# Patient Record
Sex: Male | Born: 1935 | Race: White | Hispanic: No | Marital: Married | Smoking: Former smoker
Health system: Southern US, Community
[De-identification: ages and names within clinical notes are randomized; demographics above are authoritative.]

## PROBLEM LIST (undated history)

## (undated) DIAGNOSIS — C61 Malignant neoplasm of prostate: Secondary | ICD-10-CM

## (undated) DIAGNOSIS — C801 Malignant (primary) neoplasm, unspecified: Secondary | ICD-10-CM

## (undated) DIAGNOSIS — E785 Hyperlipidemia, unspecified: Secondary | ICD-10-CM

## (undated) DIAGNOSIS — I209 Angina pectoris, unspecified: Secondary | ICD-10-CM

## (undated) DIAGNOSIS — C3490 Malignant neoplasm of unspecified part of unspecified bronchus or lung: Secondary | ICD-10-CM

## (undated) HISTORY — PX: BACK SURGERY: SHX140

## (undated) HISTORY — PX: PROSTATE SURGERY: SHX751

## (undated) HISTORY — PX: OTHER SURGICAL HISTORY: SHX169

## (undated) HISTORY — PX: CARDIAC SURGERY: SHX584

## (undated) HISTORY — PX: TUMOR REMOVAL: SHX12

---

## 1997-10-02 ENCOUNTER — Emergency Department (HOSPITAL_COMMUNITY): Admission: EM | Admit: 1997-10-02 | Discharge: 1997-10-02 | Payer: Self-pay | Admitting: Emergency Medicine

## 1997-10-02 ENCOUNTER — Encounter: Payer: Self-pay | Admitting: Emergency Medicine

## 1999-08-07 ENCOUNTER — Ambulatory Visit: Admission: RE | Admit: 1999-08-07 | Discharge: 1999-08-07 | Payer: Self-pay | Admitting: Internal Medicine

## 1999-08-07 ENCOUNTER — Encounter (INDEPENDENT_AMBULATORY_CARE_PROVIDER_SITE_OTHER): Payer: Self-pay | Admitting: Specialist

## 1999-08-17 ENCOUNTER — Ambulatory Visit (HOSPITAL_COMMUNITY): Admission: RE | Admit: 1999-08-17 | Discharge: 1999-08-17 | Payer: Self-pay | Admitting: Internal Medicine

## 1999-08-17 ENCOUNTER — Encounter (INDEPENDENT_AMBULATORY_CARE_PROVIDER_SITE_OTHER): Payer: Self-pay | Admitting: Specialist

## 1999-08-29 ENCOUNTER — Encounter: Payer: Self-pay | Admitting: Surgery

## 1999-08-29 ENCOUNTER — Ambulatory Visit (HOSPITAL_COMMUNITY): Admission: RE | Admit: 1999-08-29 | Discharge: 1999-08-29 | Payer: Self-pay | Admitting: Surgery

## 1999-09-03 ENCOUNTER — Encounter: Payer: Self-pay | Admitting: Surgery

## 1999-09-05 ENCOUNTER — Encounter (INDEPENDENT_AMBULATORY_CARE_PROVIDER_SITE_OTHER): Payer: Self-pay | Admitting: *Deleted

## 1999-09-05 ENCOUNTER — Encounter: Payer: Self-pay | Admitting: Surgery

## 1999-09-05 ENCOUNTER — Inpatient Hospital Stay (HOSPITAL_COMMUNITY): Admission: RE | Admit: 1999-09-05 | Discharge: 1999-09-12 | Payer: Self-pay | Admitting: Surgery

## 1999-09-06 ENCOUNTER — Encounter: Payer: Self-pay | Admitting: Surgery

## 1999-09-07 ENCOUNTER — Encounter: Payer: Self-pay | Admitting: Surgery

## 1999-09-08 ENCOUNTER — Encounter: Payer: Self-pay | Admitting: Surgery

## 1999-09-09 ENCOUNTER — Encounter: Payer: Self-pay | Admitting: Surgery

## 1999-09-10 ENCOUNTER — Encounter: Payer: Self-pay | Admitting: Surgery

## 1999-09-10 ENCOUNTER — Encounter: Payer: Self-pay | Admitting: Thoracic Surgery (Cardiothoracic Vascular Surgery)

## 1999-09-11 ENCOUNTER — Encounter: Payer: Self-pay | Admitting: Surgery

## 1999-09-18 ENCOUNTER — Encounter: Admission: RE | Admit: 1999-09-18 | Discharge: 1999-09-18 | Payer: Self-pay | Admitting: Surgery

## 1999-09-18 ENCOUNTER — Encounter: Payer: Self-pay | Admitting: Surgery

## 1999-10-09 ENCOUNTER — Encounter: Payer: Self-pay | Admitting: Surgery

## 1999-10-09 ENCOUNTER — Encounter: Admission: RE | Admit: 1999-10-09 | Discharge: 1999-10-09 | Payer: Self-pay | Admitting: Surgery

## 2000-01-22 ENCOUNTER — Encounter: Admission: RE | Admit: 2000-01-22 | Discharge: 2000-01-22 | Payer: Self-pay | Admitting: Surgery

## 2000-01-22 ENCOUNTER — Encounter: Payer: Self-pay | Admitting: Surgery

## 2000-02-14 ENCOUNTER — Encounter (INDEPENDENT_AMBULATORY_CARE_PROVIDER_SITE_OTHER): Payer: Self-pay | Admitting: Specialist

## 2000-02-14 ENCOUNTER — Encounter: Payer: Self-pay | Admitting: Emergency Medicine

## 2000-02-15 ENCOUNTER — Inpatient Hospital Stay (HOSPITAL_COMMUNITY): Admission: EM | Admit: 2000-02-15 | Discharge: 2000-02-19 | Payer: Self-pay | Admitting: Emergency Medicine

## 2000-02-15 ENCOUNTER — Encounter: Payer: Self-pay | Admitting: General Surgery

## 2000-03-19 ENCOUNTER — Encounter: Payer: Self-pay | Admitting: Internal Medicine

## 2000-03-19 ENCOUNTER — Ambulatory Visit (HOSPITAL_COMMUNITY): Admission: RE | Admit: 2000-03-19 | Discharge: 2000-03-19 | Payer: Self-pay | Admitting: Internal Medicine

## 2000-05-19 ENCOUNTER — Encounter: Payer: Self-pay | Admitting: Family Medicine

## 2000-05-19 ENCOUNTER — Ambulatory Visit (HOSPITAL_COMMUNITY): Admission: RE | Admit: 2000-05-19 | Discharge: 2000-05-19 | Payer: Self-pay | Admitting: Family Medicine

## 2000-07-23 ENCOUNTER — Encounter: Admission: RE | Admit: 2000-07-23 | Discharge: 2000-07-23 | Payer: Self-pay | Admitting: Surgery

## 2000-07-23 ENCOUNTER — Encounter: Payer: Self-pay | Admitting: Surgery

## 2000-09-05 ENCOUNTER — Encounter: Payer: Self-pay | Admitting: *Deleted

## 2000-09-05 ENCOUNTER — Emergency Department (HOSPITAL_COMMUNITY): Admission: EM | Admit: 2000-09-05 | Discharge: 2000-09-05 | Payer: Self-pay | Admitting: Emergency Medicine

## 2000-12-23 ENCOUNTER — Encounter: Payer: Self-pay | Admitting: Surgery

## 2000-12-23 ENCOUNTER — Encounter: Admission: RE | Admit: 2000-12-23 | Discharge: 2000-12-23 | Payer: Self-pay | Admitting: Surgery

## 2001-04-28 ENCOUNTER — Encounter: Payer: Self-pay | Admitting: Surgery

## 2001-04-28 ENCOUNTER — Encounter: Admission: RE | Admit: 2001-04-28 | Discharge: 2001-04-28 | Payer: Self-pay | Admitting: Surgery

## 2001-09-22 ENCOUNTER — Encounter: Admission: RE | Admit: 2001-09-22 | Discharge: 2001-09-22 | Payer: Self-pay | Admitting: Surgery

## 2001-09-22 ENCOUNTER — Encounter: Payer: Self-pay | Admitting: Surgery

## 2001-12-21 ENCOUNTER — Encounter (HOSPITAL_COMMUNITY): Admission: RE | Admit: 2001-12-21 | Discharge: 2002-01-20 | Payer: Self-pay | Admitting: Cardiology

## 2002-03-30 ENCOUNTER — Encounter: Payer: Self-pay | Admitting: Surgery

## 2002-03-30 ENCOUNTER — Encounter: Admission: RE | Admit: 2002-03-30 | Discharge: 2002-03-30 | Payer: Self-pay | Admitting: Surgery

## 2002-07-20 ENCOUNTER — Encounter: Admission: RE | Admit: 2002-07-20 | Discharge: 2002-07-20 | Payer: Self-pay | Admitting: Surgery

## 2002-07-20 ENCOUNTER — Encounter: Payer: Self-pay | Admitting: Surgery

## 2002-07-27 ENCOUNTER — Encounter: Admission: RE | Admit: 2002-07-27 | Discharge: 2002-07-27 | Payer: Self-pay | Admitting: Surgery

## 2002-07-27 ENCOUNTER — Encounter: Payer: Self-pay | Admitting: Surgery

## 2002-08-11 ENCOUNTER — Encounter: Payer: Self-pay | Admitting: Cardiology

## 2002-08-11 ENCOUNTER — Inpatient Hospital Stay (HOSPITAL_COMMUNITY): Admission: EM | Admit: 2002-08-11 | Discharge: 2002-08-13 | Payer: Self-pay | Admitting: Cardiology

## 2002-08-16 ENCOUNTER — Ambulatory Visit (HOSPITAL_COMMUNITY): Admission: RE | Admit: 2002-08-16 | Discharge: 2002-08-17 | Payer: Self-pay | Admitting: Cardiology

## 2003-01-11 ENCOUNTER — Encounter: Admission: RE | Admit: 2003-01-11 | Discharge: 2003-01-11 | Payer: Self-pay | Admitting: Surgery

## 2003-07-19 ENCOUNTER — Encounter: Admission: RE | Admit: 2003-07-19 | Discharge: 2003-07-19 | Payer: Self-pay | Admitting: Surgery

## 2004-01-03 ENCOUNTER — Ambulatory Visit: Payer: Self-pay | Admitting: Cardiology

## 2004-01-03 ENCOUNTER — Emergency Department (HOSPITAL_COMMUNITY): Admission: EM | Admit: 2004-01-03 | Discharge: 2004-01-04 | Payer: Self-pay | Admitting: Emergency Medicine

## 2004-01-05 ENCOUNTER — Ambulatory Visit: Payer: Self-pay | Admitting: Cardiology

## 2004-01-10 ENCOUNTER — Encounter: Admission: RE | Admit: 2004-01-10 | Discharge: 2004-01-10 | Payer: Self-pay | Admitting: Surgery

## 2004-01-26 ENCOUNTER — Ambulatory Visit: Payer: Self-pay | Admitting: Cardiology

## 2004-07-24 ENCOUNTER — Encounter: Admission: RE | Admit: 2004-07-24 | Discharge: 2004-07-24 | Payer: Self-pay | Admitting: Surgery

## 2004-10-30 ENCOUNTER — Emergency Department (HOSPITAL_COMMUNITY): Admission: EM | Admit: 2004-10-30 | Discharge: 2004-10-30 | Payer: Self-pay | Admitting: Emergency Medicine

## 2005-01-23 ENCOUNTER — Ambulatory Visit: Payer: Self-pay | Admitting: Urgent Care

## 2005-02-01 ENCOUNTER — Ambulatory Visit (HOSPITAL_COMMUNITY): Admission: RE | Admit: 2005-02-01 | Discharge: 2005-02-01 | Payer: Self-pay | Admitting: Internal Medicine

## 2005-02-01 ENCOUNTER — Ambulatory Visit: Payer: Self-pay | Admitting: Internal Medicine

## 2005-06-17 ENCOUNTER — Encounter: Admission: RE | Admit: 2005-06-17 | Discharge: 2005-06-17 | Payer: Self-pay | Admitting: Surgery

## 2005-07-30 ENCOUNTER — Encounter: Admission: RE | Admit: 2005-07-30 | Discharge: 2005-07-30 | Payer: Self-pay | Admitting: Surgery

## 2005-11-27 ENCOUNTER — Ambulatory Visit (HOSPITAL_COMMUNITY): Admission: RE | Admit: 2005-11-27 | Discharge: 2005-11-27 | Payer: Self-pay | Admitting: Family Medicine

## 2005-11-28 ENCOUNTER — Ambulatory Visit (HOSPITAL_COMMUNITY): Admission: RE | Admit: 2005-11-28 | Discharge: 2005-11-28 | Payer: Self-pay | Admitting: Family Medicine

## 2006-03-05 ENCOUNTER — Ambulatory Visit: Payer: Self-pay | Admitting: Cardiology

## 2006-03-10 ENCOUNTER — Ambulatory Visit: Payer: Self-pay | Admitting: Cardiology

## 2006-03-10 ENCOUNTER — Encounter (HOSPITAL_COMMUNITY): Admission: RE | Admit: 2006-03-10 | Discharge: 2006-04-09 | Payer: Self-pay

## 2006-03-12 ENCOUNTER — Ambulatory Visit: Payer: Self-pay | Admitting: Cardiology

## 2006-05-24 ENCOUNTER — Emergency Department (HOSPITAL_COMMUNITY): Admission: EM | Admit: 2006-05-24 | Discharge: 2006-05-24 | Payer: Self-pay | Admitting: Emergency Medicine

## 2006-07-15 ENCOUNTER — Ambulatory Visit: Payer: Self-pay | Admitting: Surgery

## 2006-07-15 ENCOUNTER — Encounter: Admission: RE | Admit: 2006-07-15 | Discharge: 2006-07-15 | Payer: Self-pay | Admitting: Surgery

## 2006-09-02 ENCOUNTER — Ambulatory Visit: Payer: Self-pay | Admitting: Cardiology

## 2006-12-14 ENCOUNTER — Inpatient Hospital Stay (HOSPITAL_COMMUNITY): Admission: EM | Admit: 2006-12-14 | Discharge: 2006-12-17 | Payer: Self-pay | Admitting: Emergency Medicine

## 2007-01-24 ENCOUNTER — Emergency Department (HOSPITAL_COMMUNITY): Admission: EM | Admit: 2007-01-24 | Discharge: 2007-01-25 | Payer: Self-pay | Admitting: *Deleted

## 2007-01-25 ENCOUNTER — Encounter: Payer: Self-pay | Admitting: Critical Care Medicine

## 2007-01-30 ENCOUNTER — Ambulatory Visit (HOSPITAL_COMMUNITY): Admission: RE | Admit: 2007-01-30 | Discharge: 2007-01-30 | Payer: Self-pay | Admitting: Internal Medicine

## 2007-02-16 ENCOUNTER — Ambulatory Visit: Payer: Self-pay | Admitting: Cardiology

## 2007-02-17 ENCOUNTER — Ambulatory Visit: Payer: Self-pay | Admitting: Surgery

## 2007-02-25 ENCOUNTER — Ambulatory Visit: Payer: Self-pay

## 2007-03-12 ENCOUNTER — Ambulatory Visit: Payer: Self-pay | Admitting: Cardiology

## 2007-03-18 ENCOUNTER — Emergency Department (HOSPITAL_COMMUNITY): Admission: EM | Admit: 2007-03-18 | Discharge: 2007-03-18 | Payer: Self-pay | Admitting: Emergency Medicine

## 2007-09-29 ENCOUNTER — Ambulatory Visit (HOSPITAL_COMMUNITY): Admission: RE | Admit: 2007-09-29 | Discharge: 2007-09-29 | Payer: Self-pay | Admitting: Internal Medicine

## 2008-02-16 ENCOUNTER — Encounter: Admission: RE | Admit: 2008-02-16 | Discharge: 2008-02-16 | Payer: Self-pay | Admitting: Surgery

## 2008-02-16 ENCOUNTER — Ambulatory Visit: Payer: Self-pay | Admitting: Surgery

## 2008-03-03 ENCOUNTER — Ambulatory Visit: Payer: Self-pay | Admitting: Cardiology

## 2008-07-18 DIAGNOSIS — E119 Type 2 diabetes mellitus without complications: Secondary | ICD-10-CM

## 2008-07-18 DIAGNOSIS — J449 Chronic obstructive pulmonary disease, unspecified: Secondary | ICD-10-CM

## 2008-07-18 DIAGNOSIS — E785 Hyperlipidemia, unspecified: Secondary | ICD-10-CM

## 2008-07-18 DIAGNOSIS — Z794 Long term (current) use of insulin: Secondary | ICD-10-CM

## 2008-07-18 DIAGNOSIS — I25810 Atherosclerosis of coronary artery bypass graft(s) without angina pectoris: Secondary | ICD-10-CM | POA: Insufficient documentation

## 2008-08-09 ENCOUNTER — Encounter (INDEPENDENT_AMBULATORY_CARE_PROVIDER_SITE_OTHER): Payer: Self-pay | Admitting: *Deleted

## 2008-08-09 LAB — CONVERTED CEMR LAB
AST: 20 units/L
Albumin: 4.4 g/dL
CO2: 24 meq/L
Calcium: 9.6 mg/dL
Chloride: 106 meq/L
Glucose, Bld: 108 mg/dL
Potassium: 4.1 meq/L
Sodium: 143 meq/L

## 2009-01-11 ENCOUNTER — Ambulatory Visit (HOSPITAL_COMMUNITY): Admission: RE | Admit: 2009-01-11 | Discharge: 2009-01-11 | Payer: Self-pay | Admitting: Internal Medicine

## 2009-02-21 ENCOUNTER — Ambulatory Visit: Payer: Self-pay | Admitting: Surgery

## 2009-02-21 ENCOUNTER — Encounter: Admission: RE | Admit: 2009-02-21 | Discharge: 2009-02-21 | Payer: Self-pay | Admitting: Surgery

## 2009-02-28 ENCOUNTER — Ambulatory Visit: Payer: Self-pay | Admitting: Surgery

## 2009-02-28 ENCOUNTER — Encounter: Admission: RE | Admit: 2009-02-28 | Discharge: 2009-02-28 | Payer: Self-pay | Admitting: Surgery

## 2009-03-17 ENCOUNTER — Ambulatory Visit (HOSPITAL_COMMUNITY): Admission: RE | Admit: 2009-03-17 | Discharge: 2009-03-17 | Payer: Self-pay | Admitting: Urology

## 2009-03-27 ENCOUNTER — Encounter (INDEPENDENT_AMBULATORY_CARE_PROVIDER_SITE_OTHER): Payer: Self-pay | Admitting: *Deleted

## 2009-03-31 ENCOUNTER — Ambulatory Visit: Payer: Self-pay | Admitting: Cardiology

## 2009-03-31 DIAGNOSIS — R079 Chest pain, unspecified: Secondary | ICD-10-CM | POA: Insufficient documentation

## 2009-03-31 DIAGNOSIS — I452 Bifascicular block: Secondary | ICD-10-CM

## 2009-06-18 ENCOUNTER — Emergency Department (HOSPITAL_COMMUNITY): Admission: EM | Admit: 2009-06-18 | Discharge: 2009-06-19 | Payer: Self-pay | Admitting: Emergency Medicine

## 2009-10-17 ENCOUNTER — Ambulatory Visit: Payer: Self-pay | Admitting: Cardiology

## 2009-10-20 ENCOUNTER — Ambulatory Visit: Payer: Self-pay | Admitting: Cardiology

## 2009-10-20 ENCOUNTER — Encounter (HOSPITAL_COMMUNITY): Admission: RE | Admit: 2009-10-20 | Discharge: 2009-11-19 | Payer: Self-pay | Admitting: Cardiology

## 2009-10-31 ENCOUNTER — Encounter: Payer: Self-pay | Admitting: Cardiology

## 2010-01-17 ENCOUNTER — Emergency Department (HOSPITAL_COMMUNITY)
Admission: EM | Admit: 2010-01-17 | Discharge: 2010-01-17 | Payer: Self-pay | Source: Home / Self Care | Admitting: Emergency Medicine

## 2010-02-04 ENCOUNTER — Emergency Department (HOSPITAL_COMMUNITY)
Admission: EM | Admit: 2010-02-04 | Discharge: 2010-02-04 | Payer: Self-pay | Source: Home / Self Care | Admitting: Emergency Medicine

## 2010-02-14 ENCOUNTER — Encounter (INDEPENDENT_AMBULATORY_CARE_PROVIDER_SITE_OTHER): Payer: Self-pay

## 2010-02-24 ENCOUNTER — Encounter: Payer: Self-pay | Admitting: Family Medicine

## 2010-02-25 ENCOUNTER — Encounter: Payer: Self-pay | Admitting: Urology

## 2010-02-25 ENCOUNTER — Encounter: Payer: Self-pay | Admitting: Surgery

## 2010-02-26 ENCOUNTER — Encounter: Payer: Self-pay | Admitting: Internal Medicine

## 2010-03-06 ENCOUNTER — Encounter
Admission: RE | Admit: 2010-03-06 | Discharge: 2010-03-06 | Payer: Self-pay | Source: Home / Self Care | Attending: Surgery | Admitting: Surgery

## 2010-03-06 ENCOUNTER — Ambulatory Visit
Admission: RE | Admit: 2010-03-06 | Discharge: 2010-03-06 | Payer: Self-pay | Source: Home / Self Care | Attending: Surgery | Admitting: Surgery

## 2010-03-06 NOTE — Letter (Signed)
Summary: Darwin Results Engineer, agricultural at [facility]  [address] [place] [address]   Phone: [phone]  Fax: [phone]      October 31, 2009 MRN: [id number]   [facility] [address] [place], [place]  [address]   Dear Mr. Whitacre,  Your test ordered by Selena Batten has been reviewed by your physician (or physician assistant) and was found to be normal or stable. Your physician (or physician assistant) felt no changes were needed at this time.  ____ Echocardiogram  __X__ Cardiac Stress Test  ____ Lab Work  ____ Peripheral vascular study of arms, legs or neck  ____ CT scan or X-ray  ____ Lung or Breathing test  ____ Other:  Please continue on current medical treatment.  Thank you.   Valera Castle, MD, F.A.C.C

## 2010-03-06 NOTE — Assessment & Plan Note (Signed)
Summary: 1 YR F/U PER CHECKOUT ON 03/03/08/TG  Medications Added GLUCOTROL 10 MG TABS (GLIPIZIDE) take 2 tabs daily SIMVASTATIN 40 MG TABS (SIMVASTATIN) take 1 tab daily NIACIN 500 MG TABS (NIACIN) take 3 tabs at night DILTIAZEM HCL ER BEADS 240 MG XR24H-CAP (DILTIAZEM HCL ER BEADS) 1 tablet by mouth once daily LANTUS 100 UNIT/ML SOLN (INSULIN GLARGINE) sliding scale ASPIR-TRIN 325 MG TBEC (ASPIRIN) take 1 tab daily NITROSTAT 0.4 MG SUBL (NITROGLYCERIN) 1 tablet under tongue at onset of chest pain; you may repeat every 5 minutes for up to 3 doses.      Allergies Added: NKDA  Visit Type:  Follow-up Primary Provider:  Dr.Fusco  CC:  chest pain wed night.  History of Present Illness: Thomas [name] returns for evaluation and management his coronary artery disease.  The other night, he had a sharp stabbing pain over his left breast. It felt like someone was driving a nail into his chest. He then had tingling in his arm. There was no shortness of breath or diaphoresis. He did not have any nitroglycerin. He came close to calling 911.  He's had no exertional chest tightness or pressure. He has chronic lung disease and has had previous resection of a lung carcinoma. He denies any hemoptysis, fever chills or productive cough. He said no pleuritic chest pain.  Denies orthopnea, PND or peripheral edema.  Current Medications (verified): 1)  Glucotrol 10 Mg Tabs (Glipizide) .... Take 2 Tabs Daily 2)  Simvastatin 40 Mg Tabs (Simvastatin) .... Take 1 Tab Daily 3)  Niacin 500 Mg Tabs (Niacin) .... Take 3 Tabs At Night 4)  Diltiazem Hcl Er Beads 240 Mg Xr24h-Cap (Diltiazem Hcl Er Beads) .Marland Kitchen.. 1 Tablet By Mouth Once Daily 5)  Lantus 100 Unit/ml Soln (Insulin Glargine) .... Sliding Scale 6)  Aspir-Trin 325 Mg Tbec (Aspirin) .... Take 1 Tab Daily 7)  Nitrostat 0.4 Mg Subl (Nitroglycerin) .Marland Kitchen.. 1 Tablet Under Tongue At Onset of Chest Pain; You May Repeat Every 5 Minutes For Up To 3 Doses.  Allergies  (verified): No Known Drug Allergies  Past History:  Past Medical History: Last updated: 07/18/2008 CAD, ARTERY BYPASS GRAFT (ICD-414.04) HYPERLIPIDEMIA-MIXED (ICD-272.4) DIABETES MELLITUS, TYPE II (ICD-250.00) COPD (ICD-496)    Past Surgical History: Last updated: 07/18/2008 CABG right thoracotomy prostatectomy Tonsillectomy Skin carcinoma removal Cholecystectomy  Social History: Last updated: 07/18/2008 Married  Tobacco Use - No.  Alcohol Use - no Drug Use - no  Risk Factors: Smoking Status: never (07/18/2008)  Review of Systems       negative other than history of present illness  Vital Signs:  Patient profile:   77 year old male Height:      71 inches Weight:      213 pounds BMI:     29.81 Pulse rate:   82 / minute BP sitting:   117 / 61  (right arm)  Vitals Entered By: Dreama Saa, CNA (March 31, 2009 1:39 PM)  Physical Exam  General:  chronically ill, overweight, very pleasant Head:  normocephalic and atraumatic Eyes:  PERRLA/EOM intact; conjunctiva and lids normal. Neck:  Neck supple, no JVD. No masses, thyromegaly or abnormal cervical nodes. Lungs:  decreased breath sounds throughout no rhonchi wheezes or rub Heart:  we appreciated PMI, regular rate and rhythm, no rub Msk:  decreased ROM.  decreased ROM.   Pulses:  pulses normal in all 4 extremities Extremities:  trace left pedal edema and trace right pedal edema.  trace left pedal edema and trace right pedal  edema.   Neurologic:  Alert and oriented x 3. Skin:  Intact without lesions or rashes. Psych:  Normal affect.   Problems:  Medical Problems Added: 1)  Dx of Chest Pain-unspecified  (ICD-786.50) 2)  Dx of Rbbb  (ICD-426.4) 3)  Dx of Chest Pain-unspecified  (ICD-786.50)  EKG  Procedure date:  03/31/2009  Findings:      normal sinus rhythm, right bundle branch block, no change  Impression & Recommendations:  Problem # 1:  CHEST PAIN-UNSPECIFIED (ICD-786.50) Assessment  New  I have reassured him of this is noncoronary. It doesn't sound pleuritic. I suspect it was muscular. Regardless, giving him several nitroglycerin use p.r.n. I'll see him back in 6 months. His updated medication list for this problem includes:    Diltiazem Hcl Er Beads 240 Mg Xr24h-cap (Diltiazem hcl er beads) .Marland Kitchen... 1 tablet by mouth once daily    Aspir-trin 325 Mg Tbec (Aspirin) .Marland Kitchen... Take 1 tab daily    Nitrostat 0.4 Mg Subl (Nitroglycerin) .Marland Kitchen... 1 tablet under tongue at onset of chest pain; you may repeat every 5 minutes for up to 3 doses.  His updated medication list for this problem includes:    Diltiazem Hcl Er Beads 240 Mg Xr24h-cap (Diltiazem hcl er beads) .Marland Kitchen... 1 tablet by mouth once daily    Aspir-trin 325 Mg Tbec (Aspirin) .Marland Kitchen... Take 1 tab daily    Nitrostat 0.4 Mg Subl (Nitroglycerin) .Marland Kitchen... 1 tablet under tongue at onset of chest pain; you may repeat every 5 minutes for up to 3 doses.  Problem # 2:  CAD, ARTERY BYPASS GRAFT (ICD-414.04) Assessment: Unchanged  His updated medication list for this problem includes:    Diltiazem Hcl Er Beads 240 Mg Xr24h-cap (Diltiazem hcl er beads) .Marland Kitchen... 1 tablet by mouth once daily    Aspir-trin 325 Mg Tbec (Aspirin) .Marland Kitchen... Take 1 tab daily    Nitrostat 0.4 Mg Subl (Nitroglycerin) .Marland Kitchen... 1 tablet under tongue at onset of chest pain; you may repeat every 5 minutes for up to 3 doses.  His updated medication list for this problem includes:    Diltiazem Hcl Er Beads 240 Mg Xr24h-cap (Diltiazem hcl er beads) .Marland Kitchen... 1 tablet by mouth once daily    Aspir-trin 325 Mg Tbec (Aspirin) .Marland Kitchen... Take 1 tab daily    Nitrostat 0.4 Mg Subl (Nitroglycerin) .Marland Kitchen... 1 tablet under tongue at onset of chest pain; you may repeat every 5 minutes for up to 3 doses.  Problem # 3:  RBBB (ICD-426.4) Assessment: Unchanged  His updated medication list for this problem includes:    Diltiazem Hcl Er Beads 240 Mg Xr24h-cap (Diltiazem hcl er beads) .Marland Kitchen... 1 tablet by mouth once  daily    Aspir-trin 325 Mg Tbec (Aspirin) .Marland Kitchen... Take 1 tab daily    Nitrostat 0.4 Mg Subl (Nitroglycerin) .Marland Kitchen... 1 tablet under tongue at onset of chest pain; you may repeat every 5 minutes for up to 3 doses.  His updated medication list for this problem includes:    Diltiazem Hcl Er Beads 240 Mg Xr24h-cap (Diltiazem hcl er beads) .Marland Kitchen... 1 tablet by mouth once daily    Aspir-trin 325 Mg Tbec (Aspirin) .Marland Kitchen... Take 1 tab daily    Nitrostat 0.4 Mg Subl (Nitroglycerin) .Marland Kitchen... 1 tablet under tongue at onset of chest pain; you may repeat every 5 minutes for up to 3 doses.  Patient Instructions: 1)  Your physician recommends that you schedule a follow-up appointment in: 6 months 2)  Your physician has recommended you make the  following change in your medication: Start taking Nitroglycerin for chest pain Prescriptions: NITROSTAT 0.4 MG SUBL (NITROGLYCERIN) 1 tablet under tongue at onset of chest pain; you may repeat every 5 minutes for up to 3 doses.  #25 x 3   Entered by:   Larita Fife Via LPN   Authorized by:   Gaylord Shih, MD, [facility]   Signed by:   Larita Fife Via LPN on 13/09/6576   Method used:   Electronically to        [facility] (retail)       [address]       [place], [place]  [address]       Ph: [phone]       Fax: [phone]   RxID:   [id number]

## 2010-03-06 NOTE — Assessment & Plan Note (Signed)
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  I last saw him February 28, 2009, at which time, we had done a CT scan of the chest to rule out any recurrent lung cancer since he was having some right lower chest and back pain.  The CT scan of chest did not show any evidence of recurrent lung cancer, but did show multiple small kidney stones which were likely recalls of his discomfort.  He said he has been fairly stable over the past year.  He does report over the past several weeks that he has had a lot of wheezing as well as some hoarseness.  He said he was seen by his primary physician Dr. Sherwood Gambler and was treated with bronchodilator and antibiotics. He said he did not feel that this was helping that much, although he does note that he is no longer coughing very much.  He denies any chest discomfort.  He has no sputum production or hemoptysis.  He denies any headaches or visual changes.  He has had some pain in his lower back which he said were related to some bone spurs which have been worked up. His appetite has been good and his weight has been stable.  PHYSICAL EXAMINATION:  Vital Signs:  Today, blood pressure is 126/73, pulse 76 and regular, respiratory rate is 24 and unlabored, oxygen saturation on room air is 95%.  General:  His voice has not sound any different from his baseline to me.  Cardiac:  Shows regular rate and rhythm with normal heart sounds.  Lungs:  Reveals expiratory wheezing. The right thoracotomy incision is well-healed.  There are no skin lesions.  There is no cervical or supraclavicular adenopathy.  Abdomen: Shows active bowel sounds.  Abdomen is soft and nontender with no palpable masses or  organomegaly.  A follow up chest x-ray today shows no evidence of any recurrent lung cancer or acute lung process.  There is chronic scarring at the right base as well as at the right apex which are unchanged.  IMPRESSION:  The patient has no evidence of recurrent cancer.  He does have significant chronic obstructive pulmonary disease and likely had some acute exacerbation responsible for his increased wheezing, cough, and hoarseness.  His cough has resolved and also means that he is going to continue improving.  He will continue to follow up with his primary physician concerning that.  I will plan to see him back in 1 year with a repeat chest x-ray.  Evelene Croon, M.D. Electronically Signed  BB/MEDQ  D:  03/06/2010  T:  03/06/2010  Job:  [id number]  cc:   Madelin Rear. Sherwood Gambler, MD

## 2010-03-06 NOTE — Miscellaneous (Signed)
Summary: labs cmp,lipids,tsh,a1c,08/09/2008  Clinical Lists Changes  Observations: Added new observation of CALCIUM: 9.6 mg/dL (25/95/6387 56:43) Added new observation of ALBUMIN: 4.4 g/dL (32/95/1884 16:60) Added new observation of PROTEIN, TOT: 7.1 g/dL (63/02/6008 93:23) Added new observation of SGPT (ALT): 18 units/L (08/09/2008 10:55) Added new observation of SGOT (AST): 20 units/L (08/09/2008 10:55) Added new observation of ALK PHOS: 55 units/L (08/09/2008 10:55) Added new observation of CREATININE: 1.09 mg/dL (55/73/2202 54:27) Added new observation of BUN: 16 mg/dL (07/28/7626 31:51) Added new observation of BG RANDOM: 108 mg/dL (76/16/0737 10:62) Added new observation of CO2 PLSM/SER: 24 meq/L (08/09/2008 10:55) Added new observation of CL SERUM: 106 meq/L (08/09/2008 10:55) Added new observation of K SERUM: 4.1 meq/L (08/09/2008 10:55) Added new observation of NA: 143 meq/L (08/09/2008 10:55) Added new observation of LDL: 52 mg/dL (69/48/5462 70:35) Added new observation of HDL: 38 mg/dL (00/93/8182 99:37) Added new observation of TRIGLYC TOT: 236 mg/dL (16/96/7893 81:01) Added new observation of CHOLESTEROL: 137 mg/dL (75/11/2583 27:78) Added new observation of TSH: 2.504 microintl units/mL (08/09/2008 10:55) Added new observation of HGBA1C: 6.3 % (08/09/2008 10:55)

## 2010-03-06 NOTE — Letter (Signed)
Summary: [facility] (Nuc Med Stress)   [facility]  [address], [place] [address]   Phone: [phone]  Fax: [phone]    Nuclear Medicine 1-Day Stress Test Information Sheet  Re:     Thomas [name]   DOB:     09-13-1927 MRN:     [id number] Weight:  Appointment Date: Register at: Appointment Time: Referring MD:  ___Exercise Stress  __Adenosine   __Dobutamine  _X_Lexiscan  __Persantine   __Thallium  Urgency: ____1 (next day)   ____2 (one week)    ____3 (PRN)  Patient will receive Follow Up call with results: Patient needs follow-up appointment:  Instructions regarding medication:  How to prepare for your stress test: 1. DO NOT eat or dring 6 hours prior to your arrival time. This includes no caffeine (coffee, tea, sodas, chocolate) if you were instructed to take your medications, drink water with it. 2. DO NOT use any tobacco products for at leaset 8 hours prior to arrival. 3. DO NOT wear dresses or any clothing that may have metal clasps or buttons. 4. Wear short sleeve shirts, loose clothing, and comfortalbe walking shoes. 5. DO NOT use lotions, oils or powder on your chest before the test. 6. The test will take approximately 3-4 hours from the time you arrive until completion. 7. To register the day of the test, go to the Short Stay entrance at [facility]. 8. If you must cancel your test, call [phone] as soon as you are aware.  After you arrive for test:   When you arrive at [facility], you will go to Short Stay to be registered. They will then send you to Radiology to check in. The Nuclear Medicine Tech will get you and start an IV in your arm or hand. A small amount of a radioactive tracer will then be injected into your IV. This tracer will then have to circulate for 30-45 minutes. During this time you will wait in the waiting room and you will be able to drink something without caffeine. A series of pictures will be taken  of your heart follwoing this waiting period. After the 1st set of pictures you will go to the stress lab to get ready for your stress test. During the stress test, another small amount of a radioactive tracer will be injected through your IV. When the stress test is complete, there is a short rest period while your heart rate and blood pressure will be monitored. When this monitoring period is complete you will have another set of pictrues taken. (The same as the 1st set of pictures). These pictures are taken between 15 minutes and 1 hour after the stress test. The time depends on the type of stress test you had. Your doctor will inform you of your test results within 7 days after test.    The possibilities of certain changes are possible during the test. They include abnormal blood pressure and disorders of the heart. Side effects of persantine or adenosine can include flushing, chest pain, shortness of breath, stomach tightness, headache and light-headedness. These side effects usually do not last long and are self-resolving. Every effort will be made to keep you comfortable and to minimize complications by obtaining a medical history and by close observation during the test. Emergency equipment, medications, and trained personnel are available to deal with any unusual situation which may arise.  Please notify office at least 48 hours in advance if you are unable to keep  this appt.

## 2010-03-06 NOTE — Letter (Signed)
Summary:  Future Lab Work Engineer, agricultural at [facility]  [address] [place] [address]   Phone: [phone]  Fax: [phone]     October 17, 2009 MRN: [id number]   NICKOLAUS BORDELON [address] [place], [place]  [address]      YOUR LAB WORK IS DUE  December 05, 2009 _________________________________________  Please go to [facility], located across the street from [facility] on the second floor.  Hours are Monday - Friday 7am until 7:30pm         Saturday 8am until 12noon    _X_  DO NOT EAT OR DRINK AFTER MIDNIGHT EVENING PRIOR TO LABWORK  __ YOUR LABWORK IS NOT FASTING --YOU MAY EAT PRIOR TO LABWORK

## 2010-03-06 NOTE — Assessment & Plan Note (Signed)
Summary: f60m  Medications Added PRAVASTATIN SODIUM 80 MG TABS (PRAVASTATIN SODIUM) take 1 tablet by mouth at bedtime LIPITOR 20 MG TABS (ATORVASTATIN CALCIUM) take 1 tablet by mouth at bedtime NIACIN 500 MG TABS (NIACIN) 4 by mouth at bedtime      Allergies Added: NKDA  Primary Dessa Ledee:  Dr.Fusco   History of Present Illness: Thomas [name] comes in today for evaluation and management of coronary artery disease. He is status post cardiac bypass surgery in the mid 90s. He's done remarkably well.  He doesn't get around very much but has had no chest pain or angina. His last objective assessment of his coronary disease was in 2008.  He is on simvastatin 40 and diltiazem. We'll have to make appropriate changes. He cannot afford a brand name.  His blood work checked by primary care.  He denies orthopnea, PND or edema. He does have some chronic dyspnea on exertion.  He states he very compliant with his medications.  Clinical Reports Reviewed:  Nuclear Study:  03/10/2006:   Clinical data:   77 year old gentleman with coronary disease and   chest pain.   ADENOSINE STRESS MYOVIEW STUDY:   RADIONUCLIDE DATA:  One day rest/stress protocol performed with   10.0/30.0 mCi Tc42m Myoview.   STRESS DATA:  Adenosine infusion resulted in an appropriate increase   in heart rate and decrease in systolic blood pressure.   EKG:  Normal sinus rhythm; right bundle branch block; extreme left   axis deviation.  No significant change with Adenosine.   SCINTIGRAPHIC DATA:  Acquisition notable for mild diaphragmatic   attenuation.  Left ventricular size was normal.  On tomographic   images reconstructed in standard planes, there was thinning of the   inferior wall that was of borderline numeric significance and for   which no reversibility was apparent.  The gated reconstruction   demonstrated normal regional and global LV systolic function as well   as normal systolic accentuation of activity  throughout.  Estimated   ejection fraction was .61.   IMPRESSION:   Negative pharmacologic stress Myoview study revealing no significant   stress - induced EKG abnormalities, normal left ventricular size, and   normal left ventricular systolic function and mild diaphragmatic   attenuation without convincing evidence for ischemia or infarction.   Other findings as noted.    Read By:  Metter Bing,  M.D.  12/21/2001:   CLINICAL DATA:  19-YEAR-OLD GENTLEMAN WITH CABG SURGERY IN   1995; MULTIPLE CARDIOVASCULAR RISK FACTORS.   ADENOSINE STRESS CARDIOLITE STUDY  RADIONUCLIDE DATA:  ONE DAY REST/STRESS PROTOCOL PERFORMED   WITH 10/30 MCI TC-95M SESTAMIBI.   STRESS DATA:  ADENOSINE INFUSION RESULTED IN MILD DYSPNEA.    THERE WAS A MODEST AND TYPICAL INCREASE IN HEART RATE AND   DECREASE IN SYSTOLIC BLOOD PRESSURE.  NO ARRHYTHMIAS   NOTED.   EKG:  NORMAL SINUS RHYTHM; RIGHT BUNDLE BRANCH BLOCK;   EXTREME RIGHT AXIS DEVIATION.  NO SIGNIFICANT CHANGE   DURING ADENOSINE ADMINISTRATION.   SCINTIGRAPHIC DATA:  ACQUISITION WAS TECHNICALLY GOOD.   LEFT VENTRICULAR SIZE WAS NORMAL.  ON RECONSTRUCTED   TOMOGRAPHIC IMAGES, THERE WAS UNIFORM AND NORMAL UPTAKE OF   TRACER  IN ALL MYOCARDIAL SEGMENTS.  THE REST  IMAGES WERE UNCHANGED.  GATED 3-D RECONSTRUCTION REVEALED   NORMAL REGIONAL AND GLOBAL LV SYSTOLIC FUNCTION WITH AN   ESTIMATED EJECTION FRACTION IN EXCESS OF .65.  THE GATED   TOMOGRAMS WERE NORMAL.   IMPRESSION:  NEGATIVE PHARMACOLOGIC STRESS  CARDIOLITE   STUDY WITH STRESS-INDUCED EKG ABNORMALITIES, NORMAL LEFT   VENTRICULAR SIZE AND FUNCTION AND NORMAL PERFUSION BY   MYOCARDIAL SCINTIGRAPHY.   ***THE CORRECT ORDERING PHYSICIAN IS DR. THOMAS WALL AND   NOT DR. Ochlocknee Bing.***   Current Medications (verified): 1)  Glucotrol 10 Mg Tabs (Glipizide) .... Take 2 Tabs Daily 2)  Pravastatin Sodium 80 Mg Tabs (Pravastatin Sodium) .... Take 1 Tablet By Mouth At Bedtime 3)  Niacin  500 Mg Tabs (Niacin) .... 4 By Mouth At Bedtime 4)  Diltiazem Hcl Er Beads 240 Mg Xr24h-Cap (Diltiazem Hcl Er Beads) .Marland Kitchen.. 1 Tablet By Mouth Once Daily 5)  Lantus 100 Unit/ml Soln (Insulin Glargine) .... Sliding Scale 6)  Aspir-Trin 325 Mg Tbec (Aspirin) .... Take 1 Tab Daily 7)  Nitrostat 0.4 Mg Subl (Nitroglycerin) .Marland Kitchen.. 1 Tablet Under Tongue At Onset of Chest Pain; You May Repeat Every 5 Minutes For Up To 3 Doses.  Allergies (verified): No Known Drug Allergies  Past History:  Past Medical History: Last updated: 07/18/2008 CAD, ARTERY BYPASS GRAFT (ICD-414.04) HYPERLIPIDEMIA-MIXED (ICD-272.4) DIABETES MELLITUS, TYPE II (ICD-250.00) COPD (ICD-496)    Past Surgical History: Last updated: 07/18/2008 CABG right thoracotomy prostatectomy Tonsillectomy Skin carcinoma removal Cholecystectomy  Social History: Last updated: 07/18/2008 Married  Tobacco Use - No.  Alcohol Use - no Drug Use - no  Risk Factors: Smoking Status: never (07/18/2008)  Review of Systems       negative other than history of present illness  Vital Signs:  Patient profile:   77 year old male Height:      71 inches Weight:      206 pounds BMI:     28.84 Pulse rate:   85 / minute Resp:     16 per minute BP sitting:   117 / 70  (left arm)  Vitals Entered By: Marrion Coy, CNA (October 17, 2009 10:08 AM)  Physical Exam  General:  obese.  elderly, in no acute distressobese.   Head:  normocephalic and atraumatic Eyes:  PERRLA/EOM intact; conjunctiva and lids normal. Neck:  Neck supple, no JVD. No masses, thyromegaly or abnormal cervical nodes. Chest Wall:  no deformities or breast masses noted Lungs:   rhonchi with some diminished breath sounds in the right lower lobe Heart:  PMI nondisplaced, regular rate and rhythm, normal S1-S2, soft systolic murmur along left sternal border. Carotids equal bilaterally without bruit Abdomen:  obese, good bowel sounds, no gross abnormalities Msk:  decreased  ROM.   Pulses:  pulses normal in all 4 extremities Extremities:  trace left pedal edema and trace right pedal edema.   Neurologic:  Alert and oriented x 3. Skin:  Intact without lesions or rashes. Psych:  Normal affect.   Impression & Recommendations:  Problem # 1:  CAD, ARTERY BYPASS GRAFT (ICD-414.04) Will schedule Myoview for objective assessment coronary disease. If no ischemia at low risk, see back in a year. His updated medication list for this problem includes:    Diltiazem Hcl Er Beads 240 Mg Xr24h-cap (Diltiazem hcl er beads) .Marland Kitchen... 1 tablet by mouth once daily    Aspir-trin 325 Mg Tbec (Aspirin) .Marland Kitchen... Take 1 tab daily    Nitrostat 0.4 Mg Subl (Nitroglycerin) .Marland Kitchen... 1 tablet under tongue at onset of chest pain; you may repeat every 5 minutes for up to 3 doses.  Problem # 2:  RBBB (ICD-426.4) Assessment: Unchanged  His updated medication list for this problem includes:    Diltiazem Hcl Er Beads 240 Mg Xr24h-cap (Diltiazem  hcl er beads) .Marland Kitchen... 1 tablet by mouth once daily    Aspir-trin 325 Mg Tbec (Aspirin) .Marland Kitchen... Take 1 tab daily    Nitrostat 0.4 Mg Subl (Nitroglycerin) .Marland Kitchen... 1 tablet under tongue at onset of chest pain; you may repeat every 5 minutes for up to 3 doses.  Problem # 3:  HYPERLIPIDEMIA-MIXED (ICD-272.4) Will switch to pravastatin 80 mg a day. Followup blood work in 6-8 weeks. His updated medication list for this problem includes:    Pravastatin Sodium 80 Mg Tabs (Pravastatin sodium) .Marland Kitchen... Take 1 tablet by mouth at bedtime    Niacin 500 Mg Tabs (Niacin) .Marland KitchenMarland KitchenMarland KitchenMarland Kitchen 4 by mouth at bedtime  Future Orders: T-Lipid Profile ([id number]) ... 12/05/2009 T-Hepatic Function [id number]) ... 12/05/2009  Problem # 4:  DIABETES MELLITUS, TYPE II (ICD-250.00) Assessment: Unchanged  His updated medication list for this problem includes:    Glucotrol 10 Mg Tabs (Glipizide) .Marland Kitchen... Take 2 tabs daily    Lantus 100 Unit/ml Soln (Insulin glargine) ..... Sliding scale    Aspir-trin 325  Mg Tbec (Aspirin) .Marland Kitchen... Take 1 tab daily  Problem # 5:  COPD (ICD-496) Assessment: Unchanged  Other Orders: Nuclear Stress Test (Nuc Stress Test)  Patient Instructions: 1)  Your physician recommends that you schedule a follow-up appointment in: 1 year 2)  Your physician recommends that you return for lab work in: 6 to 8 weeks 3)  Your physician has recommended you make the following change in your medication: Stop taking Simvastatin and start taking Pravastatin 80mg  by mouth at bedtime  4)  Your physician has requested that you have an [employer].  For further information please visit [email or web address]  Please follow instruction sheet, as given. Prescriptions: PRAVASTATIN SODIUM 80 MG TABS (PRAVASTATIN SODIUM) take 1 tablet by mouth at bedtime  #30 x 6   Entered by:   Larita Fife Via LPN   Authorized by:   Gaylord Shih, MD, [facility]   Signed by:   Larita Fife Via LPN on 52/84/1324   Method used:   Electronically to        [facility] (retail)       [address]       [place], [place]  [address]       Ph: [phone]       Fax: [phone]   RxID:   [id number] LIPITOR 20 MG TABS (ATORVASTATIN CALCIUM) take 1 tablet by mouth at bedtime  #30 x 6   Entered by:   Larita Fife Via LPN   Authorized by:   Gaylord Shih, MD, [facility]   Signed by:   Larita Fife Via LPN on 08/04/1599   Method used:   Electronically to        [facility] (retail)       [address]       [place], [place]  [address]       Ph: [phone]       Fax: [phone]   RxID:   [id number]

## 2010-03-08 NOTE — Letter (Signed)
Summary: Recall Colonoscopy/Endoscopy, Change to Office Visit  [facility]  [address]   [place], [place] [address]   Phone: [phone]  Fax: [phone]      February 14, 2010   TAAVI HOOSE [address] [place], [place]  [address] December 17, 1927   Dear Mr. Trefz,   According to our records, it is time for you to schedule a Colonoscopy/Endoscopy. However, after reviewing your medical record, we recommend an office visit in order to determine your need for a repeat procedure.  Please call [phone] at your convenience to schedule an office visit. If you have any questions or concerns, please feel free to contact our office.   Sincerely,   Cloria Spring LPN  [facility] Ph: [phone]   Fax: [phone]

## 2010-04-12 ENCOUNTER — Other Ambulatory Visit (INDEPENDENT_AMBULATORY_CARE_PROVIDER_SITE_OTHER): Payer: Self-pay | Admitting: Internal Medicine

## 2010-04-12 ENCOUNTER — Encounter (HOSPITAL_BASED_OUTPATIENT_CLINIC_OR_DEPARTMENT_OTHER): Payer: Medicare Other | Admitting: Internal Medicine

## 2010-04-12 ENCOUNTER — Ambulatory Visit (HOSPITAL_COMMUNITY)
Admission: RE | Admit: 2010-04-12 | Discharge: 2010-04-12 | Disposition: A | Discharge status: Home / Self Care | Payer: Medicare Other | Source: Ambulatory Visit | Attending: Internal Medicine | Admitting: Internal Medicine

## 2010-04-12 DIAGNOSIS — Z7982 Long term (current) use of aspirin: Secondary | ICD-10-CM | POA: Insufficient documentation

## 2010-04-12 DIAGNOSIS — Z860100 Personal history of colon polyps, unspecified: Secondary | ICD-10-CM | POA: Insufficient documentation

## 2010-04-12 DIAGNOSIS — Z09 Encounter for follow-up examination after completed treatment for conditions other than malignant neoplasm: Secondary | ICD-10-CM | POA: Insufficient documentation

## 2010-04-12 DIAGNOSIS — D126 Benign neoplasm of colon, unspecified: Secondary | ICD-10-CM

## 2010-04-12 DIAGNOSIS — Z951 Presence of aortocoronary bypass graft: Secondary | ICD-10-CM | POA: Insufficient documentation

## 2010-04-12 DIAGNOSIS — K644 Residual hemorrhoidal skin tags: Secondary | ICD-10-CM | POA: Insufficient documentation

## 2010-04-12 DIAGNOSIS — Z800 Family history of malignant neoplasm of digestive organs: Secondary | ICD-10-CM

## 2010-04-12 DIAGNOSIS — E785 Hyperlipidemia, unspecified: Secondary | ICD-10-CM | POA: Insufficient documentation

## 2010-04-12 DIAGNOSIS — K5730 Diverticulosis of large intestine without perforation or abscess without bleeding: Secondary | ICD-10-CM | POA: Insufficient documentation

## 2010-04-12 DIAGNOSIS — Z1211 Encounter for screening for malignant neoplasm of colon: Secondary | ICD-10-CM

## 2010-04-12 DIAGNOSIS — Z794 Long term (current) use of insulin: Secondary | ICD-10-CM | POA: Insufficient documentation

## 2010-04-12 DIAGNOSIS — E119 Type 2 diabetes mellitus without complications: Secondary | ICD-10-CM | POA: Insufficient documentation

## 2010-04-12 LAB — GLUCOSE, CAPILLARY: Glucose-Capillary: 163 mg/dL — ABNORMAL HIGH (ref 70–99)

## 2010-04-16 LAB — DIFFERENTIAL
Basophils Absolute: 0 10*3/uL (ref 0.0–0.1)
Basophils Relative: 0 % (ref 0–1)
Eosinophils Absolute: 0.3 10*3/uL (ref 0.0–0.7)
Eosinophils Absolute: 0.3 10*3/uL (ref 0.0–0.7)
Eosinophils Relative: 4 % (ref 0–5)
Eosinophils Relative: 3 % (ref 0–5)
Lymphocytes Relative: 25 % (ref 12–46)
Lymphocytes Relative: 21 % (ref 12–46)
Lymphs Abs: 1.7 10*3/uL (ref 0.7–4.0)
Monocytes Absolute: 0.6 10*3/uL (ref 0.1–1.0)
Monocytes Absolute: 0.6 10*3/uL (ref 0.1–1.0)

## 2010-04-16 LAB — CBC
HCT: 39.9 % (ref 39.0–52.0)
Hemoglobin: 14.4 g/dL (ref 13.0–17.0)
MCH: 32.4 pg (ref 26.0–34.0)
MCH: 32.7 pg (ref 26.0–34.0)
MCHC: 36.3 g/dL — ABNORMAL HIGH (ref 30.0–36.0)
MCV: 89.9 fL (ref 78.0–100.0)
Platelets: 163 10*3/uL (ref 150–400)
Platelets: 121 10*3/uL — ABNORMAL LOW (ref 150–400)
RDW: 13.8 % (ref 11.5–15.5)
RDW: 13.6 % (ref 11.5–15.5)
WBC: 8.1 10*3/uL (ref 4.0–10.5)

## 2010-04-16 LAB — BASIC METABOLIC PANEL
BUN: 14 mg/dL (ref 6–23)
Calcium: 9.5 mg/dL (ref 8.4–10.5)
Chloride: 107 mEq/L (ref 96–112)
Creatinine, Ser: 1.03 mg/dL (ref 0.4–1.5)
GFR calc Af Amer: >60 mL/min (ref >60)
GFR calc non Af Amer: >60 mL/min (ref >60)
Glucose, Bld: 280 mg/dL — ABNORMAL HIGH (ref 70–99)
Glucose, Bld: 151 mg/dL — ABNORMAL HIGH (ref 70–99)
Potassium: 3.6 mEq/L (ref 3.5–5.1)
Sodium: 137 mEq/L (ref 135–145)

## 2010-04-16 LAB — POCT CARDIAC MARKERS: CKMB, poc: 1.1 ng/mL (ref 1.0–8.0)

## 2010-04-16 LAB — BRAIN NATRIURETIC PEPTIDE: Pro B Natriuretic peptide (BNP): <30 pg/mL (ref 0.0–100.0)

## 2010-04-16 LAB — LACTIC ACID, PLASMA: Lactic Acid, Venous: 1.9 mmol/L (ref 0.5–2.2)

## 2010-04-22 ENCOUNTER — Emergency Department (HOSPITAL_COMMUNITY)
Admission: EM | Admit: 2010-04-22 | Discharge: 2010-04-22 | Disposition: A | Discharge status: Home / Self Care | Payer: No Typology Code available for payment source | Attending: Emergency Medicine | Admitting: Emergency Medicine

## 2010-04-22 ENCOUNTER — Emergency Department (HOSPITAL_COMMUNITY): Payer: No Typology Code available for payment source

## 2010-04-22 DIAGNOSIS — Z8546 Personal history of malignant neoplasm of prostate: Secondary | ICD-10-CM | POA: Insufficient documentation

## 2010-04-22 DIAGNOSIS — Z85118 Personal history of other malignant neoplasm of bronchus and lung: Secondary | ICD-10-CM | POA: Insufficient documentation

## 2010-04-22 DIAGNOSIS — Z951 Presence of aortocoronary bypass graft: Secondary | ICD-10-CM | POA: Insufficient documentation

## 2010-04-22 DIAGNOSIS — R109 Unspecified abdominal pain: Secondary | ICD-10-CM | POA: Insufficient documentation

## 2010-04-22 DIAGNOSIS — M545 Low back pain, unspecified: Secondary | ICD-10-CM | POA: Insufficient documentation

## 2010-04-22 DIAGNOSIS — Z794 Long term (current) use of insulin: Secondary | ICD-10-CM | POA: Insufficient documentation

## 2010-04-22 DIAGNOSIS — Z79899 Other long term (current) drug therapy: Secondary | ICD-10-CM | POA: Insufficient documentation

## 2010-04-22 DIAGNOSIS — S32009A Unspecified fracture of unspecified lumbar vertebra, initial encounter for closed fracture: Secondary | ICD-10-CM | POA: Insufficient documentation

## 2010-04-22 DIAGNOSIS — E119 Type 2 diabetes mellitus without complications: Secondary | ICD-10-CM | POA: Insufficient documentation

## 2010-04-22 DIAGNOSIS — Y92410 Unspecified street and highway as the place of occurrence of the external cause: Secondary | ICD-10-CM | POA: Insufficient documentation

## 2010-04-22 DIAGNOSIS — I2510 Atherosclerotic heart disease of native coronary artery without angina pectoris: Secondary | ICD-10-CM | POA: Insufficient documentation

## 2010-04-23 LAB — DIFFERENTIAL
Eosinophils Relative: 3 % (ref 0–5)
Lymphocytes Relative: 27 % (ref 12–46)
Lymphs Abs: 1.7 10*3/uL (ref 0.7–4.0)
Monocytes Absolute: 0.6 10*3/uL (ref 0.1–1.0)
Monocytes Relative: 9 % (ref 3–12)
Neutro Abs: 3.8 10*3/uL (ref 1.7–7.7)

## 2010-04-23 LAB — URINALYSIS, ROUTINE W REFLEX MICROSCOPIC
Bilirubin Urine: NEGATIVE
Glucose, UA: 100 mg/dL — AB
Hgb urine dipstick: NEGATIVE
Nitrite: NEGATIVE
Specific Gravity, Urine: 1.025 (ref 1.005–1.030)
pH: 6 (ref 5.0–8.0)

## 2010-04-23 LAB — BASIC METABOLIC PANEL
Calcium: 9.6 mg/dL (ref 8.4–10.5)
GFR calc Af Amer: >60 mL/min (ref >60)
GFR calc non Af Amer: >60 mL/min (ref >60)
Potassium: 3.9 mEq/L (ref 3.5–5.1)
Sodium: 140 mEq/L (ref 135–145)

## 2010-04-23 LAB — CBC
HCT: 40.3 % (ref 39.0–52.0)
Hemoglobin: 14.3 g/dL (ref 13.0–17.0)
RBC: 4.42 MIL/uL (ref 4.22–5.81)

## 2010-05-07 NOTE — Op Note (Signed)
  NAME:  Thomas [name], Thomas [name]              ACCOUNT NO.:  [id number]  MEDICAL RECORD NO.:  [id number]           PATIENT TYPE:  O  LOCATION:  DAYP                          FACILITY:  [facility]  PHYSICIAN:  Lionel December, M.D.    DATE OF BIRTH:  10/15/1927  DATE OF PROCEDURE:  04/12/2010 DATE OF DISCHARGE:                              OPERATIVE REPORT   PROCEDURE:  Colonoscopy.  INDICATION:  [name] is an 77 year old Caucasian male with history of colonic polyps or adenomas and here for surveillance colonoscopy.  He is having some difficulty of constipation and doing better with MiraLax. His last colonoscopy was in 2006, and one prior to that was in 2001. Procedures were reviewed with the patient.  Informed consent was obtained.  MEDS FOR CONSCIOUS SEDATION: 1. Demerol 50 mg IV. 2. Versed 3 mg IV.  FINDINGS:  Procedure performed in endoscopy suite.  The patient's vital signs and O2 sat were monitored during the procedure and remained stable.  The patient was placed in left lateral recumbent position. Rectal examination performed.  No abnormality noted on external or digital exam.  Pentax videoscope was placed through rectum and advanced under vision into sigmoid colon and beyond.  Preparation was satisfactory except cecum, he had thick stool coating the mucosa.  The sigmoid colon was noncompliant with scattered diverticula.  There was a 3-mm polyp in this area which was seen on the way, but not on the way out.  Scope was passed into cecum which was identified by ileocecal valve and appendiceal orifice.  There was a small polyp at cecum which was ablated via cold biopsy and another one at hepatic flexure and both of these were submitted in one container.  As the scope was withdrawn, rest of the colonic mucosa was carefully examined and no other abnormalities were noted.  Tiny polyp that was seen on the way in and the sigmoid colon could not be found on the way out.  Rectal mucosa was normal.   Scope was retroflexed to examine anorectal junction and small hemorrhoids noted below the dentate line.  Endoscope was straightened and withdrawn.  Withdrawal time was 12 minutes.  The patient tolerated the procedure well.  FINAL DIAGNOSES: 1. Examination performed to cecum. 2. Scattered diverticula at sigmoid colon. 3. Two small polyps ablated via cold biopsy and submitted in one     container (cecum and hepatic flexure).  Another tiny polyp at     sigmoid colon could not be found on the way out. 4. Small external hemorrhoids.  RECOMMENDATIONS:  He will resume his usual meds including Asa.  Should continue with high-fiber diet and continue on MiraLax half to one scoop daily.  I will be contacting the patient with results of biopsy.     Lionel December, M.D.     NR/MEDQ  D:  04/12/2010  T:  04/12/2010  Job:  [id number]  cc:   Madelin Rear. Sherwood Gambler, MD Fax: [phone]  Electronically Signed by Lionel December M.D. on 05/07/2010 10:29:48 AM

## 2010-05-14 ENCOUNTER — Ambulatory Visit (HOSPITAL_COMMUNITY): Payer: No Typology Code available for payment source

## 2010-05-14 ENCOUNTER — Observation Stay (HOSPITAL_COMMUNITY)
Admission: RE | Admit: 2010-05-14 | Discharge: 2010-05-14 | Disposition: A | Discharge status: Home / Self Care | Payer: No Typology Code available for payment source | Source: Ambulatory Visit | Attending: Neurological Surgery | Admitting: Neurological Surgery

## 2010-05-14 DIAGNOSIS — M810 Age-related osteoporosis without current pathological fracture: Secondary | ICD-10-CM | POA: Insufficient documentation

## 2010-05-14 DIAGNOSIS — I2510 Atherosclerotic heart disease of native coronary artery without angina pectoris: Secondary | ICD-10-CM | POA: Insufficient documentation

## 2010-05-14 DIAGNOSIS — E119 Type 2 diabetes mellitus without complications: Secondary | ICD-10-CM | POA: Insufficient documentation

## 2010-05-14 DIAGNOSIS — Z9861 Coronary angioplasty status: Secondary | ICD-10-CM | POA: Insufficient documentation

## 2010-05-14 DIAGNOSIS — M8448XA Pathological fracture, other site, initial encounter for fracture: Principal | ICD-10-CM | POA: Insufficient documentation

## 2010-05-14 LAB — CBC
HCT: 40.9 % (ref 39.0–52.0)
Hemoglobin: 13.7 g/dL (ref 13.0–17.0)
MCH: 31.1 pg (ref 26.0–34.0)
MCHC: 33.5 g/dL (ref 30.0–36.0)
MCV: 92.7 fL (ref 78.0–100.0)
Platelets: 166 10*3/uL (ref 150–400)
RBC: 4.41 MIL/uL (ref 4.22–5.81)
RDW: 13.8 % (ref 11.5–15.5)
WBC: 5.9 10*3/uL (ref 4.0–10.5)

## 2010-05-14 LAB — SURGICAL PCR SCREEN
MRSA, PCR: NEGATIVE
Staphylococcus aureus: NEGATIVE

## 2010-05-14 LAB — COMPREHENSIVE METABOLIC PANEL
ALT: 20 U/L (ref 0–53)
AST: 24 U/L (ref 0–37)
Albumin: 3.7 g/dL (ref 3.5–5.2)
Alkaline Phosphatase: 73 U/L (ref 39–117)
BUN: 15 mg/dL (ref 6–23)
CO2: 27 mEq/L (ref 19–32)
Calcium: 9.4 mg/dL (ref 8.4–10.5)
Chloride: 110 mEq/L (ref 96–112)
Creatinine, Ser: 1.07 mg/dL (ref 0.4–1.5)
GFR calc Af Amer: >60 mL/min (ref >60)
GFR calc non Af Amer: >60 mL/min (ref >60)
Glucose, Bld: 101 mg/dL — ABNORMAL HIGH (ref 70–99)
Potassium: 4.3 mEq/L (ref 3.5–5.1)
Sodium: 142 mEq/L (ref 135–145)
Total Bilirubin: 0.7 mg/dL (ref 0.3–1.2)
Total Protein: 6.4 g/dL (ref 6.0–8.3)

## 2010-05-14 LAB — GLUCOSE, CAPILLARY: Glucose-Capillary: 66 mg/dL — ABNORMAL LOW (ref 70–99)

## 2010-05-15 ENCOUNTER — Other Ambulatory Visit: Payer: Self-pay | Admitting: Cardiology

## 2010-05-15 LAB — GLUCOSE, CAPILLARY: Glucose-Capillary: 117 mg/dL — ABNORMAL HIGH (ref 70–99)
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
## 2010-06-19 NOTE — Assessment & Plan Note (Signed)
[facility]                       [facility] OFFICE NOTE   NAME:Thomas, VANG [name]                     MRN:          [id number]  DATE:03/03/2008                            DOB:          1928-01-27    [name] comes in today for followup.   PROBLEM LIST:  1. Coronary artery disease.  He is status post coronary bypass      grafting 1995.  Last stress Myoview showed EF of 64% with no      ischemia in January 2009.  2. Hyperlipidemia.  He is currently on simvastatin 40 nightly and his      blood work is followed by Dr. Sherwood Gambler.  3. Type 2 diabetes followed by Dr. Sherwood Gambler.  He is currently insulin-      dependent.  4. COPD.  He stopped smoking and has been a survivor of lung cancer.   He really has no complaints today.  He has gained some weight.  He  denies any angina.  He has no palpitations, which had been a real  problem in the past.   CURRENT MEDICATIONS:  1. Glucotrol 20 mg per day.  2. Simvastatin 40 mg nightly.  3. Niacin 500 mg 3 nightly.  4. Diltiazem unknown dose daily.  5. Lantus 35 units daily.  6. Aspirin 325 mg a day.   PHYSICAL EXAMINATION:  VITAL SIGNS:  Today, his blood pressure is  120/60, his pulse 68 and regular, his weight is 213.  HEENT:  Normal.  NECK:  Carotid upstrokes were equal bilaterally without bruits.  No  thyromegaly.  Trachea is midline.  No lymphadenopathy.  LUNGS:  Clear to  auscultation and percussion.  HEART:  Soft S1, S2.  PMI could not be appreciated.  ABDOMEN:  Obese with good bowel sounds.  EXTREMITIES:  1+ edema.  Pulses are intact.  NEUROLOGIC:  Grossly intact.   Thomas [name] is doing well.  I asked him to return in a year.     Thomas C. Daleen Squibb, MD, [facility]  Electronically Signed    TCW/MedQ  DD: 03/03/2008  DT: 03/03/2008  Job #: [id number]

## 2010-06-19 NOTE — H&P (Signed)
NAME:  Thomas [name], Thomas [name]              ACCOUNT NO.:  [id number]   MEDICAL RECORD NO.:  [id number]          PATIENT TYPE:  INP   LOCATION:  A228                          FACILITY:  [facility]   PHYSICIAN:  Marcello Moores, MD   DATE OF BIRTH:  1927/03/26   DATE OF ADMISSION:  12/14/2006  DATE OF DISCHARGE:  LH                              HISTORY & PHYSICAL   PRIMARY MEDICAL DOCTOR:  Dr. Sherwood [name].   CHIEF COMPLAINT:  Fever and cough of three days' duration.   HISTORY OF PRESENT ILLNESS:  Thomas [name] is a 77 year old man who has  history of coronary artery disease status post CABG in 1995 and cardiac  catheterization in 2004 and a history of prostatic cancer status post  prostatectomy and history of lung CA status post right thoracotomy and  history of skin cancer and history of abdominal aneurysm status post  repair, COPD, diabetes mellitus.  He came with fever and cough for 3  days' duration.  The patient stated that for the last 3 days he has  fever with frequent cough with whitish to yellowish sputum production.  The patient stated that he has no significant chest pain, and he has not  any abdominal complaints.  He has not any urinary complaints.  As per  the patient, the fever was low-grade fever but persisted, and with this  irritating cough,he decided to present to the emergency room.  The last  time he visiting his PMD was 4 weeks ago, he was relatively in his usual  health at that time.  Associated with his fever and cough, he has very  mild shortness of breath.   REVIEW OF SYSTEMS:  Ten-point review of systems is as detailed in the  HPI, otherwise noncontributory.   ALLERGIES:  NO KNOWN DRUG ALLERGIES.   SOCIAL HISTORY:  He is married and lives in [place] with his wife.  Denied any smoking or any drug or alcohol abuse.   PAST MEDICAL HISTORY:  1. Coronary artery disease status post CABG and cardiac      catheterization.  2. History of lung CA status post right thoracotomy.  3.  History of prostate CA status post prostatectomy.  4. Diabetes mellitus, insulin dependent.  5. History of abdominal aneurysm status post repair.  6. History of COPD.   SURGICAL HISTORY:  1. Cholecystectomy.  2. Tonsillectomy.  3. Skin carcinoma removal.  4. The above as well.   HOME MEDICATIONS:  1. Glucotrol, unspecified dose.  2. Lantus, unspecified dose.  3. Alprazolam, unspecified dose.  4. Simvastatin unspecified dose.  5. Aspirin, unspecified dose.  6. Hydrocodone/acetaminophen, unspecified dose.   The patient will ask his wife to bring the bottles of his medications  and will see the dose and frequency.   On physical examination, the patient is lying in the bed in the  emergency room with frequent coughing and productive white to yellowish  sputum, and he is also apparently shivering.  VITAL SIGNS:  Temperature is 100.8, and the blood pressure is 110/50 and  pulse is 101, respiratory rate 20 and saturation 97% on oxygen via the  nasal cannula.  HEENT:  He has injected conjunctivae.  Nonicteric sclerae.  NECK:  Is supple.  CHEST:  He has air entry bilaterally with rhonchi and some crackles on  the right side.  CARDIOVASCULAR SYSTEM:  S1-S2 regular.  No murmur, slightly  tachycardiac.  ABDOMEN:  Is soft.  No area of tenderness.  EXTREMITIES:  Multiple surgical scars.  EXTREMITIES:  No pedal edema.  CENTRAL NERVOUS SYSTEM:  He is alert and fairly oriented.   On the labs, white blood cells is 24.5, hemoglobin is 15, hematocrit is  44 and neutrophil is 79%.  On the chemistry, sodium is 135, potassium  3.9, chloride is 104, bicarb is 25, glucose 164, BUN 23, creatinine 1.6.  Urinalysis is basically negative for infection.  CAT scan of the abdomen  was done and showed right lower lobe pneumonia posterior medially and  hypodense lesion of the right kidney - most likely cyst, and some  atherosclerosis, but there is no visible renal calculi.   ASSESSMENT:  1. Pneumonia,  community-acquired.  The patient lives at home and will      admit him and put him on Rocephin and Zithromax.  Will send blood      culture and will send sputum culture as well and will put him on      oxygen and continuous pulse oximetry to keep his saturation and      will do ABG as well, and our further treatment will depend on his      clinical response to this initial management, and the patient will      be on DVT and GI prophylaxis, and his other multiple history of      malignancies and medical problems for now, he has not any acute      issues and will continue his home medications.  2. Diabetes mellitus, fairly controlled, and will put him on CBG with      sliding scale and will monitor accordingly.      Marcello Moores, MD  Electronically Signed     MT/MEDQ  D:  12/14/2006  T:  12/15/2006  Job:  [id number]

## 2010-06-19 NOTE — Assessment & Plan Note (Signed)
[facility]                            CARDIOLOGY OFFICE NOTE   NAME:Thomas [name], Thomas [name]                     MRN:          [id number]  DATE:09/02/2006                            DOB:          October 06, 1927    Mr. Thomas [name] comes today to reestablish with me as his cardiologist.  I  have seen him in the past.   PROBLEM LIST:  1. Coronary artery disease status post coronary artery bypass grafting      in 1995.  He subsequently was found to have a 90% lesion in his      saphenous vein graft to the circumflex requiring a drug-eluding      stent in 2004.  He had a stress Myoview on follow up with Dr.      Dietrich Pates in December of 2008.  This showed normal left ventricular      chamber size, normal systolic function, ejection fraction of 61%,      mild diaphragmatic attenuation without any significant evidence of      ischemia or infarction.  2. Hyperlipidemia.  Dr. Dietrich Pates changed from pravastatin 40 to      simvastatin 40 in January.  His follow-up lipids showed his      cholesterol to be 147, triglycerides 152 per his recollection.  I      suspect his LDL was around 70.  This is much improved over      pravastatin.  He is also on niacin 1500 mg a day.  3. Type 2 diabetes, currently insulin dependent.  He is being followed      by Dr. Phillips Odor and Dr. Sherwood Gambler.   His biggest complaint is that of just feeling lightheaded, dizzy and not  steady with his gait, like he is drunk.  This has been evaluated by  Dr. Phillips Odor, ear, nose and throat, and a head CT per the patient.  These  were all negative.   He denies any true orthostatic symptoms, though on occasion he does feel  lightheaded and somewhat giddy when he gets up and around.   CURRENT MEDICATIONS:  1. Glipizide 10 mg b.i.d.  2. Lantus 30 units q.h.s.  3. Simvastatin 40 mg daily.  4. Niacin 1500 mg daily.   PHYSICAL EXAMINATION:  VITAL SIGNS:  His blood pressure today is 111/64,  pulse 71 and regular.   Standing, it went to 119/69, pulse 85 and  regular.  At 5 minutes, it was 126/72, heart rate of 82 and regular.  He  did not get symptomatic.  HEENT:  Normocephalic and atraumatic.  Pupils are equal, round and  reactive to light and accommodation.  Extraocular movements are intact.  Sclerae are clear.  He has somewhat deliberate speech.  He is alert and  oriented x3.  Neurologic exam is grossly intact otherwise.  NECK:  Carotids are full.  There is no JVD.  Thyroid is not enlarged.  There are no carotid bruits.  LUNGS:  Clear to auscultation and percussion.  HEART:  A non-displaced but fully appreciated PMI.  Sternotomy sight is  stable.  Normal S1 and S2.  ABDOMEN:  Soft with good bowel sounds.  No midline bruit.  There is no  hepatomegalia.  EXTREMITIES:  No cyanosis or clubbing, but there is trace edema.  Pulses  are intact, bilaterally symmetrical.  SKIN:  A few ecchymoses.   ASSESSMENT AND PLAN:  Mr. Thomas [name] is doing well from a cardiovascular  standpoint.  I have made no change in his medical program.  I have  advised him to stay well hydrated.  Much of his symptoms may be diabetic  neuropathy or occasionally orthostatic hypotension.   Will plan on seeing him back in February of 2009.     Thomas C. Daleen Squibb, MD, [facility]  Electronically Signed    TCW/MedQ  DD: 09/02/2006  DT: 09/03/2006  Job #: [id number]   cc:   Dr. Phillips Odor

## 2010-06-19 NOTE — Assessment & Plan Note (Signed)
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  I last saw him 1 year ago on February 17, 2007.  He said that over  the past year, he has had no changes in his medical condition.  He does  have some shortness of breath with exertion, which is unchanged and some  wheezing when he lays down at night.  He has had no headache or visual  changes.  He has noticed no unusual lumps or bumps.  He denies any  cough, sputum production, or hemoptysis.  He has had no bone pain.   PHYSICAL EXAMINATION:  VITAL SIGNS:  Today, his blood pressure is  122/69, his pulse 72 and regular.  Respiratory rate is 18 and unlabored.  Oxygen saturation on room air is 94%.  GENERAL:  He looks well.  CARDIAC:  A regular rate and rhythm with normal heart sounds.  LUNGS:  Decreased breath sounds throughout consistent with his  significant COPD.  There is a slight expiratory wheeze.  ABDOMEN:  Active bowel sounds.  His abdomen is soft and nontender.  There are no palpable masses or organomegaly.  NECK:  There is no cervical, supraclavicular, or axillary adenopathy.   Followup chest x-ray shows postsurgical changes in the right hemithorax,  which are unchanged with no evidence of recurrent cancer.   IMPRESSION:  The patient is now 8-1/2 years out from lung cancer  surgery.  He has no evidence of recurrent cancer.  I recommended that we  continue to follow him yearly with a chest  x-ray.  I will plan to see him back in 1 year.  He continues to follow  up with Dr. Valera Castle for his cardiac care and Dr. Elfredia Nevins for  his primary care.   Evelene Croon, M.D.  Electronically Signed   BB/MEDQ  D:  02/16/2008  T:  02/16/2008  Job:  [id number]   cc:   Thomas C. Daleen Squibb,  MD, [facility]  Madelin Rear. Sherwood Gambler, MD

## 2010-06-19 NOTE — Discharge Summary (Signed)
NAME:  Thomas [name], Thomas [name]              ACCOUNT NO.:  [id number]   MEDICAL RECORD NO.:  [id number]          PATIENT TYPE:  INP   LOCATION:  A228                          FACILITY:  [facility]   PHYSICIAN:  Dorris Singh, DO    DATE OF BIRTH:  1927-02-06   DATE OF ADMISSION:  12/14/2006  DATE OF DISCHARGE:  11/12/2008LH                               DISCHARGE SUMMARY   ADMISSION DIAGNOSES:  1. Community acquired pneumonia.  2. Diabetes mellitus.   DISCHARGE DIAGNOSES:  1. Pneumonia.  2. Diabetes, type 2.  3. Hyperlipidemia.  4. History of lung cancer.   PRIMARY CARE PHYSICIAN:  Madelin Rear. Sherwood Gambler, MD.   CONSULTATIONS:  There were no consults on his care today.   He was admitted to the service of [facility].   He had a CT of the abdomen and pelvis with contrast.  Impression of CT  of the chest was right lower lobe pneumonia, posteromedially.  Given the  patient's history of lung cancer, follow-up is recommended.  Hypodense  lesions of the right kidney present cysts.  Atherosclerosis prior  retroperitoneal node disease, no renal calculi or hydronephrosis.  His  pelvis was prior prostatectomy and pelvic lymph node dissection.  No  pelvic findings.  No findings of distal ureteral calculus.   HISTORY AND PHYSICAL:  Please refer to that done by Dr. Benson Setting.  To  summarize, patient is a 77 year old man who presented with a fever and  cough, was then admitted to the service of [facility] for pneumonia and  he was started on Rocephin and Zithromax and he continued to progress.  He was given breathing treatments and put on DVT and GI prophylaxis.  Also, he was put on the glycemic control protocol to keep his diabetes  under control.  He continued to improve on a daily basis without  incident.  On December 17, 2006, it was determined the patient could be  discharged to home.  His white count was also getting better.   He will be sent home on his home medications which include  1. Glipizide 10 mg  twice a day.  2. Lantus 30 units at bedtime.  3. Alprazolam 1 mg four times a day.  4. Simvastatin 40 mg daily.  5. Aspirin 325 mg daily.  6. Hydrocodone/acetaminophen 5/500 as needed.  7. Cod liver oil four tablets daily.  8. Fish oil tablets one tablet daily.  9. Mineral oil at bedtime.  10.Also will send him home on albuterol 2.5 mg solution, use with      nebulizer q.i.d.  11.Levaquin 500 mg one p.o. x7 days.   Will set up patient with an appointment to see Dr. Sherwood Gambler or one of his  associates.  It was also recommended that he have a follow-up CT of  chest by PCP to determine resolution of pneumonia based on patient's  history of lung cancer, patient will be told this as well.  It is  recommended he follow up with his doctor in one to three days.   CONDITION:  Stable.   DISPOSITION:  To home.      Candace  Elige Radon, DO  Electronically Signed     CB/MEDQ  D:  12/17/2006  T:  12/17/2006  Job:  [id number]

## 2010-06-19 NOTE — Assessment & Plan Note (Signed)
OFFICE VISIT   Thomas [name], Thomas [name]  DOB:  1927/08/11                                        February 28, 2009  CHART #:  [id number]   HISTORY OF PRESENT ILLNESS:  The patient returns to my office today for  followup of CT scan of the chest done earlier today to rule out  recurrent lung cancer as a cause of his right lower chest and back pain.  Please see my note from February 21, 2009, for a full history.  He said  that he really thinks this pain is related to his kidney stones because  the last episode that he had completely resolved after he passed a  kidney stone into the toilet.  He does have a history of multiple small  kidney stones on CT scan of the abdomen and pelvis.   PHYSICAL EXAMINATION:  On exam today, his blood pressure is 118/68,  pulse is 82 and regular, respiratory rate is 16 and unlabored.  Oxygen  saturation on room air is 95%.  He looks well.  Cardiac exam shows a  regular rate and rhythm with normal heart sounds.  His lung is clear.   DIAGNOSTIC TESTS:  Followup CT scan of the chest done today shows no  evidence of recurrent cancer.  There is scarring in the right lung  related to his previous lobectomy.  There is no pleural effusion or  infiltrate and no chest wall abnormality.   IMPRESSION:  The patient has no evidence of recurrent cancer to explain  his right lower chest and back pain.  I suspect his intermittent pain is  probably related to kidney stones since his last episode completely  resolved after he passed the kidney stone.  He does have a followup  appointment with Urology in the near future to review the findings of  his recent CT scan of the abdomen and pelvis, and I reassured him that I  do not think his symptoms are related to anything going on in his chest.  I will plan to see him back in 1 year with a repeat chest x-ray.   Evelene Croon, M.D.  Electronically Signed   BB/MEDQ  D:  02/28/2009  T:  03/01/2009  Job:   [id number]

## 2010-06-19 NOTE — Assessment & Plan Note (Signed)
OFFICE VISIT   Thomas [name], Thomas [name]  DOB:  January 13, 1928                                        February 17, 2007  CHART #:  [id number]   Thomas [name] returns today for followup of his previous lung cancer,  having had a recent CT scan of the chest for reevaluation.  He was last  seen by me on July 15, 2006 for followup of his T2, N0, M0 squamous cell  carcinoma of the lung status post right upper lobectomy on September 05, 1999.  When I saw him last, his chest x-ray showed chronic postoperative  changes with no sign of recurrence and I recommended seeing him back in  1 year with a chest x-ray.  He was admitted to the hospital from  December 14, 2006-December 17, 2006 with a diagnosis of community  acquired pneumonia.  His chest x-ray showed a new infiltrate in the  right lower lung field.  He was treated with antibiotics and improved.  He was discharged home and had a followup CT scan of the chest to  further evaluate the right lung density.  This was performed on January 30, 2007.  This showed minimal right lower lobe infiltrate, it is almost  completely resolved.  There was no pulmonary mass or pleural effusion.  There are some linear scars present from his previous surgery.  There is  no lymphadenopathy and no acute bony abnormality.  The patient said that  he presented again to the [facility] emergency room in December with  complaints of palpitations that were very symptomatic and was diagnosed  with frequent PVCs.  He has subsequently seen Dr. Daleen Squibb yesterday and was  started on Cardizem and scheduled for an outpatient stress test.  He  said that his breathing has been about the same as always and back to  his baseline.  He has had no cough or sputum production.  He has noticed  no abnormal bone pain.  He denies any headaches or visual changes.   PHYSICAL EXAMINATION:  VITAL SIGNS:  Blood pressure 148/69, pulse 81 and  regular, respiratory rate 18 and unlabored,  oxygen saturation on room  air is 93%.  He looks well.  CARDIAC:  Shows a regular rate and rhythm with normal heart sounds.  LUNG:  Decreased breath sounds throughout consistent with COPD.  There  is no cervical or supraclavicular adenopathy.  ABDOMEN:  Active bowel sounds.  His abdomen is soft and nontender.  There are no palpable masses or organomegaly.   IMPRESSION:  Thomas [name] is now about 7-1/2 years out from lung cancer  surgery.  CT scan of the chest on January 30, 2007 showed no sign of  recurrent cancer.  The density seen on chest x-ray in the right lower  lung field was most likely related to some community acquired pneumonia  and was almost completely resolved by the time the CT scan was  performed.  I will plan to see him back in 1 year with a repeat chest x-  ray for continued followup.  He will follow up with Dr. Daleen Squibb for his  cardiac care and Dr. Sherwood Gambler for his primary care.   Evelene Croon, M.D.  Electronically Signed   BB/MEDQ  D:  02/17/2007  T:  02/17/2007  Job:  [id number]   cc:  Thomas C. Daleen Squibb, MD, [facility]  Madelin Rear. Sherwood Gambler, MD

## 2010-06-19 NOTE — Assessment & Plan Note (Signed)
OFFICE VISIT   WATT, GEILER  DOB:  1927-04-20                                        February 21, 2009  CHART #:  [id number]   HISTORY OF PRESENT ILLNESS:  The patient returns to my office today for  followup status post right upper lobectomy for T2 N0 M0 squamous cell  carcinoma of the lung on September 05, 1999.  I last saw him on February 16, 2008, at which time he was doing well.  He now complains of episodes of  severe right lateral lower chest pain radiating around his back and  across to the other side.  He said this has been particularly severe at  times but comes and goes and is not associated with any specific type of  activity.  He relates that he does have a history of some kidney stones  but this seems different.  He denies any cough or sputum production.  He  does have some shortness of breath with exertion which is unchanged and  also said he wheezes when he lays down at night.  He said they did have  a CT scan of the abdomen and pelvis in December 2010 for this pain which  did not show any abnormalities to account for it.  I did review his CT  scan and there are some right renal cyst.  There are some small renal  calculi on the right side, but no evidence of urinary tract obstruction.  There are no other abnormalities.   PHYSICAL EXAMINATION:  Today, blood pressure is 155/76, pulse is 90 and  regular, respiratory rate is 18 and unlabored.  Oxygen saturation on  room air is 95%.  He looks well.  Cardiac exam shows a regular rate and  rhythm with normal heart sounds.  His lung exam is clear, but there are  decreased breath sounds throughout.  There is no wheezing.  Abdominal  exam shows active bowel sounds.  His abdomen is soft, mildly obese, and  nontender.  There are no palpable masses or organomegaly.  Chest wall  exam shows no visible or palpable abnormality.  Skin incision is well  healed.  Neck exam shows no evidence of cervical or  supraclavicular  adenopathy.  There is no axillary adenopathy.   DIAGNOSTIC TESTS:  Followup chest x-ray today shows postsurgical changes  in the right hemithorax that are unchanged from previous x-ray.   IMPRESSION:  The patient has some episodes of severe right lower chest  pain radiating around his right back of unclear etiology.  This is low  enough that it could be due to something in his chest or abdomen.  Given  his history of lung cancer, I think we should repeat his CT scan of the  chest to rule out any occult malignant findings since it is difficult to  see this area in the lower hemithorax on chest x-ray.  It is possible he  could have sustained a rib fracture, although he does not report any  falls and he is not seen particularly tender to palpation.  He has an  appointment with [facility] on March 17, 2008, to follow up on  his kidney stones and right kidney cyst as well as some blood in his  urine that was noted previously.  I will plan to see him  back after his  CT scan of the chest has been performed.   Evelene Croon, M.D.  Electronically Signed   BB/MEDQ  D:  02/21/2009  T:  02/22/2009  Job:  [id number]

## 2010-06-19 NOTE — Group Therapy Note (Signed)
NAME:  Thomas [name], Thomas [name]              ACCOUNT NO.:  [id number]   MEDICAL RECORD NO.:  [id number]          PATIENT TYPE:  INP   LOCATION:  A228                          FACILITY:  [facility]   PHYSICIAN:  Osvaldo Shipper, MD     DATE OF BIRTH:  Aug 25, 1927   DATE OF PROCEDURE:  12/16/2006  DATE OF DISCHARGE:                                 PROGRESS NOTE   Subjectively, patient is feeling better, no complaints offered today.   Objectively, his vital signs show he had a temperature of 101.8 today,  heart rate 93, respiratory rate 18, blood pressure 109/57 and saturation  96% on 2 liters, 95% on room air yesterday.  LUNGS:  Clear to auscultation bilaterally.  CARDIOVASCULAR:  S1 S2 is normal, regular.  ABDOMEN:  Soft, no edema is present.   LAB DATA:  His white count is improved to 16,000, his hemoglobin is  13.5, platelet count is 144,000.  His BMET shows improvement in his  renal function.  His HBA1c is 5.9.  The blood cultures are all negative  so far.   1. Community-acquired pneumonia on the right side, continue IV      antibiotics for today.  He is being febrile today; hence, we will      continue to monitor that.  His white count is getting better.  2. History of lung cancer status post lung resection.  He will need a      followup chest x-ray in about 4 weeks time which needs to be set up      as an outpatient.  3. Diabetes.  He is on insulin and glipizide, we are giving him a      lower dose of insulin, will restart his glipizide today and check      his CBGs before meals and QHS and he is on a sliding scale.  His      A1c suggests very good control of his diabetes.  4. History of prostate cancer, chronic obstructive pulmonary disease;      is all stable.  His renal function has improved.  He does have a      possible history of chronic renal insufficiency.   His simvastatin and his Xanax was restarted today.   So, the plan on this gentleman is for his pneumonia to improve.  His  fever needs to subside.  I think he can potentially be discharged  tomorrow if the above is satisfied.  Once again, he will need a followup  chest x-ray in 4 weeks, which will need to be arranged.      Osvaldo Shipper, MD  Electronically Signed     GK/MEDQ  D:  12/16/2006  T:  12/16/2006  Job:  [id number]

## 2010-06-19 NOTE — Assessment & Plan Note (Signed)
[facility]                            CARDIOLOGY OFFICE NOTE   NAME:[name], Thomas CREAN                     MRN:          [id number]  DATE:03/12/2007                            DOB:          07-25-1927    Thomas [name] returns today for further management of his coronary disease  and palpitations.   His stress Myoview demonstrated an EF of 64% with no ischemia.   Started on Diltiazem extended release 240 mg a day.  His palpitations  have remarkably improved.   I am also happy to hear from him that he saw Dr. Laneta Simmers who has cleared  his CT scan for a year.  It showed no recurrent cancer.   EXAM:  Blood pressure of 143/70, his pulse 77 regular.  Respirations 18.  HEENT:  Unchanged.  Carotids are full.  LUNGS:  Reveal expiratory rhonchi.  He smells heavily of tobacco.  HEART:  Reveals a poorly appreciated PMI with normal S1-S2.  ABDOMEN:  Soft.  EXTREMITIES:  Reveal trace edema.  Pulses are intact.   I am pleased Thomas [name] has responded so well to Diltiazem.  I am  obviously delighted about his CT scan results and clearance from Dr.  Laneta Simmers.  His stress Myoview was also very encouraging.   Encouraged him to take care of himself.  Will see him back in a year.     Thomas C. Daleen Squibb, MD, [facility]  Electronically Signed    TCW/MedQ  DD: 03/12/2007  DT: 03/13/2007  Job #: [id number]   cc:   Madelin Rear. Sherwood Gambler, MD

## 2010-06-19 NOTE — Assessment & Plan Note (Signed)
OFFICE VISIT   Thomas [name], Thomas [name]  DOB:  10-06-1927                                        July 15, 2006  CHART #:  [id number]   REASON FOR VISIT:  Thomas [name] returns today for followup approximately 6-  1/2 years following right upper lobectomy for a T2 N0 M0 squamous cell  carcinoma of the lung on September 05, 1999.  He has been doing well since I  last saw him on July 30, 2005.  His breathing has been stable.  He is  currently followed by Dr. Sherwood Gambler for his medical care.  His chest wall  pains come and go and usually related to doing lifting or exertion.  He  also complains of some lower back discomfort, particularly when he is  standing for a long time and has a history of lumbar disc disease.  He  has had no cough or sputum production.  He has had no hemoptysis.   PHYSICAL EXAMINATION:  VITAL SIGNS:  Blood pressure 120/76, pulse 76 and  regular, respirations rate is 18 and unlabored.  Oxygen saturation on  room air is 97%.  GENERAL:  He looks well.  LYMPHATICS:  There is supraclavicular, cervical or axillary adenopathy.  CARDIAC:  Regular rate and rhythm with normal heart sounds.  LUNGS:  Clear.  CHEST:  The chest incision is well-healed and there are no skin lesions.  ABDOMEN:  Soft and nontender.  There are no palpable masses or  organomegaly.   LABORATORY DATA AND X-RAY FINDINGS:  Followup chest x-ray shows chronic  postoperative changes on the right side which are unchanged from last  year.   IMPRESSION/PLAN:  Thomas [name] is doing well, now 6-1/2 years, status post  right upper lobectomy for lung cancer.  He no longer smokes and remains  active.  I told him I would like to see him back in 1 year with a repeat  chest x-ray.   Evelene Croon, M.D.  Electronically Signed   BB/MEDQ  D:  07/15/2006  T:  07/15/2006  Job:  [id number]

## 2010-06-19 NOTE — Assessment & Plan Note (Signed)
[facility]                            CARDIOLOGY OFFICE NOTE   NAME:Thomas [name], Thomas [name]                     MRN:          [id number]  DATE:02/16/2007                            DOB:          1927/04/04    Mr. Bantz comes in today because of some palpitations.  He was seen in  the [facility] ED where he was diagnosed with PVCs.  His potassium at  that time was 3.9, blood work was unremarkable.   He has a history of the following:  1. Coronary artery disease status post coronary bypass grafting 1995.      His last stress Myoview was in December of 2007, normal left      ventricular function, EF 61% with no obvious ischemia or      infarction.  2. Hyperlipidemia.  He is currently on simvastatin 40 mg a day with      good control.  3. Type 2 diabetes followed by Dr. Sherwood Gambler.  He is currently insulin      dependent  4. COPD.  He has a lot of wheezing which precludes using beta      blockade.  5. Abnormal chest x-ray followed by a CT scan.  He has persistent      infiltrate in the right lower lobe.  He is seeing Dr. Laneta Simmers this      week.   He really is plagued by these palpitations.  He has had no angina.  Denies any orthopnea.  He does have dyspnea on exertion.  He denies any  PND or peripheral edema.   CURRENT MEDS:  1. Glipizide 10 mg b.i.d.  2. Lantus 30 units at night.  3. Simvastatin 40 mg day.  4. Aspirin 325 mg a day.  5. Cod liver 5 daily.  6. Fish oil 1 daily.  7. Mineral oil nightly.  8. Niacin 1500 mg nightly.  9. Alprazolam 1 mg nightly.   His blood pressure is 134/67, his pulse is 71 and regular, his weight is  204, skin is ruddy, respiratory rate 20 and __________ .  He has  inspiratory/expiratory wheezes in the basis.  HEENT:  Normocephalic/atraumatic, sclerae are injected, PERRLA,  extraocular movements intact, facial symmetry is normal.  NECK:  Supple, carotids were equal bilaterally without bruits, no JVD,  thyroid is not  enlarged, trachea is midline.  HEART:  Reveals a regular rate and rhythm, no gallop.  Soft systolic  murmur at the left lower sternal border.  ABDOMINAL:  Soft, good bowel sounds.  EXTREMITIES:  Reveal no cyanosis, clubbing or edema.  Pulses are  present.  NEURO:  Exam is intact.   ASSESSMENT/PLAN:  Mr. Pelissier is having symptomatic premature ventricular  contractions.  I am going to arrange for an adenosine rest stress  Myoview.  If he has normal left ventricular function and no ischemia  will treat with medical therapy.  I have given him a prescription for  diltiazem extended release 240 mg a day.  I am reluctant to give him a  beta blocker with his chronic obstructive pulmonary disease and active  wheezing.  If he needs surgery this will also hopefully clear him for that as well.     Thomas C. Daleen Squibb, MD, [facility]  Electronically Signed    TCW/MedQ  DD: 02/16/2007  DT: 02/16/2007  Job #: [id number]   cc:   Madelin Rear. Sherwood Gambler, MD  Evelene Croon, M.D.

## 2010-06-22 NOTE — Op Note (Signed)
NAME:  Thomas [name], Thomas [name]              ACCOUNT NO.:  [id number]   MEDICAL RECORD NO.:  [id number]          PATIENT TYPE:  AMB   LOCATION:  DAY                           FACILITY:  [facility]   PHYSICIAN:  Lionel December, M.D.    DATE OF BIRTH:  July 31, 1927   DATE OF PROCEDURE:  DATE OF DISCHARGE:                                 OPERATIVE REPORT   PROCEDURE:  Colonoscopy.   INDICATION:  [name] is a 77 year old Caucasian male with history of colonic  polyps who is here for surveillance exam. Procedures and risks were reviewed  the patient, and informed consent was obtained.   MEDICINE FOR CONSCIOUS STATION:  Demerol 525 mg IV Versed 4 mg IV.   FINDINGS:  Procedure performed in endoscopy suite. The patient's vital signs  and O2 saturation were monitored during the procedure and remained stable.  The patient was placed in the left lateral recumbent position and rectal  examination performed. No abnormality noted on external or digital exam. The  Olympus videoscope was placed in the rectum and advanced under vision into  sigmoid colon beyond. Preparation was satisfactory. Scattered diverticula at  sigmoid and descending colon. Scope was passed into cecum which was  identified by appendiceal orifice and ileocecal valve. Pictures taken for  the record.   As the scope was withdrawn colonic mucosa was carefully examined. There was  a small polyp at rectum which was ablated by cold biopsy. Scope was  retroflexed to examine anorectal junction and small hemorrhoids were noted  below the dentate line. Endoscope was straightened and withdrawn. The  patient tolerated the procedure well.   FINAL DIAGNOSIS:  1.  Small polyp ablated by cold biopsy from the rectum.  2.  Left colonic diverticulosis (mild).  3.  Small external hemorrhoids.   RECOMMENDATIONS:  1.  The patient will resume his usual meds and insulin as before.  2.  He should continue with high-fiber diet plus fiber supplement. He should  being a fiber supplement of choice.  3.  I will be contacting patient with biopsy results.      Lionel December, M.D.  Electronically Signed     NR/MEDQ  D:  02/01/2005  T:  02/01/2005  Job:  [id number]   cc:   Madelin Rear. Sherwood Gambler, MD  Fax: [phone]

## 2010-06-22 NOTE — Letter (Signed)
March 12, 2006    Madelin Rear. Sherwood Gambler, MD  [address]  [place], [place] [address]   RE:  GIANCARLOS, BERENDT  MRN:  [id number]  /  DOB:  13-May-1927   Dear Peyton Najjar:   Mr. Warren returns to the office for continued assessment and treatment  of coronary disease.  Since his last visit, he has felt perfectly well.  He performed a pharmacologic stress Myoview study which revealed normal  left ventricular systolic function and normal myocardial perfusion.   EXAMINATION:  GENERAL:  Pleasant gentleman in no acute distress.  VITAL SIGNS:  The weight is 214, 2 pounds more than one week ago.  Blood  pressure 110/70, heart rate 72 and regular, respirations 16.  NECK:  No jugular venous distention.  LUNGS:  Initially, minimal rhonchi noted at the left base; cleared with  cough.  CARDIAC:  Normal first and second heart sounds; minimal basilar systolic  ejection murmur.  EXTREMITIES:  Trace edema.   IMPRESSION:  Mr. Ratajczak is doing well.  A negative stress test rules out  high-risk coronary disease.  We will proceed with his therapy as  planned, obtain a lipid profile and metabolic profile in 2 months, and  schedule a return visit in 1 year.    Sincerely,      Gerrit Friends. Dietrich Pates, MD, [facility]  Electronically Signed    RMR/MedQ  DD: 03/12/2006  DT: 03/12/2006  Job #: [id number]

## 2010-06-22 NOTE — H&P (Signed)
NAME:  Thomas [name], Thomas [name]                        ACCOUNT NO.:  [id number]   MEDICAL RECORD NO.:  [id number]                   PATIENT TYPE:  INP   LOCATION:  3731                                 FACILITY:  [facility]   PHYSICIAN:  Rollene Rotunda, M.D.                DATE OF BIRTH:  12-22-1927   DATE OF ADMISSION:  08/11/2002  DATE OF DISCHARGE:                                HISTORY & PHYSICAL   PRIMARY CARE PHYSICIAN:  Patrica Duel, M.D.   CARDIOLOGIST:  Jesse Sans. Wall, M.D.   REASON FOR PRESENTATION:  Evaluation of chest pain.   HISTORY OF PRESENT ILLNESS:  The patient is a pleasant 77 year old gentleman  with a history of CABG in 1995.  He has been seen apparently yearly by Dr.  Daleen Squibb.  He had a Cardiolite apparently in November in the office which the  patient reports demonstrated no evidence of ischemia.   He had been in his usual state of health and doing relatively well until  tonight.  While sitting, he developed moderate substernal chest discomfort.  It was associated with diaphoresis and a clammy feeling.  He said this  pain was the same as that that he experienced prior to his CABG.  It lasted  for 35-40 minutes.  He finally told his wife, who went to the neighbors and  got a nitroglycerin.  The pain eased after this.  He had no recurrence.  He  presented to [facility] where he was not found to have objective  evidence of ischemia.  He was pain free through the ER visit and is  transferred now for further evaluation.  The discomfort was a tightness.  It  was substernal and nonradiating.  He is moderately active and has not been  able to bring on this pain.  He is somewhat limited in his activities  secondary to COPD and lung resection.   PAST MEDICAL HISTORY:  1. Coronary artery disease, status post CABG in 1995 (four-vessel CABG; I do     not have a record of this).  2. Hyperlipidemia.  3. Diabetes mellitus x9 years.  4. Squamous cell lung cancer.  5. Prostate  cancer.  6. Gastroesophageal reflux disease and peptic ulcer disease.  7. COPD.   PAST SURGICAL HISTORY:  1. Right upper lobe lobectomy for squamous cell lung cancer.  2. Cholecystectomy.  3. Cervical laminectomy.  4. Cervical cyst drainage.  5. Prostatectomy.  6. Repair of an SMA aneurysm.   ALLERGIES:  NUBAIN.   MEDICATIONS:  (Doses unknown)  1. Amaryl.  2. Prevacid.  3. Niacin.  4. Crestor.  5. Aspirin.   SOCIAL HISTORY:  The patient is married.  He has five children.  He is  retired as a Engineer, drilling.  He smoked for 50 years prior to quitting  in 1995.  He does not drink alcohol.   FAMILY HISTORY:  Positive for early coronary  artery disease.   REVIEW OF SYSTEMS:  As stated in the HPI and negative for other systems.   PHYSICAL EXAMINATION:  GENERAL:  The patient is in no distress.  VITAL SIGNS:  Blood pressure 141/90, heart rate 84 and regular, afebrile.  HEENT:  Eyelids unremarkable.  Pupils are equal, round, and reactive to  light.  Fundi not visualized.  Oral mucosa unremarkable, edentulous.  NECK:  No jugular venous distention, wave form within normal limits, carotid  upstroke brisk and symmetric, no bruits, no thyromegaly.  LYMPHATICS:  No cervical, axillary, or inguinal adenopathy.  LUNGS:  Decreased breath sounds with noninspiratory wheezes, no crackles.  BACK:  Mild tenderness to palpation of the lower lumbar spine.  CHEST:  Well-healed sternotomy scar.  HEART:  PMI not displaced or sustained, quiet precordium, S1 and S2 within  normal limits.  No S3, no S4, no murmurs.  ABDOMEN:  Well-healed surgical scars, positive bowel sounds, normal in  frequency and pitch.  No bruits, rebound, guarding.  No midline pulsatile  mass, no hepatomegaly or splenomegaly.  SKIN:  No rash, no nodules.  EXTREMITIES:  2+ pulses throughout, well-healed right saphenous vein graft  harvest site, no edema, no cyanosis, no clubbing.  NEUROLOGIC:  Oriented to person, place, and  time.  Cranial nerves II-XII  grossly intact, motor grossly intact.   LABORATORY DATA:  EKG:  Sinus rhythm, rate 80, axis within normal limits,  right bundle branch block.   INR 1.4.  Glucose 164, BUN 14, creatinine 1.2, potassium 4.4, sodium 139.  Troponin 0.01, CK 153, MB 2.7.  WBC 7.3, hemoglobin 15.7, platelets 220,000.   ASSESSMENT AND PLAN:  1. Chest discomfort.  The patient's chest discomfort is similar to his     previous angina.  It was relieved with nitrates.  It occurred at rest.     This has to be considered unstable angina.  The patient will be treated     with Lovenox and topical nitrates.  I will hold his beta-blocker     secondary to his wheezing.  He will have elective cardiac     catheterization.  Further evaluation will be based on these results.  2. Diabetes.  The patient will continue with his Amaryl and sliding scale     insulin.  We will check a hemoglobin A1c.  3. Hyperlipidemia.  We will check a fasting lipid profile and continue     Crestor.  4. Chronic obstructive pulmonary disease.  We will try to avoid beta-     blockers for now given his wheezing.  5. Status post squamous cell lung cancer resected.  6. Prostate cancer, status post prostatectomy.  7. Peptic ulcer disease/gastroesophageal reflux disease.  The patient will     continue Prevacid.                                               Rollene Rotunda, M.D.    JH/MEDQ  D:  08/11/2002  T:  08/11/2002  Job:  [id number]   cc:   Patrica Duel, M.D.  [address]  [place]  [place] [address]  Fax: [phone]   Jesse Sans. Wall, M.D.    cc:   Patrica Duel, M.D.  [address], [address]  [place]  [place] [address]  Fax: [phone]   Jesse Sans. Wall, M.D.

## 2010-06-22 NOTE — Op Note (Signed)
[facility]  Patient:    Thomas [name], Thomas [name]                       MRN: [id number] Proc. Date: 08/07/99 Adm. Date:  [id number] Attending:  Avie Echevaria CC:         Dr. Sherwood Gambler                           Operative Report  PROCEDURE:  Fiberoptic bronchoscopy with a transbronchial biopsy and brushing of the right upper lobe.  REQUESTING PHYSICIAN:  Dr. Sherwood Gambler.  HISTORY AND INDICATIONS:  Please see dictated comprehensive pulmonary consultation note performed in the office within the last week. There has been no change on exam. The patient agreed to the procedure after a full discussion of its risks, benefits, and alternatives. The problem is one of a right upper lobe mass with no history of any hemoptysis.  Bronchoscopy felt to be indicated to try to evaluate endobronchial anatomy and obtain tissue diagnosis if possible.  The procedure was performed in the bronchoscopy suite with continuous monitoring by surface ECG and oximetry. He maintained greater than 90% saturation throughout the procedure in sinus rhythm. He received a total of 2.5 mg of IV Versed and 25 mg of IV Demerol for adequate sedation and cough suppression.  DESCRIPTION OF PROCEDURE:  Using a standard flexible fiberoptic bronchoscope, the right naris was easily cannulated with good visualization of the entire oropharynx and larynx. The cords moved normally and there were no apparent upper airway lesions.  Using an additional 1% lidocaine as needed, the entire tracheobronchial tree was explored bilaterally with the following findings:  1. There was mild diffuse inflammatory change with erythema and evidence of    chronic bronchitis but no evidence purulent secretions nor was there any    evidence of endobronchial lesions.  The right upper lobe was approached bronchoscopically. There was no way to assess the mass seen in the right upper lobe by bronchoscopy so blind biopsy and brushing  was performed on the lesion by fluoroscopy. This resulted in mild to moderate bleeding which was controlled with suctioning.  The patient tolerated the procedure well and a follow-up chest x-ray as well as bone scan are pending at the time of this diagnosis.  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 Based on the central location of this lesion, it is going to be very difficult to perform a percutaneous biopsy and he may actually require a mini thoracotomy or VATS procedure to obtain a tissue diagnosis. DD:  08/07/99 TD:  08/07/99 Job: [id number]

## 2010-06-22 NOTE — H&P (Signed)
NAME:  Thomas [name], Thomas [name] NO.:  [id number]   MEDICAL RECORD NO.:  [id number]          PATIENT TYPE:  EMS   LOCATION:  MAJO                         FACILITY:  [facility]   PHYSICIAN:  Rollene Rotunda, M.D.   DATE OF BIRTH:  01-25-1928   DATE OF ADMISSION:  01/03/2004  DATE OF DISCHARGE:                                HISTORY & PHYSICAL   PRIMARY CARE PHYSICIAN:  Patrica Duel, M.D.   CARDIOLOGIST:  Jonelle Sidle, M.D. [facility].   REASON FOR PRESENTATION:  Palpitations.   HISTORY OF PRESENT ILLNESS:  The patient is pleasant a 77 year old gentleman  with a past history of CABG.  He was in his usual state of good health until  tonight when he felt his heart beating out of his chest.  He had been  having rapid heart beat daily, particularly at night, for several weeks to  months.  He had no presyncope or syncope associated with this.  He has not  seen blood McDowell in a while, but he is due to see him later this week.  He has had no stress testing or other evaluation.  He denies any chest pain  or shortness of breath.  He has had no PND or orthopnea.  Today, he called  EMS.  He was noted to have some sinus tachycardia.  Prior to EMS arrival, he  took his neighbor's nitroglycerin.  In EMS he was hypotensive requiring some  fluid and brief Trendelenburg.  In the emergency room, he is normotensive  without any complaints.  He has a normal sinus rhythm with a right bundle  branch block.  Initial point-of-care markers have been negative.   PAST MEDICAL HISTORY:  1.  Diabetes mellitus.  2.  Coronary artery disease (CABG in 1995.  Cath, in July 2004, demonstrated      severe three-vessel coronary disease including an unprotected OM 90%      stenosis.  He also had 90% stenosis in a vein graft to the circumflex.      He had patent vein graft to his right coronary and patent LIMA to his      LAD.  He underwent staged stenting of the unprotected OM and vein graft      by Dr.  Riley Kill).  3.  Hyperlipidemia.  4.  COPD.   PAST SURGICAL HISTORY:  1.  Right upper lobe lobectomy for squamous cell lung cancer.  2.  Cholecystectomy.  3.  Cervical laminectomy.  4.  Cervical cyst drainage.  5.  Prostatectomy.  6.  Repair of an SMA aneurysm.   ALLERGIES:  NUBAIN.   MEDICATIONS:  (Doses no clear) aspirin, Glucotrol, Actos, insulin, Niaspan,  and Pravachol.   SOCIAL HISTORY:  The patient recently moved from [place] to [place].  He is  married.  He has five children.  He is a retired Production designer, theatre/television/film at __________  .  He smoked for 50 years prior to quitting in 1995.  He does not drink  alcohol.   FAMILY HISTORY:  Positive for early coronary artery disease in first degree  relatives.   REVIEW OF SYSTEMS:  Positive for urinary incontinence.  Otherwise negative  for all other systems.   PHYSICAL EXAMINATION:  GENERAL:  The patient is in no distress.  VITAL SIGNS:  Blood pressure 108/66, heart rate 75 and regular, afebrile.  HEENT:  Eyes unremarkable.  Pupils are equal, round and reactive to light.  Fundi not visualized.  Oral mucosa unremarkable.  Edentulous.  NECK:  No jugular venous distention.  Wave form within normal limits.  Carotid upstroke brisk and symmetrical.  No bruits.  No thyromegaly.  LYMPHATICS:  No cervical, axillary, inguinal adenopathy.  LUNGS:  Clear to auscultation bilaterally.  BACK:  No costovertebral angle tenderness.  CHEST:  Well healed sternotomy scar.  HEART:  PMI not displaced or sustained.  S1 and S2 within normal limits.  No  S3, no S4, no murmurs.  ABDOMEN:  Flat.  Positive bowel sounds normal in frequency and pitch.  No  bruits, no rebound, no guarding, no midline pulsatile masses, no  hepatomegaly, no splenomegaly.  SKIN:  No rashes.  No nodules.  EXTREMITIES:  Pulses 2+ throughout.  No edema, no cyanosis, no clubbing.  NEURO:  Oriented to person, place and time.  Cranial nerves II-XII grossly  intact.  Motor grossly intact.   EKG:   Sinus rhythm, rate 85, axis rightward, right bundle branch block, no  acute ST-T wave changes, no change from previous EKGs.   LABS:  Point-of-care markers x 2 negative.  Other labs pending.   Chest x-ray pending.   ASSESSMENT/PLAN:  1.  Palpitations.  The patient is predominantly complaining of palpitations.      He has been having these almost nightly.  They have not been documented      during this trip.  He has not been having any chest discomfort.  He      __________  observed in the emergency room while two sets enzymes are      cycled.  He will be maintained on telemetry.  If there is nothing      abnormal coming from these, he should be discharged in the morning.  He      should have an outpatient event recorder placed.  He has followup      already scheduled in two days with Dr. Diona Browner.  2.  Risk reduction.  The patient will continue on his medications and have      his lipids followed by his primary care      doctor as well as Dr. Diona Browner.  3.  Diabetes.  Try to get an idea of what his medications are.  He will not      need any today as they are all morning medications from my      understanding.  We will cover him with sliding scale insulin while he is      here.       JH/MEDQ  D:  01/04/2004  T:  01/04/2004  Job:  [id number]   cc:   Patrica Duel, M.D.  [address], [address]  [place]  [place] [address]  Fax: [phone]   [facility]  [place], [place]

## 2010-06-22 NOTE — Cardiovascular Report (Signed)
NAME:  Thomas [name], Thomas [name]                        ACCOUNT NO.:  [id number]   MEDICAL RECORD NO.:  [id number]                   PATIENT TYPE:  INP   LOCATION:  6533                                 FACILITY:  [facility]   PHYSICIAN:  Arturo Morton. Riley Kill, M.D.             DATE OF BIRTH:  [birth year]   DATE OF PROCEDURE:  08/12/2002  DATE OF DISCHARGE:                              CARDIAC CATHETERIZATION   INDICATIONS:  Mr. Teuscher is a very delightful 77 year old who previously  underwent revascularization surgery in 1995.  He has also had squamous cell  cancer of the lung with right upper lobe resection.  He recently presented  with unstable angina.  He was brought to the cath lab for further  evaluation.   PROCEDURES:  1. Left heart catheterization.  2. Selective coronary arteriography.  3. Selective left ventriculography.  4. Saphenous vein graft angiography x2.  5. Selective left internal mammary angiography.   DESCRIPTION OF PROCEDURE:  The procedure was performed from the right  femoral artery using 6 French catheter.  Tolerated the procedure well and  there were no complications.  He was taken to the holding area in  satisfactory clinical condition. His wife was not available at the time, so  we deferred any type of intervention.   HEMODYNAMIC DATA:  1. Central aorta:  133/78.  2. Left ventricle:  116/17.  3. No aortic or left ventricular gradient on pullback across the aortic     valve.   ANGIOGRAPHIC DATA:  1. Ventriculography was performed in the RAO projection.  Because of     ventricular ectopy, ejection fraction could not be accurately calculated     on sinus beat.  However, overall systolic function was preserved.  No     segmental wall motion abnormalities were identified.  2. The left main coronary artery was free of critical disease.  3. The LAD was subtotally occluded and then a 70% lesion.  There was     competitive filling of the vessel distally.  4. The left  internal mammary to the distal LAD appeared to be widely patent.  5. The circumflex provides the first marginal branch. It is free of critical     disease.  After this, there is a segmental area of plaquing of about 50%     overlying the takeoff of the AV circumflex.  The AV circumflex itself was     totally occluded.  Just beyond this into the marginal was a 90% stenosis     supplying both marginal branches.  6. The saphenous vein graft to the OM has about a long 90% stenosis in it.     This supplies the AV circumflex distally.  7. The right coronary artery is totally occluded.  8. The saphenous vein graft to the distal right is intact.  There is a     posterolateral branch with long 80% stenosis.   CONCLUSION:  1. Preserved left ventricular function.  2. Continued patency of internal  mammary of the left anterior descending.  3. Continued patency of the saphenous vein graft to the right with distal     posterior lateral disease as described.  4. High grade stenosis of a large marginal branch.  5. High grade stenosis of the vein graft to the posterior lateral branch.   DISPOSITION:  We plan to review options.  My recommendation would be to go  ahead and open the circumflex marginal and then in a staged manner open up  the saphenous vein graft with a second sitting because of the potential for  procedural infarction.  This will be discussed with the patient and his wife  as soon as she arrives.                                                Arturo Morton. Riley Kill, M.D.    TDS/MEDQ  D:  08/12/2002  T:  08/12/2002  Job:  [id number]  Patrica Duel, M.D.  [address]  [place]  [place] [address]  Fax: [phone]   cc:   Patrica Duel, M.D.  [address]  [place]  [place] [address]  Fax: [phone]

## 2010-06-22 NOTE — H&P (Signed)
[place]. [facility]  Patient:    Thomas [name], Thomas [name]                     MRN: [id number] Adm. Date:  [id number] Attending:  Howell Pringle                         History and Physical  HISTORY OF PRESENT ILLNESS:  Mr. Prude is a 77 year old white male with multiple medical problems who began having right upper quadrant and right flank pain yesterday.  This pain was associated with nausea and vomiting. From yesterday to today his pain worsened to the point where it was doubling him over.  There was nothing he could do to relieve the pain.  He denies any chest pains or shortness of breath, diarrhea, dysuria, fevers, or chills.  His other review of systems is unremarkable.  He did note a pain in his right shoulder about two weeks ago that was relieved with nitroglycerin.  That is the only time in the recent past that he can remember taking nitroglycerin.  PAST MEDICAL HISTORY:  Significant for coronary artery disease, COPD, non-insulin-dependent diabetes mellitus, peptic ulcer disease, gastroesophageal reflux disease, an SMA aneurysm, prostate cancer, and lung cancer.  PAST SURGICAL HISTORY:  Significant for a four-vessel CABG in 1995, an esophageal stretching procedure, a right upper and segmental right lower lobe lobectomy, a cervical laminectomy, and an SMA aneurysm repair.  MEDICATIONS:  Include Lopid, an aspirin.  ALLERGIES:  NUBAIN.  SOCIAL HISTORY:  He was a smoker but has not smoked recently.  Denies any alcohol use.  FAMILY HISTORY:  Noncontributory.  PHYSICAL EXAMINATION:  GENERAL:  He is an elderly white male lying in bed in some discomfort.  SKIN:  Warm and dry.  HEENT:  His extraocular muscles are intact.  Pupils are equal, round and reactive to light.  NECK:  Has no bruits.  LUNGS:  Clear to auscultation bilaterally.  HEART:  Regular rate and rhythm.  ABDOMEN:  Soft with tenderness to palpation in his right upper  quadrant.  EXTREMITIES:  He has no cyanosis, clubbing, or edema.  NEUROLOGIC:  He is alert and oriented x 4.  HEMATOLOGIC:  I can palpate no lymphadenopathy.  LABORATORY WORK:  His lipase was 41, amylase 75.  Sodium 136, potassium 4.1, chloride 103, CO2 21, BUN 14, creatinine 1.2, glucose 212, alkaline phosphatase 119, total bilirubin 0.7, SGOT 19, SGPT 15.  His troponin was less than 0.1.  His white count was 11,500 with 77% segs; 13% lymphs; 6% monos; and a hemoglobin of 15.2; hematocrit 43.7; and a platelet count of 293,000.  ASSESSMENT AND PLAN:  This is a 77 year old white male with new onset of right upper quadrant pain and right flank pain with a CT scan that shows a nonobstructing right renal stone, the question of sludge and stones within his gallbladder by ultrasound.  We will admit him for pain control and hydration. We will contact his cardiologist in the morning for management of his medical problems while he is here in the hospital.  I suspect a good portion of his pain is coming from his gallbladder and he may require a cholecystectomy during this hospitalization. DD:  02/14/00 TD:  02/15/00 Job: [id number]

## 2010-06-22 NOTE — Discharge Summary (Signed)
NAME:  Thomas [name], Thomas [name]                        ACCOUNT NO.:  [id number]   MEDICAL RECORD NO.:  [id number]                   PATIENT TYPE:  OIB   LOCATION:  6525                                 FACILITY:  [facility]   PHYSICIAN:  Arturo Morton. Riley [name], M.D.             DATE OF BIRTH:  Dec 20, 1927   DATE OF ADMISSION:  08/16/2002  DATE OF DISCHARGE:  08/17/2002                           DISCHARGE SUMMARY - REFERRING   SUMMARY OF HISTORY:  Thomas [name] is a 77 year old white male who was recently  hospitalized from 7/7 to 7/9 with unstable angina. A catheterization during  that admission showed native three-vessel coronary artery disease with a 90%  proximal long saphenous vein graft lesion to the mid circumflex. HE also had  a 90% OM lesion that was not bypassed. Dr. Riley [name] at that time performed  Taxus stenting to the OM lesion and wanted him to return to undergo stenting  to the saphenous vein graft lesion, thus his admission at this time.   His history is also notable for COPD, four-vessel bypass grafting in 1995,  hyperlipidemia, diabetes, squamous cell lung cancer, prostate cancer, GERD,  peptic ulcer disease, status post right upper lobectomy for squamous cell  lung cancer, cholecystectomy, cervical laminectomy, cyst drainage, and  repair of SMA aneurysm.   Postprocedure, H and H is 14.3 and 41.1, platelets 181, WBCs 7.0. Sodium  135, potassium 4.1, BUN 17, creatinine 1.1, glucose 216. CK, MBs, and  troponins were negative.   HOSPITAL COURSE:  Thomas [name] was brought in through day surgery. Dr. Riley [name]  performed cardiac catheterization and Express stenting to saphenous vein  graft to the circumflex reducing the 90% long lesion to 0% without  difficulty. He took pictures of the prior intervention site, and this had no  restenosis. Post sheath removal and bedrest, the patient was ambulating  without difficulty. After review on July 13, Dr. Riley [name] felt that he could  be discharged  home.   DISCHARGE DIAGNOSES:  1. Unstable angina status post stenting to the saphenous vein graft to the     circumflex.  2. Hyperlipidemia.  3. Diabetes.  4. Chronic obstructive pulmonary disease with continued tobacco use.   DISPOSITION:  He is discharged home on his preadmission medications. These  include:  1. Plavix 75 mg q.d. for at least three months per Dr. Riley [name].  2. Coated aspirin 325 q.d.  3. Nitroglycerin 0.4 as needed.  4. Amaryl 2 mg q.d.  5. Crestor 10 mg q.h.s.  6. Prevacid 30 mg q.d.  7. Niacin 500 mg q.d.   At the time of discharge, it was reiterated to the patient to please call  the company that provides Plavix assistance to determine if he has been  approved and when they will send the Plavix supply to our office for his  pickup. He was given this information by Luster Landsberg, case manager, during his  last hospitalization. After discussing with Renee, the  hospital can not give  him any further Plavix samples, and we do not have any Plavix samples at the  office. He was instructed that if he does not receive the Plavix from the  assistance form that he will need to purchase the Plavix on his own of which  he did receive a prescription for during his last admission. In regards to  his last admission, it was noted that hemoglobin A1c was elevated as well as  his lipid panel. The patient states that Dr. Laneta Simmers had just checked this  approximately a week or two ago, and he was told everything was fine. Thus,  in followup, he will have fasting lipid and a hemoglobin A1c prior to this  appointment with Dr. Carmin Richmond. on July 28 at 2 p.m. At the time of this  followup appointment, his lipid panel and hemoglobin A1c as well as his home  Glucometer readings should be evaluated and aggressive cardiac risk factor  modification should be pursued including tobacco cessation. He was advised  no lifting, driving, sexual activity, or heavy exertion for two days.  Maintain low  salt, fat, and cholesterol ADA diet. If he has any problems  with his catheterization site, he was asked to call immediately. He was  advised no smoking or tobacco products and to bring all medicines to all  appointments.     Joellyn Rued, P.A. [facility]                    Thomas [name], M.D.    EW/MEDQ  D:  08/17/2002  T:  08/17/2002  Job:  [id number]   cc:   Patrica Duel, M.D.  [address]  [place]  [place] [address]  Fax: [phone]    cc:   Patrica Duel, M.D.  [address], [address]  [place]  [place] [address]  Fax: [phone]

## 2010-06-22 NOTE — Discharge Summary (Signed)
[place]. [facility]  Patient:    Thomas [name], Thomas [name]                     MRN: [id number] Adm. Date:  [id number] Disc. Date: [id number] Attending:  Caleen Essex                           Discharge Summary  SUMMARY OF HISTORY:  Mr. Thomas [name] is a 77 year old gentleman who was admitted by my partner Chevis Pretty, M.D., with right upper quadrant and right flank pain over 24 hours duration.  A CT scan of the abdomen showed a right renal stone, as well as sludge and stones in the gallbladder.  He was therefore admitted with a diagnosis of possible cholecystitis.  The decision was made preoperatively to have his cardiologist to see the patient as well because of his history of coronary artery disease.  HOSPITAL COURSE:  The patient was admitted to the hospital and placed on IV antibiotics.  His cardiologist, Maisie Fus C. Wall, M.D., evaluated Thomas [name].  A HIDA scan was performed which showed nonvisualization of the gallbladder consistent with acute cholecystitis.  Plans were then made for a cholecystectomy.  The patient was taken to the operating room on February 16, 2000, where he underwent a laparoscopic cholecystectomy, which was converted to an open cholecystectomy.  A 19 [place] Blake drain was left in place postoperatively.  Postoperatively, he went to the regular surgical floor and was continued on IV antibiotics.  On postoperative day #1, his drain was draining 45 cc of serosanguineous fluid.  His bilirubin had returned to normal at 1.0 and he was continued on IV antibiotics.  By postoperative day #2, he was more comfortable and was having flatus and a bowel movement.  His white count had decreased to 2.8.  At this point, he was started on a diet and telemetry was stopped as his home medications were resumed from a cardiologic standpoint.  He did well for the rest of his hospitalization and by postoperative day #3, was tolerating a regular diet.  His drain was  draining minimal fluids, so it was removed.  His incision was healing well.  After evaluation by the cardiologist, the decision was made to discharge the patient home.  DISCHARGE DIAGNOSES: 1. Acute cholecystitis. 2. Coronary artery disease.  DISCHARGE MEDICATIONS:  The patient will resume his home medications.  He will take Vicodin for pain.  ACTIVITY:  He is to do no heavy lifting.  WOUND CARE:  He may shower.  DISCHARGE FOLLOW-UP:  He will follow up in my office in one week post discharge.DD:  04/09/00 TD:  04/09/00 Job: [id number]

## 2010-06-22 NOTE — Letter (Signed)
March 05, 2006    Madelin Rear. Sherwood Gambler, MD  [address]  [place], [place] [address]   RE:  MORRISON, MCBRYAR  MRN:  [id number]  /  DOB:  05-08-1927   Dear Peyton Najjar,   Mr. Shearn was seen in the office today, at your request.  As you know,  he has been followed by [facility] intermittently for more than  a decade.  He underwent CABG surgery in 1995, but subsequently returned  with a 90% obstruction of the saphenous vein graft to the circumflex,  requiring a drug eluting stent in 2004.  He has done well since that  time.  He was last seen by Dr. Diona Browner in the [place] office in 2005.  He  has had carcinoma of the prostate and has undergone a prostatectomy in  1997, apparently with cure.  He underwent resection of a squamous cell  carcinoma of the right upper lobe of the lung in 2001, also apparently  with cure.  He has COPD, but discontinued cigarettes in 1995.  He has  dyslipidemia that was under somewhat suboptimal control when last  assessed.   CURRENT MEDICATIONS INCLUDE:  1. Glucotrol 10 mg daily.  2. Lantus insulin 15 units daily.  3. Pravastatin 40 mg daily.  4. Aspirin 325 mg daily.  5. Alprazolam 1 mg q.h.s.  6. Niacin 1500 mg q.h.s.   ON EXAM:  A very pleasant, fairly trim gentleman, in no acute distress.  The weight is 212, 27 pounds more than in 2003.  Blood pressure 135/65,  heart rate 75 and regular, respirations 16.  NECK:  No jugular venous distention; normal carotid upstrokes without  bruits.  LUNGS:  Clear.  CARDIAC:  Normal first and second heart sounds; fourth heart sound  present.  ABDOMEN:  Soft and nontender; no organomegaly.  EXTREMITIES:  Trace edema; bounding distal pulses.   Mr. Hynek reports some atypical chest discomfort.  He occasionally has  numbness in his left hand with some discomfort in the left forearm and  shoulder.  He requests evaluation for possible recurrent myocardial  ischemia.   Recent laboratories include a normal CBC, normal  TSH, normal chemistry  profile, normal hepatic profile, hemoglobin A1c of 5.7.  His lipid  profile shows total cholesterol of 198, triglycerides 157, HDL of 47 and  LDL of 120.   IMPRESSION:  Mr. Kersh is doing generally well.  A pharmacologic stress  nuclear study would be reasonable and will be scheduled in the near  future.  Control of hyperlipidemia is suboptimal.  Simvastatin will be  substituted for Pravastatin with a repeat lipid profile in two months.  Pneumococcal vaccine is up to date, but the patient did not receive  influenza vaccine this season.  He is reminded to do so in the future.  I will reassess this nice gentleman once his testing has been completed.    Sincerely,      Gerrit Friends. Dietrich Pates, MD, [facility]  Electronically Signed    RMR/MedQ  DD: 03/05/2006  DT: 03/05/2006  Job #: [id number]

## 2010-06-22 NOTE — Op Note (Signed)
[place]. [facility]  Patient:    Thomas [name], Thomas [name]                     MRN: [id number] Proc. Date: 02/16/00 Adm. Date:  [id number] Attending:  Chevis Pretty S                           Operative Report  PREOPERATIVE DIAGNOSIS: Acute cholecystitis.  POSTOPERATIVE DIAGNOSIS: Gangrenous cholecystitis.  OPERATION/PROCEDURE: Laparoscopic converted to open cholecystectomy.  SURGEON: Abigail Miyamoto, M.D.  ASSISTANT: Anselm Pancoast. Zachery Dakins, M.D.  ANESTHESIA: General endotracheal anesthesia.  ESTIMATED BLOOD LOSS: Minimal.  DRAINS: Blake drain, 19 French x 1.  FINDINGS: The patient was found to have a gangrenous gallbladder.  DESCRIPTION OF PROCEDURE: The patient was brought to the operating room and identified as Thomas [name].  He was placed supine on the operating room table and general anesthesia was induced.  His abdomen was then prepped and draped in the usual sterile fashion.  Using a #15 blade a small transverse incision was made below the umbilicus and the incision carried down to the fascia, which was opened with a scalpel.  A hemostat was then used to pass into the peritoneal cavity.  Next, 0 Vicryl pursestring suture was placed around the fascia opening.  The side port was then placed in the opening and insufflation of the abdomen begun.  The camera was then inserted.  The patient was found to have multiple adhesion to the entire midline and mid quadrants. A small window was visualized in the left upper quadrant and a 5 mm port was placed under direct vision.  Blunt dissection was then used along with cautery to take down a lot of the adhesions of omentum to the abdominal wall.  The gallbladder could finally be visualized in the right upper quadrant and was found to be extremely gangrenous and inflamed.  Given these findings the decision was made to proceed to an open procedure.  At this point the ports were removed.  Next, a right subcostal  incision was made with the scalpel and the incision carried down through the fascia and muscles with electrocautery. The peritoneum was then opened.  The gallbladder again was found to be quite gangrenous and distended.  An aspirator was inserted in the gallbladder and bile was aspirated to facilitate gallbladder retraction.  The gallbladder was then grasped with a Kelly clamp and dissected bluntly from the liver bed.  The cystic artery and its branches were then identified and clipped twice proximally and distally and cut with scissors.  The cystic duct was then identified and clipped twice proximally and once distally and transected as well, removing the gallbladder completely.  Hemostasis was then achieved in the gallbladder bed with cautery.  A piece of Surgicel was also placed in the gallbladder fossa.  The right upper quadrant was then copiously irrigated with normal saline.  A separate skin incision was then made and a 19 [place] Blake drain inserted into the gallbladder fossa.  The drain was sewn in with a Nylon suture.  The posterior fascia was then closed with running #1 PDS suture.  The anterior fascia was likewise closed with a running #1 PDS suture as well.  The skin was then irrigated and closed with skin staples.  The Vicryl at the umbilicus was also tied in place.  The skin incision of the umbilicus and left upper quadrant were also closed with staples.  The  patient tolerated the procedure well.  All sponge, needle, and instrument counts were correct at the end of the procedure.  The patient was then extubated in the operating room and taken in stable condition to the recovery room. DD:  02/16/00 TD:  02/17/00 Job: [id number]

## 2010-06-22 NOTE — Consult Note (Signed)
NAME:  Thomas [name], Thomas [name]              ACCOUNT NO.:  [id number]   MEDICAL RECORD NO.:  [id number]          PATIENT TYPE:  AMB   LOCATION:                                FACILITY:  [facility]   PHYSICIAN:  Lionel December, M.D.    DATE OF BIRTH:  [birth year]   DATE OF CONSULTATION:  01/23/2005  DATE OF DISCHARGE:                                   CONSULTATION   REFERRING PHYSICIAN:  Madelin Rear. Sherwood Gambler, M.D.   REASON FOR CONSULTATION:  History of adenomatous polyps.   HISTORY OF PRESENT ILLNESS:  Thomas [name] is a 77 year old, Caucasian male who  has a history of adenomatous colonic polyps.  He last had a colonoscopy  about 7-8 years ago by Dr. Lovell Sheehan.  He believes this exam was clear.  Prior  to this, he had a colonoscopy by Dr. Karilyn Cota in March 1993, and was found to  have an adenomatous polyp from the sigmoid and rectosigmoid regions.  Overall, Thomas [name] has done quite well.  He does complain of a few times of  the month of intermittent, episodic, mid abdominal pain just below the  umbilicus that radiates to both sides.  He describes the pain as cramping  type pain.  It is severe for a matter of a few minutes, but then goes away  rapidly.  He denies any association with eating or particular foods.  The  pain can occur whether he is sitting or moving around.  He denies any  nausea, vomiting or diarrhea.  He does complain of chronic constipation and  generally can go up to 5 days without a bowel movement.  However, recently  he has started taking four capsules of cod liver oil a day and this has  resulted in having a bowel movement every 2-3 days.  He takes a rare over-  the-counter laxative and denies any enema use.  He denies any fiber  supplementation.   PAST MEDICAL HISTORY:  1.  History of adenomatous polyps.  Last colonoscopy with Dr. Lovell Sheehan was      normal about 7-8 years ago.  2.  History of type 2 diabetes mellitus, currently on insulin.  3.  Hypercholesterolemia.  4.  Asthma.  5.   History of gangrenous gallbladder, status post cholecystectomy.  6.  History of prostate carcinoma, status post radical prostatectomy.  7.  History of lung carcinoma, status post right thoracotomy.  8.  He had pancreatitis, status post cholecystectomy.  9.  Coronary artery disease, status post CABG.  10. History of skin carcinoma removed from his nasal bridge.  11. Tonsillectomy.  12. Abdominal aneurysm repair.  13. COPD.   CURRENT MEDICATIONS:  1.  Pravastatin 40 mg daily.  2.  Lantus 30 units at bedtime.  3.  Niacin 1500 mg daily.  4.  Aspirin 325 mg daily.  5.  Cod liver oil two capsules in the morning and two capsules in the      evening.   ALLERGIES:  NUBAIN.   FAMILY HISTORY:  No known family history of colorectal carcinoma, liver or  chronic GI problems.  Mother deceased  at age 38 secondary to MI.  Father  deceased at age 3 secondary to pneumonia.  He has one daughter with  coronary artery disease.   SOCIAL HISTORY:  Thomas [name] has been married x56 years.  He has four grown  children.  He is a retired Production designer, theatre/television/film from [employer]  He reports a 50-pack-year  history of tobacco use quitting in 1995.  Denies any alcohol or drug use.   REVIEW OF SYSTEMS:  CONSTITUTIONAL:  Weight was stable.  Denies any anorexia  or early satiety.  Denies any fever or chills.  Denies any fatigue.  CARDIOVASCULAR:  Denies any chest pain or palpitations.  PULMONARY:  He does  have some shortness of breath on exertion, status post thoracotomy, but  denies any cough or hemoptysis.  GASTROINTESTINAL:  See HPI.  Denies any  heartburn, indigestion, dysphagia, odynophagia, early satiety, vomiting or  nausea.   PHYSICAL EXAMINATION:  VITAL SIGNS:  Weight 251.5 pounds.  Height 71 inches.  Temperature 98.2, blood pressure 110/60, pulse 60.  GENERAL:  Thomas [name] is a 77 year old, Caucasian male who is alert,  oriented, pleasant and cooperative in no acute distress.  HEENT:  Sclerae clear, nonicteric.   Conjunctivae pink.  Oropharynx pink and  moist without any lesions.  NECK:  Supple without mass or thyromegaly.  CHEST:  Heart regular rate and rhythm with normal S1, S2.  LUNGS:  With inspiratory and expiratory wheezes throughout.  ABDOMEN:  Positive bowel sounds x4.  No bruits auscultated.  Soft,  nontender, nondistended without palpable mass or hepatosplenomegaly.  He  does have multiple well-healed scars in right upper quadrant and vertical  midline scar from previous surgeries.  There is no rebound tenderness or  guarding, no hepatosplenomegaly or mass.  EXTREMITIES:  Without edema bilaterally.  He does have some clubbing.   IMPRESSION:  Thomas [name] is a 77 year old, Caucasian male with a history of  adenomatous polyps.  He is due for a repeat surveillance colonoscopy.  He  also has a history of chronic intermittent, fleeting abdominal pain mostly  cramp-like in nature.  He has chronic constipation and can go up to 5 days  without a bowel movement if he does not take his cod liver oil.  I suspect  some of his abdominal pain may well be related to his chronic constipation.   RECOMMENDATIONS:  1.  Will schedule colonoscopy with Dr. Karilyn Cota in the near future.  I      discussed this procedure including risks and benefits including, but not      limited to, bleeding, infection, perforation, drug reaction and he      agrees and consent will be obtained.  He is to hold his aspirin for 3      days prior to the exam.  I also asked him to decrease his dose      of Lantus to 10 units the night before the procedure.  2.  I have given Benefiber samples as well as chronic constipation      literature.   We would like to thank Dr. Sherwood Gambler for allowing [place] to take part in the care of  Thomas [name].      Nicholas Lose, N.P.      Lionel December, M.D.  Electronically Signed   KC/MEDQ  D:  01/23/2005  T:  01/24/2005  Job:  [id number]

## 2010-06-22 NOTE — Procedures (Signed)
[facility]  Patient:    Thomas [name], Thomas [name]                       MRN: [id number] Proc. Date: 09/05/99 Adm. Date:  [id number] Attending:  Cleatrice Burke CC:         Bedelia Person, M.D.   Procedure Report  PROCEDURE:  Epidural catheter placement for postoperative epidural fentanyl pain control.  DESCRIPTION OF PROCEDURE:  Prior to the surgical procedure, the risks and benefits of a postoperative epidural fentanyl pain control were discussed with the patient.  These included, but were not limited to the risk of nerve damage, paralysis, backache, headache, allergic reaction to the anesthetic, nausea and a more common reaction of itching.  It was discussed with the patient that the epidural would only be placed if the procedure went to thoracotomy.  In consultation with the patients primary surgeon, Dr. Evelene Croon, patient elected to proceed with the technique for postoperative pain control.  After the surgical dressing was applied, patient was turned to the full left lateral decubitus position.  Betadine prep x 3 was performed on the lumbar region.  Sterile technique was used.  A #17-gauge Tuohy needle was inserted in the L2-3 interspace due to a previous lumbar laminectomy at the lower interspaces.  Using the midline approach, the L2-3 interspace was located without difficulty using air loss of resistance.  A nonstyletted catheter was then placed 6 cm into the epidural space and the needle was removed.  There was a negative aspiration for blood or CSF through the epidural catheter.  The catheter was securely adhesed to the patients back using Hypafix dressing over a sterile 2 x 2 at the insertion site.  A mixture of preservative-free Xylocaine 1% - 5 cc, 0.9 normal saline - 10 cc and 100 mcg of fentanyl were slowly injected into the epidural catheter.  The patient was then returned to the supine position, awakened, extubated, and taken to the  recovery room in good condition.  Where he was placed on a continuous infusion of 5 mcg/cc epidural fentanyl and 1/16th% Marcaine at an initial rate of 16 cc/hr.  He will be evaluated for the next 2-3 days for adequacy of pain control.  There were no complications during the procedure. DD:  09/05/99 TD:  09/06/99 Job: [id number]

## 2010-06-22 NOTE — Cardiovascular Report (Signed)
NAME:  SEYMOUR, PAVLAK                        ACCOUNT NO.:  [id number]   MEDICAL RECORD NO.:  [id number]                   PATIENT TYPE:  INP   LOCATION:  3731                                 FACILITY:  [facility]   PHYSICIAN:  Arturo Morton. Riley Kill, M.D.             DATE OF BIRTH:  04/24/1927   DATE OF PROCEDURE:  08/12/2002  DATE OF DISCHARGE:                              CARDIAC CATHETERIZATION   INDICATIONS:  The patient previously underwent cardiac catheterization  earlier today demonstrating a high grade stenosis of the native circumflex.  He also has a high grade stenosis in the saphenous vein graft that goes to  the distal circumflex.  We elected to do this in a staged manner.  The  patient was brought back for treatment of his native circumflex.  He was in  the holding area with an indwelling sheath as we awaited his family.   PROCEDURE:  The patient was brought to the catheterization laboratory and  prepped and draped in the usual fashion.  With a double glove technique we  exchanged his 6-French sheath for a 7-French sheath.  Heparin was given  according to protocol and Integrilin was given as a double bolus.  Plavix  previously administered.  The ACT was in excess of 200 seconds.  A JL4  guiding catheter was utilized to intubate the left main.  The lesion was  crossed with a high torque floppy wire and pre dilated with a 2.5 Maverick  balloon.  The lesion was then stented using a 2.5 x 24 Taxus drug eluding  stent.  A 3.0 Quantum Maverick balloon was then used to post dilate the  lesion.  We post dilated up to the edges of the stent distally up to about  2.75 and more proximally up to just over 3.0.  There was marked improvement  in appearance of the artery with an excellent angiographic final appearance.  The patient had some mild back discomfort, but tolerated procedure without  complications.  He was taken to the holding area in satisfactory clinical  condition.   Prior to  the intervention the circumflex had segmental disease leading into  a large bifurcating circumflex marginal.  The circumflex marginal had a  stenosis just prior to the bifurcation.  The AV circumflex itself was  occluded.  Leading into this was segmental plaquing of about 30-50%.  Following stenting this entire area appeared to be normal.  There was TIMI 3  flow at the beginning and TIMI 3 flow at the completion of the procedure.  There appeared to be no angiographic complications.   CONCLUSION:  Successful percutaneous stenting of the circumflex coronary  artery.   DISPOSITION:  The patient's sheath will be taken out when the ACT is less  than 150.  He will be brought back for a later study likely to involve  distal protection of a saphenous vein graft.  He will remain on Plavix  in  the interim.                                               Arturo Morton. Riley Kill, M.D.    TDS/MEDQ  D:  08/12/2002  T:  08/12/2002  Job:  [id number]  Patrica Duel, M.D.  [address]  [place]  [place] [address]  Fax: [phone]   Jesse Sans. Wall, M.D.   Rollene Rotunda, M.D.   CV Lab   cc:   Patrica Duel, M.D.  [address]  [place]  [place] [address]  Fax: [phone]   Jesse Sans. Wall, M.D.   Rollene Rotunda, M.D.   CV Lab

## 2010-06-22 NOTE — Discharge Summary (Signed)
NAME:  Thomas, [name]                        ACCOUNT NO.:  [id number]   MEDICAL RECORD NO.:  [id number]                   PATIENT TYPE:  INP   LOCATION:  6533                                 FACILITY:  [facility]   PHYSICIAN:  Doylene Canning. Ladona Ridgel, M.D.               DATE OF BIRTH:  18-Nov-1927   DATE OF ADMISSION:  08/11/2002  DATE OF DISCHARGE:  08/13/2002                           DISCHARGE SUMMARY - REFERRING   HISTORY:  Thomas [name] is a 77 year old white male who had been in his usual  state of health until the evening of August 11, 2002.  While sitting, he  developed moderate substernal chest discomfort associated with diaphoresis  and a clammy feeling.  He felt that it was similar to what he experienced  prior to his bypass and lasted 35-40 minutes. He finally told his wife, who  went to the neighbors and got some sublingual nitroglycerin.  The discomfort  eased after this. He has not had any reoccurrence.  He presented to [facility] and was transferred for further evaluation.   It was noted that his activities are limited secondary to COPD.  He has a  history of four-vessel bypass grafting in 1995, unknown anatomy,  hyperlipidemia, diabetes, squamous cell lung cancer, prostate cancer, GERD,  peptic ulcer disease, COPD. Status post right upper lobectomy for squamous  cell lung cancer, cholecystectomy, cervical laminectomy, cyst drainage and  repair of SMA aneurysm.   LABORATORY DATA:  At [facility], H&H 15.7/45.4, normal indices,  platelets 220, WBC 7.3.  Sodium 139, potassium 4.4, BUN 14, creatinine 1.2,  glucose 164. Initial CK 153, MB 2.7, and Troponin 0.01.  PT 14.6, PTT 26.9.  On transfer to [facility], TSH 2.939.  Fasting lipids showed total cholesterol  172, triglycerides 1275, HDL 30, LDL was not calculated.  Subsequent CK-MBs  and Troponins were negative for myocardial infarction. Hemoglobin A1c was  elevated at 7.1.   On admission, sodium 140, potassium 3.9, BUN 12,  creatinine 1.2, glucose  154.  EKG showed normal sinus rhythm, normal axis, right bundle branch  block, nonspecific T-wave changes.   HOSPITAL COURSE:  Mr. Laymon was transferred and admitted to [facility], to anticipate a cardiac catheterization.  Catheterization was  performed on August 12, 2002, by Dr. Riley Kill.  According to Dr. Rosalyn Charters  notes, the patient has native three-vessel coronary artery disease, the LIMA  to the LAD was patent, the saphenous vein graft to the distal RCA was  patent, he had a 90% proximal, long saphenous vein graft lesion to the mid  circumflex.  It was noted that an OM branch had a 90% lesion that was not  bypassed.  Dr. Riley Kill performed TAXUS stenting to the OM.  Please refer to  that dictation.   It was felt that after review, he should return and have intervention to the  saphenous vein graft, to the circumflex.  Postsheath removal  and bedrest, he  was ambulating without difficulty. Catheterization site was intact. Dr.  Riley Kill noted, on his postcatheterization  rounds, that the patient refused  to eliminate chewing tobacco.  On July 9, after review by Dr. Ladona Ridgel, it was  felt that the patient could be discharged home.  Prior to discharge, case  management and cardiac rehabilitation saw him in consultation.  Case  management arranged for the hospital to provide two weeks of Plavix and  also, gave the patient the patient assistance forms.   DISCHARGE DIAGNOSES:  1. Unstable angina.  2. Progressive coronary artery disease.  3. Continued tobacco use.  4. Status post angioplasty and stenting of the obtuse marginal.  5. Hyperlipidemia.  6. History as previous.   DISPOSITION:  The patient was discharged to home.   DISCHARGE MEDICATIONS:  1. Plavix 75 mg daily for at least six months.  2. Coated aspirin 325 daily.  3. Nitroglycerin as needed.  4. He was asked to continue Amaryl, Crestor, Prevacid and Niacin at previous     dosages.    ACTIVITY:  Maintain no lifting, driving, sexual activity or heavy exertion  x2 days.   DIET:  Low-salt, low-cholesterol, ADA diet.   WOUND CARE:  If he has any problems with his catheterization site, he was  asked to call.   FOLLOWUP:  He was to return to [facility] on Monday, July 12, at 7:30  a.m. to undergo intervention of the saphenous vein graft to the circumflex.  He was asked to have nothing to eat or drink after midnight, except to take  some small sips of water.  Post that procedure, he will need a followup  appointment with Dr. Daleen Squibb. He will also need to follow up with Dr. Patrica Duel in regards to his diabetes, since his hemoglobin A1c is elevated,  and his hypertriglyceridemia.  This can also reflect problems with sugar.   At the time of followup with Dr. Daleen Squibb in [place], again cholesterol  medications and his recent cholesterol panel will be reviewed, and  consideration should be given to increasing his Crestor and Niacin, as well  as reinforcing cardiac risk factor modification, especially tobacco  cessation.  The patient received a new prescription for sublingual  nitroglycerin prior to discharge.     Joellyn Rued, P.A.-C  [facility]                 Doylene Canning. Ladona Ridgel, M.D.    EW/MEDQ  D:  08/13/2002  T:  08/13/2002  Job:  [id number]   cc:   Thomas C. Daleen Squibb, M.D.   Patrica Duel, M.D.  [address]  [place]  [place] [address]  Fax: [phone]   cc:   Thomas C. Daleen Squibb, M.D.   Patrica Duel, M.D.  [address], [address]  [place]  [place] [address]  Fax: [phone]

## 2010-06-22 NOTE — Cardiovascular Report (Signed)
NAME:  Thomas [name], HAGEMEISTER                        ACCOUNT NO.:  [id number]   MEDICAL RECORD NO.:  [id number]                   PATIENT TYPE:  OIB   LOCATION:  6525                                 FACILITY:  [facility]   PHYSICIAN:  Arturo Morton. Riley Kill, M.D.             DATE OF BIRTH:  09-18-1927   DATE OF PROCEDURE:  08/16/2002  DATE OF DISCHARGE:  08/17/2002                              CARDIAC CATHETERIZATION   INDICATIONS:  Mr. Mallozzi is a very delightful gentleman who recently  presented with unstable angina.  He had a new high grade lesion in the  circumflex coronary artery and underwent implantation of a drug-eluting  stent in the circumflex with excellent angiographic result.  At that time,  he was also noted to have a long tandem lesion in a saphenous vein graft to  the circumflex system.  It was felt that this should be done in a stage  manner.  The patient was brought back today for further treatment.   The patient gave informed consent for enrollment also in the Spider trial.   DESCRIPTION OF PROCEDURE:  The patient was brought to the catheterization  lab and prepped and draped in the usual fashion.  He was given intravenous  morphine for back pain.  Through an anterior puncture, the femoral artery on  the left was entered.  A 7 French sheath was placed.  We initially used a 7  [place] Amplatz guide, but then exchanged for first a JL left bypass guide  and then a 6 [place] Amplatz guide.  Angiomax was given according to  protocol.  The patient was randomized Percu-Surge.  The vessel was about a 3-  mm artery.  Using the [facility] wire, the lesion was crossed with the  Percu-Surge wire.  A 32 x 2.75 stent was then prepared for the tandem  lesions in the saphenous vein graft.  The balloon was then taken up.  The  stent was then passed into place and delivered.  The stent balloon was then  removed and the export catheter was used to export the residual.  All  catheters were then  subsequently removed.  Final angiographic views were  obtained.  All catheters were then subsequently removed and the femoral  sheath sewn into place.  The patient had a therapeutic ACT with Angiomax.   ANGIOGRAPHIC DATA:  1. As on the previously study, the left main was widely patent.  2. The LAD and intermediate were unchanged from the previous study.  3. The circumflex leading into two marginal branches at the site of previous     stenting was widely patent.   The saphenous vein graft to the circumflex marginal revealed tandem lesions  of 90%.  This covered an area of about 25 mm.  A 32-mm Express II stent was  then delivered with excellent angiographic appearance with reduction in the  stenosis to 0%.  There was TIMI-3 flow both  before and after the procedure  and there was no evidence of no reflow during the course of the procedure.  The patient did not experience severe substernal chest pain.  Again, the  final angiographic result was excellent.   CONCLUSIONS:  Successful percutaneous stenting of the distal circumflex  using Percu-Surge protection.   DISPOSITION:  The patient will be treated medically with aspirin and Plavix.  Long term followup will be with Dr. Daleen Squibb and Dr. Nobie Putnam.                                               Arturo Morton. Riley Kill, M.D.    TDS/MEDQ  D:  08/16/2002  T:  08/17/2002  Job:  [id number]  Jesse Sans. Daleen Squibb, M.D.   Patrica Duel, M.D.  [address], [address]  [place]  [place] [address]  Fax: [phone]   cc:   Thomas C. Daleen Squibb, M.D.   Patrica Duel, M.D.  [address]  [place]  [place] [address]  Fax: [phone]

## 2010-06-22 NOTE — Op Note (Signed)
[place]. [facility]  Patient:    Thomas [name], Thomas [name]                       MRN: [id number] Proc. Date: 09/05/99 Adm. Date:  [id number] Attending:  Cleatrice Burke CC:         Alleen Borne, M.D., CVTS Office  Charlaine Dalton. Wert, M.D. 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  OPERATIVE PROCEDURES:  Flexible fiberoptic bronchoscopy, mediastinoscopy with lymph node biopsies, right thoracotomy and right upper lobectomy with superior segmentectomy of the right lower lobe.  ATTENDING SURGEON:  Alleen Borne, M.D.  ASSISTANT:  Norton Blizzard, M.D.  ANESTHESIA:  General endotracheal.  CLINICAL HISTORY:  This patient is a 77 year old gentleman known to me status post coronary artery bypass surgery in June 1995.  He had been smoking up to that point, but has quit since then.  He recently had some blood in his stool and was being considered for a colonoscopy and had a preprocedural chest x-ray that showed a right suprahilar lung mass.  Chest CT scan showed a 4.5 x 2.7 cm irregular mass in the right suprahilar area.  There was no mediastinal, hilar, or axillary adenopathy.  There was no pericardial or pleural effusion.  There was a question of several additional subcentimeter nodules in the right upper lobe.  There was also some patchy air space disease peripheral to the dominant mass, which was felt to reflect some postobstructive pneumonitis.  CT scan of the abdomen showed normal liver and spleen.  The adrenal glands, gallbladder and pancreas were normal.  There was a 4 cm simple cyst involving the upper pole of the right kidney and probably several small calculi in the lower pole of the right kidney, as well as, some additional smaller renal cysts.  CT scan of the pelvis showed significant  irregularity of the L5-S1 level.  Bone scan showed no evidence of bony metastatic disease.  The patient had a bronchoscopic biopsy performed by Dr. Sherene Sires.  The right upper lobe biopsy showed squamous cell carcinoma.  Patient was also noted on physical examination to have a small right supraclavicular lymph node, which was biopsied by radiology by fine-needle aspiration and did not show any cancer. After review of his cat scan and examination of the patient, it was felt the best treatment was to proceed with bronchoscopy, mediastinoscopy with lymph node biopsies and right thoracotomy with lung resection for his lung cancer. Preoperative pulmonary function testing showed an FEV1 of about 2.1, which was felt to be adequate to undergo lobectomy and possibly pneumonectomy if indicated.  His preoperative blood gas was quite good.  I discussed the operative procedure with the patient and his wife, including alternatives, benefits and risks, including bleeding, possible blood transfusion, infection, prolonged mechanical ventilation, organ failure, and death.  They understood and agreed to proceed with surgery.  OPERATIVE PROCEDURE:  The patient was taken to the operating room, placed on the table in supine position.  After induction of general endotracheal anesthesia using a single-lumen tube, a Foley catheter was placed in the bladder using sterile technique.  Pneumatic compression stockings were placed on both legs.  Intravenous antibiotics were given preoperatively.  Then, flexible fiberoptic bronchoscopy was performed.  The distal trachea appeared normal.  The carina was sharp.  The left bronchial tree had normal segmental anatomy without any secretions.  There were no endobronchial lesions and no extrinsic compression.  The bronchoscope was then passed down the right bronchial tree.  The orifice of the right upper lobe bronchus appeared somewhat reddened compared to the mainstem bronchus.   There was no endobronchial lesion and no sign of extrinsic compression.  There were some whitish secretions present.  The ostia of the segmental bronchi were identified and were widely patent without obvious abnormality.  The bronchus intermedius appeared normal, as did the lower lobe bronchi.  Then, mediastinoscopy was performed.  The neck was slightly extended.  The neck and chest were prepped with Betadine soap and solution and draped in usual sterile manner.  A 2 cm incision was made transversely one fingerbreadth above the sternal notch in the natural skin crease.  This was carried down through the subcutaneous tissues using electrocautery.  The strap muscles were separated in the midline and retracted laterally.  Dissection was continued down through the trachea.  A palpable nodule was noted in the right thyroid lobe.  Because we were doing rather extensive surgery to this patient, I decided not to intervene for this small nodule.  The size was probably 0.5 cm in diameter.  The trachea was identified.  Dissection was continued down in a pretracheal plane into the mediastinum.  Then the mediastinoscope was inserted.  There were several normal-appearing lymph nodes present.  Biopsies were taken of lymph nodes in the right tracheobronchial angle, right lower paratracheal region, carina, left tracheobronchial angle and left lower paratracheal regions.  All of these specimens were sent to pathology for frozen section and showed no evidence of cancer.  There was complete hemostasis.  The mediastinoscope was withdrawn from the patient.  The strap muscles were reapproximated with interrupted 2-0 Vicryl sutures and the skin with 3-0 Vicryl subcuticular closure.  The sponge, needle and instrument counts were correct according to scrub nurse.  Dry, sterile dressing applied over the incision.  Then, attention was turned to right thoracotomy.  The single-lumen tube was exchanged from a  double-lumen tube.  The patient was then placed in the left  lateral decubitus position with the right side up.  The right chest was then prepped with Betadine scrub and solution and draped in the usual sterile manner.  Then, a right posterolateral thoracotomy incision was performed.  The pleural space was entered through the bed of the fifth rib, which was resected in a subperiosteal manner.  The chest retractor was placed and the ribs opened to provide adequate exposure.  The right lung was palpated.  The mass was present in the right upper lobe in the suprahilar region.  There were no other lesions seen in the right upper lobe.  The right middle lobe appeared free of the tumor.  The right lower lobe appeared grossly free of any nodules or tumors.  The superior segment was present immediately adjacent to the tumor in the upper lobe.  There were several lymph nodes palpable within the hilum of the lung.  Then, the right upper lobe bronchus was dissected to be sure there was adequate separation from the tumor.  The tumor appeared to be completely separate from the right upper lobe bronchus and I was able to encircle this bronchus without difficulty.  Therefore, I decided to proceed with right upper lobectomy.  The mediastinal pleural was then opened.  The branches of the superior pulmonary vein to the upper lobe were identified and  divided using vascular stapler.  The right upper lobe pulmonary artery trunk was identified and was then divided using a vascular stapler.  Then, dissection was continued down the continuation of the pulmonary artery to the posterior ascending branch of the pulmonary artery to the upper lobe and this was divided between ligatures.  Dissection was continued down a little further and there was another branch of the upper lobe, which was divided between ligatures.  The pulmonary artery branches to the lower lobe and the middle lobe were identified and preserved.   The pulmonary vein to the middle lobe was identified and preserved.  Then attention was turned to the right upper lobe bronchus.  This was then divided flush with the bronchus intermedius using a TA-30 bronchial stapler.  The minor fissure was then divided using staplers. All of the hilar lymph nodes were removed with the specimen.  The right upper lobe was then passed off the table.  Examination of the bronchial stump showed no evidence of gross tumor.  It was sent to pathology for frozen section of the bronchial resection margin and this showed no evidence of tumor.  Then, the right middle and lower lobes were inflated without difficulty.  The right middle lobe was fixed to the lower lobe with interrupted sutures to prevent rotation.  The inferior pulmonary ligament was divided using electrocautery to allow the lower lobe to come upwards to fill in the space. There was complete hemostasis.  The bronchial stump appeared airtight under water and at 30-40 cm of pressure.  Then, two chest tubes were placed through separate stab incisions and one positioned anteriorly and one positioned posteriorly.   The ribs were then reapproximated with #2 Vicryl pericostal sutures.  The intercostal muscles were reapproximated with a continuous 2-0 Vicryl suture.  The chest wall muscles were reapproximated with continuous #1 Vicryl suture.  Subcutaneous tissue was closed using continuous 2-0 Vicryl and the skin with staples.  The sponge, needle and instrument counts were correct according to scrub nurse.  Dry sterile dressings were applied over the incisions and around the chest tube, which was hooked to Pleur-evac suction. The patient then had an epidural catheter placed for pain management by anesthesiology.  The patient was then turned to the supine position, extubated and transported to the post anesthesia care unit in satisfactory and stable condition. DD:  09/05/99 TD:  09/06/99 Job:  [id number]

## 2010-06-22 NOTE — Discharge Summary (Signed)
[place]. [facility]  Patient:    Thomas [name], Thomas [name]                       MRN: [id number] Adm. Date:  [id number] Disc. Date: [id number] Attending:  Cleatrice [name] Dictator:   Thomas [name], P.A.-C. CC:         CVTS  Thomas [name], M.D.   Discharge Summary  DATE OF BIRTH:  [birth year]  SURGEON:  Thomas [name], M.D.  CARDIOLOGIST:  Thomas [name], M.D.  ADMISSION DIAGNOSIS:  Right suprahilar squamous cell carcinoma of the lung.  PAST MEDICAL HISTORY: 1. Coronary artery disease with CABG in 1995 by Dr. Laneta [name]. 2. COPD. 3. Non-insulin dependent diabetes mellitus. 4. History of smoking. 5. Peptic ulcer disease and GERD followed by Dr. Judie [name]. Thomas [name]. 6. History of SMA aneurysm repair by Dr. Arbie [name]. 7. History of prostate cancer with prostatectomy. 8. Cervical laminectomy and cyst drainage.  DISCHARGE DIAGNOSES: 1. T2, NO, MO squamous cell carcinoma of the lung.  All nodes and margins    negative. 2. Status post bronchoscopy and mediastinoscopy with right thoracotomy and    right upper lobectomy with superior segmentectomy of the right lower lobe    on September 05, 1999.  BRIEF HISTORY:  Thomas [name] is a 77 year old white male with a history of CAD and CABG seen by Dr. Laneta [name] in 1995.  He also has a history of COPD and heavy tobacco use. He was having a colonoscopy recently secondary to some blood noticed in his stool.  A chest x-ray obtained prior to colonoscopy showed a mass.  CT scan subsequently confirmed a 4.5 x 2.7 cm right suprahilar lung mass.  There was no mediastinal hilar or axillary adenopathy on CT.  There were questionable nodules in the right upper lobe.  There were no METS in the abdomen per CT.  A bronchoscopy was done by Thomas [name].  Pathology showed squamous cell carcinoma in the right upper lobe.  Fine-needle biopsy of the right subclavian lymph node was negative for cancer.  He was referred to CVTS. Dr. Laneta [name] evaluated Thomas [name] and it  was his opinion he should undergo BNM and right thoracotomy to resect the mass.  Risks, benefits and alternatives of surgery were discussed with Thomas [name] and it was agreed to proceed.  Thomas [name] underwent the procedure on September 05, 1999, without complications.  His postop course was relatively benign.  He made good progress.  The main issue that was addressed during his postop period was recovery of his respiratory function.  He also had to wait a rather long time because of the bed situation with no beds on unit 2000.  By September 11, 1999, Thomas [name] is doing quite well. His vital signs are stable.  He is afebrile.  His wound is healing well. Chest x-ray is stable.  He still has some atelectasis and decreased bowel sounds.  His O2 saturation is 92% on room air.  He is being treated with nebulizers, incentive spirometer and activity.  He has been given Humibid L.A. to mobilize secretions.  He is anticipated to be discharged home in the morning, September 12, 1999, pending satisfactory morning rounds.  MEDICATIONS: 1. Albuterol MDI two puffs q.i.d. 2. Humibid L.A. 600 mg two p.o. q.12h. x 5 days. 3. Glucotrol XL 10 mg q.d. 4. Lopid 600 mg two p.o. q.d. 5. Enteric-coated aspirin 325 mg p.o. q.d. 6. Tylox p.r.n.  ALLERGIES:  NUBAIN.  SPECIAL INSTRUCTIONS:  Thomas [name] is told to avoid strenuous activity.  No driving.  No lifting more than 10 pounds.  He is told to use his incentive spirometer dialy.  He was told to maintain a diabetic diet.  He was told he could shower, use soap and water only on his wounds and to call the office if he notices anything unusual with his wounds.  FOLLOWUP:  Thomas [name] will come to CVTS office next Tuesday, September 18, 1999, to see Dr. Laneta [name] and have his staples and sutures removed.  He will go to [facility] one hour before for a chest x-ray. DD:  09/11/99 TD:  09/12/99 Job: [id number]

## 2010-07-26 ENCOUNTER — Observation Stay (HOSPITAL_COMMUNITY)
Admission: EM | Admit: 2010-07-26 | Discharge: 2010-07-27 | DRG: 310 | Disposition: A | Discharge status: Home / Self Care | Payer: Medicare Other | Attending: Internal Medicine | Admitting: Internal Medicine

## 2010-07-26 ENCOUNTER — Emergency Department (HOSPITAL_COMMUNITY): Payer: Medicare Other

## 2010-07-26 DIAGNOSIS — Z794 Long term (current) use of insulin: Secondary | ICD-10-CM

## 2010-07-26 DIAGNOSIS — Z85118 Personal history of other malignant neoplasm of bronchus and lung: Secondary | ICD-10-CM

## 2010-07-26 DIAGNOSIS — J4489 Other specified chronic obstructive pulmonary disease: Secondary | ICD-10-CM | POA: Diagnosis present

## 2010-07-26 DIAGNOSIS — Z23 Encounter for immunization: Secondary | ICD-10-CM | POA: Insufficient documentation

## 2010-07-26 DIAGNOSIS — E119 Type 2 diabetes mellitus without complications: Secondary | ICD-10-CM | POA: Diagnosis present

## 2010-07-26 DIAGNOSIS — J449 Chronic obstructive pulmonary disease, unspecified: Secondary | ICD-10-CM | POA: Diagnosis present

## 2010-07-26 DIAGNOSIS — R0789 Other chest pain: Secondary | ICD-10-CM | POA: Diagnosis present

## 2010-07-26 DIAGNOSIS — M545 Low back pain, unspecified: Secondary | ICD-10-CM | POA: Diagnosis present

## 2010-07-26 DIAGNOSIS — S20219A Contusion of unspecified front wall of thorax, initial encounter: Secondary | ICD-10-CM | POA: Diagnosis present

## 2010-07-26 DIAGNOSIS — R002 Palpitations: Principal | ICD-10-CM | POA: Diagnosis present

## 2010-07-26 DIAGNOSIS — E785 Hyperlipidemia, unspecified: Secondary | ICD-10-CM | POA: Diagnosis present

## 2010-07-26 DIAGNOSIS — G8929 Other chronic pain: Secondary | ICD-10-CM | POA: Diagnosis present

## 2010-07-26 LAB — BASIC METABOLIC PANEL
CO2: 27 mEq/L (ref 19–32)
Calcium: 10.3 mg/dL (ref 8.4–10.5)
Creatinine, Ser: 1.34 mg/dL (ref 0.50–1.35)
GFR calc Af Amer: >60 mL/min (ref >60)
GFR calc non Af Amer: 54 mL/min — ABNORMAL LOW (ref >60)
Glucose, Bld: 199 mg/dL — ABNORMAL HIGH (ref 70–99)
Potassium: 3.6 mEq/L (ref 3.5–5.1)
Sodium: 141 mEq/L (ref 135–145)

## 2010-07-26 LAB — CBC
HCT: 41.8 % (ref 39.0–52.0)
Hemoglobin: 14.5 g/dL (ref 13.0–17.0)
WBC: 6.9 10*3/uL (ref 4.0–10.5)

## 2010-07-26 LAB — CK TOTAL AND CKMB (NOT AT ARMC)
CK, MB: 2.6 ng/mL (ref 0.3–4.0)
Relative Index: 2.2 (ref 0.0–2.5)

## 2010-07-26 LAB — DIFFERENTIAL
Basophils Absolute: 0 10*3/uL (ref 0.0–0.1)
Lymphocytes Relative: 37 % (ref 12–46)
Neutro Abs: 3.5 10*3/uL (ref 1.7–7.7)

## 2010-07-26 LAB — CARDIAC PANEL(CRET KIN+CKTOT+MB+TROPI)
CK, MB: 2.4 ng/mL (ref 0.3–4.0)
Relative Index: 2.2 (ref 0.0–2.5)
Total CK: 109 U/L (ref 7–232)

## 2010-07-26 LAB — GLUCOSE, CAPILLARY

## 2010-07-27 ENCOUNTER — Inpatient Hospital Stay (HOSPITAL_COMMUNITY): Payer: Medicare Other

## 2010-07-27 DIAGNOSIS — R002 Palpitations: Secondary | ICD-10-CM

## 2010-07-27 DIAGNOSIS — R079 Chest pain, unspecified: Secondary | ICD-10-CM

## 2010-07-27 DIAGNOSIS — I517 Cardiomegaly: Secondary | ICD-10-CM

## 2010-07-27 LAB — CBC
MCH: 31 pg (ref 26.0–34.0)
MCHC: 33.4 g/dL (ref 30.0–36.0)
Platelets: 146 10*3/uL — ABNORMAL LOW (ref 150–400)

## 2010-07-27 LAB — GLUCOSE, CAPILLARY: Glucose-Capillary: 123 mg/dL — ABNORMAL HIGH (ref 70–99)

## 2010-07-27 LAB — LIPID PANEL
HDL: 40 mg/dL (ref >39)
LDL Cholesterol: 68 mg/dL (ref 0–99)
Total CHOL/HDL Ratio: 3.8 RATIO
VLDL: 42 mg/dL — ABNORMAL HIGH (ref 0–40)

## 2010-07-27 LAB — DIFFERENTIAL
Basophils Relative: 1 % (ref 0–1)
Eosinophils Absolute: 0.3 10*3/uL (ref 0.0–0.7)
Monocytes Absolute: 0.5 10*3/uL (ref 0.1–1.0)
Monocytes Relative: 9 % (ref 3–12)
Neutrophils Relative %: 43 % (ref 43–77)

## 2010-07-27 LAB — TSH: TSH: 3.061 u[IU]/mL (ref 0.350–4.500)
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
## 2010-07-30 ENCOUNTER — Ambulatory Visit (INDEPENDENT_AMBULATORY_CARE_PROVIDER_SITE_OTHER): Payer: Medicare Other | Admitting: Cardiology

## 2010-07-30 ENCOUNTER — Encounter: Payer: Self-pay | Admitting: Cardiology

## 2010-07-30 VITALS — BP 114/63 | HR 70 | Ht 71.0 in | Wt 205.0 lb

## 2010-07-30 DIAGNOSIS — R079 Chest pain, unspecified: Secondary | ICD-10-CM

## 2010-07-30 MED ORDER — NITROGLYCERIN 0.4 MG SL SUBL: 0.4000 mg | SUBLINGUAL_TABLET | SUBLINGUAL | Status: DC | PRN

## 2010-07-30 NOTE — Patient Instructions (Addendum)
**Note De-Identified  Obfuscation** Your physician recommends that you continue on your current medications as directed. Please refer to the Current Medication list given to you today.  Your physician recommends that you schedule a follow-up appointment in: 1 year  We have called in a fresh prescription on your Nitroglycerin 0.4mg  to be taken as needed for chest pain. Please follow instructions on bottle and if you have any questions please call this office at [phone]
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
## 2010-08-03 ENCOUNTER — Encounter: Payer: Self-pay | Admitting: Cardiology
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
## 2010-08-15 ENCOUNTER — Other Ambulatory Visit: Payer: Self-pay | Admitting: Cardiology

## 2010-08-15 MED ORDER — PRAVASTATIN SODIUM 80 MG PO TABS: 80.0000 mg | ORAL_TABLET | Freq: Every day | ORAL | Status: DC

## 2010-08-15 NOTE — Telephone Encounter (Signed)
.   Requested Prescriptions   Pending Prescriptions Disp Refills  . pravastatin (PRAVACHOL) 80 MG tablet 30 tablet 3

## 2010-09-12 ENCOUNTER — Other Ambulatory Visit: Payer: Self-pay

## 2010-09-12 MED ORDER — PRAVASTATIN SODIUM 80 MG PO TABS: 80.0000 mg | ORAL_TABLET | Freq: Every day | ORAL | Status: DC

## 2010-11-09 LAB — POCT CARDIAC MARKERS
CKMB, poc: <1 — ABNORMAL LOW
Operator id: 151321
Troponin i, poc: <0.05

## 2010-11-09 LAB — DIFFERENTIAL
Basophils Relative: 0
Eosinophils Absolute: 0.2
Monocytes Relative: 8
Neutrophils Relative %: 62

## 2010-11-09 LAB — I-STAT 8, (EC8 V) (CONVERTED LAB)
Acid-base deficit: 1
Chloride: 107
pCO2, Ven: 46
pH, Ven: 7.348 — ABNORMAL HIGH

## 2010-11-09 LAB — CBC
MCHC: 33.8
MCV: 92.1
Platelets: 163

## 2010-11-09 LAB — POCT I-STAT CREATININE
Creatinine, Ser: 1.1
Operator id: 151321

## 2010-11-13 LAB — DIFFERENTIAL
Basophils Relative: 0
Blasts: 0
Eosinophils Absolute: 0
Eosinophils Absolute: 0 — ABNORMAL LOW
Eosinophils Absolute: 0.1 — ABNORMAL LOW
Eosinophils Relative: 0
Eosinophils Relative: 0
Eosinophils Relative: 0
Eosinophils Relative: 1
Lymphocytes Relative: 5 — ABNORMAL LOW
Lymphs Abs: 1.1
Metamyelocytes Relative: 1
Monocytes Absolute: 0.8
Monocytes Absolute: 2.2 — ABNORMAL HIGH
Monocytes Relative: 7
Monocytes Relative: 8
Myelocytes: 1
Neutro Abs: 19.4 — ABNORMAL HIGH
Neutro Abs: 23.7 — ABNORMAL HIGH
Neutrophils Relative %: 79 — ABNORMAL HIGH
Promyelocytes Absolute: 0
nRBC: 0

## 2010-11-13 LAB — CBC
HCT: 39.6
HCT: 42.3
HCT: 44.8
Hemoglobin: 12.9 — ABNORMAL LOW
Hemoglobin: 14.3
MCHC: 33.5
MCHC: 34
MCHC: 34.1
MCV: 93.1
MCV: 93.1
MCV: 93.2
Platelets: 144 — ABNORMAL LOW
Platelets: 150
RBC: 4.13 — ABNORMAL LOW
RBC: 4.5
RDW: 13.4
RDW: 13.8
WBC: 16 — ABNORMAL HIGH
WBC: 24.5 — ABNORMAL HIGH

## 2010-11-13 LAB — BLOOD GAS, ARTERIAL
Acid-base deficit: 2.1 — ABNORMAL HIGH
Bicarbonate: 23.5
O2 Content: 2
O2 Saturation: 96.4
pCO2 arterial: 49.4 — ABNORMAL HIGH
pO2, Arterial: 84.8

## 2010-11-13 LAB — URINE MICROSCOPIC-ADD ON

## 2010-11-13 LAB — BASIC METABOLIC PANEL
BUN: 18
BUN: 23
CO2: 26
CO2: 28
CO2: 28
Calcium: 8.6
Chloride: 103
Chloride: 104
Chloride: 105
Creatinine, Ser: 1.1
Creatinine, Ser: 1.61 — ABNORMAL HIGH
GFR calc Af Amer: 51 — ABNORMAL LOW
GFR calc Af Amer: >60
GFR calc non Af Amer: 43 — ABNORMAL LOW
Glucose, Bld: 111 — ABNORMAL HIGH
Glucose, Bld: 164 — ABNORMAL HIGH
Potassium: 3.9
Potassium: 3.9
Potassium: 4.3
Sodium: 135

## 2010-11-13 LAB — URINALYSIS, ROUTINE W REFLEX MICROSCOPIC
Ketones, ur: 15 — AB
Leukocytes, UA: NEGATIVE
Nitrite: NEGATIVE
Protein, ur: 30 — AB
Urobilinogen, UA: 0.2
pH: 5

## 2010-11-13 LAB — CULTURE, BLOOD (ROUTINE X 2)
Culture: NO GROWTH
Report Status: 11142008

## 2010-11-13 LAB — HEPATIC FUNCTION PANEL
ALT: 22
AST: 31
Albumin: 3.1 — ABNORMAL LOW
Total Protein: 5.8 — ABNORMAL LOW

## 2010-11-13 LAB — HEMOGLOBIN A1C: Mean Plasma Glucose: 133

## 2010-12-11 ENCOUNTER — Emergency Department (HOSPITAL_COMMUNITY)
Admission: EM | Admit: 2010-12-11 | Discharge: 2010-12-11 | Discharge status: Against Medical Advice | Payer: Medicare Other | Attending: Emergency Medicine | Admitting: Emergency Medicine

## 2010-12-11 ENCOUNTER — Encounter (HOSPITAL_COMMUNITY): Payer: Self-pay | Admitting: Emergency Medicine

## 2010-12-11 DIAGNOSIS — S0180XA Unspecified open wound of other part of head, initial encounter: Secondary | ICD-10-CM | POA: Insufficient documentation

## 2010-12-11 DIAGNOSIS — X58XXXA Exposure to other specified factors, initial encounter: Secondary | ICD-10-CM | POA: Insufficient documentation

## 2010-12-11 DIAGNOSIS — Z5320 Procedure and treatment not carried out because of patient's decision for unspecified reasons: Secondary | ICD-10-CM | POA: Insufficient documentation

## 2010-12-11 HISTORY — DX: Angina pectoris, unspecified: I20.9

## 2010-12-11 HISTORY — DX: Malignant (primary) neoplasm, unspecified: C80.1

## 2010-12-11 HISTORY — DX: Hyperlipidemia, unspecified: E78.5

## 2010-12-11 HISTORY — DX: Malignant neoplasm of prostate: C61

## 2010-12-11 HISTORY — DX: Malignant neoplasm of unspecified part of unspecified bronchus or lung: C34.90
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
## 2010-12-20 ENCOUNTER — Emergency Department (HOSPITAL_COMMUNITY)
Admission: EM | Admit: 2010-12-20 | Discharge: 2010-12-21 | Disposition: A | Discharge status: Home / Self Care | Payer: Medicare Other | Attending: Emergency Medicine | Admitting: Emergency Medicine

## 2010-12-20 ENCOUNTER — Emergency Department (HOSPITAL_COMMUNITY): Payer: Medicare Other

## 2010-12-20 ENCOUNTER — Other Ambulatory Visit: Payer: Self-pay

## 2010-12-20 ENCOUNTER — Encounter (HOSPITAL_COMMUNITY): Payer: Self-pay | Admitting: Emergency Medicine

## 2010-12-20 DIAGNOSIS — E119 Type 2 diabetes mellitus without complications: Secondary | ICD-10-CM | POA: Insufficient documentation

## 2010-12-20 DIAGNOSIS — Z794 Long term (current) use of insulin: Secondary | ICD-10-CM | POA: Insufficient documentation

## 2010-12-20 DIAGNOSIS — R209 Unspecified disturbances of skin sensation: Secondary | ICD-10-CM | POA: Insufficient documentation

## 2010-12-20 DIAGNOSIS — E785 Hyperlipidemia, unspecified: Secondary | ICD-10-CM | POA: Insufficient documentation

## 2010-12-20 DIAGNOSIS — R05 Cough: Secondary | ICD-10-CM | POA: Insufficient documentation

## 2010-12-20 DIAGNOSIS — J4489 Other specified chronic obstructive pulmonary disease: Secondary | ICD-10-CM | POA: Insufficient documentation

## 2010-12-20 DIAGNOSIS — R059 Cough, unspecified: Secondary | ICD-10-CM | POA: Insufficient documentation

## 2010-12-20 DIAGNOSIS — R42 Dizziness and giddiness: Secondary | ICD-10-CM

## 2010-12-20 DIAGNOSIS — J449 Chronic obstructive pulmonary disease, unspecified: Secondary | ICD-10-CM

## 2010-12-20 LAB — POCT I-STAT TROPONIN I: Troponin i, poc: 0 ng/mL (ref 0.00–0.08)

## 2010-12-20 LAB — CBC
MCH: 32.1 pg (ref 26.0–34.0)
MCV: 91.5 fL (ref 78.0–100.0)
Platelets: 152 10*3/uL (ref 150–400)
RDW: 13.6 % (ref 11.5–15.5)

## 2010-12-20 LAB — DIFFERENTIAL
Basophils Absolute: 0 10*3/uL (ref 0.0–0.1)
Eosinophils Relative: 4 % (ref 0–5)
Lymphocytes Relative: 36 % (ref 12–46)
Neutro Abs: 3.4 10*3/uL (ref 1.7–7.7)

## 2010-12-20 LAB — POCT I-STAT, CHEM 8
Calcium, Ion: 1.2 mmol/L (ref 1.12–1.32)
Chloride: 105 mEq/L (ref 96–112)
HCT: 43 % (ref 39.0–52.0)
Hemoglobin: 14.6 g/dL (ref 13.0–17.0)
TCO2: 25 mmol/L (ref 0–100)

## 2010-12-20 NOTE — ED Notes (Signed)
Pt c/o dizziness with standing for the last few months. Pt states tonight became dizzy and called ems. Pt denies dizziness currently. Pt denies chest pain or sob. Pt denies any n/t.
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
## 2010-12-21 LAB — URINALYSIS, ROUTINE W REFLEX MICROSCOPIC
Ketones, ur: NEGATIVE mg/dL
Leukocytes, UA: NEGATIVE
Nitrite: NEGATIVE
pH: 6 (ref 5.0–8.0)

## 2010-12-21 LAB — POCT I-STAT TROPONIN I

## 2010-12-21 NOTE — ED Notes (Signed)
Waiting on [facility] to transport home

## 2010-12-21 NOTE — ED Notes (Signed)
WALKED PT FROM ROOM 19 AROUND THE ISLAND IN BLUE AND BACK TO ROOM WITH NO DIZZINESS.

## 2011-02-15 ENCOUNTER — Other Ambulatory Visit: Payer: Self-pay | Admitting: Surgery

## 2011-02-15 DIAGNOSIS — C3490 Malignant neoplasm of unspecified part of unspecified bronchus or lung: Secondary | ICD-10-CM

## 2011-02-19 ENCOUNTER — Ambulatory Visit
Admission: RE | Admit: 2011-02-19 | Discharge: 2011-02-19 | Disposition: A | Discharge status: Home / Self Care | Payer: Medicare Other | Source: Ambulatory Visit | Attending: Surgery | Admitting: Surgery

## 2011-02-19 ENCOUNTER — Encounter: Payer: Self-pay | Admitting: Surgery

## 2011-02-19 ENCOUNTER — Ambulatory Visit (INDEPENDENT_AMBULATORY_CARE_PROVIDER_SITE_OTHER): Payer: Medicare Other | Admitting: Surgery

## 2011-02-19 VITALS — BP 108/66 | HR 76 | Resp 18 | Ht 71.0 in | Wt 204.0 lb

## 2011-02-19 DIAGNOSIS — Z09 Encounter for follow-up examination after completed treatment for conditions other than malignant neoplasm: Secondary | ICD-10-CM

## 2011-02-19 DIAGNOSIS — C3410 Malignant neoplasm of upper lobe, unspecified bronchus or lung: Secondary | ICD-10-CM | POA: Insufficient documentation

## 2011-02-19 DIAGNOSIS — C3490 Malignant neoplasm of unspecified part of unspecified bronchus or lung: Secondary | ICD-10-CM
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
## 2011-02-19 NOTE — Assessment & Plan Note (Signed)
S/p right upper lobectomy and right lower lobe superior segmentectomy 09/05/1999 for T2, N0, M0 squamous cell carcinoma.

## 2011-03-29 ENCOUNTER — Other Ambulatory Visit: Payer: Self-pay | Admitting: *Deleted

## 2011-03-29 ENCOUNTER — Encounter: Payer: Self-pay | Admitting: Cardiology

## 2011-03-29 ENCOUNTER — Ambulatory Visit (INDEPENDENT_AMBULATORY_CARE_PROVIDER_SITE_OTHER): Payer: Medicare Other | Admitting: Cardiology

## 2011-03-29 VITALS — BP 145/79 | HR 76 | Resp 18 | Ht 71.0 in | Wt 201.0 lb

## 2011-03-29 DIAGNOSIS — I4510 Unspecified right bundle-branch block: Secondary | ICD-10-CM

## 2011-03-29 DIAGNOSIS — E785 Hyperlipidemia, unspecified: Secondary | ICD-10-CM

## 2011-03-29 DIAGNOSIS — I25810 Atherosclerosis of coronary artery bypass graft(s) without angina pectoris: Secondary | ICD-10-CM

## 2011-03-29 MED ORDER — DILTIAZEM HCL ER COATED BEADS 240 MG PO CP24: 240.0000 mg | ORAL_CAPSULE | Freq: Every day | ORAL | Status: DC

## 2011-03-29 NOTE — Progress Notes (Signed)
HPI Thomas [name] comes in today for the evaluation and management of his coronary artery disease. Other than some occasional palpitations he is having no symptoms of angina or ischemia the. His big complaint is that he does not sleep well he claims that he has not slept in the last 5 nights. He takes Xanax sometimes up to 2 mg at a time. He's been on this for an unknown period of time. He  He denies orthopnea, PND, chest pain that keeps him awake.  His past medical history says that he has a history of abdominal aortic aneurysm. He has a scar to show that he had this repaired but he can't underwent. There is no mention of surgery his past medical history. His last abdominal CT did not show any evidence of aneurysm.  Past Medical History  Diagnosis Date  . Diabetes mellitus   . Anginal pain   . Hyperlipemia   . Cancer   . Lung cancer   . Prostate cancer   . AAA (abdominal aortic aneurysm)     Current Outpatient Prescriptions  Medication Sig Dispense Refill  . ALPRAZolam (XANAX) 1 MG tablet Take 1 mg by mouth at bedtime as needed. For sleep      . aspirin 325 MG tablet Take 325 mg by mouth daily.        Marland Kitchen Cod Liver Oil CAPS Take 1 capsule by mouth daily.        Marland Kitchen diltiazem (CARDIZEM CD) 240 MG 24 hr capsule Take 240 mg by mouth daily.       Marland Kitchen LANTUS 100 UNIT/ML injection Inject into the skin 2 (two) times daily. Per sliding scale.      . niacin (SLO-NIACIN) 500 MG tablet Take 1,500 mg by mouth at bedtime.      . nitroGLYCERIN (NITROSTAT) 0.4 MG SL tablet Place 1 tablet (0.4 mg total) under the tongue every 5 (five) minutes as needed for chest pain.  25 tablet  3  . Omega-3 Fatty Acids (FISH OIL) 1200 MG CAPS Take 1 capsule by mouth daily.        . pravastatin (PRAVACHOL) 80 MG tablet Take 80 mg by mouth at bedtime.          No Known Allergies  No family history on file.  History   Social History  . Marital Status: Married    Spouse Name: N/A    Number of Children: N/A  . Years of  Education: N/A   Occupational History  . Not on file.   Social History Main Topics  . Smoking status: Former Smoker -- 1.0 packs/day for 50 years    Types: Cigarettes    Quit date: 12/10/1993  . Smokeless tobacco: Current User  . Alcohol Use: No  . Drug Use: No  . Sexually Active: Not on file   Other Topics Concern  . Not on file   Social History Narrative  . No narrative on file    ROS ALL NEGATIVE EXCEPT THOSE NOTED IN HPI  PE  General Appearance: well developed, well nourished in no acute distress, elderly, hard of hearing HEENT: symmetrical face, PERRLA, good dentition  Neck: no JVD, thyromegaly, or adenopathy, trachea midline Chest: symmetric without deformity Cardiac: PMI non-displaced, RRR, Soft S1-S2, no gallop or murmur Lung: clear to ausculation and percussion Vascular: all pulses full without bruits  Abdominal: nondistended, nontender, good bowel sounds, no HSM, no bruits Extremities: no cyanosis, clubbing or edema, no sign of DVT, no varicosities  Skin:  normal color, no rashes Neuro: alert and oriented x 3, non-focal Pysch: normal affect  EKG Normal sinus rhythm, old right bundle branch block, no acute changes. BMET    Component Value Date/Time   NA 142 12/20/2010 2340   K 3.5 12/20/2010 2340   CL 105 12/20/2010 2340   CO2 27 07/26/2010 2100   GLUCOSE 156* 12/20/2010 2340   BUN 11 12/20/2010 2340   CREATININE 0.90 12/20/2010 2340   CALCIUM 10.3 07/26/2010 2100   GFRNONAA 51* 07/26/2010 2100   GFRAA >60 07/26/2010 2100    Lipid Panel     Component Value Date/Time   CHOL 150 07/27/2010 0400   TRIG 211* 07/27/2010 0400   HDL 40 07/27/2010 0400   CHOLHDL 3.8 07/27/2010 0400   VLDL 42* 07/27/2010 0400   LDLCALC 68 07/27/2010 0400    CBC    Component Value Date/Time   WBC 6.8 12/20/2010 2318   RBC 4.61 12/20/2010 2318   HGB 14.6 12/20/2010 2340   HCT 43.0 12/20/2010 2340   PLT 152 12/20/2010 2318   MCV 91.5 12/20/2010 2318   MCH 32.1 12/20/2010  2318   MCHC 35.1 12/20/2010 2318   RDW 13.6 12/20/2010 2318   LYMPHSABS 2.4 12/20/2010 2318   MONOABS 0.7 12/20/2010 2318   EOSABS 0.3 12/20/2010 2318   BASOSABS 0.0 12/20/2010 2318

## 2011-03-29 NOTE — Assessment & Plan Note (Signed)
Stable. No change in management. See me back in the office in one year.

## 2011-03-29 NOTE — Assessment & Plan Note (Signed)
Unchanged

## 2011-03-29 NOTE — Patient Instructions (Signed)
Your physician recommends that you schedule a follow-up appointment in: 12 months.  

## 2011-05-13 ENCOUNTER — Ambulatory Visit: Payer: Medicare Other | Admitting: Cardiology

## 2011-06-27 ENCOUNTER — Ambulatory Visit (INDEPENDENT_AMBULATORY_CARE_PROVIDER_SITE_OTHER): Payer: Medicare Other | Admitting: Otolaryngology

## 2011-06-27 DIAGNOSIS — R490 Dysphonia: Secondary | ICD-10-CM

## 2011-06-27 DIAGNOSIS — R070 Pain in throat: Secondary | ICD-10-CM

## 2011-08-26 ENCOUNTER — Other Ambulatory Visit (HOSPITAL_COMMUNITY): Payer: Self-pay | Admitting: Internal Medicine

## 2011-08-26 ENCOUNTER — Ambulatory Visit (HOSPITAL_COMMUNITY)
Admission: RE | Admit: 2011-08-26 | Discharge: 2011-08-26 | Disposition: A | Discharge status: Home / Self Care | Payer: Medicare Other | Source: Ambulatory Visit | Attending: Internal Medicine | Admitting: Internal Medicine

## 2011-08-26 DIAGNOSIS — Z85118 Personal history of other malignant neoplasm of bronchus and lung: Secondary | ICD-10-CM | POA: Insufficient documentation

## 2011-08-26 DIAGNOSIS — R079 Chest pain, unspecified: Secondary | ICD-10-CM

## 2011-08-26 DIAGNOSIS — R1013 Epigastric pain: Secondary | ICD-10-CM | POA: Insufficient documentation

## 2011-08-26 DIAGNOSIS — Z951 Presence of aortocoronary bypass graft: Secondary | ICD-10-CM | POA: Insufficient documentation

## 2011-08-28 ENCOUNTER — Other Ambulatory Visit: Payer: Self-pay | Admitting: Internal Medicine

## 2011-08-28 ENCOUNTER — Other Ambulatory Visit (HOSPITAL_COMMUNITY): Payer: Self-pay | Admitting: Internal Medicine

## 2011-08-28 DIAGNOSIS — I10 Essential (primary) hypertension: Secondary | ICD-10-CM

## 2011-08-28 DIAGNOSIS — R079 Chest pain, unspecified: Secondary | ICD-10-CM

## 2011-08-28 DIAGNOSIS — I2510 Atherosclerotic heart disease of native coronary artery without angina pectoris: Secondary | ICD-10-CM

## 2011-08-28 DIAGNOSIS — E785 Hyperlipidemia, unspecified: Secondary | ICD-10-CM

## 2011-08-29 ENCOUNTER — Ambulatory Visit (HOSPITAL_COMMUNITY)
Admission: RE | Admit: 2011-08-29 | Discharge: 2011-08-29 | Disposition: A | Discharge status: Home / Self Care | Payer: Medicare Other | Source: Ambulatory Visit | Attending: Internal Medicine | Admitting: Internal Medicine

## 2011-08-29 DIAGNOSIS — D320 Benign neoplasm of cerebral meninges: Secondary | ICD-10-CM | POA: Insufficient documentation

## 2011-08-29 DIAGNOSIS — R079 Chest pain, unspecified: Secondary | ICD-10-CM | POA: Insufficient documentation

## 2011-08-29 DIAGNOSIS — E785 Hyperlipidemia, unspecified: Secondary | ICD-10-CM

## 2011-08-29 DIAGNOSIS — R42 Dizziness and giddiness: Secondary | ICD-10-CM | POA: Insufficient documentation

## 2011-08-29 DIAGNOSIS — R7989 Other specified abnormal findings of blood chemistry: Secondary | ICD-10-CM | POA: Insufficient documentation

## 2011-08-29 DIAGNOSIS — I10 Essential (primary) hypertension: Secondary | ICD-10-CM

## 2011-08-29 DIAGNOSIS — I2510 Atherosclerotic heart disease of native coronary artery without angina pectoris: Secondary | ICD-10-CM

## 2011-08-29 MED ORDER — IOHEXOL 350 MG/ML SOLN
100.0000 mL | Freq: Once | INTRAVENOUS | Status: AC | PRN
  Administered 2011-08-29: 100 mL via INTRAVENOUS

## 2011-09-16 ENCOUNTER — Other Ambulatory Visit: Payer: Self-pay | Admitting: Cardiology

## 2011-11-15 ENCOUNTER — Emergency Department (HOSPITAL_COMMUNITY)
Admission: EM | Admit: 2011-11-15 | Discharge: 2011-11-16 | Disposition: A | Discharge status: Home / Self Care | Payer: Medicare Other | Attending: Emergency Medicine | Admitting: Emergency Medicine

## 2011-11-15 ENCOUNTER — Encounter (HOSPITAL_COMMUNITY): Payer: Self-pay | Admitting: *Deleted

## 2011-11-15 DIAGNOSIS — Z85118 Personal history of other malignant neoplasm of bronchus and lung: Secondary | ICD-10-CM | POA: Insufficient documentation

## 2011-11-15 DIAGNOSIS — Z8546 Personal history of malignant neoplasm of prostate: Secondary | ICD-10-CM | POA: Insufficient documentation

## 2011-11-15 DIAGNOSIS — Z87891 Personal history of nicotine dependence: Secondary | ICD-10-CM | POA: Insufficient documentation

## 2011-11-15 DIAGNOSIS — N390 Urinary tract infection, site not specified: Secondary | ICD-10-CM | POA: Insufficient documentation

## 2011-11-15 DIAGNOSIS — R339 Retention of urine, unspecified: Secondary | ICD-10-CM

## 2011-11-15 DIAGNOSIS — E119 Type 2 diabetes mellitus without complications: Secondary | ICD-10-CM | POA: Insufficient documentation

## 2011-11-15 LAB — URINALYSIS, ROUTINE W REFLEX MICROSCOPIC
Glucose, UA: NEGATIVE mg/dL
Ketones, ur: NEGATIVE mg/dL
pH: 5.5 (ref 5.0–8.0)

## 2011-11-15 LAB — URINE MICROSCOPIC-ADD ON

## 2011-11-15 MED ORDER — HYDROMORPHONE HCL PF 1 MG/ML IJ SOLN
1.0000 mg | Freq: Once | INTRAMUSCULAR | Status: AC
  Administered 2011-11-16: 1 mg via INTRAVENOUS
  Filled 2011-11-15: qty 1

## 2011-11-15 MED ORDER — ONDANSETRON HCL 4 MG/2ML IJ SOLN
4.0000 mg | Freq: Once | INTRAMUSCULAR | Status: AC
  Administered 2011-11-16: 4 mg via INTRAVENOUS
  Filled 2011-11-15: qty 2

## 2011-11-15 MED ORDER — CIPROFLOXACIN IN D5W 200 MG/100ML IV SOLN
200.0000 mg | Freq: Once | INTRAVENOUS | Status: AC
  Administered 2011-11-16: 200 mg via INTRAVENOUS
  Filled 2011-11-15: qty 100

## 2011-11-15 MED ORDER — SODIUM CHLORIDE 0.9 % IV BOLUS (SEPSIS)
500.0000 mL | Freq: Once | INTRAVENOUS | Status: AC
  Administered 2011-11-16: via INTRAVENOUS

## 2011-11-15 NOTE — ED Provider Notes (Signed)
History  This chart was scribed for [employer]. Colon Branch, MD by Erskine Emery. This patient was seen in room APA18/APA18 and the patient's care was started at 23:18.   CSN: [id number]  Arrival date & time 11/15/11  2123   First MD Initiated Contact with Patient 11/15/11 2318      Chief Complaint  Patient presents with  . Urinary Retention    (Consider location/radiation/quality/duration/timing/severity/associated sxs/prior treatment) The history is provided by the patient. No language interpreter was used.  Thomas [name] is a 78 y.o. male with a remote h/o prostate cancer who presents to the Emergency Department complaining of urinary retention and bloating all day. Pt denies any associated wheezing, cough, fever, chills, diarrhea, nausea, or emesis. Pt reports his prostate cancer was diagnosed about 20 years ago and treated with surgery but no chemo. Pt denies seeing a specialist recently or telling his PCP about the symptoms.  Dr. Sherwood Gambler is the pt's PCP.  Past Medical History  Diagnosis Date  . Diabetes mellitus   . Anginal pain   . Hyperlipemia   . AAA (abdominal aortic aneurysm)   . Cancer   . Lung cancer   . Prostate cancer     Past Surgical History  Procedure Date  . Back surgery   . Tumor removal     from lung  . Prostate surgery   . Cardiac surgery   . Surgery to repair aneurysm     History reviewed. No pertinent family history.  History  Substance Use Topics  . Smoking status: Former Smoker -- 1.0 packs/day for 50 years    Types: Cigarettes    Quit date: 12/10/1993  . Smokeless tobacco: Current User  . Alcohol Use: No      Review of Systems  Constitutional: Negative for fever.       10 Systems reviewed and are negative for acute change except as noted in the HPI.  HENT: Negative for congestion.   Eyes: Negative for discharge and redness.  Respiratory: Negative for cough and shortness of breath.   Cardiovascular: Negative for chest pain.    Gastrointestinal: Positive for abdominal pain. Negative for vomiting.  Genitourinary: Positive for decreased urine volume and difficulty urinating.  Musculoskeletal: Negative for back pain.  Skin: Negative for rash.  Neurological: Negative for syncope, numbness and headaches.  Psychiatric/Behavioral:       No behavior change.   A complete 10 system review of systems was obtained and all systems are negative except as noted in the HPI and PMH.    Allergies  Review of patient's allergies indicates no known allergies.  Home Medications   Current Outpatient Rx  Name Route Sig Dispense Refill  . ALPRAZOLAM 1 MG PO TABS Oral Take 1 mg by mouth at bedtime as needed. For sleep    . ASPIRIN 325 MG PO TABS Oral Take 325 mg by mouth daily.      . COD LIVER OIL PO CAPS Oral Take 1 capsule by mouth 2 (two) times daily.     Marland Kitchen DILTIAZEM HCL ER COATED BEADS 240 MG PO CP24 Oral Take 240 mg by mouth every morning.    Marland Kitchen LANTUS 100 UNIT/ML [place] SOLN Subcutaneous Inject into the skin at bedtime. Per sliding scale.    Marland Kitchen NIACIN ER 500 MG PO TBCR Oral Take 1,500 mg by mouth every morning.     Marland Kitchen FISH OIL 1200 MG PO CAPS Oral Take 1 capsule by mouth 2 (two) times daily.     Marland Kitchen  PRAVASTATIN SODIUM 80 MG PO TABS Oral Take 80 mg by mouth every evening.    Marland Kitchen NITROGLYCERIN 0.4 MG SL SUBL Sublingual Place 1 tablet (0.4 mg total) under the tongue every 5 (five) minutes as needed for chest pain. 25 tablet 3    Triage Vitals: BP 148/83  Pulse 78  Temp 97.7 F (36.5 C) (Oral)  Resp 20  Ht 5\' 11"  (1.803 m)  Wt 195 lb (88.451 kg)  BMI 27.20 kg/m2  SpO2 98%  Physical Exam  Nursing note and vitals reviewed. Constitutional: He is oriented to person, place, and time. He appears well-developed and well-nourished. No distress.  HENT:  Head: Normocephalic and atraumatic.  Eyes: EOM are normal. Pupils are equal, round, and reactive to light.  Neck: Neck supple. No tracheal deviation present.  Cardiovascular: Normal  rate, regular rhythm and normal heart sounds.   Pulmonary/Chest: Effort normal. No respiratory distress. He has wheezes.       Expiratory wheezing on both sides.  Abdominal: Soft. He exhibits no distension. There is tenderness. There is no rebound.       Mild lower abdominal tenderness.  Musculoskeletal: Normal range of motion. He exhibits no edema.       No CVA tenderness.  Neurological: He is alert and oriented to person, place, and time.  Skin: Skin is warm and dry.  Psychiatric: He has a normal mood and affect.    ED Course  Procedures (including critical care time)  Results for orders placed during the hospital encounter of 11/15/11  URINALYSIS, ROUTINE W REFLEX MICROSCOPIC      Component Value Range   Color, Urine RED (*) YELLOW   APPearance CLOUDY (*) CLEAR   Specific Gravity, Urine 1.020  1.005 - 1.030   pH 5.5  5.0 - 8.0   Glucose, UA NEGATIVE  NEGATIVE mg/dL   Hgb urine dipstick LARGE (*) NEGATIVE   Bilirubin Urine SMALL (*) NEGATIVE   Ketones, ur NEGATIVE  NEGATIVE mg/dL   Protein, ur 30 (*) NEGATIVE mg/dL   Urobilinogen, UA 1.0  0.0 - 1.0 mg/dL   Nitrite POSITIVE (*) NEGATIVE   Leukocytes, UA TRACE (*) NEGATIVE  URINE MICROSCOPIC-ADD ON      Component Value Range   Squamous Epithelial / LPF RARE  RARE   WBC, UA 3-6  <3 WBC/hpf   RBC / HPF TOO NUMEROUS TO COUNT  <3 RBC/hpf   Bacteria, UA FEW (*) RARE  CBC WITH DIFFERENTIAL      Component Value Range   WBC 9.2  4.0 - 10.5 K/uL   RBC 4.63  4.22 - 5.81 MIL/uL   Hemoglobin 14.7  13.0 - 17.0 g/dL   HCT 16.1  09.6 - 04.5 %   MCV 91.1  78.0 - 100.0 fL   MCH 31.7  26.0 - 34.0 pg   MCHC 34.8  30.0 - 36.0 g/dL   RDW 40.9  81.1 - 91.4 %   Platelets 152  150 - 400 K/uL   Neutrophils Relative 71  43 - 77 %   Neutro Abs 6.6  1.7 - 7.7 K/uL   Lymphocytes Relative 19  12 - 46 %   Lymphs Abs 1.7  0.7 - 4.0 K/uL   Monocytes Relative 8  3 - 12 %   Monocytes Absolute 0.8  0.1 - 1.0 K/uL   Eosinophils Relative 2  0 - 5 %    Eosinophils Absolute 0.2  0.0 - 0.7 K/uL   Basophils Relative 0  0 -  1 %   Basophils Absolute 0.0  0.0 - 0.1 K/uL  COMPREHENSIVE METABOLIC PANEL      Component Value Range   Sodium 141  135 - 145 mEq/L   Potassium 3.6  3.5 - 5.1 mEq/L   Chloride 105  96 - 112 mEq/L   CO2 26  19 - 32 mEq/L   Glucose, Bld 108 (*) 70 - 99 mg/dL   BUN 14  6 - 23 mg/dL   Creatinine, Ser 1.61  0.50 - 1.35 mg/dL   Calcium 9.7  8.4 - 09.6 mg/dL   Total Protein 7.0  6.0 - 8.3 g/dL   Albumin 3.7  3.5 - 5.2 g/dL   AST 19  0 - 37 U/L   ALT 15  0 - 53 U/L   Alkaline Phosphatase 59  39 - 117 U/L   Total Bilirubin 0.5  0.3 - 1.2 mg/dL   GFR calc non Af Amer 75 (*) >90 mL/min   GFR calc Af Amer 87 (*) >90 mL/min   DIAGNOSTIC STUDIES: Oxygen Saturation is 98% on room air, normal by my interpretation.    COORDINATION OF CARE: 23:25--I evaluated the patient and we discussed a treatment plan including blood work, IV fluids,  catheterization, and medication to which the pt agreed.  I notified the pt that his labs show he does have blood in his urine.  Pt reports he would like some diet coke so I brought it to him.    No results found.   No diagnosis found.    MDM  Patient with urinary hesitation, poor urinary flow, lower abdominal pressure and discomfort. Foley catheter placed with relief of bladder pressure. UA positive for infection. Culture pending. Initiated antibiotic therapy. Referral to urology.Foley catheter to remain in place until follow up with urologist, Dr. Jerre Simon. Dx testing d/w pt and family.  Questions answered.  Verb understanding, agreeable to d/c home with outpt f/u. Pt feels improved after observation and/or treatment in ED.Pt stable in ED with no significant deterioration in condition.The patient appears reasonably screened and/or stabilized for discharge and I doubt any other medical condition or other [facility] requiring further screening, evaluation, or treatment in the ED at this time prior to  discharge.  I personally performed the services described in this documentation, which was scribed in my presence. The recorded information has been reviewed and considered.   MDM Reviewed: nursing note and vitals Interpretation: labs            Nicoletta Dress. Colon Branch, MD 11/16/11 0600

## 2011-11-15 NOTE — ED Notes (Signed)
Urinary retention "all day"  Had been straining to urinate.

## 2011-11-16 LAB — COMPREHENSIVE METABOLIC PANEL
AST: 19 U/L (ref 0–37)
Albumin: 3.7 g/dL (ref 3.5–5.2)
Alkaline Phosphatase: 59 U/L (ref 39–117)
Chloride: 105 mEq/L (ref 96–112)
Creatinine, Ser: 0.97 mg/dL (ref 0.50–1.35)
Potassium: 3.6 mEq/L (ref 3.5–5.1)
Total Bilirubin: 0.5 mg/dL (ref 0.3–1.2)
Total Protein: 7 g/dL (ref 6.0–8.3)

## 2011-11-16 LAB — CBC WITH DIFFERENTIAL/PLATELET
Basophils Absolute: 0 10*3/uL (ref 0.0–0.1)
Basophils Relative: 0 % (ref 0–1)
Eosinophils Absolute: 0.2 10*3/uL (ref 0.0–0.7)
MCHC: 34.8 g/dL (ref 30.0–36.0)
Neutro Abs: 6.6 10*3/uL (ref 1.7–7.7)
Neutrophils Relative %: 71 % (ref 43–77)
RDW: 13.7 % (ref 11.5–15.5)

## 2011-11-16 MED ORDER — CIPROFLOXACIN HCL 500 MG PO TABS: 250.0000 mg | ORAL_TABLET | Freq: Two times a day (BID) | ORAL | Status: DC

## 2011-11-16 MED ORDER — ONDANSETRON HCL 4 MG PO TABS: 4.0000 mg | ORAL_TABLET | Freq: Four times a day (QID) | ORAL | Status: DC

## 2011-11-16 MED ORDER — HYDROCODONE-ACETAMINOPHEN 5-325 MG PO TABS: 1.0000 | ORAL_TABLET | ORAL | Status: AC | PRN

## 2011-11-17 LAB — URINE CULTURE
Colony Count: NO GROWTH
Culture: NO GROWTH

## 2011-11-27 ENCOUNTER — Emergency Department (HOSPITAL_COMMUNITY): Payer: Medicare Other

## 2011-11-27 ENCOUNTER — Emergency Department (HOSPITAL_COMMUNITY)
Admission: EM | Admit: 2011-11-27 | Discharge: 2011-11-27 | Disposition: A | Discharge status: Home / Self Care | Payer: Medicare Other | Attending: Emergency Medicine | Admitting: Emergency Medicine

## 2011-11-27 ENCOUNTER — Encounter (HOSPITAL_COMMUNITY): Payer: Self-pay | Admitting: Emergency Medicine

## 2011-11-27 DIAGNOSIS — Z794 Long term (current) use of insulin: Secondary | ICD-10-CM | POA: Insufficient documentation

## 2011-11-27 DIAGNOSIS — Z8546 Personal history of malignant neoplasm of prostate: Secondary | ICD-10-CM | POA: Insufficient documentation

## 2011-11-27 DIAGNOSIS — G319 Degenerative disease of nervous system, unspecified: Secondary | ICD-10-CM | POA: Insufficient documentation

## 2011-11-27 DIAGNOSIS — Z7982 Long term (current) use of aspirin: Secondary | ICD-10-CM | POA: Insufficient documentation

## 2011-11-27 DIAGNOSIS — R002 Palpitations: Secondary | ICD-10-CM | POA: Insufficient documentation

## 2011-11-27 DIAGNOSIS — Z9889 Other specified postprocedural states: Secondary | ICD-10-CM | POA: Insufficient documentation

## 2011-11-27 DIAGNOSIS — Z87891 Personal history of nicotine dependence: Secondary | ICD-10-CM | POA: Insufficient documentation

## 2011-11-27 DIAGNOSIS — E785 Hyperlipidemia, unspecified: Secondary | ICD-10-CM | POA: Insufficient documentation

## 2011-11-27 DIAGNOSIS — E119 Type 2 diabetes mellitus without complications: Secondary | ICD-10-CM | POA: Insufficient documentation

## 2011-11-27 DIAGNOSIS — R202 Paresthesia of skin: Secondary | ICD-10-CM

## 2011-11-27 DIAGNOSIS — R209 Unspecified disturbances of skin sensation: Secondary | ICD-10-CM | POA: Insufficient documentation

## 2011-11-27 DIAGNOSIS — Z79899 Other long term (current) drug therapy: Secondary | ICD-10-CM | POA: Insufficient documentation

## 2011-11-27 DIAGNOSIS — Z85118 Personal history of other malignant neoplasm of bronchus and lung: Secondary | ICD-10-CM | POA: Insufficient documentation

## 2011-11-27 LAB — CBC WITH DIFFERENTIAL/PLATELET
Basophils Absolute: 0 10*3/uL (ref 0.0–0.1)
Basophils Relative: 1 % (ref 0–1)
Eosinophils Absolute: 0.2 10*3/uL (ref 0.0–0.7)
Hemoglobin: 14.3 g/dL (ref 13.0–17.0)
MCHC: 35 g/dL (ref 30.0–36.0)
Monocytes Relative: 7 % (ref 3–12)
Neutro Abs: 6 10*3/uL (ref 1.7–7.7)
Neutrophils Relative %: 69 % (ref 43–77)
Platelets: 167 10*3/uL (ref 150–400)

## 2011-11-27 LAB — BASIC METABOLIC PANEL
Chloride: 102 mEq/L (ref 96–112)
GFR calc Af Amer: 65 mL/min — ABNORMAL LOW (ref >90)
GFR calc non Af Amer: 56 mL/min — ABNORMAL LOW (ref >90)
Potassium: 3.9 mEq/L (ref 3.5–5.1)
Sodium: 138 mEq/L (ref 135–145)

## 2011-11-27 LAB — GLUCOSE, CAPILLARY: Glucose-Capillary: 169 mg/dL — ABNORMAL HIGH (ref 70–99)

## 2011-11-27 LAB — APTT: aPTT: 30 seconds (ref 24–37)

## 2011-11-27 LAB — TROPONIN I: Troponin I: <0.3 ng/mL (ref <0.30)

## 2011-11-27 NOTE — ED Notes (Signed)
Pt alert & oriented x4, stable gait. Patient given discharge instructions, paperwork & prescription(s). Patient  instructed to stop at the registration desk to finish any additional paperwork. Patient verbalized understanding. Pt left department w/ no further questions. 
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
## 2011-11-27 NOTE — ED Notes (Signed)
Pt c/o L sided tingling and numbness intermittant x 2 days. Denies any now. Pt is alert/oriented. C/o palpitations also and has been worse today.

## 2011-11-27 NOTE — ED Notes (Signed)
Gave patient diet coke and peanut butter crackers at MD request.

## 2011-12-25 ENCOUNTER — Ambulatory Visit: Payer: Medicare Other | Admitting: Cardiology

## 2012-02-18 ENCOUNTER — Ambulatory Visit
Admission: RE | Admit: 2012-02-18 | Discharge: 2012-02-18 | Disposition: A | Discharge status: Home / Self Care | Payer: Medicare Other | Source: Ambulatory Visit | Attending: Surgery | Admitting: Surgery

## 2012-02-18 ENCOUNTER — Encounter: Payer: Self-pay | Admitting: Surgery

## 2012-02-18 ENCOUNTER — Ambulatory Visit (INDEPENDENT_AMBULATORY_CARE_PROVIDER_SITE_OTHER): Payer: Medicare Other | Admitting: Surgery

## 2012-02-18 ENCOUNTER — Other Ambulatory Visit: Payer: Self-pay | Admitting: *Deleted

## 2012-02-18 VITALS — BP 118/69 | HR 62 | Resp 18 | Ht 71.0 in | Wt 194.0 lb

## 2012-02-18 DIAGNOSIS — I2510 Atherosclerotic heart disease of native coronary artery without angina pectoris: Secondary | ICD-10-CM

## 2012-02-18 DIAGNOSIS — Z09 Encounter for follow-up examination after completed treatment for conditions other than malignant neoplasm: Secondary | ICD-10-CM

## 2012-02-18 DIAGNOSIS — Z85118 Personal history of other malignant neoplasm of bronchus and lung: Secondary | ICD-10-CM
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
## 2012-03-25 ENCOUNTER — Encounter: Payer: Self-pay | Admitting: Cardiology

## 2012-03-25 ENCOUNTER — Ambulatory Visit (INDEPENDENT_AMBULATORY_CARE_PROVIDER_SITE_OTHER): Payer: Medicare Other | Admitting: Cardiology

## 2012-03-25 VITALS — BP 117/58 | HR 64 | Ht 71.0 in | Wt 199.0 lb

## 2012-03-25 DIAGNOSIS — J449 Chronic obstructive pulmonary disease, unspecified: Secondary | ICD-10-CM

## 2012-03-25 DIAGNOSIS — I4510 Unspecified right bundle-branch block: Secondary | ICD-10-CM

## 2012-03-25 MED ORDER — PRAVASTATIN SODIUM 80 MG PO TABS: 80.0000 mg | ORAL_TABLET | Freq: Every evening | ORAL | Status: DC

## 2012-03-25 NOTE — Assessment & Plan Note (Signed)
Patient is running out of his pravastatin. Prescription redone. Annual fasting lipid profile and LFTs.

## 2012-03-25 NOTE — Progress Notes (Signed)
HPI Thomas [name] returns today for evaluation and management coronary artery disease. He's had previous bypass surgery.  He denies any angina. He does has some fleeting well localized left parasternal sharp chest discomfort that comes and goes. It is not associated with activity. It does not hurt with a deep breath and does not sound pleuritic. He denies hemoptysis. He's had a previous history of lung cancer but this is stable and follow Dr. Laneta Simmers every January.  He denies any orthopnea, PND or edema. He needs renewal on his pravastatin. Blood work followed by primary care.  He denies any presyncope or syncope. He occasionally will have a palpitation.  Past Medical History  Diagnosis Date  . Diabetes mellitus   . Anginal pain   . Hyperlipemia   . Cancer   . Lung cancer   . Prostate cancer     Current Outpatient Prescriptions  Medication Sig Dispense Refill  . ALPRAZolam (XANAX) 1 MG tablet Take 1 mg by mouth at bedtime as needed. For sleep      . aspirin 325 MG tablet Take 325 mg by mouth daily.        Marland Kitchen Cod Liver Oil CAPS Take 1 capsule by mouth 2 (two) times daily.       Marland Kitchen diltiazem (CARDIZEM CD) 240 MG 24 hr capsule Take 240 mg by mouth every morning.      Marland Kitchen LANTUS 100 UNIT/ML injection Inject into the skin at bedtime. Per sliding scale.      . niacin (SLO-NIACIN) 500 MG tablet Take 1,500 mg by mouth every morning.       . nitroGLYCERIN (NITROSTAT) 0.4 MG SL tablet Place 1 tablet (0.4 mg total) under the tongue every 5 (five) minutes as needed for chest pain.  25 tablet  3  . Omega-3 Fatty Acids (FISH OIL) 1200 MG CAPS Take 1 capsule by mouth 2 (two) times daily.       . pravastatin (PRAVACHOL) 80 MG tablet Take 1 tablet (80 mg total) by mouth every evening.  30 tablet  11   No current facility-administered medications for this visit.    No Known Allergies  History reviewed. No pertinent family history.  History   Social History  . Marital Status: Married    Spouse Name:  N/A    Number of Children: N/A  . Years of Education: N/A   Occupational History  . Not on file.   Social History Main Topics  . Smoking status: Former Smoker -- 1.00 packs/day for 50 years    Types: Cigarettes    Quit date: 12/10/1993  . Smokeless tobacco: Current User  . Alcohol Use: No  . Drug Use: No  . Sexually Active: Not on file   Other Topics Concern  . Not on file   Social History Narrative  . No narrative on file    ROS ALL NEGATIVE EXCEPT THOSE NOTED IN HPI  PE  General Appearance: well developed, well nourished in no acute distress, frail, elderly HEENT: symmetrical face, PERRLA,  Neck: no JVD, thyromegaly, or adenopathy, trachea midline Chest: symmetric without deformity Cardiac: PMI non-displaced, RRR, normal S1, S2, no gallop or murmur Lung: Inspiratory expiratory fine rhonchi Vascular: Pulses reduced in lower extremities Abdominal: nondistended, nontender, good bowel sounds, no HSM, no bruits Extremities: no cyanosis, clubbing or edema, no sign of DVT, no varicosities  Skin: normal color, no rashes Neuro: alert and oriented x 3, non-focal Pysch: normal affect  EKG Normal sinus rhythm, bifascicular block with a  right bundle branch block and left anterior fascicular block.  BMET    Component Value Date/Time   NA 138 11/27/2011 1801   K 3.9 11/27/2011 1801   CL 102 11/27/2011 1801   CO2 26 11/27/2011 1801   GLUCOSE 177* 11/27/2011 1801   BUN 16 11/27/2011 1801   CREATININE 1.18 11/27/2011 1801   CALCIUM 9.6 11/27/2011 1801   GFRNONAA 56* 11/27/2011 1801   GFRAA 65* 11/27/2011 1801    Lipid Panel     Component Value Date/Time   CHOL 150 07/27/2010 0400   TRIG 211* 07/27/2010 0400   HDL 40 07/27/2010 0400   CHOLHDL 3.8 07/27/2010 0400   VLDL 42* 07/27/2010 0400   LDLCALC 68 07/27/2010 0400    CBC    Component Value Date/Time   WBC 8.7 11/27/2011 1801   RBC 4.46 11/27/2011 1801   HGB 14.3 11/27/2011 1801   HCT 40.8 11/27/2011 1801   PLT  167 11/27/2011 1801   MCV 91.5 11/27/2011 1801   MCH 32.1 11/27/2011 1801   MCHC 35.0 11/27/2011 1801   RDW 13.7 11/27/2011 1801   LYMPHSABS 1.9 11/27/2011 1801   MONOABS 0.6 11/27/2011 1801   EOSABS 0.2 11/27/2011 1801   BASOSABS 0.0 11/27/2011 1801

## 2012-03-25 NOTE — Assessment & Plan Note (Signed)
Sharp stabbing well localized discomfort noncardiac. Patient reassured.

## 2012-03-25 NOTE — Assessment & Plan Note (Signed)
Stable. Continue secondary preventative therapy. Return the office in one year. 

## 2012-03-25 NOTE — Patient Instructions (Addendum)
Your physician recommends that you schedule a follow-up appointment in: ONE YEAR 

## 2012-05-12 ENCOUNTER — Other Ambulatory Visit: Payer: Self-pay | Admitting: *Deleted

## 2012-05-12 MED ORDER — DILTIAZEM HCL ER COATED BEADS 240 MG PO CP24: 240.0000 mg | ORAL_CAPSULE | Freq: Every morning | ORAL | Status: DC

## 2012-09-07 ENCOUNTER — Other Ambulatory Visit: Payer: Self-pay

## 2012-09-07 ENCOUNTER — Emergency Department (HOSPITAL_COMMUNITY): Payer: Medicare Other

## 2012-09-07 ENCOUNTER — Observation Stay (HOSPITAL_COMMUNITY): Payer: Medicare Other

## 2012-09-07 ENCOUNTER — Encounter (HOSPITAL_COMMUNITY): Payer: Self-pay | Admitting: *Deleted

## 2012-09-07 ENCOUNTER — Observation Stay (HOSPITAL_COMMUNITY)
Admission: EM | Admit: 2012-09-07 | Discharge: 2012-09-10 | Disposition: A | Discharge status: Skilled Nursing Facility | Payer: Medicare Other | Attending: Family Medicine | Admitting: Family Medicine

## 2012-09-07 DIAGNOSIS — I25810 Atherosclerosis of coronary artery bypass graft(s) without angina pectoris: Secondary | ICD-10-CM | POA: Diagnosis present

## 2012-09-07 DIAGNOSIS — R7401 Elevation of levels of liver transaminase levels: Secondary | ICD-10-CM | POA: Insufficient documentation

## 2012-09-07 DIAGNOSIS — D696 Thrombocytopenia, unspecified: Secondary | ICD-10-CM | POA: Insufficient documentation

## 2012-09-07 DIAGNOSIS — M549 Dorsalgia, unspecified: Secondary | ICD-10-CM

## 2012-09-07 DIAGNOSIS — G9340 Encephalopathy, unspecified: Principal | ICD-10-CM | POA: Diagnosis present

## 2012-09-07 DIAGNOSIS — J449 Chronic obstructive pulmonary disease, unspecified: Secondary | ICD-10-CM | POA: Diagnosis present

## 2012-09-07 DIAGNOSIS — N179 Acute kidney failure, unspecified: Secondary | ICD-10-CM

## 2012-09-07 DIAGNOSIS — E785 Hyperlipidemia, unspecified: Secondary | ICD-10-CM | POA: Diagnosis present

## 2012-09-07 DIAGNOSIS — R7402 Elevation of levels of lactic acid dehydrogenase (LDH): Secondary | ICD-10-CM | POA: Insufficient documentation

## 2012-09-07 DIAGNOSIS — I452 Bifascicular block: Secondary | ICD-10-CM | POA: Diagnosis present

## 2012-09-07 DIAGNOSIS — E860 Dehydration: Secondary | ICD-10-CM

## 2012-09-07 DIAGNOSIS — G8929 Other chronic pain: Secondary | ICD-10-CM

## 2012-09-07 DIAGNOSIS — IMO0001 Reserved for inherently not codable concepts without codable children: Secondary | ICD-10-CM | POA: Diagnosis present

## 2012-09-07 DIAGNOSIS — R410 Disorientation, unspecified: Secondary | ICD-10-CM

## 2012-09-07 DIAGNOSIS — E119 Type 2 diabetes mellitus without complications: Secondary | ICD-10-CM

## 2012-09-07 DIAGNOSIS — R739 Hyperglycemia, unspecified: Secondary | ICD-10-CM

## 2012-09-07 LAB — CBC WITH DIFFERENTIAL/PLATELET
Lymphocytes Relative: 11 % — ABNORMAL LOW (ref 12–46)
Lymphs Abs: 0.9 10*3/uL (ref 0.7–4.0)
Neutro Abs: 6.9 10*3/uL (ref 1.7–7.7)
Neutrophils Relative %: 80 % — ABNORMAL HIGH (ref 43–77)
Platelets: 136 10*3/uL — ABNORMAL LOW (ref 150–400)
RBC: 4.58 MIL/uL (ref 4.22–5.81)
WBC: 8.7 10*3/uL (ref 4.0–10.5)

## 2012-09-07 LAB — LACTIC ACID, PLASMA: Lactic Acid, Venous: 2.2 mmol/L (ref 0.5–2.2)

## 2012-09-07 LAB — GLUCOSE, CAPILLARY: Glucose-Capillary: 247 mg/dL — ABNORMAL HIGH (ref 70–99)

## 2012-09-07 LAB — RAPID URINE DRUG SCREEN, HOSP PERFORMED
Barbiturates: NOT DETECTED
Cocaine: NOT DETECTED
Tetrahydrocannabinol: NOT DETECTED

## 2012-09-07 LAB — COMPREHENSIVE METABOLIC PANEL
ALT: 66 U/L — ABNORMAL HIGH (ref 0–53)
Alkaline Phosphatase: 62 U/L (ref 39–117)
CO2: 22 mEq/L (ref 19–32)
GFR calc Af Amer: 51 mL/min — ABNORMAL LOW (ref >90)
GFR calc non Af Amer: 44 mL/min — ABNORMAL LOW (ref >90)
Glucose, Bld: 290 mg/dL — ABNORMAL HIGH (ref 70–99)
Potassium: 4 mEq/L (ref 3.5–5.1)
Sodium: 135 mEq/L (ref 135–145)

## 2012-09-07 LAB — URINALYSIS, ROUTINE W REFLEX MICROSCOPIC
Bilirubin Urine: NEGATIVE
Glucose, UA: 500 mg/dL — AB
Hgb urine dipstick: NEGATIVE
Specific Gravity, Urine: >1.03 — ABNORMAL HIGH (ref 1.005–1.030)
Urobilinogen, UA: 0.2 mg/dL (ref 0.0–1.0)

## 2012-09-07 LAB — AMMONIA: Ammonia: 23 umol/L (ref 11–60)

## 2012-09-07 MED ORDER — SODIUM CHLORIDE 0.9 % IV BOLUS (SEPSIS)
500.0000 mL | Freq: Once | INTRAVENOUS | Status: AC
  Administered 2012-09-07: 500 mL via INTRAVENOUS

## 2012-09-07 MED ORDER — SODIUM CHLORIDE 0.9 % IJ SOLN
3.0000 mL | Freq: Two times a day (BID) | INTRAMUSCULAR | Status: DC
  Administered 2012-09-07 – 2012-09-10 (×4): 3 mL via INTRAVENOUS

## 2012-09-07 MED ORDER — ALPRAZOLAM 1 MG PO TABS
1.0000 mg | ORAL_TABLET | Freq: Every evening | ORAL | Status: DC | PRN
  Administered 2012-09-07 – 2012-09-10 (×3): 1 mg via ORAL
  Filled 2012-09-07 (×5): qty 1

## 2012-09-07 MED ORDER — GADOBENATE DIMEGLUMINE 529 MG/ML IV SOLN
9.0000 mL | Freq: Once | INTRAVENOUS | Status: AC | PRN
  Administered 2012-09-07: 9 mL via INTRAVENOUS

## 2012-09-07 MED ORDER — INSULIN GLARGINE 100 UNIT/ML ~~LOC~~ SOLN
50.0000 [IU] | Freq: Every day | SUBCUTANEOUS | Status: DC
  Administered 2012-09-07 – 2012-09-08 (×2): 50 [IU] via SUBCUTANEOUS
  Filled 2012-09-07 (×3): qty 0.5

## 2012-09-07 MED ORDER — ALBUTEROL SULFATE 2 MG PO TABS
4.0000 mg | ORAL_TABLET | Freq: Three times a day (TID) | ORAL | Status: DC
  Administered 2012-09-08 – 2012-09-10 (×6): 4 mg via ORAL
  Filled 2012-09-07: qty 1
  Filled 2012-09-07: qty 2
  Filled 2012-09-07 (×3): qty 1
  Filled 2012-09-07 (×2): qty 2
  Filled 2012-09-07: qty 1
  Filled 2012-09-07 (×3): qty 2

## 2012-09-07 MED ORDER — SIMVASTATIN 20 MG PO TABS: 40.0000 mg | ORAL_TABLET | Freq: Every day | ORAL | Status: DC

## 2012-09-07 MED ORDER — ATORVASTATIN CALCIUM 20 MG PO TABS
20.0000 mg | ORAL_TABLET | Freq: Every day | ORAL | Status: DC
  Administered 2012-09-08 – 2012-09-09 (×2): 20 mg via ORAL
  Filled 2012-09-07 (×2): qty 1

## 2012-09-07 MED ORDER — INSULIN GLARGINE 100 UNIT/ML ~~LOC~~ SOLN
SUBCUTANEOUS | Status: AC
  Filled 2012-09-07: qty 10

## 2012-09-07 MED ORDER — HEPARIN SODIUM (PORCINE) 5000 UNIT/ML IJ SOLN: 5000.0000 [IU] | Freq: Three times a day (TID) | INTRAMUSCULAR | Status: DC

## 2012-09-07 MED ORDER — DILTIAZEM HCL ER COATED BEADS 240 MG PO CP24
240.0000 mg | ORAL_CAPSULE | Freq: Every morning | ORAL | Status: DC
  Administered 2012-09-08 – 2012-09-10 (×3): 240 mg via ORAL
  Filled 2012-09-07 (×3): qty 1

## 2012-09-07 MED ORDER — HEPARIN SODIUM (PORCINE) 5000 UNIT/ML IJ SOLN
5000.0000 [IU] | Freq: Three times a day (TID) | INTRAMUSCULAR | Status: DC
  Administered 2012-09-07 – 2012-09-10 (×9): 5000 [IU] via SUBCUTANEOUS
  Filled 2012-09-07 (×9): qty 1

## 2012-09-07 MED ORDER — OMEGA-3-ACID ETHYL ESTERS 1 G PO CAPS
1.0000 g | ORAL_CAPSULE | Freq: Two times a day (BID) | ORAL | Status: DC
  Administered 2012-09-07 – 2012-09-10 (×6): 1 g via ORAL
  Filled 2012-09-07 (×6): qty 1

## 2012-09-07 MED ORDER — SODIUM CHLORIDE 0.9 % IV SOLN
INTRAVENOUS | Status: AC
  Administered 2012-09-07 – 2012-09-08 (×2): via INTRAVENOUS

## 2012-09-07 NOTE — ED Provider Notes (Signed)
CSN: [id number]     Arrival date & time 09/07/12  1301 History  This chart was scribed for Joya Gaskins, MD by Bennett Scrape, ED Scribe. This patient was seen in room APA10/APA10 and the patient's care was started at 1:05 PM.   First MD Initiated Contact with Patient 09/07/12 1302     Chief Complaint  Patient presents with  . Back Pain  . Altered Mental Status    Level 5 Caveat-AMS  The history is provided by the patient. No language interpreter was used.    HPI Comments: KURTIS ANASTASIA is a 79 y.o. male brought in by ambulance, who presents to the Emergency Department complaining of one week of worsening of chronic back pain for has been present for the past 3 years. He states that he has been unable to ambulate secondary to pain. Wife called EMS and also reported AMS. No other details known because pt is unreliable. Pt reports CP and abdominal pain but denies fevers, emesis and diarrhea.   PCP is Dr. Sherwood Gambler  Past Medical History  Diagnosis Date  . Diabetes mellitus   . Anginal pain   . Hyperlipemia   . Cancer   . Lung cancer   . Prostate cancer    Past Surgical History  Procedure Laterality Date  . Back surgery    . Tumor removal      from lung  . Prostate surgery    . Cardiac surgery    . Surgery to repair aneurysm     History reviewed. No pertinent family history. History  Substance Use Topics  . Smoking status: Former Smoker -- 1.00 packs/day for 50 years    Types: Cigarettes    Quit date: 12/10/1993  . Smokeless tobacco: Current User  . Alcohol Use: No    Review of Systems  Unable to perform ROS: Mental status change    Allergies  Review of patient's allergies indicates no known allergies.  Home Medications   Current Outpatient Rx  Name  Route  Sig  Dispense  Refill  . ALPRAZolam (XANAX) 1 MG tablet   Oral   Take 1 mg by mouth at bedtime as needed. For sleep         . aspirin 325 MG tablet   Oral   Take 325 mg by mouth daily.            Marland Kitchen Cod Liver Oil CAPS   Oral   Take 1 capsule by mouth 2 (two) times daily.          Marland Kitchen diltiazem (CARDIZEM CD) 240 MG 24 hr capsule   Oral   Take 1 capsule (240 mg total) by mouth every morning.   90 capsule   3   . LANTUS 100 UNIT/ML injection   Subcutaneous   Inject into the skin at bedtime. Per sliding scale.         . niacin (SLO-NIACIN) 500 MG tablet   Oral   Take 1,500 mg by mouth every morning.          Marland Kitchen EXPIRED: nitroGLYCERIN (NITROSTAT) 0.4 MG SL tablet   Sublingual   Place 1 tablet (0.4 mg total) under the tongue every 5 (five) minutes as needed for chest pain.   25 tablet   3   . Omega-3 Fatty Acids (FISH OIL) 1200 MG CAPS   Oral   Take 1 capsule by mouth 2 (two) times daily.          . pravastatin (  PRAVACHOL) 80 MG tablet   Oral   Take 1 tablet (80 mg total) by mouth every evening.   30 tablet   11    Triage Vitals: BP 118/44  Pulse 83  Temp(Src) 98 F (36.7 C) (Oral)  Resp 22  Ht 5\' 11"  (1.803 m)  Wt 190 lb (86.183 kg)  BMI 26.51 kg/m2  SpO2 99%  Physical Exam  Nursing note and vitals reviewed.  CONSTITUTIONAL: Well developed/well nourished HEAD: Normocephalic/atraumatic EYES: EOMI/PERRL ENMT: Mucous membranes dry NECK: supple no meningeal signs SPINE:entire spine nontender, No bruising/crepitance/stepoffs noted to spine CV: S1/S2 noted, no murmurs/rubs/gallops noted LUNGS: coarse breath sounds bilaterally, no apparent distress ABDOMEN: soft, nontender, no rebound or guarding GU:no cva tenderness, normal appearance, chaperone present NEURO: Pt is awake/alert, no facial droop, no arm drift, mild tremor noted, pt appears confused, unable to recall current date, speaks incoherently  EXTREMITIES: pulses normal, pt has minimal movement of lower extremities, no obvious deformities  SKIN: warm, color normal  ED Course   Procedures  Medications  sodium chloride 0.9 % bolus 500 mL (500 mLs Intravenous New Bag/Given 09/07/12 1441)    DIAGNOSTIC STUDIES: Oxygen Saturation is 99% on 2L [place], normal by my interpretation.    COORDINATION OF CARE: 1:14 PM-Will order CXR, Ct of head, x-ray of lumbar spine CBC panel, CMP and UA  Pt is confused, unclear time of onset. Will follow closely. Pt is reporting back pain. Apparently is a chronic condition. Will obtain plain films of back as well.  2:43 PM-Sister in law is present. States pt lives at home with wife. She reports that the confusion started 3 days ago with hallucinations. She denies any new medications, major falls or illnesses. Discussed admission for AMS with her and she agreed to admission. Pt re-examined and has improved ROM of lower extremities. Pt appears slightly more lucid.    Pt stable Spoke to on call [facility]ad, will admit for delirium Pt stable in the ED   MDM  Nursing notes including past medical history and social history reviewed and considered in documentation Labs/vital reviewed and considered xrays reviewed and considered Previous records reviewed and considered - h/o prostate/lung cancer.       Date: 09/07/2012 1259pm  Rate: 83  Rhythm: normal sinus rhythm  QRS Axis: left  Intervals: normal  ST/T Wave abnormalities: nonspecific ST changes  Conduction Disutrbances:right bundle branch block  Narrative Interpretation:   Old EKG Reviewed: RBBB has been seen in prior    I personally performed the services described in this documentation, which was scribed in my presence. The recorded information has been reviewed and is accurate.        Joya Gaskins, MD 09/07/12 [phone]

## 2012-09-07 NOTE — ED Notes (Signed)
Pt still in MRI 

## 2012-09-07 NOTE — H&P (Signed)
[facility] History and Physical  Thomas [name] ZOX:[id number] DOB: Dec 31, 1927 DOA: 09/07/2012  Referring physician: Dr. Bebe Shaggy PCP: [name], MD  Specialists: None  Chief Complaint: Altered mental status  HPI: Thomas [name] is a 79 y.o. male has a past medical history significant for type 2 diabetes, coronary artery disease status post bypass, COPD, history of lung and prostate cancer, chronic back pain, is brought in by the family with a chief complaint of altered mental status that gradually started last Friday. Patient is unable to tell me what brought him in to the ED, and he's not oriented to place or year, but otherwise very pleasant and interactive. I talked to the sister in all over the phone, and she endorses that at baseline, patient is walking on his own doing all necessary ADLs, alert and oriented x4, however in the past 4 days he has been more and more altered and confused, and feels like he has hallucinations. She hasn't noticed him complaining of any fever or chills, he has his chronic back pain, and otherwise it is without an apparent inciting factors. His UDS here is positive for benzodiazepines and opiates, and his daughter-in-law states that he usually takes one exam except that time and has been doing so for many years, and his wife is managing his pain medication, and he has taken 2 half pills yesterday, but not more than usual. Patient in the ED endorses mild bilateral lower quadrant abdominal pain and back pain, states it is chronic and is his usual pain. Workup in the ED is relatively unremarkable except for glucose of 290, mild LFT elevation, and the creatinine at 1.4 from baseline of 0.9-1.  Review of Systems: Unable to obtain full review of systems due to confusion  Past Medical History  Diagnosis Date  . Diabetes mellitus   . Anginal pain   . Hyperlipemia   . Cancer   . Lung cancer   . Prostate cancer    Past Surgical History  Procedure  Laterality Date  . Back surgery    . Tumor removal      from lung  . Prostate surgery    . Cardiac surgery    . Surgery to repair aneurysm     Social History:  reports that he quit smoking about 18 years ago. His smoking use included Cigarettes. He has a 50 pack-year smoking history. He uses smokeless tobacco. He reports that he does not drink alcohol or use illicit drugs.  No Known Allergies  History reviewed. No pertinent family history.  Prior to Admission medications   Medication Sig Start Date End Date Taking? Authorizing Provider  albuterol (PROVENTIL) 4 MG tablet Take 4 mg by mouth 3 (three) times daily.   Yes Historical Provider, MD  ALPRAZolam Prudy Feeler) 1 MG tablet Take 1 mg by mouth at bedtime as needed. For sleep   Yes Historical Provider, MD  diltiazem (CARDIZEM CD) 240 MG 24 hr capsule Take 1 capsule (240 mg total) by mouth every morning. 05/12/12  Yes Gaylord Shih, MD  insulin glargine (LANTUS) 100 UNIT/ML injection Inject 100 Units into the skin at bedtime.    Yes Historical Provider, MD  Omega-3 Fatty Acids (FISH OIL) 1200 MG CAPS Take 1 capsule by mouth 2 (two) times daily.    Yes Historical Provider, MD  pravastatin (PRAVACHOL) 80 MG tablet Take 1 tablet (80 mg total) by mouth every evening. 03/25/12  Yes Gaylord Shih, MD   Physical Exam: Filed Vitals:  09/07/12 1440 09/07/12 1444 09/07/12 1500 09/07/12 1551  BP: 125/48  111/47   Pulse:   72   Temp:  98.1 F (36.7 C)  98.1 F (36.7 C)  TempSrc:      Resp: 17     Height:      Weight:      SpO2:   100%      General:  No apparent distress, pleasantly disoriented male  Eyes: PERRL, EOMI, no scleral icterus  ENT: moist oropharynx  Neck: supple, no JVD  Cardiovascular: regular rate without MRG; 2+ peripheral pulses  Respiratory: CTA biL, good air movement without wheezing, rhonchi or crackled  Abdomen: soft, mildly tender to palpation bilateral lower quadrants, positive bowel sounds, no guarding, no  rebound  Skin: no rashes  Musculoskeletal: no peripheral edema; no tenderness over bony prominences on his spine  Psychiatric: normal mood and affect  Neurologic: CN 2-12 grossly intact, MS 5/5 in all 4, sensation intact  Labs on Admission:  Basic Metabolic Panel:  Recent Labs Lab 09/07/12 1323  NA 135  K 4.0  CL 100  CO2 22  GLUCOSE 290*  BUN 33*  CREATININE 1.44*  CALCIUM 9.4   Liver Function Tests:  Recent Labs Lab 09/07/12 1323  AST 48*  ALT 66*  ALKPHOS 62  BILITOT 0.9  PROT 6.7  ALBUMIN 3.5    Recent Labs Lab 09/07/12 1334  AMMONIA 23   CBC:  Recent Labs Lab 09/07/12 1323  WBC 8.7  NEUTROABS 6.9  HGB 14.6  HCT 43.3  MCV 94.5  PLT 136*   Cardiac Enzymes:  Recent Labs Lab 09/07/12 1323  TROPONINI <0.30   CBG:  Recent Labs Lab 09/07/12 1321  GLUCAP 247*    Radiological Exams on Admission: Dg Chest 1 View  09/07/2012   *RADIOLOGY REPORT*  Clinical Data: Altered mental status, fever  CHEST - 1 VIEW  Comparison: February 18, 2012  Findings: There is tenting of right hemidiaphragm unchanged.  There is no focal infiltrate, pulmonary edema or pleural effusion.  The patient is status post CABG.  The mediastinal contour and cardiac silhouette are stable.  Postsurgical changes of the right ribs and right lung are stable.  IMPRESSION: Chronic changes of right chest.  No acute cardiopulmonary disease identified.   Original Report Authenticated By: Sherian Rein, M.D.   Dg Lumbar Spine Complete  09/07/2012   *RADIOLOGY REPORT*  Clinical Data: Low back pain.  No known injury.  LUMBAR SPINE - COMPLETE 4+ VIEW  Comparison: 05/31/2010  Findings: Prior vertebroplasty L1.  Normal alignment.  No acute fracture.  Degenerative disc disease changes at L5 S1 with disc space narrowing, stable.  Mild degenerative facet disease in the lower lumbar spine.  Sclerosis around the left SI joint compatible with sacroiliitis.  IMPRESSION: Prior L1 vertebroplasty.   Degenerative changes.  No acute bony abnormality.   Original Report Authenticated By: Charlett Nose, M.D.   Ct Head Wo Contrast  09/07/2012   *RADIOLOGY REPORT*  Clinical Data: Altered mental status, history of lung and prostate cancer  CT HEAD WITHOUT CONTRAST  Technique:  Contiguous axial images were obtained from the base of the skull through the vertex without contrast.  Comparison: 11/27/2011  Findings: No skull fracture is noted.  Stable probable calcified meningioma in the right frontal region no surrounding mass effect or edema.  No intracranial hemorrhage, mass effect or midline shift. Paranasal sinuses and mastoid air cells are unremarkable.  No acute cortical infarction.  No mass lesion is  noted on this unenhanced scan.  Stable cerebral atrophy.  Stable periventricular and patchy subcortical white matter decreased attenuation consistent with chronic small vessel ischemic changes. No parenchymal brain lesion is identified on this unenhanced scan.  IMPRESSION: No acute intracranial abnormality.  Stable extra-axial probable calcified meningioma in the right frontal region.  Stable atrophy and chronic white matter disease.  No definite acute cortical infarction.   Original Report Authenticated By: Natasha Mead, M.D.    EKG: Independently reviewed.  Assessment/Plan Active Problems:   DIABETES MELLITUS, TYPE II   HYPERLIPIDEMIA-MIXED   CAD, ARTERY BYPASS GRAFT   Right bundle branch block and left anterior fascicular block   COPD   Acute encephalopathy  Acute encephalopathy - No apparent etiology, his chest x-ray is without acute findings, CT head is unremarkable, also had a L. spine x-ray without any acute findings. Urinalysis negative for urinary tract infection. He does have urine drug screen is positive for benzodiazepine and opiates, but per family he has never abused his medications and there are no suspicion that he took more pills at this point. - MRI brain, telemetry  Acute kidney  injury - Gentle hydration, will monitor BMP in the morning  Hyperglycemia - Will start home regimen of insulin, no anion gap  Coronary artery disease, hypertension, COPD - restart home medications  DVT Prophylaxis - heparin s.q.  Code Status: Full  Family Communication: sister in law, phone, [phone]  Disposition Plan: observation  Time spent: 60  Costin M. Elvera Lennox, MD [facility] Pager [phone]  If 7PM-7AM, please contact night-coverage [email or web address] Password [email or web address] 09/07/2012, 4:08 PM

## 2012-09-07 NOTE — ED Notes (Signed)
Pt has chronic back pain, worse last few days, unable to ambulate well. Pt from home.

## 2012-09-07 NOTE — ED Notes (Signed)
Pt feels warmer than oral temp, will perform rectal temp.

## 2012-09-07 NOTE — ED Notes (Signed)
Pt's sister in law to be called with info, pt's agrees, [phone] home, [phone] cell.

## 2012-09-07 NOTE — ED Notes (Signed)
Pt out to MRI

## 2012-09-07 NOTE — ED Notes (Signed)
FS by EMS [phone]

## 2012-09-08 DIAGNOSIS — R404 Transient alteration of awareness: Secondary | ICD-10-CM

## 2012-09-08 DIAGNOSIS — R7309 Other abnormal glucose: Secondary | ICD-10-CM

## 2012-09-08 LAB — HEPATIC FUNCTION PANEL
ALT: 50 U/L (ref 0–53)
AST: 34 U/L (ref 0–37)
Albumin: 3.1 g/dL — ABNORMAL LOW (ref 3.5–5.2)
Alkaline Phosphatase: 56 U/L (ref 39–117)
Total Bilirubin: 0.8 mg/dL (ref 0.3–1.2)

## 2012-09-08 LAB — TSH: TSH: 0.872 u[IU]/mL (ref 0.350–4.500)

## 2012-09-08 LAB — CBC
HCT: 40.8 % (ref 39.0–52.0)
Hemoglobin: 13.7 g/dL (ref 13.0–17.0)
RBC: 4.34 MIL/uL (ref 4.22–5.81)

## 2012-09-08 LAB — BASIC METABOLIC PANEL
BUN: 22 mg/dL (ref 6–23)
CO2: 23 mEq/L (ref 19–32)
Glucose, Bld: 176 mg/dL — ABNORMAL HIGH (ref 70–99)
Potassium: 3.6 mEq/L (ref 3.5–5.1)
Sodium: 137 mEq/L (ref 135–145)

## 2012-09-08 LAB — URINE CULTURE: Colony Count: NO GROWTH

## 2012-09-08 NOTE — Evaluation (Signed)
Physical Therapy Evaluation Patient Details Name: Thomas [name] MRN: [id number] DOB: Sep 01, 1927 Today's Date: 09/08/2012 Time: 4098-1191 PT Time Calculation (min): 41 min  PT Assessment / Plan / Recommendation History of Present Illness  Pt is admitted with weakness and altered mental status.  He had been independent in the community up until a week ago, per chart report.  Clinical Impression   Pt is seen for evaluation.  He is now much less confused than upon admission.  He is alert and very cooperative.  His strength is WNL but he is found to have decreased standing balance (static and dynamic).  This impacts his gait and he now needs a walker for ambulation.  Even with a walker, he tends to fall forward and needs mod assist of therapist to maintain stance.  This situation hopefully with resolve with time and therapy.   I am recommending SNF at d/c.    PT Assessment  Patient needs continued PT services    Follow Up Recommendations  SNF    Does the patient have the potential to tolerate intense rehabilitation    no  Barriers to Discharge  unknown      Equipment Recommendations  Rolling walker with 5" wheels    Recommendations for Other Services     Frequency Min 3X/week    Precautions / Restrictions Precautions Precautions: Fall Restrictions Weight Bearing Restrictions: No   Pertinent Vitals/Pain       Mobility  Bed Mobility Bed Mobility: Supine to Sit Supine to Sit: 4: Min assist;HOB flat Transfers Transfers: Sit to Stand;Stand to Sit Sit to Stand: 4: Min assist;With upper extremity assist Stand to Sit: 4: Min assist;With upper extremity assist Ambulation/Gait Ambulation/Gait Assistance: 3: Mod assist Ambulation Distance (Feet): 20 Feet Assistive device: Rolling walker Ambulation/Gait Assistance Details: pt has decreased dynamic standing balance and currently needs a walker for gait...he tends to fall forward as he walks and needs strong guarding of therapist  to maintain stability Gait Pattern: Shuffle;Trunk flexed Stairs: No Wheelchair Mobility Wheelchair Mobility: No    Exercises     PT Diagnosis: Difficulty walking;Abnormality of gait  PT Problem List: Decreased activity tolerance;Decreased balance;Decreased mobility;Decreased knowledge of use of DME;Decreased safety awareness;Decreased knowledge of precautions PT Treatment Interventions: Gait training;Functional mobility training;Balance training;Therapeutic exercise     PT Goals(Current goals can be found in the care plan section) Acute Rehab PT Goals Patient Stated Goal: none stated PT Goal Formulation: With patient Time For Goal Achievement: 09/22/12 Potential to Achieve Goals: Good  Visit Information  Last PT Received On: 09/08/12 History of Present Illness: Pt is admitted with weakness and altered mental status.  He had been independent in the community up until a week ago, per chart report.       Prior Functioning  Home Living Family/patient expects to be discharged to:: Skilled nursing facility Living Arrangements: Spouse/significant other Prior Function Level of Independence: Independent Communication Communication: No difficulties (wears dentures)    Cognition  Cognition Arousal/Alertness: Awake/alert Behavior During Therapy: WFL for tasks assessed/performed Overall Cognitive Status: Within Functional Limits for tasks assessed    Extremity/Trunk Assessment Upper Extremity Assessment Upper Extremity Assessment: Overall WFL for tasks assessed Lower Extremity Assessment Lower Extremity Assessment: Overall WFL for tasks assessed Cervical / Trunk Assessment Cervical / Trunk Assessment: Normal   Balance Balance Balance Assessed: Yes Static Sitting Balance Static Sitting - Balance Support: No upper extremity supported;Feet supported Static Sitting - Level of Assistance: 7: Independent Static Standing Balance Static Standing - Balance Support: No  upper extremity  supported Static Standing - Level of Assistance: 5: Stand by assistance Dynamic Standing Balance Dynamic Standing - Balance Support: No upper extremity supported Dynamic Standing - Level of Assistance: 3: Mod assist  End of Session PT - End of Session Equipment Utilized During Treatment: Gait belt Activity Tolerance: Patient tolerated treatment well Patient left: in chair;with call bell/phone within reach;with chair alarm set;with nursing/sitter in room Nurse Communication: Mobility status  GP Functional Assessment Tool Used: clinical judgement Functional Limitation: Mobility: Walking and moving around Mobility: Walking and Moving Around Current Status (Z6109): At least 20 percent but less than 40 percent impaired, limited or restricted Mobility: Walking and Moving Around Goal Status [phone]): At least 1 percent but less than 20 percent impaired, limited or restricted   Konrad Penta 09/08/2012, 3:30 PM

## 2012-09-08 NOTE — Care Management Note (Signed)
    Page 1 of 1   09/10/2012     2:59:31 PM   CARE MANAGEMENT NOTE 09/10/2012  Patient:  Thomas [name], Thomas [name]   Account Number:  [id number]  Date Initiated:  09/08/2012  Documentation initiated by:  Sharrie Rothman  Subjective/Objective Assessment:   Pt admitted from home with altered mental status. Pt lives with his wife who is unable to care for pt and pt unable to care for himself. Pt may need placement at discharge.     Action/Plan:   PT consult ordered. CSw aware of possible discharge need. Will continue to follow for discharge planning needs.   Anticipated DC Date:  09/09/2012   Anticipated DC Plan:  SKILLED NURSING FACILITY  In-house referral  Clinical Social Worker      DC Planning Services  CM consult      Choice offered to / List presented to:             Status of service:  Completed, signed off Medicare Important Message given?   (If response is "NO", the following Medicare IM given date fields will be blank) Date Medicare IM given:   Date Additional Medicare IM given:    Discharge Disposition:  SKILLED NURSING FACILITY  Per UR Regulation:    If discussed at Long Length of Stay Meetings, dates discussed:    Comments:  09/10/12 1500 Arlyss Queen, RN BSN CM Pt discharged to [facility] today. CSW to arrange discharge to facility.  09/09/12 1610 Arlyss Queen, RN BSN CM Pts wife has accepted a bed at [facility]. Pt is refusing to go but pt is still confused. Pt is now needing telepsych for capacity.  09/08/12 1415 Arlyss Queen, RN BSN CM

## 2012-09-08 NOTE — Clinical Social Work Placement (Signed)
    Clinical Social Work Department CLINICAL SOCIAL WORK PLACEMENT NOTE 09/08/2012  Patient:  ELIJHA, DEDMAN  Account Number:  [id number] Admit date:  09/07/2012  Clinical Social Worker:  Santa Genera, CLINICAL SOCIAL WORKER  Date/time:  09/08/2012 03:00 PM  Clinical Social Work is seeking post-discharge placement for this patient at the following level of care:   SKILLED NURSING   (*CSW will update this form in Epic as items are completed)   09/08/2012  Patient/family provided with [facility] Department of Clinical Social Work's list of facilities offering this level of care within the geographic area requested by the patient (or if unable, by the patient's family).  09/08/2012  Patient/family informed of their freedom to choose among providers that offer the needed level of care, that participate in Medicare, Medicaid or managed care program needed by the patient, have an available bed and are willing to accept the patient.  09/08/2012  Patient/family informed of [facility]' ownership interest in [facility], as well as of the fact that they are under no obligation to receive care at this facility.  PASARR submitted to EDS on 09/08/2012 PASARR number received from EDS on 09/08/2012  FL2 transmitted to all facilities in geographic area requested by pt/family on  09/08/2012 FL2 transmitted to all facilities within larger geographic area on   Patient informed that his/her managed care company has contracts with or will negotiate with  certain facilities, including the following:     Patient/family informed of bed offers received:   Patient chooses bed at  Physician recommends and patient chooses bed at    Patient to be transferred to  on   Patient to be transferred to facility by   The following physician request were entered in Epic:   Additional Comments:  Santa Genera, LCSW Clinical Social Worker [phone])

## 2012-09-08 NOTE — Clinical Social Work Psychosocial (Signed)
    Clinical Social Work Department BRIEF PSYCHOSOCIAL ASSESSMENT 09/08/2012  Patient:  Thomas [name], Thomas [name]     Account Number:  [id number]     Admit date:  09/07/2012  Clinical Social Worker:  Santa Genera, CLINICAL SOCIAL WORKER  Date/Time:  09/08/2012 02:30 PM  Referred by:  Care Management  Date Referred:  09/08/2012 Referred for  SNF Placement   Other Referral:   Interview type:  Patient Other interview type:    PSYCHOSOCIAL DATA Living Status:  WIFE Admitted from facility:   Level of care:   Primary support name:  Thomas [name] Primary support relationship to patient:  SPOUSE Degree of support available:   Wife increasingly disabled, may not be able to care for patient as he needs at this point.    CURRENT CONCERNS Current Concerns  Post-Acute Placement   Other Concerns:    SOCIAL WORK ASSESSMENT / PLAN CSW met w patient at bedside, patient oriented to self, says he is in "[place] [facility]" and that it's "in in the 2000s, spring."  Appears to have some difficulty speaking - patient says that he normally wears dentures. Patient says that he lives at home w his wife, says she cares for him by doing "whatever a wife Clent Ridges does." Patient seems to understand that his physical abilities have declined in recent past.  Says he used to drive and was able to walk.    Patient agreed to CSW talking to wife, Thomas [name].  Wife says she struggles to care for patient at home - says he has had great difficulty w managing his insulin - this happened "all of a sudden" - says patient is "totally absorbed w his insulin", has difficulty monitoring blood sugars and giving himself injections.  Wife says he still drives but I'Im scared for him to drive."  Wife wants him to "have the care he needs."  Is interested in SNF placement, but "he would get out if he could" and has said that he will be very angry if wife puts in him a nursing home.  Doubts patient will accept SNF rehab placement, but says  she is unable to care for him.  Wife says she is disabled w "breathing difficulties", does not have home health services in place. Says she cannot drive so would not be able to get to DSS without help.    Patient and wife have not applied for Medicaid, does not know if they will qualify.    CSW will await PT recommendation on level of care needed.   Assessment/plan status:  Psychosocial Support/Ongoing Assessment of Needs Other assessment/ plan:   Information/referral to community resources:   Will give SNF or ALF  list if needed    PATIENT'S/FAMILY'S RESPONSE TO PLAN OF CARE: Patient and wife willing to work w CSW and appreciative of help w discharge planning.   Santa Genera, LCSW Clinical Social Worker [phone])

## 2012-09-08 NOTE — Progress Notes (Signed)
UR chart review completed.  

## 2012-09-08 NOTE — Progress Notes (Signed)
[facility] PROGRESS NOTE  DEMETRES PROCHNOW ZOX:[id number] DOB: Aug 11, 1927 DOA: 09/07/2012 PCP: [name], MD  HPI: BABY STAIRS is a 78 y.o. male has a past medical history significant for type 2 diabetes, coronary artery disease status post bypass, COPD, history of lung and prostate cancer, chronic back pain, is brought in by the family with a chief complaint of altered mental status that gradually started last Friday. Patient is unable to tell me what brought him in to the ED, and he's not oriented to place or year, but otherwise very pleasant and interactive. I talked to the sister in all over the phone, and she endorses that at baseline, patient is walking on his own doing all necessary ADLs, alert and oriented x4, however in the past 4 days he has been more and more altered and confused, and feels like he has hallucinations. She hasn't noticed him complaining of any fever or chills, he has his chronic back pain, and otherwise it is without an apparent inciting factors. His UDS here is positive for benzodiazepines and opiates, and his daughter-in-law states that he usually takes one exam except that time and has been doing so for many years, and his wife is managing his pain medication, and he has taken 2 half pills yesterday, but not more than usual. Patient in the ED endorses mild bilateral lower quadrant abdominal pain and back pain, states it is chronic and is his usual pain. Workup in the ED is relatively unremarkable except for glucose of 290, mild LFT elevation, and the creatinine at 1.4 from baseline of 0.9-1.  Assessment/Plan: Acute encephalopathy  - No apparent etiology, his chest x-ray is without acute findings, CT head is unremarkable, also had a L. spine x-ray without any acute findings. Urinalysis negative for urinary tract infection. He does have urine drug screen is positive for benzodiazepine and opiates, but per family he has never abused his medications and there  are no suspicion that he took more pills at this point.  - MRI brain without acute findings. - consult Neurology today.  Acute kidney injury  - Gentle hydration, Cr improved today.  Hyperglycemia  - Will start home regimen of insulin, no anion gap  Coronary artery disease, hypertension, COPD - restart home medications  DVT Prophylaxis - heparin s.q.  Code Status: Full Family Communication: sister in law bedside  Disposition Plan: home vs SNF when medically ready  Consultants:  Neurology  Procedures:  none  Anti-infectives   None     Antibiotics Given (last 72 hours)   None      HPI/Subjective: - no complaints, confused to place and time  Objective: Filed Vitals:   09/07/12 2220 09/08/12 0004 09/08/12 0251 09/08/12 0608  BP: 127/64 113/69 105/53 123/69  Pulse:  79 75   Temp: 98.4 F (36.9 C) 97.6 F (36.4 C) 98.4 F (36.9 C) 97.8 F (36.6 C)  TempSrc: Oral Oral Oral Oral  Resp: 20 20 20 20   Height:      Weight:      SpO2: 98% 98% 98% 98%    Intake/Output Summary (Last 24 hours) at 09/08/12 1111 Last data filed at 09/08/12 1014  Gross per 24 hour  Intake    360 ml  Output   1400 ml  Net  -1040 ml   Filed Weights   09/07/12 1257 09/07/12 1939  Weight: 86.183 kg (190 lb) 89.903 kg (198 lb 3.2 oz)    Exam:   General:  NAD  Cardiovascular:  regular rate and rhythm, without MRG  Respiratory: good air movement, clear to auscultation throughout, no wheezing, ronchi or rales  Abdomen: soft, not tender to palpation, positive bowel sounds  MSK: no peripheral edema  Neuro: non focal  Data Reviewed: Basic Metabolic Panel:  Recent Labs Lab 09/07/12 1323 09/08/12 0446  NA 135 137  K 4.0 3.6  CL 100 103  CO2 22 23  GLUCOSE 290* 176*  BUN 33* 22  CREATININE 1.44* 1.12  CALCIUM 9.4 9.1  MG  --  1.8   Liver Function Tests:  Recent Labs Lab 09/07/12 1323 09/08/12 0446  AST 48* 34  ALT 66* 50  ALKPHOS 62 56  BILITOT 0.9 0.8  PROT 6.7  6.0  ALBUMIN 3.5 3.1*    Recent Labs Lab 09/07/12 1334  AMMONIA 23   CBC:  Recent Labs Lab 09/07/12 1323 09/08/12 0446  WBC 8.7 5.9  NEUTROABS 6.9  --   HGB 14.6 13.7  HCT 43.3 40.8  MCV 94.5 94.0  PLT 136* 135*   Cardiac Enzymes:  Recent Labs Lab 09/07/12 1323  TROPONINI <0.30   CBG:  Recent Labs Lab 09/07/12 1321 09/07/12 2116  GLUCAP 247* 238*    Recent Results (from the past 240 hour(s))  CULTURE, BLOOD (ROUTINE X 2)     Status: None   Collection Time    09/07/12  1:27 PM      Result Value Range Status   Specimen Description BLOOD LEFT ANTECUBITAL   Final   Special Requests BOTTLES DRAWN AEROBIC AND ANAEROBIC 10CC   Final   Culture NO GROWTH 1 DAY   Final   Report Status PENDING   Incomplete  CULTURE, BLOOD (ROUTINE X 2)     Status: None   Collection Time    09/07/12  1:34 PM      Result Value Range Status   Specimen Description BLOOD RIGHT ANTECUBITAL   Final   Special Requests     Final   Value: BOTTLES DRAWN AEROBIC AND ANAEROBIC AEB=12CC ANA=15CC   Culture NO GROWTH 1 DAY   Final   Report Status PENDING   Incomplete     Studies: Dg Chest 1 View  09/07/2012   *RADIOLOGY REPORT*  Clinical Data: Altered mental status, fever  CHEST - 1 VIEW  Comparison: February 18, 2012  Findings: There is tenting of right hemidiaphragm unchanged.  There is no focal infiltrate, pulmonary edema or pleural effusion.  The patient is status post CABG.  The mediastinal contour and cardiac silhouette are stable.  Postsurgical changes of the right ribs and right lung are stable.  IMPRESSION: Chronic changes of right chest.  No acute cardiopulmonary disease identified.   Original Report Authenticated By: Sherian Rein, M.D.   Dg Lumbar Spine Complete  09/07/2012   *RADIOLOGY REPORT*  Clinical Data: Low back pain.  No known injury.  LUMBAR SPINE - COMPLETE 4+ VIEW  Comparison: 05/31/2010  Findings: Prior vertebroplasty L1.  Normal alignment.  No acute fracture.  Degenerative  disc disease changes at L5 S1 with disc space narrowing, stable.  Mild degenerative facet disease in the lower lumbar spine.  Sclerosis around the left SI joint compatible with sacroiliitis.  IMPRESSION: Prior L1 vertebroplasty.  Degenerative changes.  No acute bony abnormality.   Original Report Authenticated By: Charlett Nose, M.D.   Ct Head Wo Contrast  09/07/2012   *RADIOLOGY REPORT*  Clinical Data: Altered mental status, history of lung and prostate cancer  CT HEAD WITHOUT CONTRAST  Technique:  Contiguous axial images were obtained from the base of the skull through the vertex without contrast.  Comparison: 11/27/2011  Findings: No skull fracture is noted.  Stable probable calcified meningioma in the right frontal region no surrounding mass effect or edema.  No intracranial hemorrhage, mass effect or midline shift. Paranasal sinuses and mastoid air cells are unremarkable.  No acute cortical infarction.  No mass lesion is noted on this unenhanced scan.  Stable cerebral atrophy.  Stable periventricular and patchy subcortical white matter decreased attenuation consistent with chronic small vessel ischemic changes. No parenchymal brain lesion is identified on this unenhanced scan.  IMPRESSION: No acute intracranial abnormality.  Stable extra-axial probable calcified meningioma in the right frontal region.  Stable atrophy and chronic white matter disease.  No definite acute cortical infarction.   Original Report Authenticated By: Natasha Mead, M.D.   Mr Laqueta Jean Wo Contrast  09/07/2012   *RADIOLOGY REPORT*  Clinical Data: Altered mental status.  Weakness.  History of prostate cancer.  MRI HEAD WITHOUT AND WITH CONTRAST  Technique:  Multiplanar, multiecho pulse sequences of the brain and surrounding structures were obtained according to standard protocol without and with intravenous contrast  Contrast: 9mL MULTIHANCE GADOBENATE DIMEGLUMINE 529 MG/ML IV SOLN  Comparison: 09/07/2012 CT.  09/29/2007 MR.  Findings: Motion  degraded exam.  No acute infarct.  No intracranial hemorrhage.  Anterior superior right frontal 2.8 x 2.2 x 0.7 cm en plaque meningioma stable since 2009.  No other intracranial mass or abnormal enhancement.  Moderate small vessel disease type changes.  Global atrophy without hydrocephalus.  Major intracranial vascular structures are patent.  Minimal mucosal thickening ethmoid sinus air cells.  Mild transverse ligament hypertrophy.  Cervical medullary junction, pituitary region, pineal region and orbital structures unremarkable.  IMPRESSION: Motion degraded exam.  No acute infarct.  No intracranial hemorrhage.  Anterior superior right frontal 2.8 x 2.2 x 0.7 cm en plaque meningioma stable since 2009.  Moderate small vessel disease type changes.   Original Report Authenticated By: Lacy Duverney, M.D.    Scheduled Meds: . albuterol  4 mg Oral TID  . atorvastatin  20 mg Oral q1800  . diltiazem  240 mg Oral q morning - 10a  . heparin  5,000 Units Subcutaneous Q8H  . insulin glargine  50 Units Subcutaneous QHS  . omega-3 acid ethyl esters  1 g Oral BID  . sodium chloride  3 mL Intravenous Q12H   Continuous Infusions: . sodium chloride 75 mL/hr at 09/07/12 2151    Active Problems:   DIABETES MELLITUS, TYPE II   HYPERLIPIDEMIA-MIXED   CAD, ARTERY BYPASS GRAFT   Right bundle branch block and left anterior fascicular block   COPD   Acute encephalopathy  Time spent: 35  Pamella Pert, MD [facility] Pager [phone]. If 7 PM - 7 AM, please contact night-coverage at [email or web address], password [facility] 09/08/2012, 11:11 AM  LOS: 1 day

## 2012-09-08 NOTE — Progress Notes (Signed)
Inpatient Diabetes Program Recommendations  AACE/ADA: New Consensus Statement on Inpatient Glycemic Control (2013)  Target Ranges:  Prepandial:   less than 140 mg/dL      Peak postprandial:   less than 180 mg/dL (1-2 hours)      Critically ill patients:  140 - 180 mg/dL  Results for JAQUAIL, MCLEES (MRN [id number]) as of 09/08/2012 08:21  Ref. Range 07/27/2010 04:00  Hemoglobin A1C Latest Range: <5.7 % 6.4 (H)   Results for CHANTZ, MONTEFUSCO (MRN [id number]) as of 09/08/2012 08:21  Ref. Range 09/07/2012 13:21 09/07/2012 21:16  Glucose-Capillary Latest Range: 70-99 mg/dL 295 (H) 284 (H)    Inpatient Diabetes Program Recommendations Correction (SSI): Please order CBGs with Novolog correction ACHS. HgbA1C: Please order an A1C to determine glycemic control over the past 2-3 months.  Note: Patient has a history of diabetes and takes Lantus 100 units QHS at home for diabetes management.  Currently, patient is ordered to receive Lantus 50 units QHS for inpatient glycemic control.  Initial blood glucose noted to be 290 mg/dl on 02/06/22 and last M0N in the chart was 6.4% on 07/27/2010.  Please order CBGs with Novolog correction [facility] while inpatient and order an A1C.  Will continue to follow.  Thanks, Orlando Penner, RN, MSN, CCRN Diabetes Coordinator Inpatient Diabetes Program [phone]

## 2012-09-09 DIAGNOSIS — N179 Acute kidney failure, unspecified: Secondary | ICD-10-CM

## 2012-09-09 LAB — CBC
HCT: 39.9 % (ref 39.0–52.0)
Platelets: 121 10*3/uL — ABNORMAL LOW (ref 150–400)
RDW: 14.5 % (ref 11.5–15.5)
WBC: 5.2 10*3/uL (ref 4.0–10.5)

## 2012-09-09 LAB — HOMOCYSTEINE: Homocysteine: 90.5 umol/L — ABNORMAL HIGH (ref 4.0–15.4)

## 2012-09-09 LAB — VITAMIN B12: Vitamin B-12: 180 pg/mL — ABNORMAL LOW (ref 211–911)

## 2012-09-09 LAB — COMPREHENSIVE METABOLIC PANEL
AST: 38 U/L — ABNORMAL HIGH (ref 0–37)
Albumin: 3 g/dL — ABNORMAL LOW (ref 3.5–5.2)
Alkaline Phosphatase: 54 U/L (ref 39–117)
Chloride: 106 mEq/L (ref 96–112)
Potassium: 3.5 mEq/L (ref 3.5–5.1)
Total Bilirubin: 0.6 mg/dL (ref 0.3–1.2)

## 2012-09-09 LAB — GLUCOSE, CAPILLARY

## 2012-09-09 MED ORDER — INSULIN ASPART 100 UNIT/ML ~~LOC~~ SOLN: 0.0000 [IU] | Freq: Every day | SUBCUTANEOUS | Status: DC

## 2012-09-09 MED ORDER — CYANOCOBALAMIN 1000 MCG/ML IJ SOLN
1000.0000 ug | Freq: Once | INTRAMUSCULAR | Status: AC
  Administered 2012-09-09: 1000 ug via INTRAMUSCULAR
  Filled 2012-09-09: qty 1

## 2012-09-09 MED ORDER — INSULIN ASPART 100 UNIT/ML ~~LOC~~ SOLN
0.0000 [IU] | Freq: Three times a day (TID) | SUBCUTANEOUS | Status: DC
  Administered 2012-09-10: 2 [IU] via SUBCUTANEOUS

## 2012-09-09 MED ORDER — INSULIN GLARGINE 100 UNIT/ML ~~LOC~~ SOLN
55.0000 [IU] | Freq: Every day | SUBCUTANEOUS | Status: DC
  Administered 2012-09-09: 55 [IU] via SUBCUTANEOUS
  Filled 2012-09-09 (×2): qty 0.55

## 2012-09-09 NOTE — Clinical Social Work Note (Signed)
Patient insists he has driven his car to [facility] this AM, needs to return home to get his wife to the hospital as she is sick.  Brother in room states that patient was taken by EMS to [facility], patient's car is at his home in [place].  Patient states he can walk and drive at present.  Brother previously agreed to sign preadmission paperwork at [facility] but decided not to as paperwork indicated he would assist patient w Medicaid application.  Brother ill and does not feel he can take on this responsibility.  Brother says he will bring patient's wife to [facility] tomorrow AM to sign paperwork, wife agreeable.  MD notified of patient's insistence on returning home today to care for wife and patient's lack of awareness of his current circumstances.  Ordered telepsych to determine capacity of patient to make decisions for himself.  Patient appears to lack family support sufficient to help him address his current care needs.  Sister in law June has spoken w wife as well and assisted in choosing [facility] for SNF rehab.  Santa Genera, LCSW Clinical Social Worker [phone])

## 2012-09-09 NOTE — Progress Notes (Signed)
UR chart review completed.  

## 2012-09-09 NOTE — Progress Notes (Signed)
Physical Therapy Treatment Patient Details Name: Thomas [name] MRN: [id number] DOB: 1927/09/23 Today's Date: 09/09/2012 Time: 4098-1191 PT Time Calculation (min): 20 min  PT Assessment / Plan / Recommendation  PT Comments   Pt appears to be doing very well. Pt completes therex independently after initial cueing. Pt ambulates increased distance with gait training. Gait is steady with rolling walker. Pt requires VCs for posture and walker placement. Pt tolerates tx well.   Plan Current plan remains appropriate       Pertinent Vitals/Pain No pain.    Mobility  Bed Mobility Bed Mobility: Supine to Sit Supine to Sit: 6: Modified independent (Device/Increase time);HOB elevated Transfers Transfers: Sit to Stand;Stand to Sit Sit to Stand: 4: Min guard;Without upper extremity assist Stand to Sit: 4: Min guard;Without upper extremity assist Ambulation/Gait Ambulation/Gait Assistance: 5: Supervision Ambulation Distance (Feet): 280 Feet Assistive device: Rolling walker Ambulation/Gait Assistance Details: Pt requires vc's for posture and stride length Gait Pattern: Shuffle;Trunk flexed Gait velocity: Normal    Exercises General Exercises - Lower Extremity Ankle Circles/Pumps: AROM;Both;10 reps;Supine Hip ABduction/ADduction: AAROM;AROM;10 reps;Supine Straight Leg Raises: AROM;Both;10 reps;Supine    Visit Information  Last PT Received On: 09/09/12    Subjective Data  I'm feeling much better. I'm ready to go home.        End of Session PT - End of Session Equipment Utilized During Treatment: Gait belt Activity Tolerance: Patient tolerated treatment well Patient left: with call bell/phone within reach;with nursing/sitter in room;in bed Nurse Communication: Mobility status   Seth Bake, PTA  09/09/2012, 5:07 PM

## 2012-09-09 NOTE — Consult Note (Signed)
[facility] [place] Mikami A. Gerilyn Pilgrim, MD     [email or web address]          Thomas [name] is an 79 y.o. male.   ASSESSMENT/PLAN: 1. Likely toxic metabolic encephalopathy. It may be some baseline cognitive impairment.  However, the patient seems to have evidence of mild parkinsonism although this should not be associated with marked cognitive impairment. Patient also has some appears to be a small meningioma on image and which sometimes can be associated with seizures. Dementia labs will be obtained. Additionally, EEG will be obtained.  The patient is an 79 year old white male who has some impairment at baseline. He is able to do his ADLs and functions decently. The patient presents with about a 3 day history of deteriorating cognition and confusion. The patient continues to remain confused. He has had some metabolic derangements seems most likely aspiration for her symptoms. Despite improvement in his metabolic profile, he continues to be confused. Patient is unable to provide a history. He does not report any complaints at this time but the history given is suspect.  GENERAL: He is in no acute distress. Does have a sitter because of confusion and pulling stuff out and try to get out of the bed.  HEENT: Neck supple. Head is normocephalic atraumatic. There does appears to be a mask face appearance.  ABDOMEN: soft  EXTREMITIES: No edema   BACK: Unremarkable.  SKIN: Normal by inspection.    MENTAL STATUS: Patient is awake and alert. He does not cooperate with the evaluation but is confused and disoriented. Speech appears to be mildly hypophonic.  CRANIAL NERVES: Pupils are equal, round and reactive to light and accommodation; extra ocular movements are full, there is no significant nystagmus; visual fields are full; upper and lower facial muscles are normal in strength and symmetric, there is no flattening of the nasolabial folds; tongue is midline.  MOTOR: Normal tone, bulk and  strength; no pronator drift.  COORDINATION: Left finger to nose is normal, right finger to nose is normal. There is occasional rest and action tremor. No postural tremor. There is moderate global bradykinesia. There is occasional flapping noted of the hands.  REFLEXES: Deep tendon reflexes are symmetrical and normal. Plantar responses are flexor bilaterally.   SENSATION: Symmetric to be painful stimuli.   Past Medical History  Diagnosis Date  . Diabetes mellitus   . Anginal pain   . Hyperlipemia   . Cancer   . Lung cancer   . Prostate cancer     Past Surgical History  Procedure Laterality Date  . Back surgery    . Tumor removal      from lung  . Prostate surgery    . Cardiac surgery    . Surgery to repair aneurysm      History reviewed. No pertinent family history.  Social History:  reports that he quit smoking about 18 years ago. His smoking use included Cigarettes. He has a 50 pack-year smoking history. He uses smokeless tobacco. He reports that he does not drink alcohol or use illicit drugs.  Allergies: No Known Allergies  Medications:  Prior to Admission medications   Medication Sig Start Date End Date Taking? Authorizing Provider  albuterol (PROVENTIL) 4 MG tablet Take 4 mg by mouth 3 (three) times daily.   Yes Historical Provider, MD  ALPRAZolam Prudy Feeler) 1 MG tablet Take 1 mg by mouth at bedtime as needed. For sleep   Yes Historical Provider, MD  diltiazem (CARDIZEM CD) 240 MG 24 hr capsule  Take 1 capsule (240 mg total) by mouth every morning. 05/12/12  Yes Gaylord Shih, MD  insulin glargine (LANTUS) 100 UNIT/ML injection Inject 100 Units into the skin at bedtime.    Yes Historical Provider, MD  Omega-3 Fatty Acids (FISH OIL) 1200 MG CAPS Take 1 capsule by mouth 2 (two) times daily.    Yes Historical Provider, MD  pravastatin (PRAVACHOL) 80 MG tablet Take 1 tablet (80 mg total) by mouth every evening. 03/25/12  Yes Gaylord Shih, MD    Scheduled Meds: . albuterol  4  mg Oral TID  . atorvastatin  20 mg Oral q1800  . diltiazem  240 mg Oral q morning - 10a  . heparin  5,000 Units Subcutaneous Q8H  . insulin glargine  50 Units Subcutaneous QHS  . omega-3 acid ethyl esters  1 g Oral BID  . sodium chloride  3 mL Intravenous Q12H   Continuous Infusions:  PRN Meds:.ALPRAZolam   Blood pressure 134/70, pulse 70, temperature 98.1 F (36.7 C), temperature source Oral, resp. rate 18, height 5\' 11"  (1.803 m), weight 89.903 kg (198 lb 3.2 oz), SpO2 95.00%.   Results for orders placed during the hospital encounter of 09/07/12 (from the past 48 hour(s))  GLUCOSE, CAPILLARY     Status: Abnormal   Collection Time    09/07/12  1:21 PM      Result Value Range   Glucose-Capillary 247 (*) 70 - 99 mg/dL   Comment 1 Documented in Chart     Comment 2 Notify RN    CBC WITH DIFFERENTIAL     Status: Abnormal   Collection Time    09/07/12  1:23 PM      Result Value Range   WBC 8.7  4.0 - 10.5 K/uL   RBC 4.58  4.22 - 5.81 MIL/uL   Hemoglobin 14.6  13.0 - 17.0 g/dL   HCT 16.1  09.6 - 04.5 %   MCV 94.5  78.0 - 100.0 fL   MCH 31.9  26.0 - 34.0 pg   MCHC 33.7  30.0 - 36.0 g/dL   RDW 40.9  81.1 - 91.4 %   Platelets 136 (*) 150 - 400 K/uL   Neutrophils Relative % 80 (*) 43 - 77 %   Neutro Abs 6.9  1.7 - 7.7 K/uL   Lymphocytes Relative 11 (*) 12 - 46 %   Lymphs Abs 0.9  0.7 - 4.0 K/uL   Monocytes Relative 9  3 - 12 %   Monocytes Absolute 0.8  0.1 - 1.0 K/uL   Eosinophils Relative 0  0 - 5 %   Eosinophils Absolute 0.0  0.0 - 0.7 K/uL   Basophils Relative 0  0 - 1 %   Basophils Absolute 0.0  0.0 - 0.1 K/uL  COMPREHENSIVE METABOLIC PANEL     Status: Abnormal   Collection Time    09/07/12  1:23 PM      Result Value Range   Sodium 135  135 - 145 mEq/L   Potassium 4.0  3.5 - 5.1 mEq/L   Chloride 100  96 - 112 mEq/L   CO2 22  19 - 32 mEq/L   Glucose, Bld 290 (*) 70 - 99 mg/dL   BUN 33 (*) 6 - 23 mg/dL   Creatinine, Ser 7.82 (*) 0.50 - 1.35 mg/dL   Calcium 9.4  8.4 -  95.6 mg/dL   Total Protein 6.7  6.0 - 8.3 g/dL   Albumin 3.5  3.5 - 5.2 g/dL  AST 48 (*) 0 - 37 U/L   ALT 66 (*) 0 - 53 U/L   Alkaline Phosphatase 62  39 - 117 U/L   Total Bilirubin 0.9  0.3 - 1.2 mg/dL   GFR calc non Af Amer 44 (*) >90 mL/min   GFR calc Af Amer 51 (*) >90 mL/min   Comment:            The eGFR has been calculated     using the CKD EPI equation.     This calculation has not been     validated in all clinical     situations.     eGFR's persistently     <90 mL/min signify     possible Chronic Kidney Disease.  TROPONIN I     Status: None   Collection Time    09/07/12  1:23 PM      Result Value Range   Troponin I <0.30  <0.30 ng/mL   Comment:            Due to the release kinetics of cTnI,     a negative result within the first hours     of the onset of symptoms does not rule out     myocardial infarction with certainty.     If myocardial infarction is still suspected,     repeat the test at appropriate intervals.  PROTIME-INR     Status: Abnormal   Collection Time    09/07/12  1:23 PM      Result Value Range   Prothrombin Time 16.4 (*) 11.6 - 15.2 seconds   INR 1.36  0.00 - 1.49  LACTIC ACID, PLASMA     Status: None   Collection Time    09/07/12  1:23 PM      Result Value Range   Lactic Acid, Venous 2.2  0.5 - 2.2 mmol/L  ETHANOL     Status: None   Collection Time    09/07/12  1:23 PM      Result Value Range   Alcohol, Ethyl (B) <11  0 - 11 mg/dL   Comment:            LOWEST DETECTABLE LIMIT FOR     SERUM ALCOHOL IS 11 mg/dL     FOR MEDICAL PURPOSES ONLY  URINALYSIS, ROUTINE W REFLEX MICROSCOPIC     Status: Abnormal   Collection Time    09/07/12  1:25 PM      Result Value Range   Color, Urine YELLOW  YELLOW   APPearance CLEAR  CLEAR   Specific Gravity, Urine >1.030 (*) 1.005 - 1.030   pH 5.5  5.0 - 8.0   Glucose, UA 500 (*) NEGATIVE mg/dL   Hgb urine dipstick NEGATIVE  NEGATIVE   Bilirubin Urine NEGATIVE  NEGATIVE   Ketones, ur 15 (*)  NEGATIVE mg/dL   Protein, ur NEGATIVE  NEGATIVE mg/dL   Urobilinogen, UA 0.2  0.0 - 1.0 mg/dL   Nitrite NEGATIVE  NEGATIVE   Leukocytes, UA NEGATIVE  NEGATIVE   Comment: MICROSCOPIC NOT DONE ON URINES WITH NEGATIVE PROTEIN, BLOOD, LEUKOCYTES, NITRITE, OR GLUCOSE <1000 mg/dL.  URINE CULTURE     Status: None   Collection Time    09/07/12  1:25 PM      Result Value Range   Specimen Description URINE, CATHETERIZED     Special Requests NONE     Culture  Setup Time       Value: 09/08/2012 00:28  Performed at [facility] Count       Value: NO GROWTH     Performed at [facility]   Culture       Value: NO GROWTH     Performed at [facility]   Report Status 09/08/2012 FINAL    URINE RAPID DRUG SCREEN (HOSP PERFORMED)     Status: Abnormal   Collection Time    09/07/12  1:25 PM      Result Value Range   Opiates POSITIVE (*) NONE DETECTED   Cocaine NONE DETECTED  NONE DETECTED   Benzodiazepines POSITIVE (*) NONE DETECTED   Amphetamines NONE DETECTED  NONE DETECTED   Tetrahydrocannabinol NONE DETECTED  NONE DETECTED   Barbiturates NONE DETECTED  NONE DETECTED   Comment:            DRUG SCREEN FOR MEDICAL PURPOSES     ONLY.  IF CONFIRMATION IS NEEDED     FOR ANY PURPOSE, NOTIFY LAB     WITHIN 5 DAYS.                LOWEST DETECTABLE LIMITS     FOR URINE DRUG SCREEN     Drug Class       Cutoff (ng/mL)     Amphetamine      1000     Barbiturate      200     Benzodiazepine   200     Tricyclics       300     Opiates          300     Cocaine          300     THC              50  CULTURE, BLOOD (ROUTINE X 2)     Status: None   Collection Time    09/07/12  1:27 PM      Result Value Range   Specimen Description BLOOD LEFT ANTECUBITAL     Special Requests BOTTLES DRAWN AEROBIC AND ANAEROBIC 10CC     Culture NO GROWTH 1 DAY     Report Status PENDING    CULTURE, BLOOD (ROUTINE X 2)     Status: None   Collection Time    09/07/12  1:34 PM       Result Value Range   Specimen Description BLOOD RIGHT ANTECUBITAL     Special Requests       Value: BOTTLES DRAWN AEROBIC AND ANAEROBIC AEB=12CC ANA=15CC   Culture NO GROWTH 1 DAY     Report Status PENDING    AMMONIA     Status: None   Collection Time    09/07/12  1:34 PM      Result Value Range   Ammonia 23  11 - 60 umol/L  TSH     Status: None   Collection Time    09/07/12  4:12 PM      Result Value Range   TSH 0.872  0.350 - 4.500 uIU/mL   Comment: Performed at [facility]  GLUCOSE, CAPILLARY     Status: Abnormal   Collection Time    09/07/12  9:16 PM      Result Value Range   Glucose-Capillary 238 (*) 70 - 99 mg/dL   Comment 1 Notify RN    CBC     Status: Abnormal   Collection Time    09/08/12  4:46 AM  Result Value Range   WBC 5.9  4.0 - 10.5 K/uL   RBC 4.34  4.22 - 5.81 MIL/uL   Hemoglobin 13.7  13.0 - 17.0 g/dL   HCT 16.1  09.6 - 04.5 %   MCV 94.0  78.0 - 100.0 fL   MCH 31.6  26.0 - 34.0 pg   MCHC 33.6  30.0 - 36.0 g/dL   RDW 40.9  81.1 - 91.4 %   Platelets 135 (*) 150 - 400 K/uL  BASIC METABOLIC PANEL     Status: Abnormal   Collection Time    09/08/12  4:46 AM      Result Value Range   Sodium 137  135 - 145 mEq/L   Potassium 3.6  3.5 - 5.1 mEq/L   Chloride 103  96 - 112 mEq/L   CO2 23  19 - 32 mEq/L   Glucose, Bld 176 (*) 70 - 99 mg/dL   BUN 22  6 - 23 mg/dL   Comment: DELTA CHECK NOTED   Creatinine, Ser 1.12  0.50 - 1.35 mg/dL   Calcium 9.1  8.4 - 78.2 mg/dL   GFR calc non Af Amer 59 (*) >90 mL/min   GFR calc Af Amer 69 (*) >90 mL/min   Comment:            The eGFR has been calculated     using the CKD EPI equation.     This calculation has not been     validated in all clinical     situations.     eGFR's persistently     <90 mL/min signify     possible Chronic Kidney Disease.  HEPATIC FUNCTION PANEL     Status: Abnormal   Collection Time    09/08/12  4:46 AM      Result Value Range   Total Protein 6.0  6.0 - 8.3 g/dL   Albumin  3.1 (*) 3.5 - 5.2 g/dL   AST 34  0 - 37 U/L   ALT 50  0 - 53 U/L   Alkaline Phosphatase 56  39 - 117 U/L   Total Bilirubin 0.8  0.3 - 1.2 mg/dL   Bilirubin, Direct 0.2  0.0 - 0.3 mg/dL   Indirect Bilirubin 0.6  0.3 - 0.9 mg/dL  MAGNESIUM     Status: None   Collection Time    09/08/12  4:46 AM      Result Value Range   Magnesium 1.8  1.5 - 2.5 mg/dL  GLUCOSE, CAPILLARY     Status: Abnormal   Collection Time    09/08/12  8:48 PM      Result Value Range   Glucose-Capillary 233 (*) 70 - 99 mg/dL   Comment 1 Notify RN     Comment 2 Documented in Chart    CBC     Status: Abnormal   Collection Time    09/09/12  4:51 AM      Result Value Range   WBC 5.2  4.0 - 10.5 K/uL   RBC 4.29  4.22 - 5.81 MIL/uL   Hemoglobin 13.8  13.0 - 17.0 g/dL   HCT 95.6  21.3 - 08.6 %   MCV 93.0  78.0 - 100.0 fL   MCH 32.2  26.0 - 34.0 pg   MCHC 34.6  30.0 - 36.0 g/dL   RDW 57.8  46.9 - 62.9 %   Platelets 121 (*) 150 - 400 K/uL  COMPREHENSIVE METABOLIC PANEL     Status: Abnormal  Collection Time    09/09/12  4:51 AM      Result Value Range   Sodium 141  135 - 145 mEq/L   Potassium 3.5  3.5 - 5.1 mEq/L   Chloride 106  96 - 112 mEq/L   CO2 26  19 - 32 mEq/L   Glucose, Bld 179 (*) 70 - 99 mg/dL   BUN 14  6 - 23 mg/dL   Creatinine, Ser 1.61  0.50 - 1.35 mg/dL   Calcium 9.1  8.4 - 09.6 mg/dL   Total Protein 5.9 (*) 6.0 - 8.3 g/dL   Albumin 3.0 (*) 3.5 - 5.2 g/dL   AST 38 (*) 0 - 37 U/L   ALT 44  0 - 53 U/L   Alkaline Phosphatase 54  39 - 117 U/L   Total Bilirubin 0.6  0.3 - 1.2 mg/dL   GFR calc non Af Amer 76 (*) >90 mL/min   GFR calc Af Amer 88 (*) >90 mL/min   Comment:            The eGFR has been calculated     using the CKD EPI equation.     This calculation has not been     validated in all clinical     situations.     eGFR's persistently     <90 mL/min signify     possible Chronic Kidney Disease.    Dg Chest 1 View  09/07/2012   *RADIOLOGY REPORT*  Clinical Data: Altered mental  status, fever  CHEST - 1 VIEW  Comparison: February 18, 2012  Findings: There is tenting of right hemidiaphragm unchanged.  There is no focal infiltrate, pulmonary edema or pleural effusion.  The patient is status post CABG.  The mediastinal contour and cardiac silhouette are stable.  Postsurgical changes of the right ribs and right lung are stable.  IMPRESSION: Chronic changes of right chest.  No acute cardiopulmonary disease identified.   Original Report Authenticated By: Sherian Rein, M.D.   Dg Lumbar Spine Complete  09/07/2012   *RADIOLOGY REPORT*  Clinical Data: Low back pain.  No known injury.  LUMBAR SPINE - COMPLETE 4+ VIEW  Comparison: 05/31/2010  Findings: Prior vertebroplasty L1.  Normal alignment.  No acute fracture.  Degenerative disc disease changes at L5 S1 with disc space narrowing, stable.  Mild degenerative facet disease in the lower lumbar spine.  Sclerosis around the left SI joint compatible with sacroiliitis.  IMPRESSION: Prior L1 vertebroplasty.  Degenerative changes.  No acute bony abnormality.   Original Report Authenticated By: Charlett Nose, M.D.   Ct Head Wo Contrast  09/07/2012   *RADIOLOGY REPORT*  Clinical Data: Altered mental status, history of lung and prostate cancer  CT HEAD WITHOUT CONTRAST  Technique:  Contiguous axial images were obtained from the base of the skull through the vertex without contrast.  Comparison: 11/27/2011  Findings: No skull fracture is noted.  Stable probable calcified meningioma in the right frontal region no surrounding mass effect or edema.  No intracranial hemorrhage, mass effect or midline shift. Paranasal sinuses and mastoid air cells are unremarkable.  No acute cortical infarction.  No mass lesion is noted on this unenhanced scan.  Stable cerebral atrophy.  Stable periventricular and patchy subcortical white matter decreased attenuation consistent with chronic small vessel ischemic changes. No parenchymal brain lesion is identified on this unenhanced  scan.  IMPRESSION: No acute intracranial abnormality.  Stable extra-axial probable calcified meningioma in the right frontal region.  Stable atrophy and chronic  white matter disease.  No definite acute cortical infarction.   Original Report Authenticated By: Natasha Mead, M.D.   Mr Laqueta Jean Wo Contrast  09/07/2012   *RADIOLOGY REPORT*  Clinical Data: Altered mental status.  Weakness.  History of prostate cancer.  MRI HEAD WITHOUT AND WITH CONTRAST  Technique:  Multiplanar, multiecho pulse sequences of the brain and surrounding structures were obtained according to standard protocol without and with intravenous contrast  Contrast: 9mL MULTIHANCE GADOBENATE DIMEGLUMINE 529 MG/ML IV SOLN  Comparison: 09/07/2012 CT.  09/29/2007 MR.  Findings: Motion degraded exam.  No acute infarct.  No intracranial hemorrhage.  Anterior superior right frontal 2.8 x 2.2 x 0.7 cm en plaque meningioma stable since 2009.  No other intracranial mass or abnormal enhancement.  Moderate small vessel disease type changes.  Global atrophy without hydrocephalus.  Major intracranial vascular structures are patent.  Minimal mucosal thickening ethmoid sinus air cells.  Mild transverse ligament hypertrophy.  Cervical medullary junction, pituitary region, pineal region and orbital structures unremarkable.  IMPRESSION: Motion degraded exam.  No acute infarct.  No intracranial hemorrhage.  Anterior superior right frontal 2.8 x 2.2 x 0.7 cm en plaque meningioma stable since 2009.  Moderate small vessel disease type changes.   Original Report Authenticated By: Lacy Duverney, M.D.        Jacen Carlini A. Gerilyn Pilgrim, M.D.  Diplomate, Biomedical engineer of Psychiatry and Neurology ( Neurology). 09/09/2012, 7:43 AM

## 2012-09-09 NOTE — Progress Notes (Signed)
[facility] PROGRESS NOTE  Thomas [name] UJW:[id number] DOB: 01-02-1928 DOA: 09/07/2012 PCP: [name], MD  Assessment/Plan: 1. Acute encephalopathy: Etiology unclear but suspect significant chronic component. Urinalysis was negative, urine culture no growth, CT of the head unremarkable as was chest x-ray. Urine drug screen was appropriately positive. No history of abuse of his medications. MRI of the brain also unremarkable. Neurology consult in process. TSH normal. No signs of infection. 2. Acute renal failure: Resolved. Etiology unclear. Prerenal pattern suggest dehydration. 3. Diabetes mellitus with hyperglycemia: Adjust Lantus. Add sliding scale insulin. 4. Chronic back pain: Apparently no change in this. Did well with physical therapy. 5. Thrombocytopenia: Etiology unclear. Follow with repeat CBC in the morning. 6. Suspected vitamin B 12 deficiency.  Comment: Patient alert follows commands and calm. I suspect an underlying component of dementia. Need to clarify further with wife. If patient not agreeable to skilled nursing facility consider psychiatry consultation for capacity assessment.   Start sliding scale insulin, check hemoglobin A1c  Replete vitamin B12  Code Status: Full code DVT prophylaxis: Heparin Family Communication: None present Disposition Plan: SNF?  Brendia Sacks, MD  [facility]  Pager [phone] If 7PM-7AM, please contact night-coverage at [email or web address], password [facility] 09/09/2012, 2:59 PM  LOS: 2 days   Clinical Summary: 79 year old man with history of chronic back pain rectum emergency department for confusion and possible hallucinations beginning approximately 8/1. Initial workup in the emergency department was relatively unremarkable with the exception of mild LFT elevation and creatinine 1.4. He was admitted for acute encephalopathy of unclear etiology, acute renal failure, hyperglycemia  Consultants:  Physical therapy: Skilled  nursing facility  Procedures:  EEG pending for 8/7  HPI/Subjective: Denies complaints whatever. No nausea or vomiting. Eating fine. Feels fine.  Objective: Filed Vitals:   09/08/12 2300 09/09/12 0500 09/09/12 0739 09/09/12 1446  BP: 144/55 134/70  142/62  Pulse: 81 76 70 72  Temp: 97.9 F (36.6 C) 98.1 F (36.7 C)  98 F (36.7 C)  TempSrc: Oral Oral  Oral  Resp: 17 18  18   Height:      Weight:      SpO2: 97% 97% 95% 96%    Intake/Output Summary (Last 24 hours) at 09/09/12 1459 Last data filed at 09/09/12 1350  Gross per 24 hour  Intake   1023 ml  Output   3575 ml  Net  -2552 ml     Filed Weights   09/07/12 1257 09/07/12 1939  Weight: 86.183 kg (190 lb) 89.903 kg (198 lb 3.2 oz)    Exam:   Afebrile, vital signs stable. Excellent urine output charted. Adequate oral intake.  General: Appears calm and comfortable. Speech fluent and clear.  Psychiatric: Grossly normal mood and affect. Speech fluent and appropriate. Does appear to be confused.  Cardiovascular: Regular rate and rhythm. No murmur, rub, gallop. No lower extremity edema.  Respiratory: Clear to auscultation bilaterally. No wheezes, rales, rhonchi. Normal respiratory effort.  Abdomen: Soft, nontender, nondistended.  Skin appears grossly unremarkable  Was discussed with grossly normal tone and strength all extremities.  Data Reviewed:  Acute renal failure has resolved. Serum transaminase elevations have nearly resolved.  Blood sugars have been running in the 200s  Mild thrombocytopenia persists.  Urine culture no growth, final  TSH normal  Vitamin B12 low  Pending studies:   Blood cultures no growth to date  Scheduled Meds: . albuterol  4 mg Oral TID  . atorvastatin  20 mg Oral q1800  . diltiazem  240  mg Oral q morning - 10a  . heparin  5,000 Units Subcutaneous Q8H  . insulin glargine  50 Units Subcutaneous QHS  . omega-3 acid ethyl esters  1 g Oral BID  . sodium chloride  3 mL  Intravenous Q12H   Continuous Infusions:   Principal Problem:   Acute encephalopathy Active Problems:   DIABETES MELLITUS, TYPE II   HYPERLIPIDEMIA-MIXED   CAD, ARTERY BYPASS GRAFT   Right bundle branch block and left anterior fascicular block   COPD   Acute renal failure   Time spent 30 minutes

## 2012-09-09 NOTE — Progress Notes (Signed)
Inpatient Diabetes Program Recommendations  AACE/ADA: New Consensus Statement on Inpatient Glycemic Control (2013)  Target Ranges:  Prepandial:   less than 140 mg/dL      Peak postprandial:   less than 180 mg/dL (1-2 hours)      Critically ill patients:  140 - 180 mg/dL   Results for MICHAE, GRIMLEY (MRN [id number]) as of 09/09/2012 08:22  Ref. Range 09/07/2012 13:21 09/07/2012 21:16 09/08/2012 20:48  Glucose-Capillary Latest Range: 70-99 mg/dL 782 (H) 956 (H) 213 (H)    Inpatient Diabetes Program Recommendations Correction (SSI): Please order CBGs with Novolog correction ACHS. HgbA1C: Please order an A1C to determine glycemic control over the past 2-3 months.  Note: Patient has a history of diabetes and takes Lantus 100 units QHS at home for diabetes management. Currently, patient is ordered to receive Lantus 50 units QHS for inpatient glycemic control. Initial blood glucose noted to be 290 mg/dl on 0/8/65 and last H8I in the chart was 6.4% on 07/27/2010. Please order CBGs with Novolog correction ACSH while inpatient and order an A1C. Will continue to follow.  Thanks,  Orlando Penner, RN, MSN, CCRN  Diabetes Coordinator  Inpatient Diabetes Program  [phone]

## 2012-09-10 ENCOUNTER — Inpatient Hospital Stay
Admission: RE | Admit: 2012-09-10 | Discharge: 2012-09-15 | Disposition: A | Discharge status: Home / Self Care | Payer: Medicare Other | Source: Ambulatory Visit | Attending: Internal Medicine | Admitting: Internal Medicine

## 2012-09-10 ENCOUNTER — Inpatient Hospital Stay (HOSPITAL_COMMUNITY): Admit: 2012-09-10 | Discharge: 2012-09-10 | Disposition: A | Discharge status: Home / Self Care | Payer: Medicare Other

## 2012-09-10 DIAGNOSIS — N179 Acute kidney failure, unspecified: Secondary | ICD-10-CM

## 2012-09-10 LAB — CBC
Hemoglobin: 13.9 g/dL (ref 13.0–17.0)
MCHC: 33.7 g/dL (ref 30.0–36.0)
RBC: 4.42 MIL/uL (ref 4.22–5.81)

## 2012-09-10 LAB — GLUCOSE, CAPILLARY
Glucose-Capillary: 160 mg/dL — ABNORMAL HIGH (ref 70–99)
Glucose-Capillary: 118 mg/dL — ABNORMAL HIGH (ref 70–99)
Glucose-Capillary: 153 mg/dL — ABNORMAL HIGH (ref 70–99)

## 2012-09-10 LAB — HEMOGLOBIN A1C
Hgb A1c MFr Bld: 7 % — ABNORMAL HIGH (ref <5.7)
Mean Plasma Glucose: 154 mg/dL — ABNORMAL HIGH (ref <117)

## 2012-09-10 MED ORDER — CYANOCOBALAMIN 1000 MCG/ML IJ SOLN
1000.0000 ug | Freq: Once | INTRAMUSCULAR | Status: AC
  Administered 2012-09-10: 1000 ug via INTRAMUSCULAR
  Filled 2012-09-10: qty 1

## 2012-09-10 MED ORDER — ALPRAZOLAM 1 MG PO TABS: 1.0000 mg | ORAL_TABLET | Freq: Every evening | ORAL | Status: DC | PRN

## 2012-09-10 MED ORDER — CYANOCOBALAMIN 1000 MCG/ML IJ SOLN: 1000.0000 ug | Freq: Every day | INTRAMUSCULAR | Status: DC

## 2012-09-10 MED ORDER — INSULIN GLARGINE 100 UNIT/ML ~~LOC~~ SOLN: 55.0000 [IU] | Freq: Every day | SUBCUTANEOUS | Status: DC

## 2012-09-10 NOTE — Progress Notes (Signed)
UR chart review completed.  

## 2012-09-10 NOTE — Progress Notes (Signed)
EEG Completed; Results Pending  

## 2012-09-10 NOTE — Discharge Summary (Signed)
Physician Discharge Summary  Thomas [name]:[id number] DOB: [birth year] DOA: 09/07/2012  PCP: [name], MD  Admit date: 09/07/2012 Discharge date: 09/10/2012  Recommendations for Outpatient Follow-up:  1. Followup suspected dementia based on history, see below  2. Followup EEG 3. Followup mild intermittent thrombocytopenia, consider repeat CBC in one to 2 weeks 4. Followup suspected vitamin B 12 deficiency  5. Followup mild elevation of transaminases on statin  Follow-up Information   Follow up with [name], MD In 2 weeks.   Contact information:   [address] [place] [address] [phone]       Follow up with [facility], KOFI, MD. Schedule an appointment as soon as possible for a visit in 4 weeks.   Contact information:   [address] [place] [address] [phone]      Discharge Diagnoses:  1. Acute encephalopathy 2. Suspected dementia 3. Acute renal failure 4. Diabetes mellitus with hyperglycemia 5. Chronic back pain, stable 6. Suspected vitamin B 12 deficiency 7. Mild intermittent thrombocytopenia 8. Mild elevation of transaminases  Discharge Condition: Improved Disposition: Skilled nursing facility for short-term rehabilitation  Diet recommendation: Diabetic diet  Filed Weights   09/07/12 1257 09/07/12 1939  Weight: 86.183 kg (190 lb) 89.903 kg (198 lb 3.2 oz)    History of present illness:  79 year old man with history of chronic back pain presented to the emergency department for confusion and possible hallucinations beginning approximately 8/1. workup in the emergency department was relatively unremarkable with the exception of mild LFT elevation and creatinine 1.4. He was admitted for acute encephalopathy of unclear etiology, acute renal failure, hyperglycemia  Hospital Course:  Mr. Thomas [name]'s mental status gradually and spontaneously improved. Infectious workup was negative as was imaging. He was  seen by neurology and consideration was given to a chronic component, dementia labs were obtained and because of his history of meningioma an EEG was obtained which is pending at this point. The patient had no seizure activity and he is clearly more oriented today. Etiology of acute delirium is unclear but in discussion with the patient's primary care physician as well as the patient's wife he has clear memory problems and these have been progressive over the last 2 or so years, see below. Other issues as detailed below improved or remained stable. He was evaluated by physical therapy and skilled nursing facility for rehabilitation was recommended. Based on history obtained and clinical improvement the patient appears to be at or near baseline and is stable for transfer.   1. Acute encephalopathy: Continues to resolve spontaneously, less confused today, more oriented. Appears to be at baseline, see discussion below. Acute component cause unclear. Urinalysis was negative, urine culture no growth, CT of the head unremarkable as was chest x-ray. Urine drug screen was appropriately positive. No history of abuse of his medications. Remains afebrile, normal white blood cell count, no evidence of infection. MRI of the brain also unremarkable. Neurology consult appreciated. TSH normal.  2. Acute renal failure: Resolved. Etiology unclear. Prerenal pattern suggest dehydration. 3. Diabetes mellitus with hyperglycemia: Blood sugars stable. Continue Lantus and sliding scale insulin. 4. Chronic back pain: Apparently no change in this. Did well with physical therapy. Patient denies pain today. 5. Thrombocytopenia: stable. Significance unclear. Intermittent thrombocytopenia has been seen in the past including 07/2010, 01/2010, 12/2006. 6. Suspected vitamin B 12 deficiency: replete 7. Memory loss, suspected dementia: Discussed case with patient's primary care physician--he confirms that the patient does have significant  memory issues. Wife  confirms the same. 8. Mild elevation of transaminases: Essentially resolved. Possibly secondary to statin.  Comment: Case discussed with patient's primary care physician--Dr. Sherwood Gambler confirms that the patient has long-standing memory issues/suspected dementia. Also discussed with wife--she reports that he has been going "downhill" for the last 2 years or so. He has become fixated on his blood sugars and will test numerous times per day wasting many strips. He also ruminates on the past frequently and has some depression as his children do not visit him. She keeps a close eye on him to prevent mishaps. Neighbors have reported to her that he has been forgetful and displayed odd behavior including sitting in his car without going anywhere. Taken together with history and clinical findings the patient appears to be at baseline at this point. There was report of hallucinations prior to admission but the patient has had none since admission. His wife reports long-standing behavioral issues. His behavior in the hospital seems consonant with that reported by those that know him well. Wife has great difficulty caring for him at home. EEG will be performed today and afterwards rehabilitation will be pursued. There is no evidence of seizure activity. Outpatient neurology followup would be of benefit as would formal testing for dementia.   Discharge Instructions   Future Appointments Provider Department Dept Phone   09/10/2012 2:30 PM [facility] EEG [phone]   02/16/2013 12:00 PM Alleen Borne, MD [facility] [phone]       Medication List         albuterol 4 MG tablet  Commonly known as:  PROVENTIL  Take 4 mg by mouth 3 (three) times daily.     ALPRAZolam 1 MG tablet  Commonly known as:  XANAX  Take 1 mg by mouth at bedtime as needed. For sleep     cyanocobalamin 1000 MCG/ML injection  Commonly known as:   (VITAMIN B-12)  Inject 1 mL (1,000 mcg total) into the muscle daily. Last dose 8/12. Then once a week x4. Then once a month.     diltiazem 240 MG 24 hr capsule  Commonly known as:  CARDIZEM CD  Take 1 capsule (240 mg total) by mouth every morning.     Fish Oil 1200 MG Caps  Take 1 capsule by mouth 2 (two) times daily.     insulin glargine 100 UNIT/ML injection  Commonly known as:  LANTUS  Inject 0.55 mLs (55 Units total) into the skin at bedtime.     pravastatin 80 MG tablet  Commonly known as:  PRAVACHOL  Take 1 tablet (80 mg total) by mouth every evening.       No Known Allergies  The results of significant diagnostics from this hospitalization (including imaging, microbiology, ancillary and laboratory) are listed below for reference.    Significant Diagnostic Studies: Dg Chest 1 View  09/07/2012   *RADIOLOGY REPORT*  Clinical Data: Altered mental status, fever  CHEST - 1 VIEW  Comparison: February 18, 2012  Findings: There is tenting of right hemidiaphragm unchanged.  There is no focal infiltrate, pulmonary edema or pleural effusion.  The patient is status post CABG.  The mediastinal contour and cardiac silhouette are stable.  Postsurgical changes of the right ribs and right lung are stable.  IMPRESSION: Chronic changes of right chest.  No acute cardiopulmonary disease identified.   Original Report Authenticated By: Sherian Rein, M.D.   Dg Lumbar Spine Complete  09/07/2012   *RADIOLOGY REPORT*  Clinical Data: Low back pain.  No known injury.  LUMBAR SPINE - COMPLETE 4+ VIEW  Comparison: 05/31/2010  Findings: Prior vertebroplasty L1.  Normal alignment.  No acute fracture.  Degenerative disc disease changes at L5 S1 with disc space narrowing, stable.  Mild degenerative facet disease in the lower lumbar spine.  Sclerosis around the left SI joint compatible with sacroiliitis.  IMPRESSION: Prior L1 vertebroplasty.  Degenerative changes.  No acute bony abnormality.   Original Report  Authenticated By: Charlett Nose, M.D.   Ct Head Wo Contrast  09/07/2012   *RADIOLOGY REPORT*  Clinical Data: Altered mental status, history of lung and prostate cancer  CT HEAD WITHOUT CONTRAST  Technique:  Contiguous axial images were obtained from the base of the skull through the vertex without contrast.  Comparison: 11/27/2011  Findings: No skull fracture is noted.  Stable probable calcified meningioma in the right frontal region no surrounding mass effect or edema.  No intracranial hemorrhage, mass effect or midline shift. Paranasal sinuses and mastoid air cells are unremarkable.  No acute cortical infarction.  No mass lesion is noted on this unenhanced scan.  Stable cerebral atrophy.  Stable periventricular and patchy subcortical white matter decreased attenuation consistent with chronic small vessel ischemic changes. No parenchymal brain lesion is identified on this unenhanced scan.  IMPRESSION: No acute intracranial abnormality.  Stable extra-axial probable calcified meningioma in the right frontal region.  Stable atrophy and chronic white matter disease.  No definite acute cortical infarction.   Original Report Authenticated By: Natasha Mead, M.D.   Mr Laqueta Jean Wo Contrast  09/07/2012   *RADIOLOGY REPORT*  Clinical Data: Altered mental status.  Weakness.  History of prostate cancer.  MRI HEAD WITHOUT AND WITH CONTRAST  Technique:  Multiplanar, multiecho pulse sequences of the brain and surrounding structures were obtained according to standard protocol without and with intravenous contrast  Contrast: 9mL MULTIHANCE GADOBENATE DIMEGLUMINE 529 MG/ML IV SOLN  Comparison: 09/07/2012 CT.  09/29/2007 MR.  Findings: Motion degraded exam.  No acute infarct.  No intracranial hemorrhage.  Anterior superior right frontal 2.8 x 2.2 x 0.7 cm en plaque meningioma stable since 2009.  No other intracranial mass or abnormal enhancement.  Moderate small vessel disease type changes.  Global atrophy without hydrocephalus.  Major  intracranial vascular structures are patent.  Minimal mucosal thickening ethmoid sinus air cells.  Mild transverse ligament hypertrophy.  Cervical medullary junction, pituitary region, pineal region and orbital structures unremarkable.  IMPRESSION: Motion degraded exam.  No acute infarct.  No intracranial hemorrhage.  Anterior superior right frontal 2.8 x 2.2 x 0.7 cm en plaque meningioma stable since 2009.  Moderate small vessel disease type changes.   Original Report Authenticated By: Lacy Duverney, M.D.    Microbiology: Recent Results (from the past 240 hour(s))  URINE CULTURE     Status: None   Collection Time    09/07/12  1:25 PM      Result Value Range Status   Specimen Description URINE, CATHETERIZED   Final   Special Requests NONE   Final   Culture  Setup Time     Final   Value: 09/08/2012 00:28     Performed at [facility] Count     Final   Value: NO GROWTH     Performed at [facility]   Culture     Final   Value: NO GROWTH     Performed at [facility]   Report Status 09/08/2012 FINAL  Final  CULTURE, BLOOD (ROUTINE X 2)     Status: None   Collection Time    09/07/12  1:27 PM      Result Value Range Status   Specimen Description BLOOD LEFT ANTECUBITAL   Final   Special Requests BOTTLES DRAWN AEROBIC AND ANAEROBIC 10CC   Final   Culture NO GROWTH 3 DAYS   Final   Report Status PENDING   Incomplete  CULTURE, BLOOD (ROUTINE X 2)     Status: None   Collection Time    09/07/12  1:34 PM      Result Value Range Status   Specimen Description BLOOD RIGHT ANTECUBITAL   Final   Special Requests     Final   Value: BOTTLES DRAWN AEROBIC AND ANAEROBIC AEB=12CC ANA=15CC   Culture NO GROWTH 3 DAYS   Final   Report Status PENDING   Incomplete     Labs: Basic Metabolic Panel:  Recent Labs Lab 09/07/12 1323 09/08/12 0446 09/09/12 0451  NA 135 137 141  K 4.0 3.6 3.5  CL 100 103 106  CO2 22 23 26   GLUCOSE 290* 176* 179*  BUN 33* 22 14   CREATININE 1.44* 1.12 0.93  CALCIUM 9.4 9.1 9.1  MG  --  1.8  --    Liver Function Tests:  Recent Labs Lab 09/07/12 1323 09/08/12 0446 09/09/12 0451  AST 48* 34 38*  ALT 66* 50 44  ALKPHOS 62 56 54  BILITOT 0.9 0.8 0.6  PROT 6.7 6.0 5.9*  ALBUMIN 3.5 3.1* 3.0*    Recent Labs Lab 09/07/12 1334  AMMONIA 23   CBC:  Recent Labs Lab 09/07/12 1323 09/08/12 0446 09/09/12 0451 09/10/12 0450  WBC 8.7 5.9 5.2 5.1  NEUTROABS 6.9  --   --   --   HGB 14.6 13.7 13.8 13.9  HCT 43.3 40.8 39.9 41.2  MCV 94.5 94.0 93.0 93.2  PLT 136* 135* 121* 124*   Cardiac Enzymes:  Recent Labs Lab 09/07/12 1323  TROPONINI <0.30   CBG:  Recent Labs Lab 09/08/12 2048 09/09/12 2147 09/10/12 0054 09/10/12 0709 09/10/12 1143  GLUCAP 233* 174* 160* 118* 153*    Principal Problem:   Acute encephalopathy Active Problems:   DIABETES MELLITUS, TYPE II   HYPERLIPIDEMIA-MIXED   CAD, ARTERY BYPASS GRAFT   Right bundle branch block and left anterior fascicular block   COPD   Acute renal failure   Time coordinating discharge: 45 minutes  Signed:  Brendia Sacks, MD [facility] 09/10/2012, 2:28 PM

## 2012-09-10 NOTE — Progress Notes (Signed)
Patient for d/c today to SNF bed at [facility]. Family and patient agreeable. RN to call report to SNF and transfer patient over to SNF Reece Levy, MSW

## 2012-09-10 NOTE — Procedures (Signed)
[facility] Netanya Yazdani A. Gerilyn Pilgrim, MD     [email or web address]        NAME:  Thomas [name], Thomas [name]              ACCOUNT NO.:  [id number]  MEDICAL RECORD NO.:  [id number]  LOCATION:  A316                          FACILITY:  [facility]  PHYSICIAN:  Rylynn Schoneman A. Gerilyn Pilgrim, M.D. DATE OF BIRTH:  08-Aug-1927  DATE OF PROCEDURE:  09/10/2012 DATE OF DISCHARGE:  09/10/2012                             EEG INTERPRETATION   INDICATION:  An 79 year old man who presents with confusion, altered mental status over his baseline cognition.  The study is done to evaluate for nonconvulsive seizures.  MEDICATIONS:  Proventil, alprazolam, Lipitor, vitamin B12, Cardizem, heparin, insulin.  ANALYSIS:  A 16-channel recording is conducted for 20 minutes.  The recording is done using standard 10/20 measurements.  There is a posterior rhythm that gets as high as 7 hertz bilaterally.  Awake and sleep activities are recorded.  K-complexes with sleep spindles are observed.  Photic stimulation and hyperventilation were not carried out. There is no focal or lateralized slowing.  There is no epileptiform activity observed.  IMPRESSION:  Mild generalized slowing.  Otherwise unremarkable recording of awake and sleep states.     Tajuana Kniskern A. Gerilyn Pilgrim, M.D.     KAD/MEDQ  D:  09/10/2012  T:  09/10/2012  Job:  [id number]

## 2012-09-10 NOTE — Progress Notes (Signed)
[facility] PROGRESS NOTE  BAYLON SANTELLI ZOX:[id number] DOB: 1927-08-10 DOA: 09/07/2012 PCP: [name], MD  Assessment/Plan: 1. Acute encephalopathy: Continues to resolve spontaneously, less confused today, more oriented. Appears to be at baseline, see discussion below. Acute component cause unclear. Urinalysis was negative, urine culture no growth, CT of the head unremarkable as was chest x-ray. Urine drug screen was appropriately positive. No history of abuse of his medications. Remains afebrile, normal white blood cell count, no evidence of infection. MRI of the brain also unremarkable. Neurology consult appreciated. TSH normal.  2. Acute renal failure: Resolved. Etiology unclear. Prerenal pattern suggest dehydration. 3. Diabetes mellitus with hyperglycemia:  Blood sugars stable. Continue Lantus and sliding scale insulin. 4. Chronic back pain: Apparently no change in this. Did well with physical therapy. Patient denies pain today. 5. Thrombocytopenia: stable. Significance unclear. Intermittent thrombocytopenia has been seen in the past including 07/2010, 01/2010, 12/2006. 6. Suspected vitamin B 12 deficiency: replete 7. Memory loss, suspected dementia: Discussed case with patient's primary care physician--he confirms that the patient does have significant memory issues. Wife confirms the same. 8. Mild elevation of transaminases: Essentially resolved. Possibly secondary to statin.  Comment: Case discussed with patient's primary care physician--Dr. Sherwood Gambler confirms that the patient has long-standing memory issues/suspected dementia. Also discussed with wife--she reports that he has been going "downhill" for the last 2 years or so. He has become fixated on his blood sugars and we will test numerous times per day wasting many strips. He also ruminates in the past frequently and has some depression as his children do not visit him. She keeps a close eye on him to prevent mishaps. Neighbors  have reported to her that he has been forgetful and displayed odd behavior and occluding sitting in his car without going anywhere. Taken together with history and clinical findings the patient appears to be at baseline at this point. There was report of hallucinations prior to admission but the patient has had none since admission. His wife reports long-standing behavioral issues. His behavior in the hospital seems consonant with that as confirmed by those that no him well. Wife has great difficulty caring for him at home. EEG will be performed today and afterwards rehabilitation will be pursued. There is no evidence of seizure activity. Outpatient neurology followup would be of benefit as would formal testing for dementia.   Replete vitamin B12  Transfer to SNF today  Code Status: Full code DVT prophylaxis: Heparin Family Communication: as above Disposition Plan: SNF--patient and family agreeable  Brendia Sacks, MD  [facility]  Pager [phone] If 7PM-7AM, please contact night-coverage at [email or web address], password [facility] 09/10/2012, 12:23 PM  LOS: 3 days   Clinical Summary: 79 year old man with history of chronic back pain rectum emergency department for confusion and possible hallucinations beginning approximately 8/1. Initial workup in the emergency department was relatively unremarkable with the exception of mild LFT elevation and creatinine 1.4. He was admitted for acute encephalopathy of unclear etiology, acute renal failure, hyperglycemia  Consultants:  Physical therapy: Skilled nursing facility  Procedures:  EEG pending 8/7  HPI/Subjective: No complaints. Feels well. No pain.  Objective: Filed Vitals:   09/09/12 2053 09/10/12 0100 09/10/12 0553 09/10/12 0740  BP: 126/71 143/62 134/75   Pulse: 65 73 65 58  Temp: 98 F (36.7 C) 97.8 F (36.6 C) 98.1 F (36.7 C)   TempSrc: Oral Oral Oral   Resp: 18 18 18    Height:      Weight:  SpO2: 97% 93% 96% 94%     Intake/Output Summary (Last 24 hours) at 09/10/12 1223 Last data filed at 09/09/12 2220  Gross per 24 hour  Intake    240 ml  Output   1500 ml  Net  -1260 ml     Filed Weights   09/07/12 1257 09/07/12 1939  Weight: 86.183 kg (190 lb) 89.903 kg (198 lb 3.2 oz)    Exam:   Afebrile, vital signs stable. Appears calm and comfortable.  General: Appears well. Calm and comfortable.  Cardiovascular: Regular rate and rhythm. No murmur, rub, gallop. No lower extremity edema.  Telemetry: Sinus rhythm.  Respiratory: Clear to auscultation bilaterally. No wheezes, rales, rhonchi. Normal respiratory effort.  Strength bilateral upper and lower extremities 5/5 and symmetric. Cranial nerves appear grossly intact.  Psychiatric: Alert, oriented to year. Not to month or location.  Data Reviewed:  Capillary blood sugars improved.  Stable thrombocytopenia, mild  RPR nonreactive  Pending studies:   Blood cultures no growth to date  Scheduled Meds: . albuterol  4 mg Oral TID  . atorvastatin  20 mg Oral q1800  . diltiazem  240 mg Oral q morning - 10a  . heparin  5,000 Units Subcutaneous Q8H  . insulin aspart  0-5 Units Subcutaneous QHS  . insulin aspart  0-9 Units Subcutaneous TID WC  . insulin glargine  55 Units Subcutaneous QHS  . omega-3 acid ethyl esters  1 g Oral BID  . sodium chloride  3 mL Intravenous Q12H   Continuous Infusions:   Principal Problem:   Acute encephalopathy Active Problems:   DIABETES MELLITUS, TYPE II   HYPERLIPIDEMIA-MIXED   CAD, ARTERY BYPASS GRAFT   Right bundle branch block and left anterior fascicular block   COPD   Acute renal failure   Time spent 30 minutes

## 2012-09-10 NOTE — Progress Notes (Signed)
Clinical Social Work Department CLINICAL SOCIAL WORK PLACEMENT NOTE 09/10/2012  Patient:  Thomas [name], Thomas [name]  Account Number:  [id number] Admit date:  09/07/2012  Clinical Social Worker:  Santa Genera, CLINICAL SOCIAL WORKER  Date/time:  09/08/2012 03:00 PM  Clinical Social Work is seeking post-discharge placement for this patient at the following level of care:   SKILLED NURSING   (*CSW will update this form in Epic as items are completed)   09/08/2012  Patient/family provided with [facility] Department of Clinical Social Work's list of facilities offering this level of care within the geographic area requested by the patient (or if unable, by the patient's family).  09/08/2012  Patient/family informed of their freedom to choose among providers that offer the needed level of care, that participate in Medicare, Medicaid or managed care program needed by the patient, have an available bed and are willing to accept the patient.  09/08/2012  Patient/family informed of [facility]' ownership interest in [facility], as well as of the fact that they are under no obligation to receive care at this facility.  PASARR submitted to EDS on 09/08/2012 PASARR number received from EDS on 09/08/2012  FL2 transmitted to all facilities in geographic area requested by pt/family on  09/08/2012 FL2 transmitted to all facilities within larger geographic area on   Patient informed that his/her managed care company has contracts with or will negotiate with  certain facilities, including the following:     Patient/family informed of bed offers received:  09/09/2012 Patient chooses bed at [facility] Physician recommends and patient chooses bed at    Patient to be transferred to [facility] on  09/10/2012 Patient to be transferred to facility by   The following physician request were entered in Epic:   Additional Comments: Reece Levy, MSW [phone]

## 2012-09-10 NOTE — Progress Notes (Signed)
Patient transferred to [facility] by [facility] via [facility].  Packet with patient.  Report called and given to [place] at facility.  IV removed - WNL.  Patient dressed in his own clothing for transfer.

## 2012-09-10 NOTE — Progress Notes (Signed)
Met with patient and discussed the need for SNF for a short while prior to d/c home. Patient is resistant- stating, " I want to go home". Talked with him about the benefits of going to SNF for some rehab- he is agreeable and accepts transfer today to [facility] SN[facility]. Wife en route to do paperwork at SNF. Will plan transfer after MD completes d/c summary and EEG.   Reece Levy, MSW [phone]

## 2012-09-10 NOTE — Progress Notes (Signed)
Physical Therapy Treatment Patient Details Name: Thomas [name] MRN: [id number] DOB: 12-14-1927 Today's Date: 09/10/2012 Time: 4098-1191 PT Time Calculation (min): 27 min  PT Assessment / Plan / Recommendation  History of Present Illness     PT Comments   Pt with stable standing balance without HHA prior gait training while changing gown.  No LOB noted.  Pt required multiple verbal cueing with gait training to stay within walker, to improve posture, as well as equalize and increase stride length for more normalized gait mechanics.  Pt confused with visitors earlier this morning and why wife not here.  Multiple cueing required to stay on task this session.  Pt left in chair with chair alarm set, call bell within reach and sitter present.    Follow Up Recommendations        Does the patient have the potential to tolerate intense rehabilitation     Barriers to Discharge        Equipment Recommendations       Recommendations for Other Services    Frequency     Progress towards PT Goals    Plan      Precautions / Restrictions Precautions Precautions: Fall Restrictions Weight Bearing Restrictions: No    Mobility  Bed Mobility Bed Mobility: Supine to Sit Supine to Sit: 6: Modified independent (Device/Increase time);HOB elevated Transfers Transfers: Sit to Stand;Stand to Sit Sit to Stand: 4: Min guard;Without upper extremity assist Stand to Sit: 4: Min guard;Without upper extremity assist Ambulation/Gait Ambulation/Gait Assistance: 5: Supervision Ambulation Distance (Feet): 280 Feet Assistive device: Rolling walker Ambulation/Gait Assistance Details: Pt required multiple vc's for posture, improve stride length and to stay within walker. Gait Pattern: Shuffle;Trunk flexed Gait velocity: Normal Stairs: No Wheelchair Mobility Wheelchair Mobility: No    Exercises     PT Diagnosis:    PT Problem List:   PT Treatment Interventions:     PT Goals (current goals can now be  found in the care plan section)    Visit Information  Last PT Received On: 09/10/12    Subjective Data   Where is my wife at?   Cognition  Cognition Arousal/Alertness: Awake/alert Behavior During Therapy: WFL for tasks assessed/performed Overall Cognitive Status: Impaired/Different from baseline Memory: Decreased recall of precautions;Decreased short-term memory    Balance     End of Session PT - End of Session Equipment Utilized During Treatment: Gait belt Activity Tolerance: Patient tolerated treatment well Patient left: in chair;with call bell/phone within reach;with chair alarm set;with nursing/sitter in room Nurse Communication: Mobility status   GP     Juel Burrow 09/10/2012, 10:56 AM

## 2012-09-11 ENCOUNTER — Other Ambulatory Visit: Payer: Self-pay | Admitting: Geriatric Medicine

## 2012-09-11 LAB — GLUCOSE, CAPILLARY
Glucose-Capillary: 129 mg/dL — ABNORMAL HIGH (ref 70–99)
Glucose-Capillary: 177 mg/dL — ABNORMAL HIGH (ref 70–99)

## 2012-09-11 MED ORDER — ALPRAZOLAM 1 MG PO TABS: 1.0000 mg | ORAL_TABLET | Freq: Every evening | ORAL | Status: DC | PRN

## 2012-09-12 ENCOUNTER — Non-Acute Institutional Stay (SKILLED_NURSING_FACILITY): Payer: Medicare Other | Admitting: Internal Medicine

## 2012-09-12 DIAGNOSIS — E119 Type 2 diabetes mellitus without complications: Secondary | ICD-10-CM

## 2012-09-12 DIAGNOSIS — R6889 Other general symptoms and signs: Secondary | ICD-10-CM

## 2012-09-12 DIAGNOSIS — R899 Unspecified abnormal finding in specimens from other organs, systems and tissues: Secondary | ICD-10-CM

## 2012-09-12 DIAGNOSIS — G9340 Encephalopathy, unspecified: Secondary | ICD-10-CM

## 2012-09-12 DIAGNOSIS — D696 Thrombocytopenia, unspecified: Secondary | ICD-10-CM

## 2012-09-12 DIAGNOSIS — J449 Chronic obstructive pulmonary disease, unspecified: Secondary | ICD-10-CM

## 2012-09-12 DIAGNOSIS — E785 Hyperlipidemia, unspecified: Secondary | ICD-10-CM

## 2012-09-12 LAB — CULTURE, BLOOD (ROUTINE X 2)
Culture: NO GROWTH
Culture: NO GROWTH

## 2012-09-12 NOTE — Progress Notes (Signed)
Patient ID: Thomas [name], male   DOB: 1927/04/14, 79 y.o.   MRN: [id number]  This is an acute visit.  Level of care skilled.  Facility [facility].  Date is 09/12/2012.  Chief complaint asked acute visit status post hospitalization for encephalopathy.  History of present illness.  Patient is a pleasant 79 year old male with a history of chronic back pain he presented to the ER with increased confusion and possible hallucinations.  ER workup was unremarkable except for mild liver function test elevation and a creatinine of 1.4.  His mental status gradually improved.  Infectious workup was negative as was imaging.  Neurology did evaluate him and an EEG was obtained secondary to apparently some history of the meningioma It did not show anything remarkable other than occasional mild generalized slowing.  It appears per discussion with patient's wife and primary care provider that he has had progressive memory problems over the past couple years.-- for instance sometimes sittin.  g in the car but not going anywhere  It was thought he would benefit from some rehabilitation in a skilled facility.   Of note workup included a urinalysis CT of the head urine drug screen TSH and MRI of the brain all these were nondiagnostic  Previous medical history.  Encephalopathy which has improved.  Suspected dementia.  Acute renal failure which has resolved.  Elevated liver function tests which have normalized out possibly secondary to statin.  Diabetes type 2.  Chronic back pain.  Suspected vitamin B12 deficiency.  Intermitted thrombocytopenia.  History of prostate cancer.  History of lung cancer.  Previous surgical history.  History of tumor removal from lung.  Prostate surgery.  Surgery to repair an aneurysm.  Social history.  Patient is married he is a former smoker one pack per day for 50 years.  He did use smokeless tobacco at well and apparently still uses it t no  history of significant alcohol use  Medications.  Albuterol 4 mg 3 times a day.  Xanax 1 mg each bedtime when necessary.  Vitamin B12 1000 mcg daily until August 12 then q. weekly x4 then q. monthly.  Diltiazem 240 mg 24-hour capsule every morning.  Fish oil 1200 mg twice a day.  Lantus insulin 55 units each bedtime.  Pravachol 80 mg each bedtime  Hospital studies-as noted above MRI CT scan of the head did not show any acute process.  09/07/2012.  CT of the head-did show a probable calcified meningioma  the right frontal region  Subsequent MRI did show stability of this since 2009.  Review of systems.--Limited since patient appears to be a poor historian but he has no acute complaints other than wanting to go home  In general denies any fever or chills.  Skin-does not complain of any itching or rash.  Head ears eyes nose mouth and throat is not complaining of visual changes sore throat or nasal discharge.  Respiratory-does not complain of shortness of breath or cough.  Cardiac no complaints of chest pain.  GI-not complaining of any nausea vomiting or abdominal pain.  Muscle skeletal-does not complaining of joint pain.  Neurologic does not complaining of a headache or dizziness.  Psych does have some history apparently of dementia with recent encephalopathy that has apparently gotten much better  Physical exam.  Temperature is 97.1 pulse 70 respirations 20 blood pressure 116/60 O2 saturation is 96%.  In general this is a pleasant elderly male in no distress he is somewhat confused.  His skin is warm and  dry.  Eyes pupils appear equal round reactive to light visual acuity appears intact.  Oropharynx is clear he is edentulous.  Heart is regular rate and rhythm without murmur gallop or rub.  Chest there is a small amount of wheezing in the lower bases otherwise clear to auscultation no labored breathing.  Abdomen is soft nontender obese with positive bowel  sounds.  GU exam is within normal limits he is circumcised.  Legs he does have arthritic changes of the knees bilaterally but I do not note any deformities there is no lower extremity edema.  Circulation pedal pulses are intact bilaterally.  Neurologic is intact his speech is clear I do not see any lateralizing findings.  Psych he is oriented to self only he is very pleasant  Labs.  09/09/2012.  Sodium 141 potassium 3.5 BUN 14 creatinine 0.93-liver function tests within normal limits except albumin of 3.0 AST of 38 this is trending down.  By the seventh 2014.  WBC 5.1 hemoglobin 13.9 platelets 124 this is relatively baseline.  Hemoglobin A1c 7.0.  T12 level-180.  09/07/2012.  WUJ-8.119.   assessment and plan.  #1 AcipHex myopathy-apparently this has improved patient still appears to be confused apparently this is relatively his baseline-Will have social work conduct a Mini-Mental Status exam and notify us of results-also consider outpatient neurology consult.  #2-history of elevated liver function tests-this was thought possibly secondary to statin it was trending down upon hospital discharge will recheck this next week.  #3 history of mild thrombocytopenia will recheck this as well a CBC with platelets next week.  #4-history of acute renal failure apparently this resolved with hydration in the hospital again we'll check metabolic panel next week.  #5-history of chronic back pain is not complaining of back pain today will continue to monitor this apparently this has been stable for some time.  #6-suspect some history of COPD here he is on albuterol this appears stable a small amount of wheezing on exam today.  #7-B12 deficiency-he continues on supplementation.  #8 past history of hypertension he is on Cardizem at this point this appears to be stable her recent blood pressures.  #9-history of diabetes type 2-he is on Lantus we'll check a CBGs a.c. and at bedtime call  provider for CPE greater than 300 or less than 60.  #10 as history of hyperlipidemia he is on a statin again we'll have to monitor his liver function tests closely.  [id number] note greater than 40 minutes spent assessing patient review of hospital records and formulating and coordinating plan of care for numerous diagnoses --of note greater than 50% of time spent coordinating and formulating a plan of care  .

## 2012-09-13 LAB — GLUCOSE, CAPILLARY
Glucose-Capillary: 138 mg/dL — ABNORMAL HIGH (ref 70–99)
Glucose-Capillary: 161 mg/dL — ABNORMAL HIGH (ref 70–99)

## 2012-09-14 ENCOUNTER — Other Ambulatory Visit: Payer: Self-pay | Admitting: Geriatric Medicine

## 2012-09-14 LAB — GLUCOSE, CAPILLARY
Glucose-Capillary: 121 mg/dL — ABNORMAL HIGH (ref 70–99)
Glucose-Capillary: 87 mg/dL (ref 70–99)

## 2012-09-14 MED ORDER — ALPRAZOLAM 1 MG PO TABS: 1.0000 mg | ORAL_TABLET | Freq: Every evening | ORAL | Status: DC | PRN

## 2012-09-15 ENCOUNTER — Non-Acute Institutional Stay (SKILLED_NURSING_FACILITY): Payer: Medicare Other | Admitting: Internal Medicine

## 2012-09-15 DIAGNOSIS — E785 Hyperlipidemia, unspecified: Secondary | ICD-10-CM

## 2012-09-15 DIAGNOSIS — R899 Unspecified abnormal finding in specimens from other organs, systems and tissues: Secondary | ICD-10-CM

## 2012-09-15 DIAGNOSIS — R6889 Other general symptoms and signs: Secondary | ICD-10-CM

## 2012-09-15 DIAGNOSIS — G9340 Encephalopathy, unspecified: Secondary | ICD-10-CM

## 2012-09-15 DIAGNOSIS — J449 Chronic obstructive pulmonary disease, unspecified: Secondary | ICD-10-CM

## 2012-09-15 DIAGNOSIS — E119 Type 2 diabetes mellitus without complications: Secondary | ICD-10-CM

## 2012-09-15 LAB — GLUCOSE, CAPILLARY
Glucose-Capillary: 98 mg/dL (ref 70–99)
Glucose-Capillary: 164 mg/dL — ABNORMAL HIGH (ref 70–99)

## 2012-09-28 ENCOUNTER — Encounter (HOSPITAL_COMMUNITY): Payer: Self-pay | Admitting: *Deleted

## 2012-09-28 ENCOUNTER — Emergency Department (HOSPITAL_COMMUNITY)
Admission: EM | Admit: 2012-09-28 | Discharge: 2012-09-29 | Disposition: A | Discharge status: Home / Self Care | Payer: Medicare Other | Attending: Emergency Medicine | Admitting: Emergency Medicine

## 2012-09-28 DIAGNOSIS — Z85118 Personal history of other malignant neoplasm of bronchus and lung: Secondary | ICD-10-CM | POA: Insufficient documentation

## 2012-09-28 DIAGNOSIS — Z8546 Personal history of malignant neoplasm of prostate: Secondary | ICD-10-CM | POA: Insufficient documentation

## 2012-09-28 DIAGNOSIS — Z87891 Personal history of nicotine dependence: Secondary | ICD-10-CM | POA: Insufficient documentation

## 2012-09-28 DIAGNOSIS — Z794 Long term (current) use of insulin: Secondary | ICD-10-CM | POA: Insufficient documentation

## 2012-09-28 DIAGNOSIS — Z79899 Other long term (current) drug therapy: Secondary | ICD-10-CM | POA: Insufficient documentation

## 2012-09-28 DIAGNOSIS — Z8679 Personal history of other diseases of the circulatory system: Secondary | ICD-10-CM | POA: Insufficient documentation

## 2012-09-28 DIAGNOSIS — R739 Hyperglycemia, unspecified: Secondary | ICD-10-CM

## 2012-09-28 DIAGNOSIS — E785 Hyperlipidemia, unspecified: Secondary | ICD-10-CM | POA: Insufficient documentation

## 2012-09-28 DIAGNOSIS — E119 Type 2 diabetes mellitus without complications: Secondary | ICD-10-CM | POA: Insufficient documentation

## 2012-09-28 NOTE — ED Notes (Signed)
Pt took 70 units of lantus tonight with a FS of 459, EMS recorded 313 and gave 1L NS

## 2012-09-29 LAB — CBC WITH DIFFERENTIAL/PLATELET
Basophils Relative: 0 % (ref 0–1)
Eosinophils Absolute: 0.2 10*3/uL (ref 0.0–0.7)
Hemoglobin: 14.2 g/dL (ref 13.0–17.0)
Lymphs Abs: 1.5 10*3/uL (ref 0.7–4.0)
MCH: 31.8 pg (ref 26.0–34.0)
MCHC: 33.1 g/dL (ref 30.0–36.0)
Monocytes Relative: 11 % (ref 3–12)
Neutro Abs: 3.6 10*3/uL (ref 1.7–7.7)
Neutrophils Relative %: 60 % (ref 43–77)
Platelets: 269 10*3/uL (ref 150–400)
RBC: 4.46 MIL/uL (ref 4.22–5.81)

## 2012-09-29 LAB — BASIC METABOLIC PANEL
BUN: 17 mg/dL (ref 6–23)
Chloride: 101 mEq/L (ref 96–112)
GFR calc Af Amer: 61 mL/min — ABNORMAL LOW (ref >90)
GFR calc non Af Amer: 53 mL/min — ABNORMAL LOW (ref >90)
Potassium: 4.1 mEq/L (ref 3.5–5.1)
Sodium: 138 mEq/L (ref 135–145)

## 2012-09-29 LAB — GLUCOSE, CAPILLARY: Glucose-Capillary: 368 mg/dL — ABNORMAL HIGH (ref 70–99)

## 2012-09-29 MED ORDER — GI COCKTAIL ~~LOC~~
30.0000 mL | Freq: Once | ORAL | Status: AC
  Administered 2012-09-29: 30 mL via ORAL
  Filled 2012-09-29: qty 30

## 2012-09-29 NOTE — ED Provider Notes (Signed)
CSN: [id number]     Arrival date & time 09/28/12  2343 History   First MD Initiated Contact with Patient 09/28/12 2354     Chief Complaint  Patient presents with  . Hyperglycemia   (Consider location/radiation/quality/duration/timing/severity/associated sxs/prior Treatment) HPI Comments: Patient with past medical history significant for diabetes for which he is treated with Lantus. For the past several days his sugars have been elevated at home. This evening he checked his sugar and found it to be 450. He took 70 units of Lantus which caused his wife to be concerned. She felt as though he took too much of this and wanted him to come to the emergency department for evaluation. He denies to me he is having any discomfort, shortness of breath, or any other symptoms. He tells that he feels fine and he does not want to be here.  Patient is a 79 y.o. male presenting with hyperglycemia. The history is provided by the patient.  Hyperglycemia Blood sugar level PTA:  454 Severity:  Moderate Onset quality:  Sudden Duration:  1 hour Timing:  Constant Progression:  Unchanged Chronicity:  New Diabetes status:  Controlled with insulin Context: not change in medication   Relieved by:  Nothing Ineffective treatments:  None tried Associated symptoms: no abdominal pain, no altered mental status, no dizziness, no dysuria, no fatigue and no fever     Past Medical History  Diagnosis Date  . Diabetes mellitus   . Anginal pain   . Hyperlipemia   . Cancer   . Lung cancer   . Prostate cancer    Past Surgical History  Procedure Laterality Date  . Back surgery    . Tumor removal      from lung  . Prostate surgery    . Cardiac surgery    . Surgery to repair aneurysm     History reviewed. No pertinent family history. History  Substance Use Topics  . Smoking status: Former Smoker -- 1.00 packs/day for 50 years    Types: Cigarettes    Quit date: 12/10/1993  . Smokeless tobacco: Current User  .  Alcohol Use: No    Review of Systems  Constitutional: Negative for fever and fatigue.  Gastrointestinal: Negative for abdominal pain.  Genitourinary: Negative for dysuria.  Neurological: Negative for dizziness.  All other systems reviewed and are negative.    Allergies  Review of patient's allergies indicates no known allergies.  Home Medications   Current Outpatient Rx  Name  Route  Sig  Dispense  Refill  . albuterol (PROVENTIL) 4 MG tablet   Oral   Take 4 mg by mouth 3 (three) times daily.         Marland Kitchen ALPRAZolam (XANAX) 1 MG tablet   Oral   Take 1 tablet (1 mg total) by mouth at bedtime as needed. For sleep   30 tablet   5   . cyanocobalamin (,VITAMIN B-12,) 1000 MCG/ML injection   Intramuscular   Inject 1 mL (1,000 mcg total) into the muscle daily. Last dose 8/12. Then once a week x4. Then once a month.         . diltiazem (CARDIZEM CD) 240 MG 24 hr capsule   Oral   Take 1 capsule (240 mg total) by mouth every morning.   90 capsule   3   . insulin glargine (LANTUS) 100 UNIT/ML injection   Subcutaneous   Inject 0.55 mLs (55 Units total) into the skin at bedtime.         Marland Kitchen  Omega-3 Fatty Acids (FISH OIL) 1200 MG CAPS   Oral   Take 1 capsule by mouth 2 (two) times daily.          . pravastatin (PRAVACHOL) 80 MG tablet   Oral   Take 1 tablet (80 mg total) by mouth every evening.   30 tablet   11    BP 146/61  Pulse 82  Temp(Src) 97.7 F (36.5 C) (Oral)  Resp 20  SpO2 94% Physical Exam  Nursing note and vitals reviewed. Constitutional: He is oriented to person, place, and time. He appears well-developed and well-nourished. No distress.  HENT:  Head: Normocephalic and atraumatic.  Mouth/Throat: Oropharynx is clear and moist.  Eyes: EOM are normal. Pupils are equal, round, and reactive to light.  Neck: Normal range of motion. Neck supple.  Cardiovascular: Normal rate, regular rhythm and normal heart sounds.   Pulmonary/Chest: Effort normal and  breath sounds normal. No respiratory distress. He has no wheezes.  Abdominal: Soft. Bowel sounds are normal. He exhibits no distension. There is no tenderness.  Musculoskeletal: Normal range of motion. He exhibits no edema.  Neurological: He is alert and oriented to person, place, and time.  Skin: Skin is warm and dry. He is not diaphoretic.    ED Course  Procedures (including critical care time) Labs Review Labs Reviewed  CBC WITH DIFFERENTIAL  BASIC METABOLIC PANEL   Imaging Review No results found.  MDM  No diagnosis found. Patient was brought here by EMS complaints of high blood sugar and possibly having taken too much of his Lantus. He has no complaints while in the ER and it appears clinically well. His CBC and basic metabolic panel only showed an elevated glucose. He was initially for 20 and was rechecked sometime later and is now 368. No further workup is indicated at this point and I believe he is stable for discharge.    Geoffery Lyons, MD 09/29/12 [phone]

## 2012-09-29 NOTE — ED Notes (Signed)
Discharge instructions reviewed with pt, questions answered. Pt verbalized understanding.  

## 2012-09-29 NOTE — ED Notes (Signed)
Pt's brother called to come get him

## 2012-09-30 ENCOUNTER — Emergency Department (HOSPITAL_COMMUNITY): Payer: Medicare Other

## 2012-09-30 ENCOUNTER — Inpatient Hospital Stay (HOSPITAL_COMMUNITY)
Admission: EM | Admit: 2012-09-30 | Discharge: 2012-10-02 | DRG: 282 | Disposition: A | Discharge status: Home Health | Payer: Medicare Other | Attending: Internal Medicine | Admitting: Internal Medicine

## 2012-09-30 ENCOUNTER — Encounter (HOSPITAL_COMMUNITY): Payer: Self-pay | Admitting: *Deleted

## 2012-09-30 DIAGNOSIS — R7989 Other specified abnormal findings of blood chemistry: Secondary | ICD-10-CM

## 2012-09-30 DIAGNOSIS — I214 Non-ST elevation (NSTEMI) myocardial infarction: Principal | ICD-10-CM

## 2012-09-30 DIAGNOSIS — R739 Hyperglycemia, unspecified: Secondary | ICD-10-CM

## 2012-09-30 DIAGNOSIS — IMO0001 Reserved for inherently not codable concepts without codable children: Secondary | ICD-10-CM | POA: Diagnosis present

## 2012-09-30 DIAGNOSIS — F0390 Unspecified dementia without behavioral disturbance: Secondary | ICD-10-CM | POA: Diagnosis present

## 2012-09-30 DIAGNOSIS — Z794 Long term (current) use of insulin: Secondary | ICD-10-CM

## 2012-09-30 DIAGNOSIS — Z85118 Personal history of other malignant neoplasm of bronchus and lung: Secondary | ICD-10-CM

## 2012-09-30 DIAGNOSIS — J449 Chronic obstructive pulmonary disease, unspecified: Secondary | ICD-10-CM | POA: Diagnosis present

## 2012-09-30 DIAGNOSIS — Z87891 Personal history of nicotine dependence: Secondary | ICD-10-CM

## 2012-09-30 DIAGNOSIS — R079 Chest pain, unspecified: Secondary | ICD-10-CM | POA: Diagnosis present

## 2012-09-30 DIAGNOSIS — R109 Unspecified abdominal pain: Secondary | ICD-10-CM

## 2012-09-30 DIAGNOSIS — I2510 Atherosclerotic heart disease of native coronary artery without angina pectoris: Secondary | ICD-10-CM | POA: Diagnosis present

## 2012-09-30 DIAGNOSIS — E119 Type 2 diabetes mellitus without complications: Secondary | ICD-10-CM

## 2012-09-30 DIAGNOSIS — E785 Hyperlipidemia, unspecified: Secondary | ICD-10-CM | POA: Diagnosis present

## 2012-09-30 DIAGNOSIS — I452 Bifascicular block: Secondary | ICD-10-CM | POA: Diagnosis present

## 2012-09-30 DIAGNOSIS — Z951 Presence of aortocoronary bypass graft: Secondary | ICD-10-CM

## 2012-09-30 DIAGNOSIS — Z8546 Personal history of malignant neoplasm of prostate: Secondary | ICD-10-CM

## 2012-09-30 DIAGNOSIS — I10 Essential (primary) hypertension: Secondary | ICD-10-CM | POA: Diagnosis present

## 2012-09-30 DIAGNOSIS — J4489 Other specified chronic obstructive pulmonary disease: Secondary | ICD-10-CM | POA: Diagnosis present

## 2012-09-30 DIAGNOSIS — R1013 Epigastric pain: Secondary | ICD-10-CM | POA: Diagnosis present

## 2012-09-30 DIAGNOSIS — I25810 Atherosclerosis of coronary artery bypass graft(s) without angina pectoris: Secondary | ICD-10-CM

## 2012-09-30 LAB — COMPREHENSIVE METABOLIC PANEL
AST: 22 U/L (ref 0–37)
Albumin: 2.9 g/dL — ABNORMAL LOW (ref 3.5–5.2)
BUN: 16 mg/dL (ref 6–23)
Calcium: 9.3 mg/dL (ref 8.4–10.5)
Chloride: 103 mEq/L (ref 96–112)
Creatinine, Ser: 1.21 mg/dL (ref 0.50–1.35)
Total Bilirubin: 0.5 mg/dL (ref 0.3–1.2)
Total Protein: 5.7 g/dL — ABNORMAL LOW (ref 6.0–8.3)

## 2012-09-30 LAB — CBC
HCT: 37.8 % — ABNORMAL LOW (ref 39.0–52.0)
Hemoglobin: 12.7 g/dL — ABNORMAL LOW (ref 13.0–17.0)
MCH: 31.8 pg (ref 26.0–34.0)
MCV: 94.7 fL (ref 78.0–100.0)
Platelets: 236 10*3/uL (ref 150–400)
RBC: 3.99 MIL/uL — ABNORMAL LOW (ref 4.22–5.81)
WBC: 6.4 10*3/uL (ref 4.0–10.5)

## 2012-09-30 LAB — GLUCOSE, CAPILLARY
Glucose-Capillary: 288 mg/dL — ABNORMAL HIGH (ref 70–99)
Glucose-Capillary: 253 mg/dL — ABNORMAL HIGH (ref 70–99)
Glucose-Capillary: 190 mg/dL — ABNORMAL HIGH (ref 70–99)

## 2012-09-30 LAB — TROPONIN I
Troponin I: 2.45 ng/mL (ref <0.30)
Troponin I: 2.89 ng/mL (ref <0.30)
Troponin I: 1.45 ng/mL (ref <0.30)

## 2012-09-30 LAB — HEPARIN LEVEL (UNFRACTIONATED): Heparin Unfractionated: 0.72 IU/mL — ABNORMAL HIGH (ref 0.30–0.70)

## 2012-09-30 LAB — TSH: TSH: 0.784 u[IU]/mL (ref 0.350–4.500)

## 2012-09-30 LAB — HEMOGLOBIN A1C
Hgb A1c MFr Bld: 6.8 % — ABNORMAL HIGH (ref <5.7)
Mean Plasma Glucose: 148 mg/dL — ABNORMAL HIGH (ref <117)

## 2012-09-30 LAB — CBC WITH DIFFERENTIAL/PLATELET
Basophils Absolute: 0 10*3/uL (ref 0.0–0.1)
Basophils Relative: 1 % (ref 0–1)
HCT: 40.3 % (ref 39.0–52.0)
Lymphocytes Relative: 19 % (ref 12–46)
MCHC: 33.3 g/dL (ref 30.0–36.0)
Monocytes Absolute: 0.7 10*3/uL (ref 0.1–1.0)
Neutro Abs: 5.2 10*3/uL (ref 1.7–7.7)
Neutrophils Relative %: 69 % (ref 43–77)
Platelets: 233 10*3/uL (ref 150–400)
RDW: 13.5 % (ref 11.5–15.5)
WBC: 7.5 10*3/uL (ref 4.0–10.5)

## 2012-09-30 LAB — HEPATIC FUNCTION PANEL
AST: 25 U/L (ref 0–37)
Bilirubin, Direct: 0.2 mg/dL (ref 0.0–0.3)
Indirect Bilirubin: 0.4 mg/dL (ref 0.3–0.9)
Total Protein: 6.4 g/dL (ref 6.0–8.3)

## 2012-09-30 LAB — BASIC METABOLIC PANEL
CO2: 26 mEq/L (ref 19–32)
Chloride: 99 mEq/L (ref 96–112)
Creatinine, Ser: 1.21 mg/dL (ref 0.50–1.35)
GFR calc Af Amer: 63 mL/min — ABNORMAL LOW (ref >90)
Sodium: 136 mEq/L (ref 135–145)

## 2012-09-30 LAB — MRSA PCR SCREENING: MRSA by PCR: NEGATIVE

## 2012-09-30 MED ORDER — ALBUTEROL SULFATE 2 MG PO TABS
4.0000 mg | ORAL_TABLET | Freq: Three times a day (TID) | ORAL | Status: DC
  Administered 2012-09-30 – 2012-10-01 (×6): 4 mg via ORAL
  Filled 2012-09-30 (×4): qty 2
  Filled 2012-09-30: qty 1
  Filled 2012-09-30 (×3): qty 2
  Filled 2012-09-30 (×3): qty 1
  Filled 2012-09-30: qty 2
  Filled 2012-09-30 (×3): qty 1
  Filled 2012-09-30: qty 2

## 2012-09-30 MED ORDER — CARVEDILOL 3.125 MG PO TABS
3.1250 mg | ORAL_TABLET | Freq: Two times a day (BID) | ORAL | Status: DC
  Administered 2012-09-30 – 2012-10-02 (×4): 3.125 mg via ORAL
  Filled 2012-09-30 (×7): qty 1

## 2012-09-30 MED ORDER — ASPIRIN 81 MG PO CHEW
324.0000 mg | CHEWABLE_TABLET | Freq: Once | ORAL | Status: AC
  Administered 2012-09-30: 324 mg via ORAL
  Filled 2012-09-30: qty 4

## 2012-09-30 MED ORDER — INSULIN GLARGINE 100 UNIT/ML ~~LOC~~ SOLN
SUBCUTANEOUS | Status: AC
  Filled 2012-09-30: qty 10

## 2012-09-30 MED ORDER — HEPARIN BOLUS VIA INFUSION
4000.0000 [IU] | Freq: Once | INTRAVENOUS | Status: AC
  Administered 2012-09-30: 4000 [IU] via INTRAVENOUS

## 2012-09-30 MED ORDER — ONDANSETRON HCL 4 MG PO TABS: 4.0000 mg | ORAL_TABLET | Freq: Four times a day (QID) | ORAL | Status: DC | PRN

## 2012-09-30 MED ORDER — CYANOCOBALAMIN 1000 MCG/ML IJ SOLN: 1000.0000 ug | Freq: Every day | INTRAMUSCULAR | Status: DC

## 2012-09-30 MED ORDER — CYANOCOBALAMIN 1000 MCG/ML IJ SOLN: 1000.0000 ug | INTRAMUSCULAR | Status: DC

## 2012-09-30 MED ORDER — SODIUM CHLORIDE 0.9 % IJ SOLN: 3.0000 mL | Freq: Two times a day (BID) | INTRAMUSCULAR | Status: DC

## 2012-09-30 MED ORDER — ALPRAZOLAM 1 MG PO TABS
1.0000 mg | ORAL_TABLET | Freq: Every evening | ORAL | Status: DC | PRN
  Administered 2012-10-01: 1 mg via ORAL
  Filled 2012-09-30: qty 1

## 2012-09-30 MED ORDER — SODIUM CHLORIDE 0.9 % IV BOLUS (SEPSIS)
1000.0000 mL | Freq: Once | INTRAVENOUS | Status: AC
  Administered 2012-09-30: 1000 mL via INTRAVENOUS

## 2012-09-30 MED ORDER — ASPIRIN EC 81 MG PO TBEC
81.0000 mg | DELAYED_RELEASE_TABLET | Freq: Every day | ORAL | Status: DC
  Administered 2012-09-30 – 2012-10-02 (×3): 81 mg via ORAL
  Filled 2012-09-30 (×5): qty 1

## 2012-09-30 MED ORDER — INSULIN GLARGINE 100 UNIT/ML ~~LOC~~ SOLN
55.0000 [IU] | Freq: Every day | SUBCUTANEOUS | Status: DC
  Administered 2012-09-30 – 2012-10-01 (×2): 55 [IU] via SUBCUTANEOUS
  Filled 2012-09-30 (×5): qty 0.55

## 2012-09-30 MED ORDER — ONDANSETRON HCL 4 MG/2ML IJ SOLN: 4.0000 mg | Freq: Four times a day (QID) | INTRAMUSCULAR | Status: DC | PRN

## 2012-09-30 MED ORDER — INSULIN ASPART 100 UNIT/ML ~~LOC~~ SOLN
0.0000 [IU] | Freq: Every day | SUBCUTANEOUS | Status: DC
Start: 2012-09-30 — End: 2012-10-02

## 2012-09-30 MED ORDER — INSULIN ASPART 100 UNIT/ML ~~LOC~~ SOLN
0.0000 [IU] | Freq: Three times a day (TID) | SUBCUTANEOUS | Status: DC
  Administered 2012-09-30: 11 [IU] via SUBCUTANEOUS
  Administered 2012-09-30: 15 [IU] via SUBCUTANEOUS
  Administered 2012-09-30: 11 [IU] via SUBCUTANEOUS
  Administered 2012-10-01: 3 [IU] via SUBCUTANEOUS
  Administered 2012-10-01 (×2): 4 [IU] via SUBCUTANEOUS
  Administered 2012-10-02: 3 [IU] via SUBCUTANEOUS

## 2012-09-30 MED ORDER — HEPARIN (PORCINE) IN NACL 100-0.45 UNIT/ML-% IJ SOLN
1100.0000 [IU]/h | INTRAMUSCULAR | Status: DC
  Administered 2012-09-30: 1200 [IU]/h via INTRAVENOUS
  Administered 2012-10-01 – 2012-10-02 (×2): 1100 [IU]/h via INTRAVENOUS
  Filled 2012-09-30 (×3): qty 250

## 2012-09-30 MED ORDER — DILTIAZEM HCL ER COATED BEADS 240 MG PO CP24
240.0000 mg | ORAL_CAPSULE | Freq: Every morning | ORAL | Status: DC
  Administered 2012-09-30 – 2012-10-02 (×3): 240 mg via ORAL
  Filled 2012-09-30 (×5): qty 1

## 2012-09-30 MED ORDER — ATORVASTATIN CALCIUM 20 MG PO TABS
20.0000 mg | ORAL_TABLET | Freq: Every day | ORAL | Status: DC
  Administered 2012-09-30 – 2012-10-01 (×2): 20 mg via ORAL
  Filled 2012-09-30 (×2): qty 1

## 2012-09-30 MED ORDER — OMEGA-3-ACID ETHYL ESTERS 1 G PO CAPS
1.0000 g | ORAL_CAPSULE | Freq: Every day | ORAL | Status: DC
  Administered 2012-09-30 – 2012-10-02 (×3): 1 g via ORAL
  Filled 2012-09-30 (×5): qty 1

## 2012-09-30 MED ORDER — SIMVASTATIN 20 MG PO TABS
40.0000 mg | ORAL_TABLET | Freq: Every day | ORAL | Status: DC
  Filled 2012-09-30: qty 1

## 2012-09-30 MED ORDER — MORPHINE SULFATE 2 MG/ML IJ SOLN
2.0000 mg | INTRAMUSCULAR | Status: DC | PRN
  Administered 2012-10-01: 2 mg via INTRAVENOUS
  Filled 2012-09-30: qty 1

## 2012-09-30 NOTE — Progress Notes (Signed)
Cardiology called and made aware of consult. 

## 2012-09-30 NOTE — Progress Notes (Signed)
UR chart review completed.  

## 2012-09-30 NOTE — Progress Notes (Signed)
ANTICOAGULATION CONSULT NOTE - Initial Consult  Pharmacy Consult for Heparin Indication: chest pain/ACS  No Known Allergies  Patient Measurements: Height: 5\' 8"  (172.7 cm) Weight: 198 lb (89.812 kg) IBW/kg (Calculated) : 68.4 Heparin Dosing Weight: 88.9 kg  Vital Signs: Temp: 98.1 F (36.7 C) (08/27 0231) Temp src: Oral (08/27 0231) BP: 109/64 mmHg (08/27 0509) Pulse Rate: 67 (08/27 0509)  Labs:  Recent Labs  09/29/12 0005 09/30/12 0252  HGB 14.2 13.4  HCT 42.9 40.3  PLT 269 233  CREATININE 1.23 1.21  TROPONINI  --  2.45*   Estimated Creatinine Clearance: 51.3 ml/min (by C-G formula based on Cr of 1.21).  Medical History: Past Medical History  Diagnosis Date  . Diabetes mellitus   . Anginal pain   . Hyperlipemia   . Cancer   . Lung cancer   . Prostate cancer    Medications:  Scheduled:  . albuterol  4 mg Oral TID  . aspirin EC  81 mg Oral Daily  . carvedilol  3.125 mg Oral BID WC  . cyanocobalamin  1,000 mcg Intramuscular Daily  . diltiazem  240 mg Oral q morning - 10a  . insulin aspart  0-20 Units Subcutaneous TID WC  . insulin aspart  0-5 Units Subcutaneous QHS  . insulin glargine  55 Units Subcutaneous QHS  . omega-3 acid ethyl esters  1 g Oral Daily  . simvastatin  40 mg Oral q1800  . sodium chloride  3 mL Intravenous Q12H  . sodium chloride  3 mL Intravenous Q12H    Assessment: Okay for Protocol 79 y.o. male with 2 days history of intermittent epigastric pain found to have a significantly elevated troponin level, consistent with myocardial infarction. Baseline coag labs not completed at this time.  Goal of Therapy:  Heparin level 0.3-0.7 units/ml Monitor platelets by anticoagulation protocol: Yes   Plan:  Give 4000 units bolus x 1 Start heparin infusion at 1200 units/hr Check anti-Xa level in 6-8 hours and daily while on heparin Continue to monitor H&H and platelets  Lamonte Richer R 09/30/2012,5:10 AM

## 2012-09-30 NOTE — ED Notes (Signed)
POTC occult blood, negative. EDP notified.

## 2012-09-30 NOTE — Progress Notes (Signed)
CRITICAL VALUE ALERT  Critical value received:  Troponin = 2.25  Date of notification:  09/30/2012  Time of notification:  1140  Critical value read back:yes  Nurse who received alert:  Alto Denver, RN  MD notified (1st page):  Dr Kerry Hough  Time of first page:  1142  MD notified (2nd page):  Time of second page:  Responding MD:  Dr Kerry Hough  Time MD responded:  1142   No new order received at this time. No complaints of any chest pain at this time. Will continue to monitor.

## 2012-09-30 NOTE — ED Notes (Addendum)
CRITICAL VALUE ALERT  Critical value received:  Troponin 2.89  Date of notification:  09/30/12  Time of notification:  0630  Critical value read back:yes  Nurse who received alert:  Thornton Dales, RN  MD notified (1st page):  Karilyn Cota  Time of first page:  [phone]  MD notified (2nd page): Karilyn Cota  Time of second RUEA:[id number]  Responding MD: Karilyn Cota  Time MD responded:  [phone]

## 2012-09-30 NOTE — ED Notes (Signed)
CRITICAL VALUE ALERT  Critical value received: Troponin 2.45  Date of notification:  09/30/12  Time of notification:  0341  Critical value read back:yes  Nurse who received alert:  Thornton Dales, RN  MD notified (1st page):  [place]  Time of first page:  [phone]  MD notified (2nd page):  Time of second page:  Responding MD:  Bebe Shaggy  Time MD responded:  [phone]

## 2012-09-30 NOTE — H&P (Signed)
[facility] History and Physical  Thomas [name]:[id number] DOB: [birth year] DOA: 09/30/2012  Referring physician: Dr. Bebe Shaggy, ER PCP: [name], MD    Chief Complaint: Epigastric pain.  HPI: Thomas [name] is a 79 y.o. male who possibly has underlying dementia, presents with 2 days history of intermittent epigastric pain. He came today because of chest and then epigastric pain. He cannot give a good description, I suspect because of his confusion/underlying dementia. He is currently pain-free. When he was evaluated in the emergency room, he was found to have a significantly elevated troponin level, consistent with myocardial infarction. The ER physician has spoken with cardiology, who feels that they would manage him conservatively and therefore the patient will be admitted [facility]. He denies any dyspnea, palpitations. There is no vomiting. The patient is diabetic.   Review of Systems:  Apart from history of present illness, other systems negative.  Past Medical History  Diagnosis Date  . Diabetes mellitus   . Anginal pain   . Hyperlipemia   . Cancer   . Lung cancer   . Prostate cancer    Past Surgical History  Procedure Laterality Date  . Back surgery    . Tumor removal      from lung  . Prostate surgery    . Cardiac surgery    . Surgery to repair aneurysm     Social History:  reports that he quit smoking about 18 years ago. His smoking use included Cigarettes. He has a 50 pack-year smoking history. He uses smokeless tobacco. He reports that he does not drink alcohol or use illicit drugs.   No Known Allergies  No family history on file. noncontributory.   Prior to Admission medications   Medication Sig Start Date End Date Taking? Authorizing Provider  albuterol (PROVENTIL) 4 MG tablet Take 4 mg by mouth 3 (three) times daily.   Yes Historical Provider, MD  ALPRAZolam Prudy Feeler) 1 MG tablet Take 1 tablet (1 mg total) by mouth at bedtime as  needed. For sleep 09/14/12  Yes Tiffany L Reed, DO  cyanocobalamin (,VITAMIN B-12,) 1000 MCG/ML injection Inject 1 mL (1,000 mcg total) into the muscle daily. Last dose 8/12. Then once a week x4. Then once a month. 09/10/12  Yes Standley Brooking, MD  diltiazem (CARDIZEM CD) 240 MG 24 hr capsule Take 1 capsule (240 mg total) by mouth every morning. 05/12/12  Yes Gaylord Shih, MD  insulin glargine (LANTUS) 100 UNIT/ML injection Inject 0.55 mLs (55 Units total) into the skin at bedtime. 09/10/12  Yes Standley Brooking, MD  Omega-3 Fatty Acids (FISH OIL) 1200 MG CAPS Take 1 capsule by mouth 2 (two) times daily.    Yes Historical Provider, MD  pravastatin (PRAVACHOL) 80 MG tablet Take 1 tablet (80 mg total) by mouth every evening. 03/25/12  Yes Gaylord Shih, MD   Physical Exam: Filed Vitals:   09/30/12 0300  BP: 112/47  Pulse:   Temp:   Resp:      General:  He looks systemically well. He does not appear to be in pain.  Eyes: No pallor. No jaundice.  ENT: Unremarkable.  Neck: No lymphadenopathy.  Cardiovascular: Heart sounds are present without gallop rhythm. There are no murmurs.  Respiratory: Lung fields are clear.  Abdomen: Soft, nontender. No hepatosplenomegaly.  Skin: No rash.  Musculoskeletal: No acute joint abnormalities.  Psychiatric: Appropriate affect.  Neurologic: Appears to be alert and orientated in the emergency room. No focal neurological  signs.  Labs on Admission:  Basic Metabolic Panel:  Recent Labs Lab 09/29/12 0005 09/30/12 0252  NA 138 136  K 4.1 3.8  CL 101 99  CO2 27 26  GLUCOSE 420* 331*  BUN 17 17  CREATININE 1.23 1.21  CALCIUM 10.3 9.9   Liver Function Tests:  Recent Labs Lab 09/30/12 0252  AST 25  ALT 26  ALKPHOS 67  BILITOT 0.6  PROT 6.4  ALBUMIN 3.2*    Recent Labs Lab 09/30/12 0252  LIPASE 60*    CBC:  Recent Labs Lab 09/29/12 0005 09/30/12 0252  WBC 6.0 7.5  NEUTROABS 3.6 5.2  HGB 14.2 13.4  HCT 42.9 40.3  MCV 96.2  94.4  PLT 269 233   Cardiac Enzymes:  Recent Labs Lab 09/30/12 0252  TROPONINI 2.45*     CBG:  Recent Labs Lab 09/29/12 0109  GLUCAP 368*    Radiological Exams on Admission: Dg Chest Portable 1 View  09/30/2012   *RADIOLOGY REPORT*  Clinical Data: Chest pain  PORTABLE CHEST - 1 VIEW  Comparison: 09/07/2012  Findings: Chronic right lung base pleural parenchymal scarring/thickening. Trace right effusion not excluded.  Mild increased associated airspace opacity.  Left lung predominately clear.  Status post median sternotomy and CABG.  Sequelae of prior right rib fracture.  No pneumothorax.  IMPRESSION: Chronic right lung base pleural parenchymal scarring.  Mild superimposed opacity may reflect atelectasis or infiltrate.   Original Report Authenticated By: Jearld Lesch, M.D.    EKG: Independently reviewed. Normal sinus rhythm, right bundle branch block. No acute ST-T wave changes.  Assessment/Plan   1. Epigastric pain caused by non-ST elevation myocardial infarction. 2. Type 2 diabetes mellitus. 3. Possible underlying dementia.  Plan: 1. Admit to step down unit. 2. Intravenous heparin. 3. Beta blocker. 4. Serial cardiac enzymes. 5. Echocardiogram. 6. Cardiology consultation.  Further recommendations will depend on patient's hospital progress.   Code Status: Full code.  Family Communication: Discussed plan with patient at the bedside.   Disposition Plan: Home when medically stable.   Time spent: 60 minutes.  Wilson Singer [facility] Pager [phone].  If 7PM-7AM, please contact night-coverage [email or web address] Password [facility] 09/30/2012, 4:44 AM

## 2012-09-30 NOTE — Progress Notes (Signed)
ANTICOAGULATION CONSULT NOTE  Pharmacy Consult for Heparin Indication: chest pain/ACS  No Known Allergies  Patient Measurements: Height: 5\' 11"  (180.3 cm) Weight: 190 lb 14.7 oz (86.6 kg) IBW/kg (Calculated) : 75.3 Heparin Dosing Weight: 88.9 kg  Vital Signs: Temp: 98.1 F (36.7 C) (08/27 2000) Temp src: Oral (08/27 2000) BP: 131/66 mmHg (08/27 2000)  Labs:  Recent Labs  09/29/12 0005  09/30/12 0252 09/30/12 0504 09/30/12 1043 09/30/12 1157 09/30/12 1622 09/30/12 2050  HGB 14.2  --  13.4 12.7*  --   --   --   --   HCT 42.9  --  40.3 37.8*  --   --   --   --   PLT 269  --  233 236  --   --   --   --   LABPROT  --   --   --  21.1*  --   --   --   --   INR  --   --   --  1.89*  --   --   --   --   HEPARINUNFRC  --   --   --   --   --  0.72*  --  0.63  CREATININE 1.23  --  1.21 1.21  --   --   --   --   TROPONINI  --   < > 2.45* 2.89* 2.25*  --  1.45*  --   < > = values in this interval not displayed. Estimated Creatinine Clearance: 50.1 ml/min (by C-G formula based on Cr of 1.21).  Medical History: Past Medical History  Diagnosis Date  . Diabetes mellitus   . Anginal pain   . Hyperlipemia   . Cancer   . Lung cancer   . Prostate cancer    Medications:  Scheduled:  . albuterol  4 mg Oral TID  . aspirin EC  81 mg Oral Daily  . atorvastatin  20 mg Oral q1800  . carvedilol  3.125 mg Oral BID WC  . [START ON 10/30/2012] cyanocobalamin  1,000 mcg Intramuscular Q30 days  . diltiazem  240 mg Oral q morning - 10a  . insulin aspart  0-20 Units Subcutaneous TID WC  . insulin aspart  0-5 Units Subcutaneous QHS  . insulin glargine  55 Units Subcutaneous QHS  . omega-3 acid ethyl esters  1 g Oral Daily  . sodium chloride  3 mL Intravenous Q12H  . sodium chloride  3 mL Intravenous Q12H    Assessment: 79 y.o. male with NSTEMI on heparin x48hrs for medical mgmt of event.   Initial heparin level at upper end of goal range.  No bleeding noted.   Goal of Therapy:   Heparin level 0.3-0.7 units/ml Monitor platelets by anticoagulation protocol: Yes   Plan:  1) Decrease heparin infusion to 1100units/hr 2) Recheck 8hr heparin level to verify 3) Daily heparin level & CBC while on heparin  Mavis Gravelle, [facility] 09/30/2012,9:40 PM  Repeat heparin level in goal range.   Junita Push, PharmD, BCPS

## 2012-09-30 NOTE — ED Notes (Signed)
Paged pharmacy

## 2012-09-30 NOTE — Consult Note (Signed)
Reason for Consult:chest pain Referring Physician: PTH  CHER [name] is an 79 y.o. male.  HPI: This is an 79 yr old male patient of Thomas [name] who was admitted with epigastric pain and vomiting and positive troponins. EKG shows chronic RBBB. He has significant dementia. He has history of CAD S/P CABG 1995 with stenting circumflex 2004, preserved LV function, patent LIMA to LAD, and SVG to RCA. 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  Patient denies chest pain, shortness of breath, dizziness or presyncope. Says he had pinpoint epigastric pain while lying in bed and then vomited brown liquid. He has dementia and doesn't recall that he has heart problems but knows Thomas [name] is his cardiologist.   Past Medical History  Diagnosis Date  . Diabetes mellitus   . Anginal pain   . Hyperlipemia   . Cancer   . Lung cancer   . Prostate cancer     Past Surgical History  Procedure Laterality Date  . Back surgery    . Tumor removal      from lung  . Prostate surgery    . Cardiac surgery    . Surgery to repair aneurysm      No family history on file.  Social History:  reports that he quit smoking about 18 years ago. His smoking use included Cigarettes. He has a 50 pack-year smoking history. He uses smokeless tobacco. He reports that he does not drink alcohol or use illicit drugs.  Allergies: No Known Allergies  Medications:  Scheduled Meds: . albuterol  4 mg Oral TID  . aspirin EC  81 mg Oral Daily  . carvedilol  3.125 mg Oral BID WC  . cyanocobalamin  1,000 mcg Intramuscular Daily  . diltiazem  240 mg Oral q morning - 10a  . insulin aspart  0-20 Units Subcutaneous TID WC  . insulin aspart  0-5 Units Subcutaneous QHS  . insulin glargine  55 Units Subcutaneous QHS  . omega-3 acid ethyl esters  1 g Oral Daily  . simvastatin  40 mg Oral q1800  . sodium chloride  3 mL Intravenous Q12H  . sodium chloride  3 mL Intravenous Q12H   Continuous  Infusions: . heparin 1,200 Units/hr (09/30/12 0532)   PRN Meds:.ALPRAZolam, morphine injection, ondansetron (ZOFRAN) IV, ondansetron   Results for orders placed during the hospital encounter of 09/30/12 (from the past 48 hour(s))  CBC WITH DIFFERENTIAL     Status: None   Collection Time    09/30/12  2:52 AM      Result Value Range   WBC 7.5  4.0 - 10.5 K/uL   RBC 4.27  4.22 - 5.81 MIL/uL   Hemoglobin 13.4  13.0 - 17.0 g/dL   HCT 40.9  81.1 - 91.4 %   MCV 94.4  78.0 - 100.0 fL   MCH 31.4  26.0 - 34.0 pg   MCHC 33.3  30.0 - 36.0 g/dL   RDW 78.2  95.6 - 21.3 %   Platelets 233  150 - 400 K/uL   Neutrophils Relative % 69  43 - 77 %   Neutro Abs 5.2  1.7 - 7.7 K/uL   Lymphocytes Relative 19  12 - 46 %   Lymphs Abs 1.4  0.7 - 4.0 K/uL   Monocytes Relative 10  3 - 12 %   Monocytes Absolute 0.7  0.1 - 1.0 K/uL   Eosinophils Relative 1  0 - 5 %  Eosinophils Absolute 0.1  0.0 - 0.7 K/uL   Basophils Relative 1  0 - 1 %   Basophils Absolute 0.0  0.0 - 0.1 K/uL  BASIC METABOLIC PANEL     Status: Abnormal   Collection Time    09/30/12  2:52 AM      Result Value Range   Sodium 136  135 - 145 mEq/L   Potassium 3.8  3.5 - 5.1 mEq/L   Chloride 99  96 - 112 mEq/L   CO2 26  19 - 32 mEq/L   Glucose, Bld 331 (*) 70 - 99 mg/dL   BUN 17  6 - 23 mg/dL   Creatinine, Ser 1.61  0.50 - 1.35 mg/dL   Calcium 9.9  8.4 - 09.6 mg/dL   GFR calc non Af Amer 54 (*) >90 mL/min   GFR calc Af Amer 63 (*) >90 mL/min   Comment: (NOTE)     The eGFR has been calculated using the CKD EPI equation.     This calculation has not been validated in all clinical situations.     eGFR's persistently <90 mL/min signify possible Chronic Kidney     Disease.  TROPONIN I     Status: Abnormal   Collection Time    09/30/12  2:52 AM      Result Value Range   Troponin I 2.45 (*) <0.30 ng/mL   Comment:            Due to the release kinetics of cTnI,     a negative result within the first hours     of the onset of symptoms  does not rule out     myocardial infarction with certainty.     If myocardial infarction is still suspected,     repeat the test at appropriate intervals.     RESULT REPEATED AND VERIFIED     CRITICAL RESULT CALLED TO, READ BACK BY AND VERIFIED WITH:     HAYMORE R AT 0340 ON [id number] BY FORSYTH K  HEPATIC FUNCTION PANEL     Status: Abnormal   Collection Time    09/30/12  2:52 AM      Result Value Range   Total Protein 6.4  6.0 - 8.3 g/dL   Albumin 3.2 (*) 3.5 - 5.2 g/dL   AST 25  0 - 37 U/L   ALT 26  0 - 53 U/L   Alkaline Phosphatase 67  39 - 117 U/L   Total Bilirubin 0.6  0.3 - 1.2 mg/dL   Bilirubin, Direct 0.2  0.0 - 0.3 mg/dL   Indirect Bilirubin 0.4  0.3 - 0.9 mg/dL  LIPASE, BLOOD     Status: Abnormal   Collection Time    09/30/12  2:52 AM      Result Value Range   Lipase 60 (*) 11 - 59 U/L  COMPREHENSIVE METABOLIC PANEL     Status: Abnormal   Collection Time    09/30/12  5:04 AM      Result Value Range   Sodium 138  135 - 145 mEq/L   Potassium 3.9  3.5 - 5.1 mEq/L   Chloride 103  96 - 112 mEq/L   CO2 26  19 - 32 mEq/L   Glucose, Bld 272 (*) 70 - 99 mg/dL   BUN 16  6 - 23 mg/dL   Creatinine, Ser 8.11  0.50 - 1.35 mg/dL   Calcium 9.3  8.4 - 91.4 mg/dL   Total Protein 5.7 (*) 6.0 -  8.3 g/dL   Albumin 2.9 (*) 3.5 - 5.2 g/dL   AST 22  0 - 37 U/L   ALT 24  0 - 53 U/L   Alkaline Phosphatase 60  39 - 117 U/L   Total Bilirubin 0.5  0.3 - 1.2 mg/dL   GFR calc non Af Amer 54 (*) >90 mL/min   GFR calc Af Amer 63 (*) >90 mL/min   Comment: (NOTE)     The eGFR has been calculated using the CKD EPI equation.     This calculation has not been validated in all clinical situations.     eGFR's persistently <90 mL/min signify possible Chronic Kidney     Disease.  CBC     Status: Abnormal   Collection Time    09/30/12  5:04 AM      Result Value Range   WBC 6.4  4.0 - 10.5 K/uL   RBC 3.99 (*) 4.22 - 5.81 MIL/uL   Hemoglobin 12.7 (*) 13.0 - 17.0 g/dL   HCT 16.1 (*) 09.6 - 04.5 %    MCV 94.7  78.0 - 100.0 fL   MCH 31.8  26.0 - 34.0 pg   MCHC 33.6  30.0 - 36.0 g/dL   RDW 40.9  81.1 - 91.4 %   Platelets 236  150 - 400 K/uL  PROTIME-INR     Status: Abnormal   Collection Time    09/30/12  5:04 AM      Result Value Range   Prothrombin Time 21.1 (*) 11.6 - 15.2 seconds   INR 1.89 (*) 0.00 - 1.49  TROPONIN I     Status: Abnormal   Collection Time    09/30/12  5:04 AM      Result Value Range   Troponin I 2.89 (*) <0.30 ng/mL   Comment: CRITICAL RESULT CALLED TO, READ BACK BY AND VERIFIED WITH:     HAYMORE,R AT 6:30AM ON 09/30/12 BY FESTERMAN,C                Due to the release kinetics of cTnI,     a negative result within the first hours     of the onset of symptoms does not rule out     myocardial infarction with certainty.     If myocardial infarction is still suspected,     repeat the test at appropriate intervals.  GLUCOSE, CAPILLARY     Status: Abnormal   Collection Time    09/30/12  7:23 AM      Result Value Range   Glucose-Capillary 288 (*) 70 - 99 mg/dL   Comment 1 Documented in Chart     Comment 2 Notify RN      Dg Chest Portable 1 View  09/30/2012   *RADIOLOGY REPORT*  Clinical Data: Chest pain  PORTABLE CHEST - 1 VIEW  Comparison: 09/07/2012  Findings: Chronic right lung base pleural parenchymal scarring/thickening. Trace right effusion not excluded.  Mild increased associated airspace opacity.  Left lung predominately clear.  Status post median sternotomy and CABG.  Sequelae of prior right rib fracture.  No pneumothorax.  IMPRESSION: Chronic right lung base pleural parenchymal scarring.  Mild superimposed opacity may reflect atelectasis or infiltrate.   Original Report Authenticated By: Jearld Lesch, M.D.    ROS See HPI Patient has dementia so difficult to evaluate.  Allergic/Immunologic: Negative for environmental allergies.  Blood pressure 103/50, pulse 69, temperature 98.1 F (36.7 C), temperature source Oral, resp. rate 16, height 5\' 11"   (1.803  m), weight 190 lb 14.7 oz (86.6 kg), SpO2 98.00%. Physical Exam PHYSICAL EXAM: Well-nournished, in no acute distress. Neck: No JVD, HJR, Bruit, or thyroid enlargement Lungs: No tachypnea, clear without wheezing, rales, or rhonchi Cardiovascular: RRR, PMI not displaced, heart sounds normal, no murmurs, gallops, bruit, thrill, or heave. Abdomen: BS normal. Soft without organomegaly, masses, lesions or tenderness. Extremities: without cyanosis, clubbing or edema. Good distal pulses bilateral SKin: Warm, no lesions or rashes  Musculoskeletal: No deformities Neuro: no focal signs    Assessment/Plan: 1.NSTEMI: epigastric pain with postive Troponin 2.45 and 2.89. EKG NSR with RBBB. Check 2Decho today. Given dementia, age, and pain free could evaluate later with Lexiscan for ischemia. Await echo results.On IV heparin, beta blocker, aspirin, diltiazem. 2. CAD S/P CABG 1995, stent Cfx 2004, negative myoview 2011 3. COPD & squamous cell lung CA 2001 4. HTN 5. Dyslipidemia 6. Dementia  Jacolyn Reedy 09/30/2012, 9:21 AM

## 2012-09-30 NOTE — ED Provider Notes (Signed)
CSN: [id number]     Arrival date & time 09/30/12  0224 History   First MD Initiated Contact with Patient 09/30/12 0235     Chief Complaint  Patient presents with  . Chest Pain  . Abdominal Pain    Patient is a 79 y.o. male presenting with abdominal pain. The history is provided by the patient.  Abdominal Pain Pain location:  Epigastric Pain quality: aching   Pain radiates to:  Does not radiate Pain severity:  Moderate Onset quality:  Gradual Duration:  2 days Progression:  Improving Relieved by:  Vomiting Associated symptoms: vomiting   Associated symptoms: no chest pain, no dysuria, no fever, no hematochezia and no shortness of breath   pt reports that he has had epigastric pain for "few days" and reports tonight it worsened and he vomited once and now it is improved.  He reports the vomitus was "black" but no blood reported and no melena/rectal bleeding reported recently Denies active CP/SOB.  EMS was called tonight for CP but on their arrival he was reporting epigastric pain  Past Medical History  Diagnosis Date  . Diabetes mellitus   . Anginal pain   . Hyperlipemia   . Cancer   . Lung cancer   . Prostate cancer    Past Surgical History  Procedure Laterality Date  . Back surgery    . Tumor removal      from lung  . Prostate surgery    . Cardiac surgery    . Surgery to repair aneurysm     No family history on file. History  Substance Use Topics  . Smoking status: Former Smoker -- 1.00 packs/day for 50 years    Types: Cigarettes    Quit date: 12/10/1993  . Smokeless tobacco: Current User  . Alcohol Use: No    Review of Systems  Constitutional: Negative for fever.  Respiratory: Negative for shortness of breath.   Cardiovascular: Negative for chest pain.  Gastrointestinal: Positive for vomiting and abdominal pain. Negative for blood in stool and hematochezia.  Genitourinary: Negative for dysuria.  Musculoskeletal: Negative for back pain.  Neurological:  Negative for weakness.  All other systems reviewed and are negative.    Allergies  Review of patient's allergies indicates no known allergies.  Home Medications   Current Outpatient Rx  Name  Route  Sig  Dispense  Refill  . albuterol (PROVENTIL) 4 MG tablet   Oral   Take 4 mg by mouth 3 (three) times daily.         Marland Kitchen ALPRAZolam (XANAX) 1 MG tablet   Oral   Take 1 tablet (1 mg total) by mouth at bedtime as needed. For sleep   30 tablet   5   . cyanocobalamin (,VITAMIN B-12,) 1000 MCG/ML injection   Intramuscular   Inject 1 mL (1,000 mcg total) into the muscle daily. Last dose 8/12. Then once a week x4. Then once a month.         . diltiazem (CARDIZEM CD) 240 MG 24 hr capsule   Oral   Take 1 capsule (240 mg total) by mouth every morning.   90 capsule   3   . insulin glargine (LANTUS) 100 UNIT/ML injection   Subcutaneous   Inject 0.55 mLs (55 Units total) into the skin at bedtime.         . Omega-3 Fatty Acids (FISH OIL) 1200 MG CAPS   Oral   Take 1 capsule by mouth 2 (two) times daily.          Marland Kitchen  pravastatin (PRAVACHOL) 80 MG tablet   Oral   Take 1 tablet (80 mg total) by mouth every evening.   30 tablet   11    BP 127/95  Pulse 77  Temp(Src) 98.1 F (36.7 C) (Oral)  Resp 18  Ht 5\' 8"  (1.727 m)  Wt 198 lb (89.812 kg)  BMI 30.11 kg/m2  SpO2 97% Physical Exam CONSTITUTIONAL: Well developed/well nourished HEAD: Normocephalic/atraumatic EYES: EOMI/PERRL ENMT: Mucous membranes moist NECK: supple no meningeal signs SPINE:entire spine nontender CV: S1/S2 noted, no murmurs/rubs/gallops noted LUNGS: Lungs are clear to auscultation bilaterally, no apparent distress ABDOMEN: soft, nontender, no rebound or guarding, +BS noted in all quadrants GU:no cva tenderness, no hernia or scrotal tenderness noted.  Chaperone present Rectal - stool color yellow, no blood or melena noted.  Chaperone present NEURO: Pt is awake/alert, moves all  extremitiesx4 EXTREMITIES: pulses normal, full ROM SKIN: warm, color normal PSYCH: no abnormalities of mood noted  ED Course  Procedures Labs Review Labs Reviewed  CBC WITH DIFFERENTIAL  BASIC METABOLIC PANEL  TROPONIN I  HEPATIC FUNCTION PANEL  LIPASE, BLOOD   Imaging Review No results found. 3:59 AM Pt with elevated troponin.  However, he currently denies complaints.  His abdomen is soft without focal tenderness.  He denies CP.  EKG does not reveal STEMI He does have CAD (s/p CABG) However his history is limited due to his suspected dementia Will consult hospitalist 4:25 AM I also spoke to dr balfour with [facility] cardiology Given that pain is resolved (and patient denied CP to me) and given history/exam/comorbidities, medical management at [facility] is appropriate at this time I spoke to dr Karilyn Cota, will admit to hospital  MDM  No diagnosis found. Nursing notes including past medical history and social history reviewed and considered in documentation xrays reviewed and considered Labs/vital reviewed and considered Previous records reviewed and considered - previous admission records reviewed    Date: 09/30/2012  Rate: 76  Rhythm: normal sinus rhythm  QRS Axis: left  Intervals: normal  ST/T Wave abnormalities: nonspecific ST changes  Conduction Disutrbances:right bundle branch block  Narrative Interpretation:   Old EKG Reviewed: unchanged from 09/07/12    Joya Gaskins, MD 09/30/12 [phone]

## 2012-09-30 NOTE — ED Notes (Signed)
Pt arrived by EMS called out for chest pain.  EMS states more epigastric pain. Pt vomited x 1 before EMS arrived per fire department. Pt states he started feeling better after he vomited, pt denies pain at present.

## 2012-09-30 NOTE — Progress Notes (Signed)
*  PRELIMINARY RESULTS* Echocardiogram 2D Echocardiogram has been performed.  Thomas [name] 09/30/2012, 2:13 PM

## 2012-09-30 NOTE — ED Notes (Signed)
Patient given graham crackers and diet ginger ale at this time. Patient states that he feels better since he threw up at home. Patient expresses that he wants to go home at this time.

## 2012-09-30 NOTE — Progress Notes (Signed)
Patient admitted by Dr. Karilyn Cota earlier this morning  Patient seen and examined.  He has been admitted with NSTEMI.  Cardiology following.  Medical management is recommended.  He is currently on aspirin and heparin infusion.  Will continue to treat supportively.  Avalie Oconnor

## 2012-09-30 NOTE — ED Notes (Signed)
Assisted patient with the urinal 

## 2012-09-30 NOTE — Progress Notes (Signed)
ANTICOAGULATION CONSULT NOTE  Pharmacy Consult for Heparin Indication: chest pain/ACS  No Known Allergies  Patient Measurements: Height: 5\' 11"  (180.3 cm) Weight: 190 lb 14.7 oz (86.6 kg) IBW/kg (Calculated) : 75.3 Heparin Dosing Weight: 88.9 kg  Vital Signs: Temp: 97.9 F (36.6 C) (08/27 0800) Temp src: Oral (08/27 0800) BP: 104/45 mmHg (08/27 1300) Pulse Rate: 69 (08/27 0700)  Labs:  Recent Labs  09/29/12 0005 09/30/12 0252 09/30/12 0504 09/30/12 1043 09/30/12 1157  HGB 14.2 13.4 12.7*  --   --   HCT 42.9 40.3 37.8*  --   --   PLT 269 233 236  --   --   LABPROT  --   --  21.1*  --   --   INR  --   --  1.89*  --   --   HEPARINUNFRC  --   --   --   --  0.72*  CREATININE 1.23 1.21 1.21  --   --   TROPONINI  --  2.45* 2.89* 2.25*  --    Estimated Creatinine Clearance: 50.1 ml/min (by C-G formula based on Cr of 1.21).  Medical History: Past Medical History  Diagnosis Date  . Diabetes mellitus   . Anginal pain   . Hyperlipemia   . Cancer   . Lung cancer   . Prostate cancer    Medications:  Scheduled:  . albuterol  4 mg Oral TID  . aspirin EC  81 mg Oral Daily  . atorvastatin  20 mg Oral q1800  . carvedilol  3.125 mg Oral BID WC  . [START ON 10/30/2012] cyanocobalamin  1,000 mcg Intramuscular Q30 days  . diltiazem  240 mg Oral q morning - 10a  . insulin aspart  0-20 Units Subcutaneous TID WC  . insulin aspart  0-5 Units Subcutaneous QHS  . insulin glargine  55 Units Subcutaneous QHS  . omega-3 acid ethyl esters  1 g Oral Daily  . sodium chloride  3 mL Intravenous Q12H  . sodium chloride  3 mL Intravenous Q12H    Assessment: 79 y.o. male with NSTEMI on heparin x48hrs for medical mgmt of event.   Initial heparin level at upper end of goal range.  No bleeding noted.   Goal of Therapy:  Heparin level 0.3-0.7 units/ml Monitor platelets by anticoagulation protocol: Yes   Plan:  1) Decrease heparin infusion to 1100units/hr 2) Recheck 8hr heparin level to  verify 3) Daily heparin level & CBC while on heparin  Thomas [name], Thomas [name] 09/30/2012,2:16 PM

## 2012-09-30 NOTE — Care Management Note (Unsigned)
    Page 1 of 1   10/01/2012     2:29:58 PM   CARE MANAGEMENT NOTE 10/01/2012  Patient:  Thomas [name], Thomas [name]   Account Number:  [id number]  Date Initiated:  09/30/2012  Documentation initiated by:  Sharrie Rothman  Subjective/Objective Assessment:   Pt admitted with NSTEMI. Pt was placed at [facility] last admission but pt is confused as to where he is from at this point. Pt thinks he is from home but is not sure.     Action/Plan:   CM called pts wife and had to leave message. Also tried to call pts sister in law June, but unable to make contact with her as well. Will keep trying.   Anticipated DC Date:  10/02/2012   Anticipated DC Plan:  HOME W HOME HEALTH SERVICES      DC Planning Services  CM consult      Choice offered to / List presented to:             Status of service:  In process, will continue to follow Medicare Important Message given?   (If response is "NO", the following Medicare IM given date fields will be blank) Date Medicare IM given:   Date Additional Medicare IM given:    Discharge Disposition:    Per UR Regulation:    If discussed at Long Length of Stay Meetings, dates discussed:    Comments:  10/01/12 1430 Arlyss Queen, RN BSN CM Pt was discharged from [facility] in [place] about 2 weeks ago after a 1 week stay. Pt is active with [facility] RN. Pt is living with wife.  09/30/12 1535 Arlyss Queen, RN BSN CM

## 2012-09-30 NOTE — Consult Note (Signed)
Patient examined chart reviewed.  Patient with advanced dementia. No symptoms this morning Would Rx medically Echo pending from today.  Contineu with ASA and beta blocker.  Can Rx with heparin for 48 hrs.  Exam remarkable for dementia and previous sternotomy.    Charlton Haws

## 2012-10-01 DIAGNOSIS — I214 Non-ST elevation (NSTEMI) myocardial infarction: Secondary | ICD-10-CM

## 2012-10-01 DIAGNOSIS — J4489 Other specified chronic obstructive pulmonary disease: Secondary | ICD-10-CM

## 2012-10-01 DIAGNOSIS — J449 Chronic obstructive pulmonary disease, unspecified: Secondary | ICD-10-CM

## 2012-10-01 LAB — CBC
MCV: 93.5 fL (ref 78.0–100.0)
Platelets: 208 10*3/uL (ref 150–400)
RBC: 4.13 MIL/uL — ABNORMAL LOW (ref 4.22–5.81)
RDW: 13.5 % (ref 11.5–15.5)
WBC: 5 10*3/uL (ref 4.0–10.5)

## 2012-10-01 LAB — GLUCOSE, CAPILLARY
Glucose-Capillary: 178 mg/dL — ABNORMAL HIGH (ref 70–99)
Glucose-Capillary: 185 mg/dL — ABNORMAL HIGH (ref 70–99)
Glucose-Capillary: 192 mg/dL — ABNORMAL HIGH (ref 70–99)

## 2012-10-01 LAB — LIPID PANEL
Total CHOL/HDL Ratio: 1.7 RATIO
VLDL: 12 mg/dL (ref 0–40)

## 2012-10-01 MED ORDER — INSULIN GLARGINE 100 UNIT/ML ~~LOC~~ SOLN
SUBCUTANEOUS | Status: AC
  Filled 2012-10-01: qty 10

## 2012-10-01 NOTE — Progress Notes (Signed)
Consulting cardiologist: Dr. Charlton Haws  Subjective:   No chest pain or breathlessness.   Objective:   Temp:  [97.5 F (36.4 C)-98.3 F (36.8 C)] 98.3 F (36.8 C) (08/28 0400) Resp:  [11-22] 18 (08/28 0500) BP: (101-138)/(45-84) 123/65 mmHg (08/28 0900) SpO2:  [94 %] 94 % (08/28 0800) Weight:  [191 lb 9.3 oz (86.9 kg)] 191 lb 9.3 oz (86.9 kg) (08/28 0500) Last BM Date: 09/30/12  Filed Weights   09/30/12 0231 09/30/12 0800 10/01/12 0500  Weight: 198 lb (89.812 kg) 190 lb 14.7 oz (86.6 kg) 191 lb 9.3 oz (86.9 kg)    Intake/Output Summary (Last 24 hours) at 10/01/12 1105 Last data filed at 10/01/12 0800  Gross per 24 hour  Intake 1119.3 ml  Output   1300 ml  Net -180.7 ml   Telemetry: Sinus rhythm.  Exam:  General: Appears comfortable.  Lungs: Course but clear  Cardiac: RRR, no gallop.  Abdomen: NABS.  Extremities: No pitting.  Lab Results:  Basic Metabolic Panel:  Recent Labs Lab 09/29/12 0005 09/30/12 0252 09/30/12 0504  NA 138 136 138  K 4.1 3.8 3.9  CL 101 99 103  CO2 27 26 26   GLUCOSE 420* 331* 272*  BUN 17 17 16   CREATININE 1.23 1.21 1.21  CALCIUM 10.3 9.9 9.3    Liver Function Tests:  Recent Labs Lab 09/30/12 0252 09/30/12 0504  AST 25 22  ALT 26 24  ALKPHOS 67 60  BILITOT 0.6 0.5  PROT 6.4 5.7*  ALBUMIN 3.2* 2.9*    CBC:  Recent Labs Lab 09/30/12 0252 09/30/12 0504 10/01/12 0444  WBC 7.5 6.4 5.0  HGB 13.4 12.7* 13.2  HCT 40.3 37.8* 38.6*  MCV 94.4 94.7 93.5  PLT 233 236 208    Cardiac Enzymes:  Recent Labs Lab 09/30/12 0504 09/30/12 1043 09/30/12 1622  TROPONINI 2.89* 2.25* 1.45*    Echocardiogram 8/27: Study Conclusions  - Left ventricle: The cavity size was normal. Wall thickness was normal. Systolic function was normal. The estimated ejection fraction was in the range of 55% to 60%. Wall motion was normal; there were no regional wall motion abnormalities. - Aortic valve: Mildly calcified annulus.  Mildly calcified leaflets. - Mitral valve: Calcified annulus. - Atrial septum: No defect or patent foramen ovale was identified.    Medications:   Scheduled Medications: . albuterol  4 mg Oral TID  . aspirin EC  81 mg Oral Daily  . atorvastatin  20 mg Oral q1800  . carvedilol  3.125 mg Oral BID WC  . [START ON 10/30/2012] cyanocobalamin  1,000 mcg Intramuscular Q30 days  . diltiazem  240 mg Oral q morning - 10a  . insulin aspart  0-20 Units Subcutaneous TID WC  . insulin aspart  0-5 Units Subcutaneous QHS  . insulin glargine  55 Units Subcutaneous QHS  . omega-3 acid ethyl esters  1 g Oral Daily  . sodium chloride  3 mL Intravenous Q12H  . sodium chloride  3 mL Intravenous Q12H     Infusions: . heparin 1,100 Units/hr (10/01/12 0030)     PRN Medications:  ALPRAZolam, morphine injection, ondansetron (ZOFRAN) IV, ondansetron   Assessment:   1. NSTEMI, currently stable with plan for medical therapy. LVEF is preserved by echocardiogram. He continues on heparin.  2. Type 2 diabetes mellitus.  3. Hyperlipidemia.  4. Dementia.   Plan/Discussion:    Continue aspirin, Coreg, Lipitor, Cardizem CD, heparin. Anticipate conservative medical management. Would have the patient ambulate to ensure  symptom stability.    Jonelle Sidle, M.D., F.A.C.C.

## 2012-10-01 NOTE — Progress Notes (Signed)
Report called to RN. Pt to be transferred via wheelchair with personal belongings.

## 2012-10-01 NOTE — Progress Notes (Signed)
[facility] PROGRESS NOTE  Thomas [name] ZOX:[id number] DOB: 03-Aug-1927 DOA: 09/30/2012 PCP: [name], MD  Assessment/Plan: 1. Non-ST elevation MI. Troponin peaked at 2.89 and has since trended down. He has not had any further chest pain. He was started on intravenous heparin and beta blockers, as well as aspirin. He is being followed by cardiology. 2-D echocardiogram done yesterday did not show any wall motion abnormalities and a normal ejection fraction. Plan is to complete a total of 48 hours of heparin infusion, which will be complete tomorrow morning. Further testing including stress test will be deferred to cardiology. Check lipid panel 2. Diabetes, type II. Blood sugar is under fair control. 3. Hypertension. Stable 4. COPD. Stable  Code Status: Full code Family Communication: Discussed with sister-in-law at the bedside Disposition Plan: Discharge home once improved   Consultants:  Cardiology  Procedures:  none  Antibiotics:  none  HPI/Subjective: No new complaints today. Feels great. Wants to go home  Objective: Filed Vitals:   10/01/12 0800  BP: 130/84  Pulse:   Temp:   Resp:     Intake/Output Summary (Last 24 hours) at 10/01/12 8119 Last data filed at 10/01/12 0800  Gross per 24 hour  Intake 1143.3 ml  Output   1300 ml  Net -156.7 ml   Filed Weights   09/30/12 0231 09/30/12 0800 10/01/12 0500  Weight: 89.812 kg (198 lb) 86.6 kg (190 lb 14.7 oz) 86.9 kg (191 lb 9.3 oz)    Exam:   General:  NAD  Cardiovascular: S1, s2, rrr  Respiratory: Clear to auscultation bilaterally  Abdomen: Soft, nontender, positive bowel sounds  Musculoskeletal: No pedal edema bilaterally   Data Reviewed: Basic Metabolic Panel:  Recent Labs Lab 09/29/12 0005 09/30/12 0252 09/30/12 0504  NA 138 136 138  K 4.1 3.8 3.9  CL 101 99 103  CO2 27 26 26   GLUCOSE 420* 331* 272*  BUN 17 17 16   CREATININE 1.23 1.21 1.21  CALCIUM 10.3 9.9 9.3   Liver  Function Tests:  Recent Labs Lab 09/30/12 0252 09/30/12 0504  AST 25 22  ALT 26 24  ALKPHOS 67 60  BILITOT 0.6 0.5  PROT 6.4 5.7*  ALBUMIN 3.2* 2.9*    Recent Labs Lab 09/30/12 0252  LIPASE 60*   No results found for this basename: AMMONIA,  in the last 168 hours CBC:  Recent Labs Lab 09/29/12 0005 09/30/12 0252 09/30/12 0504 10/01/12 0444  WBC 6.0 7.5 6.4 5.0  NEUTROABS 3.6 5.2  --   --   HGB 14.2 13.4 12.7* 13.2  HCT 42.9 40.3 37.8* 38.6*  MCV 96.2 94.4 94.7 93.5  PLT 269 233 236 208   Cardiac Enzymes:  Recent Labs Lab 09/30/12 0252 09/30/12 0504 09/30/12 1043 09/30/12 1622  TROPONINI 2.45* 2.89* 2.25* 1.45*   BNP (last 3 results) No results found for this basename: PROBNP,  in the last 8760 hours CBG:  Recent Labs Lab 09/30/12 0723 09/30/12 1123 09/30/12 1609 09/30/12 2100 10/01/12 0728  GLUCAP 288* 332* 253* 190* 179*    Recent Results (from the past 240 hour(s))  MRSA PCR SCREENING     Status: None   Collection Time    09/30/12  8:00 AM      Result Value Range Status   MRSA by PCR NEGATIVE  NEGATIVE Final   Comment:            The GeneXpert MRSA Assay (FDA     approved for NASAL specimens  only), is one component of a     comprehensive MRSA colonization     surveillance program. It is not     intended to diagnose MRSA     infection nor to guide or     monitor treatment for     MRSA infections.     Studies: Dg Chest Portable 1 View  09/30/2012   *RADIOLOGY REPORT*  Clinical Data: Chest pain  PORTABLE CHEST - 1 VIEW  Comparison: 09/07/2012  Findings: Chronic right lung base pleural parenchymal scarring/thickening. Trace right effusion not excluded.  Mild increased associated airspace opacity.  Left lung predominately clear.  Status post median sternotomy and CABG.  Sequelae of prior right rib fracture.  No pneumothorax.  IMPRESSION: Chronic right lung base pleural parenchymal scarring.  Mild superimposed opacity may reflect  atelectasis or infiltrate.   Original Report Authenticated By: Jearld Lesch, M.D.    Scheduled Meds: . albuterol  4 mg Oral TID  . aspirin EC  81 mg Oral Daily  . atorvastatin  20 mg Oral q1800  . carvedilol  3.125 mg Oral BID WC  . [START ON 10/30/2012] cyanocobalamin  1,000 mcg Intramuscular Q30 days  . diltiazem  240 mg Oral q morning - 10a  . insulin aspart  0-20 Units Subcutaneous TID WC  . insulin aspart  0-5 Units Subcutaneous QHS  . insulin glargine  55 Units Subcutaneous QHS  . omega-3 acid ethyl esters  1 g Oral Daily  . sodium chloride  3 mL Intravenous Q12H  . sodium chloride  3 mL Intravenous Q12H   Continuous Infusions: . heparin 1,100 Units/hr (10/01/12 0030)    Active Problems:   DIABETES MELLITUS, TYPE II   Right bundle branch block and left anterior fascicular block   CHEST PAIN-UNSPECIFIED   Epigastric pain   NSTEMI (non-ST elevated myocardial infarction)    Time spent:    [facility]  [facility] Pager [phone]. If 7PM-7AM, please contact night-coverage at [email or web address], password [facility] 10/01/2012, 9:22 AM  LOS: 1 day

## 2012-10-01 NOTE — Progress Notes (Signed)
ANTICOAGULATION CONSULT NOTE  Pharmacy Consult for Heparin Indication: chest pain/ACS  No Known Allergies  Patient Measurements: Height: 5\' 11"  (180.3 cm) Weight: 191 lb 9.3 oz (86.9 kg) IBW/kg (Calculated) : 75.3 Heparin Dosing Weight: 88.9 kg  Vital Signs: Temp: 98.3 F (36.8 C) (08/28 0400) Temp src: Oral (08/28 0400) BP: 130/84 mmHg (08/28 0800)  Labs:  Recent Labs  09/29/12 0005  09/30/12 0252 09/30/12 0504 09/30/12 1043 09/30/12 1157 09/30/12 1622 09/30/12 2050 10/01/12 0444  HGB 14.2  --  13.4 12.7*  --   --   --   --  13.2  HCT 42.9  --  40.3 37.8*  --   --   --   --  38.6*  PLT 269  --  233 236  --   --   --   --  208  LABPROT  --   --   --  21.1*  --   --   --   --   --   INR  --   --   --  1.89*  --   --   --   --   --   HEPARINUNFRC  --   --   --   --   --  0.72*  --  0.63 0.46  CREATININE 1.23  --  1.21 1.21  --   --   --   --   --   TROPONINI  --   < > 2.45* 2.89* 2.25*  --  1.45*  --   --   < > = values in this interval not displayed. Estimated Creatinine Clearance: 50.1 ml/min (by C-G formula based on Cr of 1.21).  Medical History: Past Medical History  Diagnosis Date  . Diabetes mellitus   . Anginal pain   . Hyperlipemia   . Cancer   . Lung cancer   . Prostate cancer    Medications:  Scheduled:  . albuterol  4 mg Oral TID  . aspirin EC  81 mg Oral Daily  . atorvastatin  20 mg Oral q1800  . carvedilol  3.125 mg Oral BID WC  . [START ON 10/30/2012] cyanocobalamin  1,000 mcg Intramuscular Q30 days  . diltiazem  240 mg Oral q morning - 10a  . insulin aspart  0-20 Units Subcutaneous TID WC  . insulin aspart  0-5 Units Subcutaneous QHS  . insulin glargine  55 Units Subcutaneous QHS  . omega-3 acid ethyl esters  1 g Oral Daily  . sodium chloride  3 mL Intravenous Q12H  . sodium chloride  3 mL Intravenous Q12H    Assessment: 79 y.o. male with NSTEMI on heparin x48hrs for medical mgmt of event.   Heparin level at goal.  No bleeding noted.    Goal of Therapy:  Heparin level 0.3-0.7 units/ml Monitor platelets by anticoagulation protocol: Yes   Plan:  1) Continue heparin infusion at 1100units/hr 2) Daily heparin level & CBC while on heparin  Zelig Gacek, [facility] 10/01/2012,9:15 AM

## 2012-10-02 DIAGNOSIS — R7309 Other abnormal glucose: Secondary | ICD-10-CM

## 2012-10-02 LAB — HEPARIN LEVEL (UNFRACTIONATED): Heparin Unfractionated: 0.44 IU/mL (ref 0.30–0.70)

## 2012-10-02 LAB — BASIC METABOLIC PANEL
BUN: 13 mg/dL (ref 6–23)
GFR calc Af Amer: 70 mL/min — ABNORMAL LOW (ref >90)
GFR calc non Af Amer: 61 mL/min — ABNORMAL LOW (ref >90)
Potassium: 3.4 mEq/L — ABNORMAL LOW (ref 3.5–5.1)
Sodium: 143 mEq/L (ref 135–145)

## 2012-10-02 LAB — CBC
MCHC: 33.8 g/dL (ref 30.0–36.0)
Platelets: 212 10*3/uL (ref 150–400)
RDW: 13.6 % (ref 11.5–15.5)

## 2012-10-02 LAB — GLUCOSE, CAPILLARY

## 2012-10-02 MED ORDER — ASPIRIN 81 MG PO TBEC: 81.0000 mg | DELAYED_RELEASE_TABLET | Freq: Every day | ORAL | Status: DC

## 2012-10-02 MED ORDER — INSULIN GLARGINE 100 UNIT/ML ~~LOC~~ SOLN: 55.0000 [IU] | Freq: Every day | SUBCUTANEOUS | Status: DC

## 2012-10-02 MED ORDER — CARVEDILOL 3.125 MG PO TABS: 3.1250 mg | ORAL_TABLET | Freq: Two times a day (BID) | ORAL | Status: DC

## 2012-10-02 NOTE — Progress Notes (Signed)
Pt and his sister-in-law verbalize understanding of medications, follow up appt needed with cardiology and PCP, and d/c instructions. Reviewed insulin care with pt and family, they express understanding. IV was d/c without complications. I d/c'd Pt via wheelchair. His sister-in-law escorted him home. Prescriptions and d/c instructions were in hand at time of d/c. Sheryn Bison

## 2012-10-02 NOTE — Progress Notes (Signed)
   Consulting cardiologist: Dr. Charlton Haws  Patient seen and examined. He was in the process of getting dressed to go home. States that he has walked around and has had no chest pain. He is adamant about going home today, does not want to pursue any further cardiac testing at this point. I reminded him of his presentation with evidence of heart attack, specifically NSTEMI. Fortunately, LVEF is preserved by echocardiography. Would plan to continue medical therapy which now includes aspirin, Coreg, Lipitor, and Cardizem CD. He is to keep followup with his primary care physician Dr. Sherwood Gambler. We will arrange a followup visit in our office as well.  Jonelle Sidle, M.D., F.A.C.C.

## 2012-10-02 NOTE — Progress Notes (Signed)
ANTICOAGULATION CONSULT NOTE  Pharmacy Consult for Heparin Indication: NSTEMI  No Known Allergies  Patient Measurements: Height: 5\' 11"  (180.3 cm) Weight: 191 lb 9.3 oz (86.9 kg) IBW/kg (Calculated) : 75.3 Heparin Dosing Weight: 86.9 kg  Vital Signs: Temp: 97.8 F (36.6 C) (08/29 0630) Temp src: Oral (08/29 0630) BP: 130/69 mmHg (08/29 0823) Pulse Rate: 70 (08/29 0823)  Labs:  Recent Labs  09/30/12 0252 09/30/12 0504 09/30/12 1043  09/30/12 1622 09/30/12 2050 10/01/12 0444 10/02/12 0452  HGB 13.4 12.7*  --   --   --   --  13.2 13.4  HCT 40.3 37.8*  --   --   --   --  38.6* 39.7  PLT 233 236  --   --   --   --  208 212  LABPROT  --  21.1*  --   --   --   --   --   --   INR  --  1.89*  --   --   --   --   --   --   HEPARINUNFRC  --   --   --   < >  --  0.63 0.46 0.44  CREATININE 1.21 1.21  --   --   --   --   --  1.10  TROPONINI 2.45* 2.89* 2.25*  --  1.45*  --   --   --   < > = values in this interval not displayed. Estimated Creatinine Clearance: 55.1 ml/min (by C-G formula based on Cr of 1.1).  Medical History: Past Medical History  Diagnosis Date  . Diabetes mellitus   . Anginal pain   . Hyperlipemia   . Cancer   . Lung cancer   . Prostate cancer    Medications:  Scheduled:  . albuterol  4 mg Oral TID  . aspirin EC  81 mg Oral Daily  . atorvastatin  20 mg Oral q1800  . carvedilol  3.125 mg Oral BID WC  . [START ON 10/30/2012] cyanocobalamin  1,000 mcg Intramuscular Q30 days  . diltiazem  240 mg Oral q morning - 10a  . insulin aspart  0-20 Units Subcutaneous TID WC  . insulin aspart  0-5 Units Subcutaneous QHS  . insulin glargine  55 Units Subcutaneous QHS  . omega-3 acid ethyl esters  1 g Oral Daily  . sodium chloride  3 mL Intravenous Q12H  . sodium chloride  3 mL Intravenous Q12H   Assessment: 79 y.o. male with NSTEMI now on heparin > 48hrs for medical mgmt of event.   Heparin level at goal.  No bleeding noted.   Goal of Therapy:  Heparin  level 0.3-0.7 units/ml Monitor platelets by anticoagulation protocol: Yes   Plan:  1) Continue heparin infusion at 1100units/hr 2) Daily heparin level & CBC while on heparin  Mady Gemma 10/02/2012,9:04 AM

## 2012-10-03 NOTE — Discharge Summary (Signed)
Physician Discharge Summary  TARL CEPHAS WUJ:[id number] DOB: 12-23-1927 DOA: 09/30/2012  PCP: [name], MD  Admit date: 09/30/2012 Discharge date: 10/02/2012  Time spent: 40 minutes  Recommendations for Outpatient Follow-up:  1. Patient has been set up with home services for diabetes education 2. Followup with cardiology as an outpatient 3. Followup with primary care as an outpatient  Discharge Diagnoses:  Active Problems:   DIABETES MELLITUS, TYPE II   Right bundle branch block and left anterior fascicular block   CHEST PAIN-UNSPECIFIED   Epigastric pain   NSTEMI (non-ST elevated myocardial infarction)   Discharge Condition: Improved  Diet recommendation: Low salt, low carb  Filed Weights   09/30/12 0231 09/30/12 0800 10/01/12 0500  Weight: 89.812 kg (198 lb) 86.6 kg (190 lb 14.7 oz) 86.9 kg (191 lb 9.3 oz)    History of present illness:  Thomas [name] is a 79 y.o. male who possibly has underlying dementia, presents with 2 days history of intermittent epigastric pain. He came today because of chest and then epigastric pain. He cannot give a good description, I suspect because of his confusion/underlying dementia. He is currently pain-free. When he was evaluated in the emergency room, he was found to have a significantly elevated troponin level, consistent with myocardial infarction. The ER physician has spoken with cardiology, who feels that they would manage him conservatively and therefore the patient will be admitted [facility]. He denies any dyspnea, palpitations. There is no vomiting. The patient is diabetic.   Hospital Course:  This gentleman was admitted to the hospital with intermittent epigastric pain nausea and vomiting. His EKG was nonacute on admission. He was found to have an elevated troponin level consistent with a non-ST elevation MI. He was started on intravenous heparin, continued on beta blocker, statin and aspirin. Echocardiogram was  done which did not show any wall motion abnormalities and a preserved ejection fraction. He was seen by cardiology who felt that invasive testing including cardiac catheterization was not indicated for this patient. They recommended medical management. He was continued on heparin for a total of 48 hours. She has not had any symptoms since he came to the hospital. He was ambulated and did not have any recurrence of angina. The patient is being discharged home in stable condition. Insulin has also been adjusted for better glycemic control and will need to be further addressed in the outpatient setting.  Procedures: Echo: - Left ventricle: The cavity size was normal. Wall thickness was normal. Systolic function was normal. The estimated ejection fraction was in the range of 55% to 60%. Wall motion was normal; there were no regional wall motion abnormalities. - Aortic valve: Mildly calcified annulus. Mildly calcified leaflets. - Mitral valve: Calcified annulus. - Atrial septum: No defect or patent foramen ovale was identified.     Consultations:  Cardiology  Discharge Exam: Filed Vitals:   10/02/12 0823  BP: 130/69  Pulse: 70  Temp:   Resp:     General: No acute distress Cardiovascular:  S1, S2, regular rate and rhythm Respiratory: Clear to auscultation bilaterally  Discharge Instructions  Discharge Orders   Future Appointments Provider Department Dept Phone   10/16/2012 1:00 PM Jodelle Gross, NP [facility] [place] [phone]   02/16/2013 12:00 PM Alleen Borne, MD [facility] [phone]   Future Orders Complete By Expires   Call MD for:  difficulty breathing, headache or visual disturbances  As directed    Call MD  for:  severe uncontrolled pain  As directed    Diet - low sodium heart healthy  As directed    Diet Carb Modified  As directed    Increase activity slowly  As directed        Medication List          albuterol 4 MG tablet  Commonly known as:  PROVENTIL  Take 4 mg by mouth 3 (three) times daily.     ALPRAZolam 1 MG tablet  Commonly known as:  XANAX  Take 1 mg by mouth 3 (three) times daily as needed for anxiety.     aspirin 81 MG EC tablet  Take 1 tablet (81 mg total) by mouth daily.     calcium carbonate 500 MG chewable tablet  Commonly known as:  TUMS - dosed in mg elemental calcium  Chew 1 tablet by mouth daily as needed for heartburn.     carvedilol 3.125 MG tablet  Commonly known as:  COREG  Take 1 tablet (3.125 mg total) by mouth 2 (two) times daily with a meal.     cyanocobalamin 1000 MCG/ML injection  Commonly known as:  (VITAMIN B-12)  Inject 1,000 mcg into the muscle every 30 (thirty) days. Receives at Primary MD office     diltiazem 240 MG 24 hr capsule  Commonly known as:  CARDIZEM CD  Take 1 capsule (240 mg total) by mouth every morning.     fish oil-omega-3 fatty acids 1000 MG capsule  Take 1 g by mouth daily.     HYDROcodone-acetaminophen 5-325 MG per tablet  Commonly known as:  NORCO/VICODIN  Take 1 tablet by mouth every 6 (six) hours as needed for pain.     insulin glargine 100 UNIT/ML injection  Commonly known as:  LANTUS  Inject 0.55 mLs (55 Units total) into the skin daily. Injects according to home sliding scale.     pravastatin 80 MG tablet  Commonly known as:  PRAVACHOL  Take 1 tablet (80 mg total) by mouth every evening.       No Known Allergies     Follow-up Information   Follow up with [name], MD. Schedule an appointment as soon as possible for a visit in 2 weeks.   Specialty:  Internal Medicine   Contact information:   [address] [place] [address] [phone]        The results of significant diagnostics from this hospitalization (including imaging, microbiology, ancillary and laboratory) are listed below for reference.    Significant Diagnostic Studies: Dg Chest 1 View  09/07/2012    *RADIOLOGY REPORT*  Clinical Data: Altered mental status, fever  CHEST - 1 VIEW  Comparison: February 18, 2012  Findings: There is tenting of right hemidiaphragm unchanged.  There is no focal infiltrate, pulmonary edema or pleural effusion.  The patient is status post CABG.  The mediastinal contour and cardiac silhouette are stable.  Postsurgical changes of the right ribs and right lung are stable.  IMPRESSION: Chronic changes of right chest.  No acute cardiopulmonary disease identified.   Original Report Authenticated By: Sherian Rein, M.D.   Dg Lumbar Spine Complete  09/07/2012   *RADIOLOGY REPORT*  Clinical Data: Low back pain.  No known injury.  LUMBAR SPINE - COMPLETE 4+ VIEW  Comparison: 05/31/2010  Findings: Prior vertebroplasty L1.  Normal alignment.  No acute fracture.  Degenerative disc disease changes at L5 S1 with disc space narrowing, stable.  Mild degenerative facet disease in  the lower lumbar spine.  Sclerosis around the left SI joint compatible with sacroiliitis.  IMPRESSION: Prior L1 vertebroplasty.  Degenerative changes.  No acute bony abnormality.   Original Report Authenticated By: Charlett Nose, M.D.   Ct Head Wo Contrast  09/07/2012   *RADIOLOGY REPORT*  Clinical Data: Altered mental status, history of lung and prostate cancer  CT HEAD WITHOUT CONTRAST  Technique:  Contiguous axial images were obtained from the base of the skull through the vertex without contrast.  Comparison: 11/27/2011  Findings: No skull fracture is noted.  Stable probable calcified meningioma in the right frontal region no surrounding mass effect or edema.  No intracranial hemorrhage, mass effect or midline shift. Paranasal sinuses and mastoid air cells are unremarkable.  No acute cortical infarction.  No mass lesion is noted on this unenhanced scan.  Stable cerebral atrophy.  Stable periventricular and patchy subcortical white matter decreased attenuation consistent with chronic small vessel ischemic changes. No  parenchymal brain lesion is identified on this unenhanced scan.  IMPRESSION: No acute intracranial abnormality.  Stable extra-axial probable calcified meningioma in the right frontal region.  Stable atrophy and chronic white matter disease.  No definite acute cortical infarction.   Original Report Authenticated By: Natasha Mead, M.D.   Mr Laqueta Jean Wo Contrast  09/07/2012   *RADIOLOGY REPORT*  Clinical Data: Altered mental status.  Weakness.  History of prostate cancer.  MRI HEAD WITHOUT AND WITH CONTRAST  Technique:  Multiplanar, multiecho pulse sequences of the brain and surrounding structures were obtained according to standard protocol without and with intravenous contrast  Contrast: 9mL MULTIHANCE GADOBENATE DIMEGLUMINE 529 MG/ML IV SOLN  Comparison: 09/07/2012 CT.  09/29/2007 MR.  Findings: Motion degraded exam.  No acute infarct.  No intracranial hemorrhage.  Anterior superior right frontal 2.8 x 2.2 x 0.7 cm en plaque meningioma stable since 2009.  No other intracranial mass or abnormal enhancement.  Moderate small vessel disease type changes.  Global atrophy without hydrocephalus.  Major intracranial vascular structures are patent.  Minimal mucosal thickening ethmoid sinus air cells.  Mild transverse ligament hypertrophy.  Cervical medullary junction, pituitary region, pineal region and orbital structures unremarkable.  IMPRESSION: Motion degraded exam.  No acute infarct.  No intracranial hemorrhage.  Anterior superior right frontal 2.8 x 2.2 x 0.7 cm en plaque meningioma stable since 2009.  Moderate small vessel disease type changes.   Original Report Authenticated By: Lacy Duverney, M.D.   Dg Chest Portable 1 View  09/30/2012   *RADIOLOGY REPORT*  Clinical Data: Chest pain  PORTABLE CHEST - 1 VIEW  Comparison: 09/07/2012  Findings: Chronic right lung base pleural parenchymal scarring/thickening. Trace right effusion not excluded.  Mild increased associated airspace opacity.  Left lung predominately clear.   Status post median sternotomy and CABG.  Sequelae of prior right rib fracture.  No pneumothorax.  IMPRESSION: Chronic right lung base pleural parenchymal scarring.  Mild superimposed opacity may reflect atelectasis or infiltrate.   Original Report Authenticated By: Jearld Lesch, M.D.    Microbiology: Recent Results (from the past 240 hour(s))  MRSA PCR SCREENING     Status: None   Collection Time    09/30/12  8:00 AM      Result Value Range Status   MRSA by PCR NEGATIVE  NEGATIVE Final   Comment:            The GeneXpert MRSA Assay (FDA     approved for NASAL specimens     only), is one component of  a     comprehensive MRSA colonization     surveillance program. It is not     intended to diagnose MRSA     infection nor to guide or     monitor treatment for     MRSA infections.     Labs: Basic Metabolic Panel:  Recent Labs Lab 09/29/12 0005 09/30/12 0252 09/30/12 0504 10/02/12 0452  NA 138 136 138 143  K 4.1 3.8 3.9 3.4*  CL 101 99 103 105  CO2 27 26 26 30   GLUCOSE 420* 331* 272* 83  BUN 17 17 16 13   CREATININE 1.23 1.21 1.21 1.10  CALCIUM 10.3 9.9 9.3 9.6   Liver Function Tests:  Recent Labs Lab 09/30/12 0252 09/30/12 0504  AST 25 22  ALT 26 24  ALKPHOS 67 60  BILITOT 0.6 0.5  PROT 6.4 5.7*  ALBUMIN 3.2* 2.9*    Recent Labs Lab 09/30/12 0252  LIPASE 60*   No results found for this basename: AMMONIA,  in the last 168 hours CBC:  Recent Labs Lab 09/29/12 0005 09/30/12 0252 09/30/12 0504 10/01/12 0444 10/02/12 0452  WBC 6.0 7.5 6.4 5.0 6.2  NEUTROABS 3.6 5.2  --   --   --   HGB 14.2 13.4 12.7* 13.2 13.4  HCT 42.9 40.3 37.8* 38.6* 39.7  MCV 96.2 94.4 94.7 93.5 93.2  PLT 269 233 236 208 212   Cardiac Enzymes:  Recent Labs Lab 09/30/12 0252 09/30/12 0504 09/30/12 1043 09/30/12 1622  TROPONINI 2.45* 2.89* 2.25* 1.45*   BNP: BNP (last 3 results) No results found for this basename: PROBNP,  in the last 8760 hours CBG:  Recent  Labs Lab 10/01/12 1105 10/01/12 1726 10/01/12 2101 10/02/12 0804 10/02/12 1150  GLUCAP 178* 185* 192* 92 139*       Signed:  Dezra Mandella  [facility] 10/03/2012, 6:50 PM

## 2012-10-16 ENCOUNTER — Ambulatory Visit (INDEPENDENT_AMBULATORY_CARE_PROVIDER_SITE_OTHER): Payer: Medicare Other | Admitting: Adult Health

## 2012-10-16 ENCOUNTER — Encounter: Payer: Self-pay | Admitting: Adult Health

## 2012-10-16 ENCOUNTER — Ambulatory Visit (HOSPITAL_COMMUNITY)
Admission: RE | Admit: 2012-10-16 | Discharge: 2012-10-16 | Disposition: A | Discharge status: Home / Self Care | Payer: Medicare Other | Source: Ambulatory Visit | Attending: Adult Health | Admitting: Adult Health

## 2012-10-16 VITALS — BP 130/61 | HR 69 | Ht 71.0 in | Wt 187.8 lb

## 2012-10-16 DIAGNOSIS — Z85118 Personal history of other malignant neoplasm of bronchus and lung: Secondary | ICD-10-CM | POA: Insufficient documentation

## 2012-10-16 DIAGNOSIS — R079 Chest pain, unspecified: Secondary | ICD-10-CM | POA: Insufficient documentation

## 2012-10-16 DIAGNOSIS — R911 Solitary pulmonary nodule: Secondary | ICD-10-CM | POA: Insufficient documentation

## 2012-10-16 DIAGNOSIS — J449 Chronic obstructive pulmonary disease, unspecified: Secondary | ICD-10-CM

## 2012-10-16 DIAGNOSIS — J4489 Other specified chronic obstructive pulmonary disease: Secondary | ICD-10-CM

## 2012-10-16 DIAGNOSIS — I25810 Atherosclerosis of coronary artery bypass graft(s) without angina pectoris: Secondary | ICD-10-CM

## 2012-10-16 DIAGNOSIS — R0602 Shortness of breath: Secondary | ICD-10-CM | POA: Insufficient documentation

## 2012-10-16 MED ORDER — ISOSORBIDE MONONITRATE ER 30 MG PO TB24: 30.0000 mg | ORAL_TABLET | Freq: Every day | ORAL | Status: DC

## 2012-10-16 MED ORDER — ALBUTEROL SULFATE (5 MG/ML) 0.5% IN NEBU: 2.5000 mg | INHALATION_SOLUTION | Freq: Once | RESPIRATORY_TRACT | Status: AC

## 2012-10-16 NOTE — Assessment & Plan Note (Signed)
He was to be managed medically according to prior admission notes. He is not actually having chest pain. It was more dyspnea on exertion and weakness He has some wheezes and crackles on lung exam. He has responded to 1 NTG tablet SL and rest.   I will d/c coreg as this may be causing bronchospasms. I will start Isosorbide, 30 mg daily.

## 2012-10-16 NOTE — Progress Notes (Signed)
HPI: Mr. Polansky is an 79 year old patient formerly of Dr. Juanito Doom we are following for ongoing assessment and management of CAD, status post coronary artery bypass grafting, with history of COPD and hyperlipidemia. The patient was last seen by Dr. Daleen Squibb on 03/25/2012 with continued medical management.   The patient was admitted to [facility] August of 2014 with complaints of chest discomfort, non-ST elevation MI and intermittent gastric pain patient has a history of dementia as well. He was found to have an elevated troponin. Echocardiogram did not find him to have any wall motion abnormalities. Cardiac catheterization was not indicated. The patient was treated medically. Echocardiogram completed during that hospitalization demonstrated normal systolic function with an EF of 55-60%. The patient was continued on carvedilol, aspirin, diltiazem, pravastatin, and other noncardiac medications.    He walked into clinic with complaints of weakness and shortness of breath. He also complained of some left arm pain. He was given NTG sublingual X 1 with relief of symptoms with rest. He is a little confused as to his symptoms.   No Known Allergies  Current Outpatient Prescriptions  Medication Sig Dispense Refill  . albuterol (PROVENTIL) 4 MG tablet Take 4 mg by mouth 3 (three) times daily.      Marland Kitchen ALPRAZolam (XANAX) 1 MG tablet Take 1 mg by mouth 3 (three) times daily as needed for anxiety.      Marland Kitchen aspirin EC 81 MG EC tablet Take 1 tablet (81 mg total) by mouth daily.  30 tablet  1  . calcium carbonate (TUMS - DOSED IN MG ELEMENTAL CALCIUM) 500 MG chewable tablet Chew 1 tablet by mouth daily as needed for heartburn.      . cyanocobalamin (,VITAMIN B-12,) 1000 MCG/ML injection Inject 1,000 mcg into the muscle every 30 (thirty) days. Receives at Primary MD office      . diltiazem (CARDIZEM CD) 240 MG 24 hr capsule Take 1 capsule (240 mg total) by mouth every morning.  90 capsule  3  . donepezil (ARICEPT)  5 MG tablet       . fish oil-omega-3 fatty acids 1000 MG capsule Take 1 g by mouth daily.      Marland Kitchen HYDROcodone-acetaminophen (NORCO/VICODIN) 5-325 MG per tablet Take 1 tablet by mouth every 6 (six) hours as needed for pain.      Marland Kitchen insulin glargine (LANTUS) 100 UNIT/ML injection Inject 0.55 mLs (55 Units total) into the skin daily. Injects according to home sliding scale.  10 mL  12  . pravastatin (PRAVACHOL) 80 MG tablet Take 1 tablet (80 mg total) by mouth every evening.  30 tablet  11   No current facility-administered medications for this visit.    Past Medical History  Diagnosis Date  . Diabetes mellitus   . Anginal pain   . Hyperlipemia   . Cancer   . Lung cancer   . Prostate cancer     Past Surgical History  Procedure Laterality Date  . Back surgery    . Tumor removal      from lung  . Prostate surgery    . Cardiac surgery    . Surgery to repair aneurysm      WUJ:WJXBJY of systems complete and found to be negative unless listed above  PHYSICAL EXAM BP 130/61  Pulse 69  Ht 5\' 11"  (1.803 m)  Wt 187 lb 12 oz (85.163 kg)  BMI 26.2 kg/m2  SpO2 96% General: Well developed, well nourished, in no acute distress Head: Eyes  PERRLA, No xanthomas.   Normal cephalic and atramatic  Lungs: Bilateral wheezes and crackles without coughing.  Heart: HRRR S1 S2, without MRG.  Pulses are 2+ & equal.            No carotid bruit. No JVD.  No abdominal bruits. No femoral bruits. Abdomen: Bowel sounds are positive, abdomen soft and non-tender without masses or                  Hernia's noted. Msk:  Back normal, normal gait. Normal strength and tone for age. Extremities: No clubbing, cyanosis or non-pitting edema.  DP +1 Neuro: Alert and oriented X 3. Psych:  Good affect, responds appropriately  EKG: NSR with RBBB. T-wave inverison noted in septal lead, unchanged since prior EKG in August of 2014.  ASSESSMENT AND PLAN

## 2012-10-16 NOTE — Assessment & Plan Note (Addendum)
Review of home medications has him on albuterol treatments TID. He states he is not using these medications and is not aware he should be on this. I have called respiratory to provide a breathing treatment.   I will have CXR today with BMET and Troponin today. I will check with family who is on their way, to ask about home breathing treatments.   After talking with his family, they state that his dementia is worsening and they are considering inpt [facility] for this. He is feeling much better after breathing treatment and should continue to be compliant with this. Family to come with him on future appts.

## 2012-10-16 NOTE — Progress Notes (Signed)
Name: Thomas [name]    DOB: 03-01-1927  Age: 79 y.o.  MR#: [id number]       PCP:  [name], MD      Insurance: Payor: [employer] MEDICARE / Plan: AARP MEDICARE COMPLETE / Product Type: *No Product type* /   CC:    Chief Complaint  Patient presents with  . Coronary Artery Disease    VS Filed Vitals:   10/16/12 1248  BP: 130/61  Pulse: 69  Height: 5\' 11"  (1.803 m)  Weight: 187 lb 12 oz (85.163 kg)  SpO2: 96%    Weights Current Weight  10/16/12 187 lb 12 oz (85.163 kg)  10/01/12 191 lb 9.3 oz (86.9 kg)  09/07/12 198 lb 3.2 oz (89.903 kg)    Blood Pressure  BP Readings from Last 3 Encounters:  10/16/12 130/61  10/02/12 130/69  09/28/12 146/61     Admit date:  (Not on file) Last encounter with RMR:  Visit date not found   Allergy Review of patient's allergies indicates no known allergies.  Current Outpatient Prescriptions  Medication Sig Dispense Refill  . albuterol (PROVENTIL) 4 MG tablet Take 4 mg by mouth 3 (three) times daily.      . carvedilol (COREG) 3.125 MG tablet Take 1 tablet (3.125 mg total) by mouth 2 (two) times daily with a meal.  60 tablet  1  . ALPRAZolam (XANAX) 1 MG tablet Take 1 mg by mouth 3 (three) times daily as needed for anxiety.      Marland Kitchen aspirin EC 81 MG EC tablet Take 1 tablet (81 mg total) by mouth daily.  30 tablet  1  . calcium carbonate (TUMS - DOSED IN MG ELEMENTAL CALCIUM) 500 MG chewable tablet Chew 1 tablet by mouth daily as needed for heartburn.      . cyanocobalamin (,VITAMIN B-12,) 1000 MCG/ML injection Inject 1,000 mcg into the muscle every 30 (thirty) days. Receives at Primary MD office      . diltiazem (CARDIZEM CD) 240 MG 24 hr capsule Take 1 capsule (240 mg total) by mouth every morning.  90 capsule  3  . fish oil-omega-3 fatty acids 1000 MG capsule Take 1 g by mouth daily.      Marland Kitchen HYDROcodone-acetaminophen (NORCO/VICODIN) 5-325 MG per tablet Take 1 tablet by mouth every 6 (six) hours as needed for pain.      Marland Kitchen  insulin glargine (LANTUS) 100 UNIT/ML injection Inject 0.55 mLs (55 Units total) into the skin daily. Injects according to home sliding scale.  10 mL  12  . pravastatin (PRAVACHOL) 80 MG tablet Take 1 tablet (80 mg total) by mouth every evening.  30 tablet  11   No current facility-administered medications for this visit.    Discontinued Meds:   There are no discontinued medications.  Patient Active Problem List   Diagnosis Date Noted  . Epigastric pain 09/30/2012  . NSTEMI (non-ST elevated myocardial infarction) 09/30/2012  . Acute renal failure 09/09/2012  . Acute encephalopathy 09/07/2012  . Malignant neoplasm of upper lobe, bronchus or lung 02/19/2011  . Right bundle branch block and left anterior fascicular block 03/31/2009  . CHEST PAIN-UNSPECIFIED 03/31/2009  . DIABETES MELLITUS, TYPE II 07/18/2008  . HYPERLIPIDEMIA-MIXED 07/18/2008  . CAD, ARTERY BYPASS GRAFT 07/18/2008  . COPD 07/18/2008    LABS    Component Value Date/Time   NA 143 10/02/2012 0452   NA 138 09/30/2012 0504   NA 136 09/30/2012 0252   K 3.4* 10/02/2012 9147  K 3.9 09/30/2012 0504   K 3.8 09/30/2012 0252   CL 105 10/02/2012 0452   CL 103 09/30/2012 0504   CL 99 09/30/2012 0252   CO2 30 10/02/2012 0452   CO2 26 09/30/2012 0504   CO2 26 09/30/2012 0252   GLUCOSE 83 10/02/2012 0452   GLUCOSE 272* 09/30/2012 0504   GLUCOSE 331* 09/30/2012 0252   BUN 13 10/02/2012 0452   BUN 16 09/30/2012 0504   BUN 17 09/30/2012 0252   CREATININE 1.10 10/02/2012 0452   CREATININE 1.21 09/30/2012 0504   CREATININE 1.21 09/30/2012 0252   CALCIUM 9.6 10/02/2012 0452   CALCIUM 9.3 09/30/2012 0504   CALCIUM 9.9 09/30/2012 0252   GFRNONAA 61* 10/02/2012 0452   GFRNONAA 54* 09/30/2012 0504   GFRNONAA 54* 09/30/2012 0252   GFRAA 70* 10/02/2012 0452   GFRAA 63* 09/30/2012 0504   GFRAA 63* 09/30/2012 0252   CMP     Component Value Date/Time   NA 143 10/02/2012 0452   K 3.4* 10/02/2012 0452   CL 105 10/02/2012 0452   CO2 30 10/02/2012 0452    GLUCOSE 83 10/02/2012 0452   BUN 13 10/02/2012 0452   CREATININE 1.10 10/02/2012 0452   CALCIUM 9.6 10/02/2012 0452   PROT 5.7* 09/30/2012 0504   ALBUMIN 2.9* 09/30/2012 0504   AST 22 09/30/2012 0504   ALT 24 09/30/2012 0504   ALKPHOS 60 09/30/2012 0504   BILITOT 0.5 09/30/2012 0504   GFRNONAA 61* 10/02/2012 0452   GFRAA 70* 10/02/2012 0452       Component Value Date/Time   WBC 6.2 10/02/2012 0452   WBC 5.0 10/01/2012 0444   WBC 6.4 09/30/2012 0504   HGB 13.4 10/02/2012 0452   HGB 13.2 10/01/2012 0444   HGB 12.7* 09/30/2012 0504   HCT 39.7 10/02/2012 0452   HCT 38.6* 10/01/2012 0444   HCT 37.8* 09/30/2012 0504   MCV 93.2 10/02/2012 0452   MCV 93.5 10/01/2012 0444   MCV 94.7 09/30/2012 0504    Lipid Panel     Component Value Date/Time   CHOL 93 10/01/2012 0931   TRIG 60 10/01/2012 0931   HDL 55 10/01/2012 0931   CHOLHDL 1.7 10/01/2012 0931   VLDL 12 10/01/2012 0931   LDLCALC 26 10/01/2012 0931    ABG    Component Value Date/Time   PHART 7.298* 12/15/2006 0600   PCO2ART 49.4* 12/15/2006 0600   PO2ART 84.8 12/15/2006 0600   HCO3 25.3* 01/24/2007 2121   TCO2 25 12/20/2010 2340   ACIDBASEDEF 1.0 01/24/2007 2121   O2SAT 96.4 12/15/2006 0600     Lab Results  Component Value Date   TSH 0.784 09/30/2012   BNP (last 3 results) No results found for this basename: PROBNP,  in the last 8760 hours Cardiac Panel (last 3 results) No results found for this basename: CKTOTAL, CKMB, TROPONINI, RELINDX,  in the last 72 hours  Iron/TIBC/Ferritin No results found for this basename: iron, tibc, ferritin     EKG Orders placed in visit on 10/16/12  . EKG 12-LEAD     Prior Assessment and Plan Problem List as of 10/16/2012     Cardiovascular and Mediastinum   CAD, ARTERY BYPASS GRAFT   Last Assessment & Plan   03/25/2012 Office Visit Written 03/25/2012  5:04 PM by Gaylord Shih, MD     Stable. Continue secondary preventative therapy. Return the office in one year.    Right bundle branch block and  left anterior fascicular block  Last Assessment & Plan   03/29/2011 Office Visit Written 03/29/2011  2:19 PM by Gaylord Shih, MD     Unchanged.    NSTEMI (non-ST elevated myocardial infarction)     Respiratory   COPD   Malignant neoplasm of upper lobe, bronchus or lung   Last Assessment & Plan   02/19/2011 Office Visit Written 02/19/2011 11:56 AM by Alleen Borne, MD     S/p right upper lobectomy and right lower lobe superior segmentectomy 09/05/1999 for T2, N0, M0 squamous cell carcinoma.      Endocrine   DIABETES MELLITUS, TYPE II     Nervous and Auditory   Acute encephalopathy     Genitourinary   Acute renal failure     Other   HYPERLIPIDEMIA-MIXED   Last Assessment & Plan   03/25/2012 Office Visit Written 03/25/2012  5:05 PM by Gaylord Shih, MD     Patient is running out of his pravastatin. Prescription redone. Annual fasting lipid profile and LFTs.    CHEST PAIN-UNSPECIFIED   Last Assessment & Plan   03/25/2012 Office Visit Written 03/25/2012  5:04 PM by Gaylord Shih, MD     Sharp stabbing well localized discomfort noncardiac. Patient reassured.    Epigastric pain       Imaging: Dg Chest Portable 1 View  09/30/2012   *RADIOLOGY REPORT*  Clinical Data: Chest pain  PORTABLE CHEST - 1 VIEW  Comparison: 09/07/2012  Findings: Chronic right lung base pleural parenchymal scarring/thickening. Trace right effusion not excluded.  Mild increased associated airspace opacity.  Left lung predominately clear.  Status post median sternotomy and CABG.  Sequelae of prior right rib fracture.  No pneumothorax.  IMPRESSION: Chronic right lung base pleural parenchymal scarring.  Mild superimposed opacity may reflect atelectasis or infiltrate.   Original Report Authenticated By: Jearld Lesch, M.D.

## 2012-10-16 NOTE — Patient Instructions (Signed)
Your physician recommends that you schedule a follow-up appointment in: 1 week with Joni Reining, NP  Your physician recommends that you return for lab work today. BMET/TROPONIN  Your physician has recommended you make the following change in your medication:  1.DISCONTINUE COREG 2. START IMDUR 30 MG DAILY 3.MAKE SURE YOU ARE TAKING YOUR ALBUTEROL  A chest x-ray takes a picture of the organs and structures inside the chest, including the heart, lungs, and blood vessels. This test can show several things, including, whether the heart is enlarges; whether fluid is building up in the lungs; and whether pacemaker / defibrillator leads are still in place.

## 2012-10-23 ENCOUNTER — Ambulatory Visit: Payer: Medicare Other | Admitting: Cardiology

## 2012-11-01 DIAGNOSIS — R899 Unspecified abnormal finding in specimens from other organs, systems and tissues: Secondary | ICD-10-CM | POA: Insufficient documentation

## 2012-11-01 NOTE — Progress Notes (Signed)
Patient ID: Thomas [name], male   DOB: 09/02/1927, 79 y.o.   MRN: [id number] This is a discharge note.  Level of care skilled.  Facility [facility].  Date is 09/15/2012 .  Chief complaint -discharge note .  History of present illness.  Patient is a pleasant 79 year old male with a history of chronic back pain he presented to the ER with increased confusion and possible hallucinations.  ER workup was unremarkable except for mild liver function test elevation and a creatinine of 1.4.  His mental status gradually improved.  Infectious workup was negative as was imaging.  Neurology did evaluate him and an EEG was obtained secondary to apparently some history of the meningioma  It did not show anything remarkable other than occasional mild generalized slowing.  It appears per discussion with patient's wife and primary care provider that he has had progressive memory problems over the past couple years.-- for instance sometimes sitting in the car but not going anywhere  It was thought he would benefit from some rehabilitation in a skilled facility.  Of note workup included a urinalysis CT of the head urine drug screen TSH and MRI of the brain all these were nondiagnostic  Patient's stay here has been relatively unremarkable we did update labs which were essentially unremarkable his creatinine was 1.1 BUN 17.  He remains pleasantly confused however and he will be going to a facility with a dementia unit which I think is a good idea he does have some history of wandering  Previous medical history.  Encephalopathy which has improved.  Suspected dementia.  Acute renal failure which has resolved.  Elevated liver function tests which have normalized out possibly secondary to statin .  Diabetes type 2.  Chronic back pain.  Suspected vitamin B12 deficiency.  Intermitted thrombocytopenia.  History of prostate cancer.  History of lung cancer.  Previous surgical history.  History of tumor removal from  lung.  Prostate surgery.  Surgery to repair an aneurysm .  Social history.  Patient is married he is a former smoker one pack per day for 50 years.  He did use smokeless tobacco at well and apparently still uses it  t no history of significant alcohol use   Medications. Have been reviewed per [facility] studies-as noted above MRI CT scan of the head did not show any acute process.  09/07/2012.  CT of the head-did show a probable calcified meningioma the right frontal region  Subsequent MRI did show stability of this since 2009.   Review of systems.--Limited since patient appears to be a poor historian but he has no acute complaints other than wanting to go home--this has been a persistent complaint  In general denies any fever or chills.  Skin-does not complain of any itching or rash.  Head ears eyes nose mouth and throat is not complaining of visual changes sore throat or nasal discharge.  Respiratory-does not complain of shortness of breath or cough.  Cardiac no complaints of chest pain.  GI-not complaining of any nausea vomiting or abdominal pain.  Muscle skeletal-does not complaining of joint pain.  Neurologic does not complaining of a headache or dizziness.  Psych does have some history apparently of dementia with recent encephalopathy that has apparently gotten  Better   Physical exam.  Temperature 97.8 pulse 70 respirations 20 blood pressure 113/69 this is baseline his weight is 192.8.  In general this is a pleasant elderly male in no distress he is somewhat confused.  His skin is warm and dry.  Eyes pupils appear equal round reactive to light visual acuity appears intact.  Oropharynx is clear he is edentulous.  Heart is regular rate and rhythm without murmur gallop or rub.  Chest there is a small amount of wheezing in the lower bases otherwise clear to auscultation no labored breathing.  Abdomen is soft nontender obese with positive bowel sounds.  GU exam is within  normal limits he is circumcised.  Legs he does have arthritic changes of the knees bilaterally but I do not note any deformities there is no lower extremity edema.  Circulation pedal pulses are intact bilaterally.  Neurologic is intact his speech is clear I do not see any lateralizing findings.  Psych he is oriented to self only he is very pleasant   Labs. 09/14/2012.  WBC 8.2 hemoglobin 14.4 platelets 139.  Sodium 140 potassium 3.9 BUN 17 creatinine 1.10.  Liver function tests within normal limits except AST of 47  09/09/2012.  Sodium 141 potassium 3.5 BUN 14 creatinine 0.93-liver function tests within normal limits except albumin of 3.0 AST of 38 this is trending down.  By the seventh 2014.  WBC 5.1 hemoglobin 13.9 platelets 124 this is relatively baseline.  Hemoglobin A1c 7.0.  T12 level-180.  09/07/2012.  WUJ-8.119.  assessment and plan.  #1 encephalopathy-apparently this has improved patient still appears to be confused apparently this is relatively his baseline- It appears dementia unit is a good option here.  #2-history of elevated liver function tests-this was thought possibly secondary to statin it was trending down upon hospital discharge w--it is still mildly elevated-will defer followup to primary care provider.  #3 history of mild thrombocytopenia this is stable per recent lab  #4-history of acute renal failure apparently this resolved with hydration i and has remained stable  #5-history of chronic back pain-- not complaining of back pain today will continue to monitor this apparently this has been stable for some time.  #6-suspect some history of COPD here he is on albuterol this appears stable a small amount of wheezing on exam today  this was unchanged from previous exam.  #7-B12 deficiency-he continues on supplementation.  #8 past history of hypertension he is on Cardizem at this point this appears to be stable per recent blood pressures. -Recent one was  113/69-110/90-126/73 #9-history of diabetes type 2-he is on Lantus --his blood sugars here have been satisfactory in the low to mid 100s   #10 as history of hyperlipidemia he is on a statin --.  AST continues to be mildly elevated this will need followup by primary care provider-he does not appear to be symptomatic certainly  [id number]      .

## 2012-11-13 ENCOUNTER — Emergency Department (HOSPITAL_COMMUNITY): Payer: Medicare Other

## 2012-11-13 ENCOUNTER — Encounter (HOSPITAL_COMMUNITY): Payer: Self-pay | Admitting: Emergency Medicine

## 2012-11-13 ENCOUNTER — Emergency Department (HOSPITAL_COMMUNITY)
Admission: EM | Admit: 2012-11-13 | Discharge: 2012-11-13 | Disposition: A | Discharge status: Home / Self Care | Payer: Medicare Other | Attending: Emergency Medicine | Admitting: Emergency Medicine

## 2012-11-13 DIAGNOSIS — E785 Hyperlipidemia, unspecified: Secondary | ICD-10-CM | POA: Insufficient documentation

## 2012-11-13 DIAGNOSIS — R1031 Right lower quadrant pain: Secondary | ICD-10-CM | POA: Insufficient documentation

## 2012-11-13 DIAGNOSIS — Z8546 Personal history of malignant neoplasm of prostate: Secondary | ICD-10-CM | POA: Insufficient documentation

## 2012-11-13 DIAGNOSIS — Z8679 Personal history of other diseases of the circulatory system: Secondary | ICD-10-CM | POA: Insufficient documentation

## 2012-11-13 DIAGNOSIS — Z87891 Personal history of nicotine dependence: Secondary | ICD-10-CM | POA: Insufficient documentation

## 2012-11-13 DIAGNOSIS — Z85118 Personal history of other malignant neoplasm of bronchus and lung: Secondary | ICD-10-CM | POA: Insufficient documentation

## 2012-11-13 DIAGNOSIS — R1011 Right upper quadrant pain: Secondary | ICD-10-CM | POA: Insufficient documentation

## 2012-11-13 DIAGNOSIS — R300 Dysuria: Secondary | ICD-10-CM | POA: Insufficient documentation

## 2012-11-13 DIAGNOSIS — R109 Unspecified abdominal pain: Secondary | ICD-10-CM

## 2012-11-13 DIAGNOSIS — Z79899 Other long term (current) drug therapy: Secondary | ICD-10-CM | POA: Insufficient documentation

## 2012-11-13 DIAGNOSIS — Z7982 Long term (current) use of aspirin: Secondary | ICD-10-CM | POA: Insufficient documentation

## 2012-11-13 DIAGNOSIS — E119 Type 2 diabetes mellitus without complications: Secondary | ICD-10-CM | POA: Insufficient documentation

## 2012-11-13 DIAGNOSIS — Z794 Long term (current) use of insulin: Secondary | ICD-10-CM | POA: Insufficient documentation

## 2012-11-13 LAB — COMPREHENSIVE METABOLIC PANEL
ALT: 13 U/L (ref 0–53)
Albumin: 3.7 g/dL (ref 3.5–5.2)
Alkaline Phosphatase: 55 U/L (ref 39–117)
BUN: 12 mg/dL (ref 6–23)
Chloride: 104 mEq/L (ref 96–112)
Glucose, Bld: 137 mg/dL — ABNORMAL HIGH (ref 70–99)
Potassium: 3.4 mEq/L — ABNORMAL LOW (ref 3.5–5.1)
Total Bilirubin: 0.8 mg/dL (ref 0.3–1.2)

## 2012-11-13 LAB — CBC WITH DIFFERENTIAL/PLATELET
Basophils Relative: 1 % (ref 0–1)
Eosinophils Absolute: 0.4 10*3/uL (ref 0.0–0.7)
Eosinophils Relative: 5 % (ref 0–5)
HCT: 41.4 % (ref 39.0–52.0)
Hemoglobin: 14.4 g/dL (ref 13.0–17.0)
Lymphs Abs: 2.2 10*3/uL (ref 0.7–4.0)
MCH: 32 pg (ref 26.0–34.0)
MCHC: 34.8 g/dL (ref 30.0–36.0)
MCV: 92 fL (ref 78.0–100.0)
Monocytes Absolute: 0.7 10*3/uL (ref 0.1–1.0)
Monocytes Relative: 10 % (ref 3–12)
Neutrophils Relative %: 54 % (ref 43–77)

## 2012-11-13 LAB — URINE MICROSCOPIC-ADD ON

## 2012-11-13 LAB — URINALYSIS, ROUTINE W REFLEX MICROSCOPIC
Bilirubin Urine: NEGATIVE
Ketones, ur: NEGATIVE mg/dL
Nitrite: NEGATIVE
pH: 5.5 (ref 5.0–8.0)

## 2012-11-13 LAB — PROTIME-INR: INR: 1.04 (ref 0.00–1.49)

## 2012-11-13 LAB — LIPASE, BLOOD: Lipase: 52 U/L (ref 11–59)

## 2012-11-13 MED ORDER — IOHEXOL 300 MG/ML  SOLN
100.0000 mL | Freq: Once | INTRAMUSCULAR | Status: AC | PRN
  Administered 2012-11-13: 100 mL via INTRAVENOUS

## 2012-11-13 MED ORDER — HYDROMORPHONE HCL PF 1 MG/ML IJ SOLN
0.5000 mg | INTRAMUSCULAR | Status: DC | PRN
  Administered 2012-11-13: 0.5 mg via INTRAVENOUS
  Filled 2012-11-13: qty 1

## 2012-11-13 MED ORDER — SODIUM CHLORIDE 0.9 % IV SOLN
1000.0000 mL | INTRAVENOUS | Status: DC
  Administered 2012-11-13: 1000 mL via INTRAVENOUS

## 2012-11-13 MED ORDER — HYDROCODONE-ACETAMINOPHEN 5-325 MG PO TABS: 1.0000 | ORAL_TABLET | Freq: Four times a day (QID) | ORAL | Status: DC | PRN

## 2012-11-13 MED ORDER — IOHEXOL 300 MG/ML  SOLN
50.0000 mL | Freq: Once | INTRAMUSCULAR | Status: AC | PRN
  Administered 2012-11-13: 50 mL via ORAL

## 2012-11-13 MED ORDER — ONDANSETRON HCL 4 MG/2ML IJ SOLN
4.0000 mg | Freq: Once | INTRAMUSCULAR | Status: AC
  Administered 2012-11-13: 4 mg via INTRAVENOUS
  Filled 2012-11-13: qty 2

## 2012-11-13 NOTE — ED Notes (Signed)
Pt reports that bs 89 this am

## 2012-11-13 NOTE — ED Notes (Signed)
Pt reports right lower quad ab pain that started last night, thinks he may have appendicitis.  Denies any nausea, vomiting, diarrhea or fever. Last bm this am, reports as normal.  Also having freq. Urination for 6-7 weeks.

## 2012-11-13 NOTE — ED Provider Notes (Signed)
CSN: [id number]     Arrival date & time 11/13/12  4098 History  This chart was scribed for Thomas Kras, MD by Bennett Scrape, ED Scribe. This patient was seen in room APA07/APA07 and the patient's care was started at 9:09 AM.   Chief Complaint  Patient presents with  . Abdominal Pain    The history is provided by the patient. No language interpreter was used.    HPI Comments: DEVINE KLINGEL is a 79 y.o. male who presents to the Emergency Department complaining of gradual onset, gradually worsening, constant RLQ abdominal pain that radiates to the right lower back that started last night. He denies any modifying factors including eating and movement. He denies any changes in his appetite with the initial onset. He also reports possibly associated mild intermittent dysuria described as burning and frequency for the past 6 to 7 weeks. He denies nausea, emesis, diarrhea and fevers as associated symptoms. He has a h/o AAA repair in 1994 and prostate surgery for prostate CA. He denies an AAA reoccurrence since the repair. He denies having any other h/o chronic abdominal conditions.   Past Medical History  Diagnosis Date  . Diabetes mellitus   . Anginal pain   . Hyperlipemia   . Cancer   . Lung cancer   . Prostate cancer    Past Surgical History  Procedure Laterality Date  . Back surgery    . Tumor removal      from lung  . Prostate surgery    . Cardiac surgery    . Surgery to repair aneurysm     No family history on file. History  Substance Use Topics  . Smoking status: Former Smoker -- 1.00 packs/day for 50 years    Types: Cigarettes    Quit date: 12/10/1993  . Smokeless tobacco: Current User  . Alcohol Use: No    Review of Systems  Constitutional: Negative for fever and appetite change.  Gastrointestinal: Positive for abdominal pain. Negative for nausea, vomiting and diarrhea.  A complete 10 system review of systems was obtained and all systems are negative except as  noted in the HPI and PMH.   Allergies  Review of patient's allergies indicates no known allergies.  Home Medications   Current Outpatient Rx  Name  Route  Sig  Dispense  Refill  . albuterol (PROVENTIL) 4 MG tablet   Oral   Take 4 mg by mouth 3 (three) times daily.         Marland Kitchen ALPRAZolam (XANAX) 1 MG tablet   Oral   Take 1 mg by mouth 3 (three) times daily as needed for anxiety.         Marland Kitchen aspirin EC 81 MG EC tablet   Oral   Take 1 tablet (81 mg total) by mouth daily.   30 tablet   1   . calcium carbonate (TUMS - DOSED IN MG ELEMENTAL CALCIUM) 500 MG chewable tablet   Oral   Chew 1 tablet by mouth daily as needed for heartburn.         . diltiazem (CARDIZEM CD) 240 MG 24 hr capsule   Oral   Take 1 capsule (240 mg total) by mouth every morning.   90 capsule   3   . donepezil (ARICEPT) 5 MG tablet   Oral   Take 5 mg by mouth at bedtime.          . fish oil-omega-3 fatty acids 1000 MG capsule   Oral  Take 1 g by mouth daily.         Marland Kitchen HYDROcodone-acetaminophen (NORCO/VICODIN) 5-325 MG per tablet   Oral   Take 1 tablet by mouth every 6 (six) hours as needed for pain.         Marland Kitchen insulin glargine (LANTUS) 100 UNIT/ML injection   Subcutaneous   Inject 0-30 Units into the skin 2 (two) times daily. Injects according to home sliding scale         . isosorbide mononitrate (IMDUR) 30 MG 24 hr tablet   Oral   Take 1 tablet (30 mg total) by mouth daily.   30 tablet   6   . pravastatin (PRAVACHOL) 80 MG tablet   Oral   Take 1 tablet (80 mg total) by mouth every evening.   30 tablet   11   . cyanocobalamin (,VITAMIN B-12,) 1000 MCG/ML injection   Intramuscular   Inject 1,000 mcg into the muscle every 30 (thirty) days. Receives at Primary MD office         . HYDROcodone-acetaminophen (NORCO) 5-325 MG per tablet   Oral   Take 1 tablet by mouth every 6 (six) hours as needed for pain.   12 tablet   0    Triage Vitals: BP 110/53  Pulse 72  Temp(Src)  97.4 F (36.3 C) (Oral)  Resp 20  Ht 5\' 7"  (1.702 m)  Wt 183 lb (83.008 kg)  BMI 28.66 kg/m2  SpO2 98%  Physical Exam  Nursing note and vitals reviewed. Constitutional: He appears well-developed and well-nourished. No distress.  elderly  HENT:  Head: Normocephalic and atraumatic.  Right Ear: External ear normal.  Left Ear: External ear normal.  Eyes: Conjunctivae are normal. Right eye exhibits no discharge. Left eye exhibits no discharge. No scleral icterus.  Neck: Neck supple. No tracheal deviation present.  Cardiovascular: Normal rate, regular rhythm and intact distal pulses.   Pulmonary/Chest: Effort normal and breath sounds normal. No stridor. No respiratory distress. He has no wheezes. He has no rales.  Abdominal: Soft. Bowel sounds are normal. He exhibits no distension and no mass. There is tenderness (RLQ and RUQ). There is guarding. There is no rebound.  Well-healed midline and RUQ scars  Musculoskeletal: He exhibits no edema and no tenderness.  Neurological: He is alert. He has normal strength. No sensory deficit. Cranial nerve deficit:  no gross defecits noted. He exhibits normal muscle tone. He displays no seizure activity. Coordination normal.  Skin: Skin is warm and dry. No rash noted.  Psychiatric: He has a normal mood and affect.    ED Course  Procedures (including critical care time)  DIAGNOSTIC STUDIES: Oxygen Saturation is 98% on room air, normal by my interpretation.    COORDINATION OF CARE: 9:13 AM-Discussed treatment plan which includes Fast [place] with pt at bedside and pt agreed to plan.  9:23 AM-Brief bedside US performed. No AAA or appendicitis appreciated.  Labs Review Labs Reviewed  GLUCOSE, CAPILLARY - Abnormal; Notable for the following:    Glucose-Capillary 126 (*)    All other components within normal limits  COMPREHENSIVE METABOLIC PANEL - Abnormal; Notable for the following:    Potassium 3.4 (*)    Glucose, Bld 137 (*)    GFR calc non Af Amer  63 (*)    GFR calc Af Amer 73 (*)    All other components within normal limits  URINALYSIS, ROUTINE W REFLEX MICROSCOPIC - Abnormal; Notable for the following:    Hgb urine dipstick TRACE (*)  All other components within normal limits  LIPASE, BLOOD  CBC WITH DIFFERENTIAL  PROTIME-INR  URINE MICROSCOPIC-ADD ON  TYPE AND SCREEN   Imaging Review Ct Abdomen Pelvis W Contrast  11/13/2012   CLINICAL DATA:  Right lower quadrant abdominal pain. History of prostate cancer. History of aneurysm repair.  EXAM: CT ABDOMEN AND PELVIS WITH CONTRAST  TECHNIQUE: Multidetector CT imaging of the abdomen and pelvis was performed using the standard protocol following bolus administration of intravenous contrast.  CONTRAST:  50mL OMNIPAQUE IOHEXOL 300 MG/ML SOLN, OMNIPAQUE IOHEXOL 300 MG/ML SOLN  COMPARISON:  06/18/2009  FINDINGS: Right basilar scarring noted. Mild intrahepatic biliary dilatation, with common bile duct measuring 9 mm in diameter is. Prior cholecystectomy.  Spleen and adrenal glands normal. Multiple right renal cysts including a 6.7 cm right kidney upper pole cyst.  There is thickening/ stranding around the superior mesenteric artery which is probably chronic given the contour on prior CT scan. The celiac trunk appear stenotic if not chronically occluded, and the celiac may be filling by collateral vasculature.  Splenic calcifications suggest old granulomatous disease. Pancreatic calcifications favor chronic calcific pancreatitis.  No pathologic upper abdominal adenopathy is observed. Appendix unremarkable.  There is prominent sigmoid diverticulosis without active diverticulitis identified.  Penile implant noted. Compression fracture at L1 with vertebral augmentation. There is mild chronic posterior subluxation at L5-S1 with fused facet joints. The prior lymph node dissection in the pelvis noted. No recurrent mass below the urinary bladder. No pathologic pelvic adenopathy is observed.  IMPRESSION:  1. A specific cause for right lower quadrant abdominal pain not observed. 2. Sigmoid diverticulosis without active diverticulitis. 3. Right renal cysts. 4. Mild biliary dilatation, chronicity uncertain, but potentially a physiologic response to cholecystectomy. 5. 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 Chronic appearing soft tissue density around the superior mesenteric artery, query chronic inflammation or postoperative scarring. 6. Chronic calcific pancreatitis without active pancreatitis identified.   Electronically Signed   By: Herbie Baltimore M.D.   On: 11/13/2012 11:46    EKG Interpretation   None       MDM   1. Abdominal pain    The patient's CT scan is unremarkable. He does not have any evidence of acute surgical condition on the CT scan. Patient's laboratory tests are unremarkable. The patient improved with medications here in the emergency department.  At this time there does not appear to be any evidence of an acute emergency medical condition and the patient appears stable for discharge with appropriate outpatient follow up.   I personally performed the services described in this documentation, which was scribed in my presence.  The recorded information has been reviewed and is accurate.   Thomas Kras, MD 11/13/12 [phone]

## 2012-11-13 NOTE — ED Notes (Signed)
Assisted patient to restroom. Patient walked with assistance.

## 2012-12-06 ENCOUNTER — Encounter (HOSPITAL_COMMUNITY): Payer: Self-pay | Admitting: Emergency Medicine

## 2012-12-06 ENCOUNTER — Emergency Department (HOSPITAL_COMMUNITY)
Admission: EM | Admit: 2012-12-06 | Discharge: 2012-12-06 | Disposition: A | Discharge status: Home / Self Care | Payer: Medicare Other | Attending: Emergency Medicine | Admitting: Emergency Medicine

## 2012-12-06 ENCOUNTER — Emergency Department (HOSPITAL_COMMUNITY): Payer: Medicare Other

## 2012-12-06 DIAGNOSIS — Z79899 Other long term (current) drug therapy: Secondary | ICD-10-CM | POA: Insufficient documentation

## 2012-12-06 DIAGNOSIS — Z8546 Personal history of malignant neoplasm of prostate: Secondary | ICD-10-CM | POA: Insufficient documentation

## 2012-12-06 DIAGNOSIS — E785 Hyperlipidemia, unspecified: Secondary | ICD-10-CM | POA: Insufficient documentation

## 2012-12-06 DIAGNOSIS — R079 Chest pain, unspecified: Secondary | ICD-10-CM | POA: Insufficient documentation

## 2012-12-06 DIAGNOSIS — J441 Chronic obstructive pulmonary disease with (acute) exacerbation: Secondary | ICD-10-CM

## 2012-12-06 DIAGNOSIS — M549 Dorsalgia, unspecified: Secondary | ICD-10-CM | POA: Insufficient documentation

## 2012-12-06 DIAGNOSIS — IMO0002 Reserved for concepts with insufficient information to code with codable children: Secondary | ICD-10-CM | POA: Insufficient documentation

## 2012-12-06 DIAGNOSIS — Z85118 Personal history of other malignant neoplasm of bronchus and lung: Secondary | ICD-10-CM | POA: Insufficient documentation

## 2012-12-06 DIAGNOSIS — Z794 Long term (current) use of insulin: Secondary | ICD-10-CM | POA: Insufficient documentation

## 2012-12-06 DIAGNOSIS — E119 Type 2 diabetes mellitus without complications: Secondary | ICD-10-CM | POA: Insufficient documentation

## 2012-12-06 DIAGNOSIS — Z87891 Personal history of nicotine dependence: Secondary | ICD-10-CM | POA: Insufficient documentation

## 2012-12-06 DIAGNOSIS — I209 Angina pectoris, unspecified: Secondary | ICD-10-CM | POA: Insufficient documentation

## 2012-12-06 LAB — CBC WITH DIFFERENTIAL/PLATELET
Basophils Absolute: 0.1 10*3/uL (ref 0.0–0.1)
Basophils Relative: 1 % (ref 0–1)
Eosinophils Absolute: 0.7 10*3/uL (ref 0.0–0.7)
Eosinophils Relative: 11 % — ABNORMAL HIGH (ref 0–5)
HCT: 40.5 % (ref 39.0–52.0)
Hemoglobin: 13.9 g/dL (ref 13.0–17.0)
Lymphocytes Relative: 28 % (ref 12–46)
Lymphs Abs: 1.8 10*3/uL (ref 0.7–4.0)
MCH: 31.7 pg (ref 26.0–34.0)
MCHC: 34.3 g/dL (ref 30.0–36.0)
MCV: 92.3 fL (ref 78.0–100.0)
Monocytes Absolute: 0.6 10*3/uL (ref 0.1–1.0)
Monocytes Relative: 10 % (ref 3–12)
Neutro Abs: 3.2 10*3/uL (ref 1.7–7.7)
Neutrophils Relative %: 50 % (ref 43–77)
Platelets: 155 10*3/uL (ref 150–400)
RBC: 4.39 MIL/uL (ref 4.22–5.81)
RDW: 13.3 % (ref 11.5–15.5)
WBC: 6.4 10*3/uL (ref 4.0–10.5)

## 2012-12-06 LAB — D-DIMER, QUANTITATIVE (NOT AT ARMC): D-Dimer, Quant: 0.47 ug/mL-FEU (ref 0.00–0.48)

## 2012-12-06 LAB — BASIC METABOLIC PANEL
BUN: 14 mg/dL (ref 6–23)
Calcium: 10 mg/dL (ref 8.4–10.5)
GFR calc Af Amer: 73 mL/min — ABNORMAL LOW (ref >90)
GFR calc non Af Amer: 63 mL/min — ABNORMAL LOW (ref >90)
Glucose, Bld: 152 mg/dL — ABNORMAL HIGH (ref 70–99)
Potassium: 4.2 mEq/L (ref 3.5–5.1)

## 2012-12-06 MED ORDER — IPRATROPIUM BROMIDE 0.02 % IN SOLN
0.5000 mg | Freq: Once | RESPIRATORY_TRACT | Status: AC
  Administered 2012-12-06: 0.5 mg via RESPIRATORY_TRACT
  Filled 2012-12-06: qty 2.5

## 2012-12-06 MED ORDER — ALBUTEROL SULFATE HFA 108 (90 BASE) MCG/ACT IN AERS
2.0000 | INHALATION_SPRAY | RESPIRATORY_TRACT | Status: AC
  Administered 2012-12-06: 2 via RESPIRATORY_TRACT
  Filled 2012-12-06: qty 6.7

## 2012-12-06 MED ORDER — PREDNISONE 20 MG PO TABS: 40.0000 mg | ORAL_TABLET | Freq: Every day | ORAL | Status: DC

## 2012-12-06 MED ORDER — ALBUTEROL SULFATE (5 MG/ML) 0.5% IN NEBU
5.0000 mg | INHALATION_SOLUTION | Freq: Once | RESPIRATORY_TRACT | Status: AC
  Administered 2012-12-06: 5 mg via RESPIRATORY_TRACT
  Filled 2012-12-06: qty 1

## 2012-12-06 MED ORDER — PREDNISONE 50 MG PO TABS
60.0000 mg | ORAL_TABLET | Freq: Once | ORAL | Status: AC
  Administered 2012-12-06: 21:00:00 60 mg via ORAL
  Filled 2012-12-06 (×2): qty 1

## 2012-12-06 NOTE — ED Notes (Signed)
Patient ambulated with assist tolerated well. Patient O2 95%

## 2012-12-06 NOTE — ED Notes (Signed)
Pt c/o posterior right rib cage pain and cough that is productive with gray sputum for the past few months

## 2012-12-06 NOTE — ED Provider Notes (Signed)
CSN: [id number]     Arrival date & time 12/06/12  1649 History   First MD Initiated Contact with Patient 12/06/12 1946     Chief Complaint  Patient presents with  . Back Pain  . Cough    HPI Pt was seen at 2000. Per pt, c/o gradual onset and persistence of constant "cough" and right sided lower lateral ribs "pain" for the past 5 months. Pt states he has not called his PMD regarding same. Denies CP/palpitations, no SOB/wheezing, no back pain, no abd pain, no N/V/D, no falls, no rash, no fevers.    Past Medical History  Diagnosis Date  . Diabetes mellitus   . Anginal pain   . Hyperlipemia   . Cancer   . Lung cancer   . Prostate cancer    Past Surgical History  Procedure Laterality Date  . Back surgery    . Tumor removal      from lung  . Prostate surgery    . Cardiac surgery    . Surgery to repair aneurysm      History  Substance Use Topics  . Smoking status: Former Smoker -- 1.00 packs/day for 50 years    Types: Cigarettes    Quit date: 12/10/1993  . Smokeless tobacco: Current User  . Alcohol Use: No    Review of Systems ROS: Statement: All systems negative except as marked or noted in the HPI; Constitutional: Negative for fever and chills. ; ; Eyes: Negative for eye pain, redness and discharge. ; ; ENMT: Negative for ear pain, hoarseness, nasal congestion, sinus pressure and sore throat. ; ; Cardiovascular: Negative for chest pain, palpitations, diaphoresis, dyspnea and peripheral edema. ; ; Respiratory: +cough. Negative for wheezing and stridor. ; ; Gastrointestinal: Negative for nausea, vomiting, diarrhea, abdominal pain, blood in stool, hematemesis, jaundice and rectal bleeding. . ; ; Genitourinary: Negative for dysuria, flank pain and hematuria. ; ; Musculoskeletal: +right ribs pain. Negative for back pain and neck pain. Negative for swelling and trauma.; ; Skin: Negative for pruritus, rash, abrasions, blisters, bruising and skin lesion.; ; Neuro: Negative for headache,  lightheadedness and neck stiffness. Negative for weakness, altered level of consciousness , altered mental status, extremity weakness, paresthesias, involuntary movement, seizure and syncope.      Allergies  Review of patient's allergies indicates no known allergies.  Home Medications   Current Outpatient Rx  Name  Route  Sig  Dispense  Refill  . albuterol (PROVENTIL) 4 MG tablet   Oral   Take 4 mg by mouth 3 (three) times daily.         Marland Kitchen ALPRAZolam (XANAX) 1 MG tablet   Oral   Take 1 mg by mouth 3 (three) times daily as needed for anxiety.         . donepezil (ARICEPT) 5 MG tablet   Oral   Take 5 mg by mouth at bedtime.          . insulin glargine (LANTUS) 100 UNIT/ML injection   Subcutaneous   Inject 0-30 Units into the skin 2 (two) times daily. Injects according to home sliding scale         . pravastatin (PRAVACHOL) 80 MG tablet   Oral   Take 80 mg by mouth daily.         Marland Kitchen aspirin EC 81 MG EC tablet   Oral   Take 1 tablet (81 mg total) by mouth daily.   30 tablet   1   . calcium carbonate (  TUMS - DOSED IN MG ELEMENTAL CALCIUM) 500 MG chewable tablet   Oral   Chew 1 tablet by mouth daily as needed for heartburn.         . cyanocobalamin (,VITAMIN B-12,) 1000 MCG/ML injection   Intramuscular   Inject 1,000 mcg into the muscle every 30 (thirty) days. Receives at Primary MD office         . diltiazem (CARDIZEM CD) 240 MG 24 hr capsule   Oral   Take 1 capsule (240 mg total) by mouth every morning.   90 capsule   3   . fish oil-omega-3 fatty acids 1000 MG capsule   Oral   Take 1 g by mouth daily.         Marland Kitchen HYDROcodone-acetaminophen (NORCO/VICODIN) 5-325 MG per tablet   Oral   Take 1 tablet by mouth every 6 (six) hours as needed for pain.         . isosorbide mononitrate (IMDUR) 30 MG 24 hr tablet   Oral   Take 1 tablet (30 mg total) by mouth daily.   30 tablet   6   . predniSONE (DELTASONE) 20 MG tablet   Oral   Take 2 tablets (40  mg total) by mouth daily.   10 tablet   0    BP 137/64  Pulse 78  Temp(Src) 97.5 F (36.4 C) (Oral)  Resp 18  SpO2 96% Physical Exam 2005: Physical examination:  Nursing notes reviewed; Vital signs and O2 SAT reviewed;  Constitutional: Well developed, Well nourished, Well hydrated, In no acute distress; Head:  Normocephalic, atraumatic; Eyes: EOMI, PERRL, No scleral icterus; ENMT: Mouth and pharynx normal, Mucous membranes moist; Neck: Supple, Full range of motion, No lymphadenopathy; Cardiovascular: Regular rate and rhythm, No gallop; Respiratory: Breath sounds coarse & equal bilaterally, scattered exp wheezes. No audible wheezing. Speaking full sentences with ease, Normal respiratory effort/excursion; Chest: +right lateral lower ribs tender to palp. No rash, no soft tissue crepitus, no deformity, no ecchymosis. Movement normal; Abdomen: Soft, Nontender, Nondistended, Normal bowel sounds; Genitourinary: No CVA tenderness; Spine:  No midline CS, TS, LS tenderness.;; Extremities: Pulses normal, No tenderness, No edema, No calf edema or asymmetry.; Neuro: AA&Ox3, vague historian. Major CN grossly intact. Speech clear. Climbs on and off stretcher easily by himself. Gait steady. No gross focal motor or sensory deficits in extremities.; Skin: Color normal, Warm, Dry.   ED Course  Procedures   EKG Interpretation     Ventricular Rate:  62 PR Interval:  206 QRS Duration: 138 QT Interval:  414 QTC Calculation: 420 R Axis:   -84 Text Interpretation:  Normal sinus rhythm Left axis deviation Right bundle branch block When compared with ECG of 30-Sep-2012 05:14, No significant change was found            MDM  MDM Reviewed: previous chart, nursing note and vitals Reviewed previous: labs and ECG Interpretation: labs, x-ray and ECG   Results for orders placed during the hospital encounter of 12/06/12  CBC WITH DIFFERENTIAL      Result Value Range   WBC 6.4  4.0 - 10.5 K/uL   RBC 4.39   4.22 - 5.81 MIL/uL   Hemoglobin 13.9  13.0 - 17.0 g/dL   HCT 16.1  09.6 - 04.5 %   MCV 92.3  78.0 - 100.0 fL   MCH 31.7  26.0 - 34.0 pg   MCHC 34.3  30.0 - 36.0 g/dL   RDW 40.9  81.1 - 91.4 %  Platelets 155  150 - 400 K/uL   Neutrophils Relative % 50  43 - 77 %   Neutro Abs 3.2  1.7 - 7.7 K/uL   Lymphocytes Relative 28  12 - 46 %   Lymphs Abs 1.8  0.7 - 4.0 K/uL   Monocytes Relative 10  3 - 12 %   Monocytes Absolute 0.6  0.1 - 1.0 K/uL   Eosinophils Relative 11 (*) 0 - 5 %   Eosinophils Absolute 0.7  0.0 - 0.7 K/uL   Basophils Relative 1  0 - 1 %   Basophils Absolute 0.1  0.0 - 0.1 K/uL  BASIC METABOLIC PANEL      Result Value Range   Sodium 138  135 - 145 mEq/L   Potassium 4.2  3.5 - 5.1 mEq/L   Chloride 101  96 - 112 mEq/L   CO2 30  19 - 32 mEq/L   Glucose, Bld 152 (*) 70 - 99 mg/dL   BUN 14  6 - 23 mg/dL   Creatinine, Ser 6.21  0.50 - 1.35 mg/dL   Calcium 30.8  8.4 - 65.7 mg/dL   GFR calc non Af Amer 63 (*) >90 mL/min   GFR calc Af Amer 73 (*) >90 mL/min  TROPONIN I      Result Value Range   Troponin I <0.30  <0.30 ng/mL  D-DIMER, QUANTITATIVE      Result Value Range   D-Dimer, Quant 0.47  0.00 - 0.48 ug/mL-FEU   Dg Chest 2 View 12/06/2012   CLINICAL DATA:  Cough. Right-sided chest and back pain. Emphysema. Coronary artery disease.  EXAM: CHEST  2 VIEW  COMPARISON:  10/16/2012  FINDINGS: Postop changes from prior right thoracotomy remains stable including extensive pleural-parenchymal scarring at right lung base. Pulmonary emphysema again demonstrated. No evidence of acute infiltrate or pulmonary edema. No evidence of pleural effusion.  Heart size is within normal limits. Hilar and mediastinal contours for are stable. Prior CABG again noted.  IMPRESSION: Stable COPD and postop changes in the right hemi thorax. No active disease.   Electronically Signed   By: Myles Rosenthal M.D.   On: 12/06/2012 17:41     2145:  Pt states he "feels better" after neb and steroid.  NAD, lungs  CTA bilat, no wheezing, resps easy, speaking full sentences, Sats 99% R/A.  Pt ambulated around the ED with Sats remaining 95 % R/A, resps easy, NAD, denies CP/SOB. Abd remains benign. Pt has gotten himself dressed and wants to go home now. States he already has called for his ride. Dx and testing d/w pt.  Questions answered.  Verb understanding, agreeable to d/c home with outpt f/u.          Laray Anger, DO 12/09/12 1310

## 2012-12-06 NOTE — Progress Notes (Signed)
PT had ride waiting would not wait for instruction on spacer; only took inhaler.

## 2012-12-29 ENCOUNTER — Ambulatory Visit (HOSPITAL_COMMUNITY)
Admission: RE | Admit: 2012-12-29 | Discharge: 2012-12-29 | Disposition: A | Discharge status: Home / Self Care | Payer: Medicare Other | Source: Ambulatory Visit | Attending: Family Medicine | Admitting: Family Medicine

## 2012-12-29 ENCOUNTER — Other Ambulatory Visit (HOSPITAL_COMMUNITY): Payer: Self-pay | Admitting: Family Medicine

## 2012-12-29 DIAGNOSIS — Z87891 Personal history of nicotine dependence: Secondary | ICD-10-CM | POA: Insufficient documentation

## 2012-12-29 DIAGNOSIS — R059 Cough, unspecified: Secondary | ICD-10-CM | POA: Insufficient documentation

## 2012-12-29 DIAGNOSIS — J209 Acute bronchitis, unspecified: Secondary | ICD-10-CM

## 2012-12-29 DIAGNOSIS — R05 Cough: Secondary | ICD-10-CM

## 2012-12-29 DIAGNOSIS — R079 Chest pain, unspecified: Secondary | ICD-10-CM | POA: Insufficient documentation

## 2012-12-29 DIAGNOSIS — Z8546 Personal history of malignant neoplasm of prostate: Secondary | ICD-10-CM | POA: Insufficient documentation

## 2012-12-29 DIAGNOSIS — E119 Type 2 diabetes mellitus without complications: Secondary | ICD-10-CM | POA: Insufficient documentation

## 2012-12-29 DIAGNOSIS — Z85118 Personal history of other malignant neoplasm of bronchus and lung: Secondary | ICD-10-CM | POA: Insufficient documentation

## 2013-01-19 ENCOUNTER — Other Ambulatory Visit (HOSPITAL_COMMUNITY): Payer: Self-pay | Admitting: Urology

## 2013-01-19 DIAGNOSIS — C61 Malignant neoplasm of prostate: Secondary | ICD-10-CM

## 2013-01-21 ENCOUNTER — Ambulatory Visit (HOSPITAL_COMMUNITY)
Admission: RE | Admit: 2013-01-21 | Discharge: 2013-01-21 | Disposition: A | Discharge status: Home / Self Care | Payer: Medicare Other | Source: Ambulatory Visit | Attending: Urology | Admitting: Urology

## 2013-01-21 DIAGNOSIS — I7090 Unspecified atherosclerosis: Secondary | ICD-10-CM | POA: Insufficient documentation

## 2013-01-21 DIAGNOSIS — Q6101 Congenital single renal cyst: Secondary | ICD-10-CM | POA: Insufficient documentation

## 2013-01-21 DIAGNOSIS — K5730 Diverticulosis of large intestine without perforation or abscess without bleeding: Secondary | ICD-10-CM | POA: Insufficient documentation

## 2013-01-21 DIAGNOSIS — C61 Malignant neoplasm of prostate: Secondary | ICD-10-CM | POA: Insufficient documentation

## 2013-01-21 MED ORDER — IOHEXOL 300 MG/ML  SOLN
125.0000 mL | Freq: Once | INTRAMUSCULAR | Status: AC | PRN
  Administered 2013-01-21: 125 mL via INTRAVENOUS

## 2013-02-15 ENCOUNTER — Other Ambulatory Visit: Payer: Self-pay | Admitting: *Deleted

## 2013-02-15 DIAGNOSIS — J984 Other disorders of lung: Secondary | ICD-10-CM

## 2013-02-15 DIAGNOSIS — I2510 Atherosclerotic heart disease of native coronary artery without angina pectoris: Secondary | ICD-10-CM

## 2013-02-16 ENCOUNTER — Ambulatory Visit: Payer: Medicare Other | Admitting: Surgery

## 2013-02-17 ENCOUNTER — Ambulatory Visit: Payer: Medicare Other | Admitting: Surgery

## 2013-03-10 ENCOUNTER — Ambulatory Visit
Admission: RE | Admit: 2013-03-10 | Discharge: 2013-03-10 | Disposition: A | Discharge status: Home / Self Care | Payer: Medicare Other | Source: Ambulatory Visit | Attending: Surgery | Admitting: Surgery

## 2013-03-10 ENCOUNTER — Other Ambulatory Visit: Payer: Self-pay

## 2013-03-10 ENCOUNTER — Ambulatory Visit (INDEPENDENT_AMBULATORY_CARE_PROVIDER_SITE_OTHER): Payer: Medicare Other | Admitting: Surgery

## 2013-03-10 ENCOUNTER — Encounter: Payer: Self-pay | Admitting: Surgery

## 2013-03-10 VITALS — BP 125/67 | HR 78 | Resp 20 | Ht 71.0 in | Wt 183.0 lb

## 2013-03-10 DIAGNOSIS — D381 Neoplasm of uncertain behavior of trachea, bronchus and lung: Secondary | ICD-10-CM

## 2013-03-10 DIAGNOSIS — Z85118 Personal history of other malignant neoplasm of bronchus and lung: Secondary | ICD-10-CM

## 2013-03-10 DIAGNOSIS — Z902 Acquired absence of lung [part of]: Secondary | ICD-10-CM

## 2013-03-10 DIAGNOSIS — I2510 Atherosclerotic heart disease of native coronary artery without angina pectoris: Secondary | ICD-10-CM

## 2013-03-10 DIAGNOSIS — Z9889 Other specified postprocedural states: Secondary | ICD-10-CM

## 2013-03-10 DIAGNOSIS — J984 Other disorders of lung: Secondary | ICD-10-CM
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
## 2013-05-17 ENCOUNTER — Other Ambulatory Visit: Payer: Self-pay | Admitting: Adult Health

## 2013-05-17 ENCOUNTER — Other Ambulatory Visit: Payer: Self-pay | Admitting: Cardiology

## 2013-05-21 ENCOUNTER — Other Ambulatory Visit (HOSPITAL_COMMUNITY): Payer: Self-pay | Admitting: Family Medicine

## 2013-05-21 ENCOUNTER — Ambulatory Visit (HOSPITAL_COMMUNITY)
Admission: RE | Admit: 2013-05-21 | Discharge: 2013-05-21 | Disposition: A | Discharge status: Home / Self Care | Payer: Medicare Other | Source: Ambulatory Visit | Attending: Family Medicine | Admitting: Family Medicine

## 2013-05-21 DIAGNOSIS — W19XXXA Unspecified fall, initial encounter: Secondary | ICD-10-CM | POA: Insufficient documentation

## 2013-05-21 DIAGNOSIS — R0789 Other chest pain: Secondary | ICD-10-CM | POA: Insufficient documentation

## 2013-05-21 DIAGNOSIS — S2239XA Fracture of one rib, unspecified side, initial encounter for closed fracture: Secondary | ICD-10-CM

## 2013-06-23 ENCOUNTER — Ambulatory Visit (HOSPITAL_COMMUNITY)
Admission: RE | Admit: 2013-06-23 | Discharge: 2013-06-23 | Disposition: A | Discharge status: Home / Self Care | Payer: Medicare Other | Source: Ambulatory Visit | Attending: Family Medicine | Admitting: Family Medicine

## 2013-06-23 ENCOUNTER — Other Ambulatory Visit (HOSPITAL_COMMUNITY): Payer: Self-pay | Admitting: Family Medicine

## 2013-06-23 DIAGNOSIS — Z4789 Encounter for other orthopedic aftercare: Secondary | ICD-10-CM | POA: Insufficient documentation

## 2013-06-23 DIAGNOSIS — S2239XA Fracture of one rib, unspecified side, initial encounter for closed fracture: Secondary | ICD-10-CM

## 2013-06-23 DIAGNOSIS — Z951 Presence of aortocoronary bypass graft: Secondary | ICD-10-CM | POA: Insufficient documentation

## 2013-06-23 DIAGNOSIS — I700 Atherosclerosis of aorta: Secondary | ICD-10-CM | POA: Insufficient documentation

## 2013-07-07 ENCOUNTER — Ambulatory Visit (HOSPITAL_COMMUNITY)
Admission: RE | Admit: 2013-07-07 | Discharge: 2013-07-07 | Disposition: A | Discharge status: Home / Self Care | Payer: Medicare Other | Source: Ambulatory Visit | Attending: Family Medicine | Admitting: Family Medicine

## 2013-07-07 ENCOUNTER — Other Ambulatory Visit (HOSPITAL_COMMUNITY): Payer: Self-pay | Admitting: Family Medicine

## 2013-07-07 DIAGNOSIS — R079 Chest pain, unspecified: Secondary | ICD-10-CM

## 2013-10-14 ENCOUNTER — Ambulatory Visit (INDEPENDENT_AMBULATORY_CARE_PROVIDER_SITE_OTHER): Payer: Medicare Other | Admitting: Cardiology

## 2013-10-14 ENCOUNTER — Encounter: Payer: Self-pay | Admitting: Cardiology

## 2013-10-14 VITALS — BP 118/62 | HR 64 | Ht 71.0 in | Wt 190.0 lb

## 2013-10-14 DIAGNOSIS — R079 Chest pain, unspecified: Secondary | ICD-10-CM

## 2013-10-14 DIAGNOSIS — I2510 Atherosclerotic heart disease of native coronary artery without angina pectoris: Secondary | ICD-10-CM

## 2013-10-14 DIAGNOSIS — R002 Palpitations: Secondary | ICD-10-CM

## 2013-10-14 NOTE — Patient Instructions (Addendum)
Your physician recommends that you schedule a follow-up appointment in: 2 weeks with Jory Sims NP   Your physician has recommended you make the following change in your medication:     STOP Toprol XL    Your physician has requested that you have a lexiscan myoview. For further information please visit [email or web address]. Please follow instruction sheet, as given. HOLD IMDUR THE MORNING OF YOUR TEST     Thank you for choosing [facility] !

## 2013-10-14 NOTE — Progress Notes (Signed)
Clinical Summary Mr. Capri is a 80 y.o.male last seen by NP Purcell Nails, this is our first visit together. He is seen for the following medical problems.  1. CAD - prior CABG - last cath 2004 as described below, received stent to LCX.  - echo 09/2012 LVEF 55-60%  - patient is a poor historian, history is somewhat limited - episode of chest pain on Wednesday. Awoke him from sleep. Sharp pain left chest, no other associated symptoms. - Pain lasted a few minutes and then resolved. Last night had some left arm soreness.   2. Palpitations - recent admit to [facility], he states because he felt weak all over. He is not sure of much of the details - records obtained after our appointment. Seen 10/10/13 in [place] ER for rapid heart rate and SOB. Heart rate in ER was 75, no arrhythmias noted. - K 3.5 trop neg x2, Hgb 15   He was started on Toprol XL and discharged.    Past Medical History  Diagnosis Date  . Diabetes mellitus   . Anginal pain   . Hyperlipemia   . Cancer   . Lung cancer   . Prostate cancer      No Known Allergies   Current Outpatient Prescriptions  Medication Sig Dispense Refill  . albuterol (PROVENTIL) 4 MG tablet Take 4 mg by mouth 3 (three) times daily.      Marland Kitchen ALPRAZolam (XANAX) 1 MG tablet Take 1 mg by mouth 3 (three) times daily as needed for anxiety.      Marland Kitchen aspirin EC 81 MG EC tablet Take 1 tablet (81 mg total) by mouth daily.  30 tablet  1  . calcium carbonate (TUMS - DOSED IN MG ELEMENTAL CALCIUM) 500 MG chewable tablet Chew 1 tablet by mouth daily as needed for heartburn.      . cyanocobalamin (,VITAMIN B-12,) 1000 MCG/ML injection Inject 1,000 mcg into the muscle every 30 (thirty) days. Receives at Primary MD office      . diltiazem (CARDIZEM CD) 240 MG 24 hr capsule TAKE ONE CAPSULE ORALLY EVERY MORNING.  30 capsule  6  . donepezil (ARICEPT) 5 MG tablet Take 5 mg by mouth at bedtime.       . fish oil-omega-3 fatty acids 1000 MG capsule Take 1 g by  mouth daily.      Marland Kitchen HYDROcodone-acetaminophen (NORCO/VICODIN) 5-325 MG per tablet Take 1 tablet by mouth every 6 (six) hours as needed for pain.      Marland Kitchen insulin glargine (LANTUS) 100 UNIT/ML injection Inject 0-30 Units into the skin 2 (two) times daily. Injects according to home sliding scale      . isosorbide mononitrate (IMDUR) 30 MG 24 hr tablet TAKE ONE TABLET BY MOUTH ONCE DAILY.  30 tablet  6  . pravastatin (PRAVACHOL) 80 MG tablet TAKE 1 TABLET BY MOUTH DAILY IN THE EVENING.  30 tablet  6  . predniSONE (DELTASONE) 20 MG tablet Take 2 tablets (40 mg total) by mouth daily.  10 tablet  0   No current facility-administered medications for this visit.   Facility-Administered Medications Ordered in Other Visits  Medication Dose Route Frequency Provider Last Rate Last Dose  . albuterol (PROVENTIL) (5 MG/ML) 0.5% nebulizer solution 2.5 mg  2.5 mg Nebulization Once Lendon Colonel, NP         Past Surgical History  Procedure Laterality Date  . Back surgery    . Tumor removal  from lung  . Prostate surgery    . Cardiac surgery    . Surgery to repair aneurysm       No Known Allergies    No family history on file.   Social History Mr. Mirabal reports that he quit smoking about 19 years ago. His smoking use included Cigarettes. He has a 50 pack-year smoking history. He uses smokeless tobacco. Mr. Kohlenberg reports that he does not drink alcohol.   Review of Systems CONSTITUTIONAL: No weight loss, fever, chills, weakness or fatigue.  HEENT: Eyes: No visual loss, blurred vision, double vision or yellow sclerae.No hearing loss, sneezing, congestion, runny nose or sore throat.  SKIN: No rash or itching.  CARDIOVASCULAR: per HPI RESPIRATORY: No shortness of breath, cough or sputum.  GASTROINTESTINAL: No anorexia, nausea, vomiting or diarrhea. No abdominal pain or blood.  GENITOURINARY: No burning on urination, no polyuria NEUROLOGICAL: No headache, dizziness, syncope, paralysis,  ataxia, numbness or tingling in the extremities. No change in bowel or bladder control.  MUSCULOSKELETAL: No muscle, back pain, joint pain or stiffness.  LYMPHATICS: No enlarged nodes. No history of splenectomy.  PSYCHIATRIC: No history of depression or anxiety.  ENDOCRINOLOGIC: No reports of sweating, cold or heat intolerance. No polyuria or polydipsia.  Marland Kitchen   Physical Examination p 64 bp 118/62 Wt 190 lbs BMI 27 Gen: resting comfortably, no acute distress HEENT: no scleral icterus, pupils equal round and reactive, no palptable cervical adenopathy,  CV: RRR, no m/r/g, no JVD,no carotid bruits Resp: Clear to auscultation bilaterally GI: abdomen is soft, non-tender, non-distended, normal bowel sounds, no hepatosplenomegaly MSK: extremities are warm, no edema.  Skin: warm, no rash Neuro:  no focal deficits Psych: appropriate affect   Diagnostic Studies 09/2012 Echo Study Conclusions  - Left ventricle: The cavity size was normal. Wall thickness was normal. Systolic function was normal. The estimated ejection fraction was in the range of 55% to 60%. Wall motion was normal; there were no regional wall motion abnormalities. - Aortic valve: Mildly calcified annulus. Mildly calcified leaflets. - Mitral valve: Calcified annulus. - Atrial septum: No defect or patent foramen ovale was identified.  Cath 08/2002 PROCEDURES:  1. Left heart catheterization.  2. Selective coronary arteriography.  3. Selective left ventriculography.  4. Saphenous vein graft angiography x2.  5. Selective left internal mammary angiography.  DESCRIPTION OF PROCEDURE: The procedure was performed from the right  femoral artery using 6 French catheter. Tolerated the procedure well and  there were no complications. He was taken to the holding area in  satisfactory clinical condition. His wife was not available at the time, so  we deferred any type of intervention.  HEMODYNAMIC DATA:  1. Central aorta: 133/78.    2. Left ventricle: 116/17.  3. No aortic or left ventricular gradient on pullback across the aortic  valve.  ANGIOGRAPHIC DATA:  1. Ventriculography was performed in the RAO projection. Because of  ventricular ectopy, ejection fraction could not be accurately calculated  on sinus beat. However, overall systolic function was preserved. No  segmental wall motion abnormalities were identified.  2. The left main coronary artery was free of critical disease.  3. The LAD was subtotally occluded and then a 70% lesion. There was  competitive filling of the vessel distally.  4. The left internal mammary to the distal LAD appeared to be widely patent.  5. The circumflex provides the first marginal Shelley Pooley. It is free of critical  disease. After this, there is a segmental area of plaquing of  about 50%  overlying the takeoff of the AV circumflex. The AV circumflex itself was  totally occluded. Just beyond this into the marginal was a 90% stenosis  supplying both marginal branches.  6. The saphenous vein graft to the OM has about a long 90% stenosis in it.  This supplies the AV circumflex distally.  7. The right coronary artery is totally occluded.  8. The saphenous vein graft to the distal right is intact. There is a  posterolateral Elanor Cale with long 80% stenosis.  CONCLUSION:  1. Preserved left ventricular function.  2. Continued patency of internal mammary of the left anterior descending.  3. Continued patency of the saphenous vein graft to the right with distal  posterior lateral disease as described.  4. High grade stenosis of a large marginal Macedonio Scallon.  5. High grade stenosis of the vein graft to the posterior lateral Christon Gallaway.  DISPOSITION: We plan to review options. My recommendation would be to go  ahead and open the circumflex marginal and then in a staged manner open up  the saphenous vein graft with a second sitting because of the potential for  procedural infarction. This will be  discussed with the patient and his wife  as soon as she arrives.     Assessment and Plan   1. CAD - episodes of generalized fatigue and chest pain unclear etiology - strong CAD history, will obtain Lexiscan MPI to evalute for ischemia  2. Palpitations - patient is very poor historian, he does not remember why he went to [facility] ER last week other than feeling week - from there records he described SOB and palpitations, no arrhythmias noted during there evaluation - continue to follow for now, stop Toprol XL as he is already on long acting dilt  F/u in 2 weeks     Arnoldo Lenis, M.D., F.A.C.C.

## 2013-10-15 ENCOUNTER — Encounter: Payer: Self-pay | Admitting: Cardiology

## 2013-10-20 ENCOUNTER — Telehealth: Payer: Self-pay | Admitting: *Deleted

## 2013-10-20 NOTE — Telephone Encounter (Signed)
Will forward to Dr. Branch. 

## 2013-10-20 NOTE — Telephone Encounter (Signed)
This is Dr. Nelly Laurence patient.

## 2013-10-20 NOTE — Telephone Encounter (Signed)
FYI: pt wanted Dr. Bronson Ing to know he has been coughing with mucus and when he does this, his heart rate goes up. Will forward to Dr. Bronson Ing. Pt will be seen 9/24

## 2013-10-20 NOTE — Telephone Encounter (Signed)
Pt forgot to tell you about all the mucus he coughs up everyday.

## 2013-10-21 NOTE — Telephone Encounter (Signed)
Relayed message to pt,states he "may" call pcp. I called [facility] myself and was told Dr.Fusco would see him now.pt agrees to go

## 2013-10-21 NOTE — Telephone Encounter (Signed)
A productive cough would be best worked up by his primary. It is normal to have the feeling of heart racing after coughing spells   Zandra Abts MD

## 2013-10-25 ENCOUNTER — Encounter (HOSPITAL_COMMUNITY)
Admission: RE | Admit: 2013-10-25 | Discharge: 2013-10-25 | Disposition: A | Discharge status: Home / Self Care | Payer: Medicare Other | Source: Ambulatory Visit | Attending: Cardiology | Admitting: Cardiology

## 2013-10-25 ENCOUNTER — Ambulatory Visit (HOSPITAL_COMMUNITY)
Admission: RE | Admit: 2013-10-25 | Discharge: 2013-10-25 | Disposition: A | Discharge status: Home / Self Care | Payer: Medicare Other | Source: Ambulatory Visit | Attending: Cardiology | Admitting: Cardiology

## 2013-10-25 ENCOUNTER — Encounter (HOSPITAL_COMMUNITY): Payer: Self-pay

## 2013-10-25 DIAGNOSIS — R079 Chest pain, unspecified: Secondary | ICD-10-CM | POA: Diagnosis not present

## 2013-10-25 MED ORDER — SODIUM CHLORIDE 0.9 % IJ SOLN
10.0000 mL | INTRAMUSCULAR | Status: DC | PRN
  Administered 2013-10-25: 10 mL via INTRAVENOUS

## 2013-10-25 MED ORDER — REGADENOSON 0.4 MG/5ML IV SOLN
INTRAVENOUS | Status: AC
  Administered 2013-10-25: 0.4 mg via INTRAVENOUS
  Filled 2013-10-25: qty 5

## 2013-10-25 MED ORDER — REGADENOSON 0.4 MG/5ML IV SOLN
0.4000 mg | Freq: Once | INTRAVENOUS | Status: AC
  Administered 2013-10-25: 0.4 mg via INTRAVENOUS

## 2013-10-25 MED ORDER — TECHNETIUM TC 99M SESTAMIBI GENERIC - CARDIOLITE
10.0000 | Freq: Once | INTRAVENOUS | Status: AC | PRN
  Administered 2013-10-25: 10 via INTRAVENOUS

## 2013-10-25 MED ORDER — SODIUM CHLORIDE 0.9 % IJ SOLN
INTRAMUSCULAR | Status: AC
  Filled 2013-10-25: qty 36

## 2013-10-25 MED ORDER — TECHNETIUM TC 99M SESTAMIBI - CARDIOLITE
30.0000 | Freq: Once | INTRAVENOUS | Status: AC | PRN
  Administered 2013-10-25: 11:00:00 30 via INTRAVENOUS

## 2013-10-25 MED ORDER — SODIUM CHLORIDE 0.9 % IJ SOLN
INTRAMUSCULAR | Status: AC
  Administered 2013-10-25: 10 mL via INTRAVENOUS
  Filled 2013-10-25: qty 10

## 2013-10-25 NOTE — Progress Notes (Signed)
Stress Lab Nurses Notes - Thomas [name]  Thomas [name] 10/25/2013 Reason for doing test: Chest Pain Type of test: Leane Call Nurse performing test: Boneta Lucks Rn Nuclear Medicine Tech: Thornton Papas Tech: Not Applicable MD performing test: Jory Sims Family MD:  Test explained and consent signed: Yes.   IV started: Saline lock started in radiology Symptoms:  Mild light headed Treatment/Intervention: None Reason test stopped: protocol completed After recovery IV was: Discontinued via X-ray tech and Saline Lock flushed Patient to return to [place] [place]. Med at : Patient discharged: Home Patient's Condition upon discharge was: stable Comments: prettest BP 137/76 HR56, Peak BP 132/62  HR 74, Symptoms resolved  [name], Wells Guiles

## 2013-10-28 ENCOUNTER — Encounter: Payer: Self-pay | Admitting: Adult Health

## 2013-10-28 ENCOUNTER — Ambulatory Visit (INDEPENDENT_AMBULATORY_CARE_PROVIDER_SITE_OTHER): Payer: Medicare Other | Admitting: Adult Health

## 2013-10-28 VITALS — BP 118/58 | HR 45 | Ht 71.0 in | Wt 186.0 lb

## 2013-10-28 DIAGNOSIS — J4489 Other specified chronic obstructive pulmonary disease: Secondary | ICD-10-CM

## 2013-10-28 DIAGNOSIS — R079 Chest pain, unspecified: Secondary | ICD-10-CM

## 2013-10-28 DIAGNOSIS — J449 Chronic obstructive pulmonary disease, unspecified: Secondary | ICD-10-CM

## 2013-10-28 DIAGNOSIS — E785 Hyperlipidemia, unspecified: Secondary | ICD-10-CM

## 2013-10-28 NOTE — Assessment & Plan Note (Signed)
Resolved. No changes in medicine or further testing.

## 2013-10-28 NOTE — Assessment & Plan Note (Signed)
Continue current medication regimen, low cholesterol diet. We will see him in 6 months.

## 2013-10-28 NOTE — Patient Instructions (Signed)
Your physician wants you to follow-up in: 6 months You will receive a reminder letter in the mail two months in advance. If you don't receive a letter, please call our office to schedule the follow-up appointment.     Your physician recommends that you continue on your current medications as directed. Please refer to the Current Medication list given to you today.      Thank you for choosing [facility] !        

## 2013-10-28 NOTE — Assessment & Plan Note (Signed)
He continues to have some issues with coughing at night before bed. I have asked him to speak with his primary care physician about this. If symptoms persist.

## 2013-10-28 NOTE — Progress Notes (Signed)
HPI: Thomas [name] is an 80 year old male patient of Dr. Harl Bowie that we follow for ongoing assessment and management of CAD with a CABG in the past, who was last in the office on 10/14/2013 with complaints of chest pain, which awoke him from sleep described as sharp. The patient had a followup nuclear medicine stress test, revealing no evidence of reversal ischemia or clear evidence of prior infarction with normal LV systolic function dated 96/78/9381. This is overall low risk study.   He comes today  without any further complaints with the exception of some chest congestion, which usually occurs at night with some frequent coughing. He has been seen by his primary care physician and placed on medication for this. He has not had any further discomfort in his chest.   No Known Allergies  Current Outpatient Prescriptions  Medication Sig Dispense Refill  . albuterol (PROVENTIL) 4 MG tablet Take 4 mg by mouth 3 (three) times daily.      Marland Kitchen ALPRAZolam (XANAX) 1 MG tablet Take 1 mg by mouth 3 (three) times daily as needed for anxiety.      Marland Kitchen aspirin EC 81 MG EC tablet Take 1 tablet (81 mg total) by mouth daily.  30 tablet  1  . calcium carbonate (TUMS - DOSED IN MG ELEMENTAL CALCIUM) 500 MG chewable tablet Chew 1 tablet by mouth daily as needed for heartburn.      . cyanocobalamin (,VITAMIN B-12,) 1000 MCG/ML injection Inject 1,000 mcg into the muscle every 30 (thirty) days. Receives at Primary MD office      . diltiazem (CARDIZEM CD) 240 MG 24 hr capsule TAKE ONE CAPSULE ORALLY EVERY MORNING.  30 capsule  6  . donepezil (ARICEPT) 5 MG tablet Take 5 mg by mouth at bedtime.       . fish oil-omega-3 fatty acids 1000 MG capsule Take 1 g by mouth daily.      Marland Kitchen HYDROcodone-acetaminophen (NORCO/VICODIN) 5-325 MG per tablet Take 1 tablet by mouth every 6 (six) hours as needed for pain.      Marland Kitchen insulin glargine (LANTUS) 100 UNIT/ML injection Inject 0-30 Units into the skin 2 (two) times daily. Injects according  to home sliding scale      . isosorbide mononitrate (IMDUR) 30 MG 24 hr tablet TAKE ONE TABLET BY MOUTH ONCE DAILY.  30 tablet  6  . ONE TOUCH ULTRA TEST test strip       . pravastatin (PRAVACHOL) 80 MG tablet TAKE 1 TABLET BY MOUTH DAILY IN THE EVENING.  30 tablet  6  . predniSONE (DELTASONE) 20 MG tablet Take 2 tablets (40 mg total) by mouth daily.  10 tablet  0   No current facility-administered medications for this visit.   Facility-Administered Medications Ordered in Other Visits  Medication Dose Route Frequency Provider Last Rate Last Dose  . albuterol (PROVENTIL) (5 MG/ML) 0.5% nebulizer solution 2.5 mg  2.5 mg Nebulization Once Lendon Colonel, NP        Past Medical History  Diagnosis Date  . Diabetes mellitus   . Anginal pain   . Hyperlipemia   . Cancer   . Lung cancer   . Prostate cancer     Past Surgical History  Procedure Laterality Date  . Back surgery    . Tumor removal      from lung  . Prostate surgery    . Cardiac surgery    . Surgery to repair aneurysm      ROS:  Review of systems complete and found to be negative unless listed above  PHYSICAL EXAM BP 118/58  Pulse 45  Ht 5\' 11"  (1.803 m)  Wt 186 lb (84.369 kg)  BMI 25.95 kg/m2 General: Well developed, well nourished, in no acute distress Head: Eyes PERRLA, No xanthomas.   Normal cephalic and atramatic  Lungs: Clear bilaterally to auscultation and percussion. Heart: HRRR S1 S2, without MRG.  Pulses are 2+ & equal.            No carotid bruit. No JVD.  No abdominal bruits. No femoral bruits. Abdomen: Bowel sounds are positive, abdomen soft and non-tender without masses or                  Hernia's noted. Msk:  Back normal, normal gait. Normal strength and tone for age. Extremities: No clubbing, cyanosis or edema.  DP +1 Neuro: Alert and oriented X 3. Psych:  Good affect, responds appropriately     ASSESSMENT AND PLAN

## 2013-10-28 NOTE — Assessment & Plan Note (Signed)
I have discussed with him the results of the stress test, which were found to be very low risk study without evidence of new ischemia. The patient will continue his current medication regimen without changes. We will see him again in 6 months. He verbalizes understanding.

## 2013-10-28 NOTE — Progress Notes (Deleted)
Name: Thomas [name]    DOB: Sep 03, 1927  Age: 80 y.o.  MR#: [id number]       PCP:  Glo Herring., MD      Insurance: Payor: [employer] MEDICARE / Plan: AARP MEDICARE COMPLETE / Product Type: *No Product type* /   CC:    Chief Complaint  Patient presents with  . Coronary Artery Disease    VS Filed Vitals:   10/28/13 1334  BP: 118/58  Pulse: 45  Height: 5\' 11"  (1.803 m)  Weight: 186 lb (84.369 kg)    Weights Current Weight  10/28/13 186 lb (84.369 kg)  10/14/13 190 lb (86.183 kg)  03/10/13 183 lb (83.008 kg)    Blood Pressure  BP Readings from Last 3 Encounters:  10/28/13 118/58  10/14/13 118/62  03/10/13 125/67     Admit date:  (Not on file) Last encounter with RMR:  05/17/2013   Allergy Review of patient's allergies indicates no known allergies.  Current Outpatient Prescriptions  Medication Sig Dispense Refill  . albuterol (PROVENTIL) 4 MG tablet Take 4 mg by mouth 3 (three) times daily.      Marland Kitchen ALPRAZolam (XANAX) 1 MG tablet Take 1 mg by mouth 3 (three) times daily as needed for anxiety.      Marland Kitchen aspirin EC 81 MG EC tablet Take 1 tablet (81 mg total) by mouth daily.  30 tablet  1  . calcium carbonate (TUMS - DOSED IN MG ELEMENTAL CALCIUM) 500 MG chewable tablet Chew 1 tablet by mouth daily as needed for heartburn.      . cyanocobalamin (,VITAMIN B-12,) 1000 MCG/ML injection Inject 1,000 mcg into the muscle every 30 (thirty) days. Receives at Primary MD office      . diltiazem (CARDIZEM CD) 240 MG 24 hr capsule TAKE ONE CAPSULE ORALLY EVERY MORNING.  30 capsule  6  . donepezil (ARICEPT) 5 MG tablet Take 5 mg by mouth at bedtime.       . fish oil-omega-3 fatty acids 1000 MG capsule Take 1 g by mouth daily.      Marland Kitchen HYDROcodone-acetaminophen (NORCO/VICODIN) 5-325 MG per tablet Take 1 tablet by mouth every 6 (six) hours as needed for pain.      Marland Kitchen insulin glargine (LANTUS) 100 UNIT/ML injection Inject 0-30 Units into the skin 2 (two) times daily. Injects according to  home sliding scale      . isosorbide mononitrate (IMDUR) 30 MG 24 hr tablet TAKE ONE TABLET BY MOUTH ONCE DAILY.  30 tablet  6  . ONE TOUCH ULTRA TEST test strip       . pravastatin (PRAVACHOL) 80 MG tablet TAKE 1 TABLET BY MOUTH DAILY IN THE EVENING.  30 tablet  6  . predniSONE (DELTASONE) 20 MG tablet Take 2 tablets (40 mg total) by mouth daily.  10 tablet  0   No current facility-administered medications for this visit.   Facility-Administered Medications Ordered in Other Visits  Medication Dose Route Frequency Provider Last Rate Last Dose  . albuterol (PROVENTIL) (5 MG/ML) 0.5% nebulizer solution 2.5 mg  2.5 mg Nebulization Once Lendon Colonel, NP        Discontinued Meds:   There are no discontinued medications.  Patient Active Problem List   Diagnosis Date Noted  . Abnormal laboratory test result 11/01/2012  . Epigastric pain 09/30/2012  . NSTEMI (non-ST elevated myocardial infarction) 09/30/2012  . Acute renal failure 09/09/2012  . Acute encephalopathy 09/07/2012  . Malignant neoplasm of upper lobe, bronchus or  lung 02/19/2011  . Right bundle branch block and left anterior fascicular block 03/31/2009  . CHEST PAIN-UNSPECIFIED 03/31/2009  . DIABETES MELLITUS, TYPE II 07/18/2008  . HYPERLIPIDEMIA-MIXED 07/18/2008  . CAD, ARTERY BYPASS GRAFT 07/18/2008  . COPD 07/18/2008    LABS    Component Value Date/Time   NA 138 12/06/2012 2029   NA 141 11/13/2012 0950   NA 143 10/02/2012 0452   K 4.2 12/06/2012 2029   K 3.4* 11/13/2012 0950   K 3.4* 10/02/2012 0452   CL 101 12/06/2012 2029   CL 104 11/13/2012 0950   CL 105 10/02/2012 0452   CO2 30 12/06/2012 2029   CO2 28 11/13/2012 0950   CO2 30 10/02/2012 0452   GLUCOSE 152* 12/06/2012 2029   GLUCOSE 137* 11/13/2012 0950   GLUCOSE 83 10/02/2012 0452   BUN 14 12/06/2012 2029   BUN 12 11/13/2012 0950   BUN 13 10/02/2012 0452   CREATININE 1.07 12/06/2012 2029   CREATININE 1.07 11/13/2012 0950   CREATININE 1.10 10/02/2012 0452    CALCIUM 10.0 12/06/2012 2029   CALCIUM 10.0 11/13/2012 0950   CALCIUM 9.6 10/02/2012 0452   GFRNONAA 63* 12/06/2012 2029   GFRNONAA 63* 11/13/2012 0950   GFRNONAA 61* 10/02/2012 0452   GFRAA 73* 12/06/2012 2029   GFRAA 73* 11/13/2012 0950   GFRAA 70* 10/02/2012 0452   CMP     Component Value Date/Time   NA 138 12/06/2012 2029   K 4.2 12/06/2012 2029   CL 101 12/06/2012 2029   CO2 30 12/06/2012 2029   GLUCOSE 152* 12/06/2012 2029   BUN 14 12/06/2012 2029   CREATININE 1.07 12/06/2012 2029   CALCIUM 10.0 12/06/2012 2029   PROT 7.3 11/13/2012 0950   ALBUMIN 3.7 11/13/2012 0950   AST 22 11/13/2012 0950   ALT 13 11/13/2012 0950   ALKPHOS 55 11/13/2012 0950   BILITOT 0.8 11/13/2012 0950   GFRNONAA 63* 12/06/2012 2029   GFRAA 73* 12/06/2012 2029       Component Value Date/Time   WBC 6.4 12/06/2012 2029   WBC 7.1 11/13/2012 0950   WBC 6.2 10/02/2012 0452   HGB 13.9 12/06/2012 2029   HGB 14.4 11/13/2012 0950   HGB 13.4 10/02/2012 0452   HCT 40.5 12/06/2012 2029   HCT 41.4 11/13/2012 0950   HCT 39.7 10/02/2012 0452   MCV 92.3 12/06/2012 2029   MCV 92.0 11/13/2012 0950   MCV 93.2 10/02/2012 0452    Lipid Panel     Component Value Date/Time   CHOL 93 10/01/2012 0931   TRIG 60 10/01/2012 0931   HDL 55 10/01/2012 0931   CHOLHDL 1.7 10/01/2012 0931   VLDL 12 10/01/2012 0931   LDLCALC 26 10/01/2012 0931    ABG    Component Value Date/Time   PHART 7.298* 12/15/2006 0600   PCO2ART 49.4* 12/15/2006 0600   PO2ART 84.8 12/15/2006 0600   HCO3 25.3* 01/24/2007 2121   TCO2 25 12/20/2010 2340   ACIDBASEDEF 1.0 01/24/2007 2121   O2SAT 96.4 12/15/2006 0600     Lab Results  Component Value Date   TSH 0.784 09/30/2012   BNP (last 3 results) No results found for this basename: PROBNP,  in the last 8760 hours Cardiac Panel (last 3 results) No results found for this basename: CKTOTAL, CKMB, TROPONINI, RELINDX,  in the last 72 hours  Iron/TIBC/Ferritin/ %Sat No results found for this basename: iron,  tibc, ferritin, ironpctsat     EKG Orders placed in visit on 10/15/13  .  EKG 12-LEAD     Prior Assessment and Plan Problem List as of 10/28/2013     Cardiovascular and Mediastinum   CAD, ARTERY BYPASS GRAFT   Last Assessment & Plan   10/16/2012 Office Visit Written 10/16/2012  1:17 PM by Lendon Colonel, NP     He was to be managed medically according to prior admission notes. He is not actually having chest pain. It was more dyspnea on exertion and weakness He has some wheezes and crackles on lung exam. He has responded to 1 NTG tablet SL and rest.   I will d/c coreg as this may be causing bronchospasms. I will start Isosorbide, 30 mg daily.     Right bundle branch block and left anterior fascicular block   Last Assessment & Plan   03/29/2011 Office Visit Written 03/29/2011  2:19 PM by Renella Cunas, MD     Unchanged.    NSTEMI (non-ST elevated myocardial infarction)     Respiratory   COPD   Last Assessment & Plan   10/16/2012 Office Visit Edited 10/16/2012  2:41 PM by Lendon Colonel, NP     Review of home medications has him on albuterol treatments TID. He states he is not using these medications and is not aware he should be on this. I have called respiratory to provide a breathing treatment.   I will have CXR today with BMET and Troponin today. I will check with family who is on their way, to ask about home breathing treatments.   After talking with his family, they state that his dementia is worsening and they are considering inpt [facility] for this. He is feeling much better after breathing treatment and should continue to be compliant with this. Family to come with him on future appts.    Malignant neoplasm of upper lobe, bronchus or lung   Last Assessment & Plan   02/19/2011 Office Visit Written 02/19/2011 11:56 AM by Gaye Pollack, MD     S/p right upper lobectomy and right lower lobe superior segmentectomy 09/05/1999 for T2, N0, M0 squamous cell carcinoma.      Endocrine    DIABETES MELLITUS, TYPE II     Nervous and Auditory   Acute encephalopathy     Genitourinary   Acute renal failure     Other   HYPERLIPIDEMIA-MIXED   Last Assessment & Plan   03/25/2012 Office Visit Written 03/25/2012  5:05 PM by Renella Cunas, MD     Patient is running out of his pravastatin. Prescription redone. Annual fasting lipid profile and LFTs.    CHEST PAIN-UNSPECIFIED   Last Assessment & Plan   03/25/2012 Office Visit Written 03/25/2012  5:04 PM by Renella Cunas, MD     Sharp stabbing well localized discomfort noncardiac. Patient reassured.    Epigastric pain   Abnormal laboratory test result       Imaging: Nm Myocar Multi W/spect W/wall Motion / Ef  10/25/2013   CLINICAL DATA:  80 year old male with a history of coronary artery disease referred for chest pain.  EXAM: MYOCARDIAL IMAGING WITH SPECT (REST AND PHARMACOLOGIC-STRESS)  GATED LEFT VENTRICULAR WALL MOTION STUDY  LEFT VENTRICULAR EJECTION FRACTION  TECHNIQUE: Standard myocardial SPECT imaging was performed after resting intravenous injection of 10 mCi Tc-23m sestamibi. Subsequently, intravenous infusion of Lexiscan was performed under the supervision of the Cardiology staff. At peak effect of the drug, 30 mCi Tc-61m sestamibi was injected intravenously and standard myocardial SPECT imaging was performed. Quantitative gated  imaging was also performed to evaluate left ventricular wall motion, and estimate left ventricular ejection fraction.  COMPARISON:  None.  FINDINGS: Pharmacological stress  Baseline EKG shows sinus rhythm with right bundle branch block. After injection heart rate increased from [place] 56 beats per min up to 77 beats per min and blood pressure decreased from 137/76 down to 132/62. The test was stopped after injection was complete, the patient did not experience any chest pain. Post-injection EKG in showed no specific ischemic changes and no significant arrhythmias. There were occasional PVCs.  Perfusion: There  is a large fixed moderate intensity inferior wall defect. The inferior wall has normal wall motion. The combination of findings is most consistent with sub- diaphragmatic attenuation, there is also noted significant radiotracer uptake in the adjacent gut to the inferior wall as well which may contribute to artifact. Cannot rule out possible old inferior wall infarct, however with normal wall motion this is less likely.  Wall Motion: Normal left ventricular wall motion. No left ventricular dilation.  Left Ventricular Ejection Fraction: 68 %  End diastolic volume 89 ml  End systolic volume 28 ml  IMPRESSION: 1. No reversible ischemia or clear evidence of prior infarction.  2. Normal left ventricular wall motion.  3. Left ventricular ejection fraction 68%  4. Low-risk stress test findings*.  *2012 Appropriate Use Criteria for Coronary Revascularization Focused Update: J Am Coll Cardiol. 3329;51(8):841-660. http://content.airportbarriers.com.aspx?articleid=[email or web address]   Electronically Signed   By: Carlyle Dolly   On: 10/25/2013 13:27

## 2013-11-08 ENCOUNTER — Other Ambulatory Visit (HOSPITAL_COMMUNITY): Payer: Self-pay | Admitting: Internal Medicine

## 2013-11-08 DIAGNOSIS — R0789 Other chest pain: Secondary | ICD-10-CM

## 2013-11-15 ENCOUNTER — Ambulatory Visit (HOSPITAL_COMMUNITY)
Admission: RE | Admit: 2013-11-15 | Discharge: 2013-11-15 | Disposition: A | Discharge status: Home / Self Care | Payer: Medicare Other | Source: Ambulatory Visit | Attending: Internal Medicine | Admitting: Internal Medicine

## 2013-11-15 ENCOUNTER — Other Ambulatory Visit (HOSPITAL_COMMUNITY): Payer: Self-pay | Admitting: Internal Medicine

## 2013-11-15 DIAGNOSIS — R079 Chest pain, unspecified: Secondary | ICD-10-CM | POA: Insufficient documentation

## 2013-11-15 DIAGNOSIS — R0789 Other chest pain: Secondary | ICD-10-CM

## 2013-11-15 DIAGNOSIS — R05 Cough: Secondary | ICD-10-CM | POA: Insufficient documentation

## 2013-11-15 DIAGNOSIS — Z85118 Personal history of other malignant neoplasm of bronchus and lung: Secondary | ICD-10-CM | POA: Diagnosis not present

## 2013-11-24 ENCOUNTER — Ambulatory Visit (INDEPENDENT_AMBULATORY_CARE_PROVIDER_SITE_OTHER): Payer: Medicare Other | Admitting: Surgery

## 2013-11-24 ENCOUNTER — Encounter: Payer: Self-pay | Admitting: Surgery

## 2013-11-24 VITALS — BP 116/78 | HR 87 | Resp 16 | Ht 71.0 in | Wt 186.0 lb

## 2013-11-24 DIAGNOSIS — Z85118 Personal history of other malignant neoplasm of bronchus and lung: Secondary | ICD-10-CM

## 2013-11-24 DIAGNOSIS — G8922 Chronic post-thoracotomy pain: Secondary | ICD-10-CM

## 2013-11-25 ENCOUNTER — Encounter: Payer: Self-pay | Admitting: Surgery

## 2013-11-25 NOTE — Progress Notes (Signed)
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 I last saw him for follow up on 02/18/2012. He is doing fairly well overall for his age. His only complaint is of intermittent pain over the right lateral chest wall in the area of the prior thoracotomy. He reports some pain recently associated with swelling around the incision but notes that he has no pain at this time. He remains somewhat active and still mows his grass. He denies any cough or sputum production. He has had no hemoptysis. He denies any headaches or visual changes.    Current Outpatient Prescriptions  Medication Sig Dispense Refill  . albuterol (PROVENTIL) 4 MG tablet Take 4 mg by mouth 3 (three) times daily.      Marland Kitchen ALPRAZolam (XANAX) 1 MG tablet Take 1 mg by mouth 3 (three) times daily as needed for anxiety.      Marland Kitchen aspirin EC 81 MG EC tablet Take 1 tablet (81 mg total) by mouth daily.  30 tablet  1  . calcium carbonate (TUMS - DOSED IN MG ELEMENTAL CALCIUM) 500 MG chewable tablet Chew 1 tablet by mouth daily as needed for heartburn.      . cyanocobalamin (,VITAMIN B-12,) 1000 MCG/ML injection Inject 1,000 mcg into the muscle every 30 (thirty) days. Receives at Primary MD office      . diltiazem (CARDIZEM CD) 240 MG 24 hr capsule TAKE ONE CAPSULE ORALLY EVERY MORNING.  30 capsule  6  . donepezil (ARICEPT) 5 MG tablet Take 5 mg by mouth at bedtime.       . fish oil-omega-3 fatty acids 1000 MG capsule Take 1 g by mouth daily.      Marland Kitchen HYDROcodone-acetaminophen (NORCO/VICODIN) 5-325 MG per tablet Take 1 tablet by mouth every 6 (six) hours as needed for pain.      Marland Kitchen insulin glargine (LANTUS) 100 UNIT/ML injection Inject 0-30 Units into the skin 2 (two) times daily. Injects according to home sliding scale      . isosorbide mononitrate (IMDUR) 30 MG 24 hr tablet TAKE ONE TABLET BY MOUTH ONCE DAILY.  30 tablet   6  . ONE TOUCH ULTRA TEST test strip       . pravastatin (PRAVACHOL) 80 MG tablet TAKE 1 TABLET BY MOUTH DAILY IN THE EVENING.  30 tablet  6  . predniSONE (DELTASONE) 20 MG tablet Take 2 tablets (40 mg total) by mouth daily.  10 tablet  0   No current facility-administered medications for this visit.   Facility-Administered Medications Ordered in Other Visits  Medication Dose Route Frequency Provider Last Rate Last Dose  . albuterol (PROVENTIL) (5 MG/ML) 0.5% nebulizer solution 2.5 mg  2.5 mg Nebulization Once Lendon Colonel, NP         Physical Exam: BP 116/78  Pulse 87  Resp 16  Ht 5\' 11"  (1.803 m)  Wt 186 lb (84.369 kg)  BMI 25.95 kg/m2  SpO2 98% He looks well.  Neck exam shows no cervical or supraclavicular adenopathy.  Lung exam is clear.  Cardiac exam shows a regular rate and rhythm with normal heart sounds.  The right thoracotomy scar has no visible or palpable abnormality. There are no chest wall lesions.    Diagnostic Tests:  CLINICAL DATA: Other chest pain. Swollen, painful area on the right  posterior chest just below the axilla, present for  a few weeks.  Productive cough for 2 weeks. History of lung cancer.  EXAM:  CHEST 2 VIEW  COMPARISON: 10/10/2013  FINDINGS:  Sequelae of prior CABG are again identified. Thoracic aortic  calcification is noted. Cardiomediastinal silhouette is grossly  unchanged, although the right heart border remains partially  obscured. There is persistent volume loss in the right hemothorax  related to prior surgery with elevation of the right hemidiaphragm  anteriorly. A component of loculated pleural fluid is not excluded  in the right base. Overall aeration of the right lung base is  slightly improved compared to the prior study. Minimal scarring  versus atelectasis is noted in the left lung base. There is no left  pleural effusion. No pneumothorax is identified. Postoperative  right-sided rib deformities are again noted.    IMPRESSION:  Chronic volume loss in the right hemithorax related to prior surgery  with slightly improved aeration of the right lung base. No definite  evidence of acute airspace disease.  Electronically Signed  By: Logan Bores  On: 11/15/2013 15:59   Impression:  He has no sign of recurrent lung cancer on CXR. We did a CT scan of the chest on 03/10/2013 due to right chest wall pain and this showed no abnormality of the chest wall and no sign of recurrent cancer.  I am not sure why he has intermittent right chest wall pain this far out from surgery but there was no abnormality seen on chest CT. Usually postop neuralgia is not intermittent and resolves over the first year. I guess he could have pulled a muscle or cracked a rib coughing. He is not having any pain at this time.    Plan:  He has no sign of recurrent cancer. I am not sure why he has intermittent right chest wall pain this far out from surgery but there was no abnormality seen on chest CT. Usually postop neuralgia is not intermittent and resolves over the first year. I guess he could have pulled a muscle or cracked a rib coughing. He is not having any pain at this time. I will plan to see him back in one year with a CXR. I told him that if he develops any further episodes of chest wall pain associated with swelling in the chest wall that he should contact my office so that I can see him immediately.

## 2013-12-13 ENCOUNTER — Other Ambulatory Visit: Payer: Self-pay | Admitting: Adult Health

## 2013-12-23 ENCOUNTER — Other Ambulatory Visit: Payer: Self-pay | Admitting: Adult Health

## 2014-02-15 ENCOUNTER — Other Ambulatory Visit (HOSPITAL_COMMUNITY): Payer: Self-pay | Admitting: Family Medicine

## 2014-02-15 DIAGNOSIS — D171 Benign lipomatous neoplasm of skin and subcutaneous tissue of trunk: Secondary | ICD-10-CM | POA: Diagnosis not present

## 2014-02-15 DIAGNOSIS — E663 Overweight: Secondary | ICD-10-CM | POA: Diagnosis not present

## 2014-02-15 DIAGNOSIS — Z6825 Body mass index (BMI) 25.0-25.9, adult: Secondary | ICD-10-CM | POA: Diagnosis not present

## 2014-02-15 DIAGNOSIS — R05 Cough: Secondary | ICD-10-CM

## 2014-02-15 DIAGNOSIS — R059 Cough, unspecified: Secondary | ICD-10-CM

## 2014-02-15 DIAGNOSIS — L905 Scar conditions and fibrosis of skin: Secondary | ICD-10-CM | POA: Diagnosis not present

## 2014-02-16 ENCOUNTER — Ambulatory Visit (HOSPITAL_COMMUNITY)
Admission: RE | Admit: 2014-02-16 | Discharge: 2014-02-16 | Disposition: A | Discharge status: Home / Self Care | Payer: Medicare Other | Source: Ambulatory Visit | Attending: Family Medicine | Admitting: Family Medicine

## 2014-02-16 DIAGNOSIS — R05 Cough: Secondary | ICD-10-CM | POA: Insufficient documentation

## 2014-02-16 DIAGNOSIS — R059 Cough, unspecified: Secondary | ICD-10-CM

## 2014-02-16 DIAGNOSIS — R918 Other nonspecific abnormal finding of lung field: Secondary | ICD-10-CM | POA: Diagnosis not present

## 2014-02-16 DIAGNOSIS — Z87891 Personal history of nicotine dependence: Secondary | ICD-10-CM | POA: Insufficient documentation

## 2014-03-16 ENCOUNTER — Ambulatory Visit: Payer: Medicare Other | Admitting: Surgery

## 2014-03-17 ENCOUNTER — Other Ambulatory Visit: Payer: Self-pay | Admitting: Adult Health

## 2014-03-23 ENCOUNTER — Ambulatory Visit: Payer: Medicare Other | Admitting: Surgery

## 2014-05-10 ENCOUNTER — Encounter (HOSPITAL_COMMUNITY): Payer: Self-pay | Admitting: Emergency Medicine

## 2014-05-10 ENCOUNTER — Encounter: Payer: Self-pay | Admitting: *Deleted

## 2014-05-10 ENCOUNTER — Emergency Department (HOSPITAL_COMMUNITY)
Admission: EM | Admit: 2014-05-10 | Discharge: 2014-05-10 | Disposition: A | Discharge status: Home / Self Care | Payer: Medicare Other | Attending: Emergency Medicine | Admitting: Emergency Medicine

## 2014-05-10 DIAGNOSIS — Z85118 Personal history of other malignant neoplasm of bronchus and lung: Secondary | ICD-10-CM | POA: Diagnosis not present

## 2014-05-10 DIAGNOSIS — E119 Type 2 diabetes mellitus without complications: Secondary | ICD-10-CM | POA: Insufficient documentation

## 2014-05-10 DIAGNOSIS — E785 Hyperlipidemia, unspecified: Secondary | ICD-10-CM | POA: Insufficient documentation

## 2014-05-10 DIAGNOSIS — I209 Angina pectoris, unspecified: Secondary | ICD-10-CM | POA: Insufficient documentation

## 2014-05-10 DIAGNOSIS — Z79899 Other long term (current) drug therapy: Secondary | ICD-10-CM | POA: Diagnosis not present

## 2014-05-10 DIAGNOSIS — Z794 Long term (current) use of insulin: Secondary | ICD-10-CM | POA: Insufficient documentation

## 2014-05-10 DIAGNOSIS — M25619 Stiffness of unspecified shoulder, not elsewhere classified: Secondary | ICD-10-CM | POA: Diagnosis present

## 2014-05-10 DIAGNOSIS — Z7952 Long term (current) use of systemic steroids: Secondary | ICD-10-CM | POA: Insufficient documentation

## 2014-05-10 DIAGNOSIS — Z8546 Personal history of malignant neoplasm of prostate: Secondary | ICD-10-CM | POA: Insufficient documentation

## 2014-05-10 DIAGNOSIS — Z859 Personal history of malignant neoplasm, unspecified: Secondary | ICD-10-CM | POA: Diagnosis not present

## 2014-05-10 DIAGNOSIS — Z7982 Long term (current) use of aspirin: Secondary | ICD-10-CM | POA: Insufficient documentation

## 2014-05-10 DIAGNOSIS — Z87891 Personal history of nicotine dependence: Secondary | ICD-10-CM | POA: Diagnosis not present

## 2014-05-10 DIAGNOSIS — M2560 Stiffness of unspecified joint, not elsewhere classified: Secondary | ICD-10-CM

## 2014-05-10 NOTE — ED Notes (Signed)
PA at the bedside.

## 2014-05-10 NOTE — ED Notes (Signed)
Pt sts bilateral leg and arm pain and stiffness after falling asleep in recliner for unusually long time this am; pt moving appropriately at present

## 2014-05-10 NOTE — ED Notes (Signed)
Patient refused gown. 

## 2014-05-10 NOTE — ED Notes (Signed)
MD at the bedside  

## 2014-05-10 NOTE — Progress Notes (Signed)
Patient ID: Thomas [name], male   DOB: 1927-08-15, 81 y.o.   MRN: [id number] Mr. Steinfeldt stopped by our office at approximately 1:10 pm today to see Dr. Cyndia Bent.  He is s/p thoracic surgery for lung cancer and is normally seen yearly with a chest xray.  But, he wanted to be seen today because "he woke up this morning and was stiff from his toes all the way up to about his waist. I must have something wring with my blood. I bet it is low."  I explained that he needed to see his medical doctor, but he couldn't understand why Dr. Cyndia Bent wouldn't see him and do blood work.  After several minutes of my continued reinforcing his need for medical follow up, he said he would just go to the emergency room.

## 2014-05-10 NOTE — ED Provider Notes (Signed)
CSN: [id number]     Arrival date & time 05/10/14  1333 History   First MD Initiated Contact with Patient 05/10/14 1524     Chief Complaint  Patient presents with  . Leg Pain  . Arm Pain     (Consider location/radiation/quality/duration/timing/severity/associated sxs/prior Treatment) HPI Thomas [name] is a 81 y.o. male with hx of DM, hyperlipidemia, lung and prostate ca, presents to ED with complaint of generalized stiffness which occurred this morning upon waking. States he woke up at 5 AM initially, a breakfast, back to sleep in recliner. He states he has slept on a recliner, that is a longest time he is ever slept in a recliner, and when he got up he felt stiff in his arms and in his legs. He states he was able to get up and walked around for about a minute and his symptoms resolved. He states he became concerned because he has never had this before and decided to come to the emergency department. He has not had any symptoms since then. He denies any current numbness or weakness in extremities, denies any difficulty speaking, walking, moving extremities. He has no complaints at this time and wants to go home.    Past Medical History  Diagnosis Date  . Diabetes mellitus   . Anginal pain   . Hyperlipemia   . Cancer   . Lung cancer   . Prostate cancer    Past Surgical History  Procedure Laterality Date  . Back surgery    . Tumor removal      from lung  . Prostate surgery    . Cardiac surgery    . Surgery to repair aneurysm     History reviewed. No pertinent family history. History  Substance Use Topics  . Smoking status: Former Smoker -- 1.00 packs/day for 50 years    Types: Cigarettes    Quit date: 12/10/1993  . Smokeless tobacco: Current User    Types: Chew  . Alcohol Use: No    Review of Systems  Constitutional: Negative for fever and chills.  Respiratory: Negative for cough, chest tightness and shortness of breath.   Cardiovascular: Negative for chest pain,  palpitations and leg swelling.  Gastrointestinal: Negative for nausea, vomiting, abdominal pain, diarrhea and abdominal distention.  Genitourinary: Negative for dysuria, urgency, frequency and hematuria.  Musculoskeletal: Negative for myalgias, arthralgias, neck pain and neck stiffness.  Skin: Negative for rash.  Allergic/Immunologic: Negative for immunocompromised state.  Neurological: Negative for dizziness, weakness, light-headedness, numbness and headaches.      Allergies  Review of patient's allergies indicates no known allergies.  Home Medications   Prior to Admission medications   Medication Sig Start Date End Date Taking? Authorizing Provider  albuterol (PROVENTIL) 4 MG tablet Take 4 mg by mouth 3 (three) times daily.    Historical Provider, MD  ALPRAZolam Duanne Moron) 1 MG tablet Take 1 mg by mouth 3 (three) times daily as needed for anxiety.    Historical Provider, MD  aspirin EC 81 MG EC tablet Take 1 tablet (81 mg total) by mouth daily. 10/02/12   Kathie Dike, MD  calcium carbonate (TUMS - DOSED IN MG ELEMENTAL CALCIUM) 500 MG chewable tablet Chew 1 tablet by mouth daily as needed for heartburn.    Historical Provider, MD  cyanocobalamin (,VITAMIN B-12,) 1000 MCG/ML injection Inject 1,000 mcg into the muscle every 30 (thirty) days. Receives at Primary MD office    Historical Provider, MD  diltiazem (CARDIZEM CD) 240 MG 24 hr  capsule TAKE ONE CAPSULE ORALLY EVERY MORNING. 12/23/13   Lendon Colonel, NP  donepezil (ARICEPT) 5 MG tablet Take 5 mg by mouth at bedtime.  09/28/12   Historical Provider, MD  fish oil-omega-3 fatty acids 1000 MG capsule Take 1 g by mouth daily.    Historical Provider, MD  HYDROcodone-acetaminophen (NORCO/VICODIN) 5-325 MG per tablet Take 1 tablet by mouth every 6 (six) hours as needed for pain.    Historical Provider, MD  insulin glargine (LANTUS) 100 UNIT/ML injection Inject 0-30 Units into the skin 2 (two) times daily. Injects according to home sliding  scale    Historical Provider, MD  isosorbide mononitrate (IMDUR) 30 MG 24 hr tablet TAKE ONE TABLET BY MOUTH ONCE DAILY. 03/17/14   Lendon Colonel, NP  ONE TOUCH ULTRA TEST test strip  10/19/13   Historical Provider, MD  pravastatin (PRAVACHOL) 80 MG tablet TAKE 1 TABLET BY MOUTH DAILY IN THE EVENING. 12/23/13   Lendon Colonel, NP  predniSONE (DELTASONE) 20 MG tablet Take 2 tablets (40 mg total) by mouth daily. 12/06/12   Francine Graven, DO   BP 125/74 mmHg  Pulse 71  Temp(Src) 97.9 F (36.6 C) (Oral)  Resp 18  Ht 5\' 11"  (1.803 m)  Wt 180 lb (81.647 kg)  BMI 25.12 kg/m2  SpO2 95% Physical Exam  Constitutional: He is oriented to person, place, and time. He appears well-developed and well-nourished. No distress.  HENT:  Head: Normocephalic and atraumatic.  Eyes: Conjunctivae and EOM are normal. Pupils are equal, round, and reactive to light.  Neck: Neck supple.  Cardiovascular: Normal rate, regular rhythm and normal heart sounds.   Pulmonary/Chest: Effort normal. No respiratory distress. He has no wheezes. He has no rales.  Abdominal: Soft. Bowel sounds are normal. He exhibits no distension. There is no tenderness. There is no rebound.  Musculoskeletal: He exhibits no edema.  Neurological: He is alert and oriented to person, place, and time. No cranial nerve deficit. Coordination normal.  5/5 and equal upper and lower extremity strength bilaterally. Equal grip strength bilaterally. Normal finger to nose and heel to shin. No pronator drift. Patellar reflexes 2+. Gait is normal   Skin: Skin is warm and dry.  Nursing note and vitals reviewed.   ED Course  Procedures (including critical care time) Labs Review Labs Reviewed - No data to display  Imaging Review No results found.   EKG Interpretation None      MDM   Final diagnoses:  Generalized stiffness    patient is here after an episode of stiffness in arms and legs upon waking up after sleeping in a recliner.  Symptoms resolved as he began to walk and move. He has not had any symptoms since waking up this morning. He is completely symptomatic at this time. Normal neurological exam. Patient does not wish to have any labs or tests done at this time and wants to go home. Low concern about CVA given bilateral involvement, no numbness or weakness, just stiffness. Could be from sleeping in recliner. Currently asymptomatic. Discussed with Dr. Betsey Holiday. Pt OK to be d/c home with close outpatient follow up.   Filed Vitals:   05/10/14 1345 05/10/14 1550  BP: 125/74 131/64  Pulse: 71 62  Temp: 97.9 F (36.6 C) 97.4 F (36.3 C)  TempSrc: Oral Oral  Resp: 18 18  Height: 5\' 11"  (1.803 m)   Weight: 180 lb (81.647 kg)   SpO2: 95% 100%       Jeannett Senior,  PA-C 05/10/14 [place], MD 05/10/14 [phone]

## 2014-05-10 NOTE — Discharge Instructions (Signed)
Please follow up with your primary care doctor if you are having any issues.   Return if new concerning symptoms.

## 2014-05-10 NOTE — ED Provider Notes (Signed)
Patient presented to the ER with strep any pain. Patient reports that he got up early this morning and then sat in his recliner and fell asleep again. He fell asleep for another 5 hours and when he woke up he had stiffness in all of his extremities with some mild pain. This only lasted for a couple of minutes and resolve when he got up and moved around. No further symptoms.  Face to face Exam: HEENT - PERRLA Lungs - CTAB Heart - RRR, no M/R/G Abd - S/NT/ND Neuro - alert, oriented x3  Plan: She presents for evaluation of pain and stiffness in his extremities after sleeping in a recliner for an extended period of time. Symptoms have resolved. Examination is unremarkable. Vital signs are normal. Patient reassured, no workup necessary.   Orpah Greek, MD 05/10/14 [phone]

## 2014-05-30 DIAGNOSIS — Z6824 Body mass index (BMI) 24.0-24.9, adult: Secondary | ICD-10-CM | POA: Diagnosis not present

## 2014-05-30 DIAGNOSIS — J069 Acute upper respiratory infection, unspecified: Secondary | ICD-10-CM | POA: Diagnosis not present

## 2014-07-04 ENCOUNTER — Other Ambulatory Visit: Payer: Self-pay | Admitting: Adult Health

## 2014-07-28 ENCOUNTER — Other Ambulatory Visit: Payer: Self-pay | Admitting: Adult Health

## 2014-08-04 DIAGNOSIS — E119 Type 2 diabetes mellitus without complications: Secondary | ICD-10-CM | POA: Diagnosis not present

## 2014-08-04 DIAGNOSIS — I10 Essential (primary) hypertension: Secondary | ICD-10-CM | POA: Diagnosis not present

## 2014-08-04 DIAGNOSIS — Z1389 Encounter for screening for other disorder: Secondary | ICD-10-CM | POA: Diagnosis not present

## 2014-08-04 DIAGNOSIS — E1140 Type 2 diabetes mellitus with diabetic neuropathy, unspecified: Secondary | ICD-10-CM | POA: Diagnosis not present

## 2014-08-04 DIAGNOSIS — E782 Mixed hyperlipidemia: Secondary | ICD-10-CM | POA: Diagnosis not present

## 2014-08-04 DIAGNOSIS — Z6824 Body mass index (BMI) 24.0-24.9, adult: Secondary | ICD-10-CM | POA: Diagnosis not present

## 2014-08-25 ENCOUNTER — Telehealth: Payer: Self-pay

## 2014-08-25 NOTE — Telephone Encounter (Signed)
-----   Message from Orinda Kenner sent at 08/25/2014 10:20 AM EDT ----- Regarding: RE: needs fu with kl Patient does not want to schedule at this time.   Coralyn Mark ----- Message -----    From: Bernita Raisin, RN    Sent: 07/28/2014  11:57 AM      To: Bertram Gala Goins Subject: needs fu with kl                               Needs fu wit kl wants refills

## 2014-08-25 NOTE — Telephone Encounter (Signed)
Mailed letter to pt stating we an no longer refill medications if he declines to make f/u apt

## 2014-09-02 ENCOUNTER — Ambulatory Visit: Payer: Medicare Other | Admitting: Adult Health

## 2014-09-12 DIAGNOSIS — N3945 Continuous leakage: Secondary | ICD-10-CM | POA: Diagnosis not present

## 2014-09-12 DIAGNOSIS — R972 Elevated prostate specific antigen [PSA]: Secondary | ICD-10-CM | POA: Diagnosis not present

## 2014-09-12 DIAGNOSIS — C61 Malignant neoplasm of prostate: Secondary | ICD-10-CM | POA: Diagnosis not present

## 2014-09-19 DIAGNOSIS — C61 Malignant neoplasm of prostate: Secondary | ICD-10-CM | POA: Diagnosis not present

## 2014-09-19 DIAGNOSIS — R972 Elevated prostate specific antigen [PSA]: Secondary | ICD-10-CM | POA: Diagnosis not present

## 2014-09-19 DIAGNOSIS — N3945 Continuous leakage: Secondary | ICD-10-CM | POA: Diagnosis not present

## 2014-09-19 DIAGNOSIS — R0781 Pleurodynia: Secondary | ICD-10-CM | POA: Diagnosis not present

## 2014-09-19 DIAGNOSIS — Z85118 Personal history of other malignant neoplasm of bronchus and lung: Secondary | ICD-10-CM | POA: Diagnosis not present

## 2014-09-26 ENCOUNTER — Other Ambulatory Visit: Payer: Self-pay | Admitting: Adult Health

## 2014-09-26 MED ORDER — ISOSORBIDE MONONITRATE ER 30 MG PO TB24: 30.0000 mg | ORAL_TABLET | Freq: Every day | ORAL | Status: DC

## 2014-09-26 MED ORDER — DILTIAZEM HCL ER COATED BEADS 240 MG PO CP24: 240.0000 mg | ORAL_CAPSULE | Freq: Every day | ORAL | Status: DC

## 2014-09-26 MED ORDER — PRAVASTATIN SODIUM 80 MG PO TABS: ORAL_TABLET | ORAL | Status: DC

## 2014-09-26 NOTE — Telephone Encounter (Signed)
Courtesy 1 month refill given last time pt neeeds fu apt

## 2014-09-26 NOTE — Telephone Encounter (Signed)
Completely out of all his heart medications  Needs to send to [facility] in [place]

## 2014-09-28 DIAGNOSIS — R948 Abnormal results of function studies of other organs and systems: Secondary | ICD-10-CM | POA: Diagnosis not present

## 2014-09-28 DIAGNOSIS — Z951 Presence of aortocoronary bypass graft: Secondary | ICD-10-CM | POA: Diagnosis not present

## 2014-09-28 DIAGNOSIS — C61 Malignant neoplasm of prostate: Secondary | ICD-10-CM | POA: Diagnosis not present

## 2014-09-28 DIAGNOSIS — R937 Abnormal findings on diagnostic imaging of other parts of musculoskeletal system: Secondary | ICD-10-CM | POA: Diagnosis not present

## 2014-10-16 NOTE — Progress Notes (Signed)
Cardiology Office Note   Date:  10/17/2014   ID:  Thomas [name], DOB 08-15-1927, MRN [id number]  PCP:  Glo Herring., MD  Cardiologist: Cloria Spring, NP   Chief Complaint  Patient presents with  . Coronary Artery Disease  . Hypertension      History of Present Illness: Thomas [name] is a 81 y.o. male who presents for ongoing assessment and management of CAD with a CABG in the past, The patient had a followup nuclear medicine stress test, revealing no evidence of reversal ischemia or clear evidence of prior infarction with normal LV systolic function dated 70/96/2836. This is overall low risk study. He was last seen in Sept 2015 and was without complaints.  Since being seen last, he has had thoracic surgery for lung cancer by Dr. Cyndia Bent. He was seen in ER for generalized leg stiffness and could not walk well. He was given reassurance and sent home without changes in medications. He refused testing.  He comes today for medication refills. He is asymptomatic. He states that Dr. Gerarda Fraction is sending him to a specialist in [place] due to abnormal labs. He is doing a lot of coughing, stating that it is thick and yellow. He coughs most at nighttime. He is very hard of hearing, requiring me to repeat questions etc.   Past Medical History  Diagnosis Date  . Diabetes mellitus   . Anginal pain   . Hyperlipemia   . Cancer   . Lung cancer   . Prostate cancer     Past Surgical History  Procedure Laterality Date  . Back surgery    . Tumor removal      from lung  . Prostate surgery    . Cardiac surgery    . Surgery to repair aneurysm       Current Outpatient Prescriptions  Medication Sig Dispense Refill  . ALPRAZolam (XANAX) 1 MG tablet Take 1 mg by mouth 3 (three) times daily as needed for anxiety.    Marland Kitchen aspirin EC 81 MG EC tablet Take 1 tablet (81 mg total) by mouth daily. 30 tablet 1  . diltiazem (CARDIZEM CD) 240 MG 24 hr capsule Take 1 capsule (240 mg total) by mouth  daily. 30 capsule 0  . fish oil-omega-3 fatty acids 1000 MG capsule Take 1 g by mouth daily.    Marland Kitchen HYDROcodone-acetaminophen (NORCO/VICODIN) 5-325 MG per tablet Take 1 tablet by mouth every 6 (six) hours as needed for pain.    Marland Kitchen insulin glargine (LANTUS) 100 UNIT/ML injection Inject 0-30 Units into the skin 2 (two) times daily. Injects according to home sliding scale    . isosorbide mononitrate (IMDUR) 30 MG 24 hr tablet Take 1 tablet (30 mg total) by mouth daily. 30 tablet 0  . ONE TOUCH ULTRA TEST test strip     . pravastatin (PRAVACHOL) 80 MG tablet TAKE 1 TABLET BY MOUTH DAILY IN THE EVENING. 30 tablet 0   No current facility-administered medications for this visit.   Facility-Administered Medications Ordered in Other Visits  Medication Dose Route Frequency Provider Last Rate Last Dose  . albuterol (PROVENTIL) (5 MG/ML) 0.5% nebulizer solution 2.5 mg  2.5 mg Nebulization Once Lendon Colonel, NP        Allergies:   Review of patient's allergies indicates no known allergies.    Social History:  The patient  reports that he quit smoking about 20 years ago. His smoking use included Cigarettes. He has a 50 pack-year smoking history. His  smokeless tobacco use includes Chew. He reports that he does not drink alcohol or use illicit drugs.   Family History:  The patient's family history is not on file.    ROS: All other systems are reviewed and negative. Unless otherwise mentioned in H&P    PHYSICAL EXAM: VS:  BP 140/88 mmHg  Pulse 75  Ht '5\' 11"'$  (1.803 m)  Wt 168 lb 4.8 oz (76.34 kg)  BMI 23.48 kg/m2  SpO2 97% , BMI Body mass index is 23.48 kg/(m^2). GEN: Well nourished, well developed, in no acute distress HEENT: normal Neck: no JVD, carotid bruits, or masses Cardiac: RRR; no murmurs, rubs, or gallops,no edema  Respiratory:  Bilateral upper airway crackles. Some non-productive coughing.  GI: soft, nontender, nondistended, + BS MS: no deformity or atrophy Skin: warm and dry, no  rash Neuro:  Strength and sensation are intact. Very hard of hearing.  Psych: euthymic mood, full affect   EKG:   The ekg ordered today demonstrates NSR with RBBB, rate of 61 bpm.    Lipid Panel    Component Value Date/Time   CHOL 93 10/01/2012 0931   TRIG 60 10/01/2012 0931   HDL 55 10/01/2012 0931   CHOLHDL 1.7 10/01/2012 0931   VLDL 12 10/01/2012 0931   LDLCALC 26 10/01/2012 0931      Wt Readings from Last 3 Encounters:  10/17/14 168 lb 4.8 oz (76.34 kg)  05/10/14 180 lb (81.647 kg)  11/24/13 186 lb (84.369 kg)     ASSESSMENT AND PLAN:  1. CAD: Hx of CABG Most recent stress test one year ago was negative for ischemia. He will continue diltiazem, isosorbide, and pravastatin. He is asymptomatic from a cardiac standpoint. Refills are provided. Will see him again in 6 months.   2. Cough and congestion: I will get a sputum sample and have the results sent to Dr. Gerarda Fraction.  He will treat if necessary.   3. Hx of Lung CA: S/P resection by Dr. Cyndia Bent. To follow with him.    Current medicines are reviewed at length with the patient today.    Labs/ tests ordered today include: Sputum for C&S and gram stain.   Orders Placed This Encounter  Procedures  . Respiratory or Resp and Sputum Culture  . EKG 12-Lead     Disposition:   FU with 6 months.  Signed, Jory Sims, NP  10/17/2014 4:09 PM    [facility] [address], [place],  [address] Phone: [phone]; Fax: [phone]

## 2014-10-17 ENCOUNTER — Ambulatory Visit (INDEPENDENT_AMBULATORY_CARE_PROVIDER_SITE_OTHER): Payer: Medicare Other | Admitting: Adult Health

## 2014-10-17 ENCOUNTER — Encounter: Payer: Self-pay | Admitting: Adult Health

## 2014-10-17 VITALS — BP 140/88 | HR 75 | Ht 71.0 in | Wt 168.3 lb

## 2014-10-17 DIAGNOSIS — I2510 Atherosclerotic heart disease of native coronary artery without angina pectoris: Secondary | ICD-10-CM

## 2014-10-17 DIAGNOSIS — R0989 Other specified symptoms and signs involving the circulatory and respiratory systems: Secondary | ICD-10-CM

## 2014-10-17 DIAGNOSIS — Z136 Encounter for screening for cardiovascular disorders: Secondary | ICD-10-CM | POA: Diagnosis not present

## 2014-10-17 DIAGNOSIS — R05 Cough: Secondary | ICD-10-CM

## 2014-10-17 DIAGNOSIS — R058 Other specified cough: Secondary | ICD-10-CM

## 2014-10-17 NOTE — Patient Instructions (Signed)
Your physician recommends that you continue on your current medications as directed. Please refer to the Current Medication list given to you today. Your physician recommends that you lab work today to check a sputum culture. Please go to [facility] to give this specimen. Your physician recommends that you schedule a follow-up appointment in: 6 months. You will receive a reminder letter in the mail in about 4 months reminding you to call and schedule your appointment. If you don't receive this letter, please contact our office.

## 2014-10-17 NOTE — Progress Notes (Deleted)
Name: Thomas [name]    DOB: 1927/02/26  Age: 81 y.o.  MR#: [id number]       PCP:  Glo Herring., MD      Insurance: Payor: [employer] MEDICARE / Plan: [facility] MEDICARE / Product Type: *No Product type* /   CC:   No chief complaint on file.   VS Filed Vitals:   10/17/14 1455  BP: 140/88  Pulse: 75  Height: '5\' 11"'$  (1.803 m)  Weight: 168 lb 4.8 oz (76.34 kg)  SpO2: 97%    Weights Current Weight  10/17/14 168 lb 4.8 oz (76.34 kg)  05/10/14 180 lb (81.647 kg)  11/24/13 186 lb (84.369 kg)    Blood Pressure  BP Readings from Last 3 Encounters:  10/17/14 140/88  05/10/14 131/64  11/24/13 116/78     Admit date:  (Not on file) Last encounter with RMR:  09/26/2014   Allergy Review of patient's allergies indicates no known allergies.  Current Outpatient Prescriptions  Medication Sig Dispense Refill  . ALPRAZolam (XANAX) 1 MG tablet Take 1 mg by mouth 3 (three) times daily as needed for anxiety.    Marland Kitchen aspirin EC 81 MG EC tablet Take 1 tablet (81 mg total) by mouth daily. 30 tablet 1  . diltiazem (CARDIZEM CD) 240 MG 24 hr capsule Take 1 capsule (240 mg total) by mouth daily. 30 capsule 0  . fish oil-omega-3 fatty acids 1000 MG capsule Take 1 g by mouth daily.    Marland Kitchen HYDROcodone-acetaminophen (NORCO/VICODIN) 5-325 MG per tablet Take 1 tablet by mouth every 6 (six) hours as needed for pain.    Marland Kitchen insulin glargine (LANTUS) 100 UNIT/ML injection Inject 0-30 Units into the skin 2 (two) times daily. Injects according to home sliding scale    . isosorbide mononitrate (IMDUR) 30 MG 24 hr tablet Take 1 tablet (30 mg total) by mouth daily. 30 tablet 0  . ONE TOUCH ULTRA TEST test strip     . pravastatin (PRAVACHOL) 80 MG tablet TAKE 1 TABLET BY MOUTH DAILY IN THE EVENING. 30 tablet 0   No current facility-administered medications for this visit.   Facility-Administered Medications Ordered in Other Visits  Medication Dose Route Frequency Provider Last Rate Last Dose  . albuterol  (PROVENTIL) (5 MG/ML) 0.5% nebulizer solution 2.5 mg  2.5 mg Nebulization Once Lendon Colonel, NP        Discontinued Meds:    Medications Discontinued During This Encounter  Medication Reason  . albuterol (PROVENTIL) 4 MG tablet Error  . predniSONE (DELTASONE) 20 MG tablet Error  . donepezil (ARICEPT) 5 MG tablet Error  . cyanocobalamin (,VITAMIN B-12,) 1000 MCG/ML injection Error  . calcium carbonate (TUMS - DOSED IN MG ELEMENTAL CALCIUM) 500 MG chewable tablet Error    Patient Active Problem List   Diagnosis Date Noted  . Abnormal laboratory test result 11/01/2012  . Epigastric pain 09/30/2012  . NSTEMI (non-ST elevated myocardial infarction) 09/30/2012  . Acute renal failure 09/09/2012  . Acute encephalopathy 09/07/2012  . Malignant neoplasm of upper lobe, bronchus or lung 02/19/2011  . Right bundle branch block and left anterior fascicular block 03/31/2009  . CHEST PAIN-UNSPECIFIED 03/31/2009  . DIABETES MELLITUS, TYPE II 07/18/2008  . HYPERLIPIDEMIA-MIXED 07/18/2008  . CAD, ARTERY BYPASS GRAFT 07/18/2008  . COPD 07/18/2008    LABS    Component Value Date/Time   NA 138 12/06/2012 2029   NA 141 11/13/2012 0950   NA 143 10/02/2012 0452   K 4.2 12/06/2012 2029  K 3.4* 11/13/2012 0950   K 3.4* 10/02/2012 0452   CL 101 12/06/2012 2029   CL 104 11/13/2012 0950   CL 105 10/02/2012 0452   CO2 30 12/06/2012 2029   CO2 28 11/13/2012 0950   CO2 30 10/02/2012 0452   GLUCOSE 152* 12/06/2012 2029   GLUCOSE 137* 11/13/2012 0950   GLUCOSE 83 10/02/2012 0452   BUN 14 12/06/2012 2029   BUN 12 11/13/2012 0950   BUN 13 10/02/2012 0452   CREATININE 1.07 12/06/2012 2029   CREATININE 1.07 11/13/2012 0950   CREATININE 1.10 10/02/2012 0452   CALCIUM 10.0 12/06/2012 2029   CALCIUM 10.0 11/13/2012 0950   CALCIUM 9.6 10/02/2012 0452   GFRNONAA 63* 12/06/2012 2029   GFRNONAA 63* 11/13/2012 0950   GFRNONAA 61* 10/02/2012 0452   GFRAA 73* 12/06/2012 2029   GFRAA 73* 11/13/2012  0950   GFRAA 70* 10/02/2012 0452   CMP     Component Value Date/Time   NA 138 12/06/2012 2029   K 4.2 12/06/2012 2029   CL 101 12/06/2012 2029   CO2 30 12/06/2012 2029   GLUCOSE 152* 12/06/2012 2029   BUN 14 12/06/2012 2029   CREATININE 1.07 12/06/2012 2029   CALCIUM 10.0 12/06/2012 2029   PROT 7.3 11/13/2012 0950   ALBUMIN 3.7 11/13/2012 0950   AST 22 11/13/2012 0950   ALT 13 11/13/2012 0950   ALKPHOS 55 11/13/2012 0950   BILITOT 0.8 11/13/2012 0950   GFRNONAA 63* 12/06/2012 2029   GFRAA 73* 12/06/2012 2029       Component Value Date/Time   WBC 6.4 12/06/2012 2029   WBC 7.1 11/13/2012 0950   WBC 6.2 10/02/2012 0452   HGB 13.9 12/06/2012 2029   HGB 14.4 11/13/2012 0950   HGB 13.4 10/02/2012 0452   HCT 40.5 12/06/2012 2029   HCT 41.4 11/13/2012 0950   HCT 39.7 10/02/2012 0452   MCV 92.3 12/06/2012 2029   MCV 92.0 11/13/2012 0950   MCV 93.2 10/02/2012 0452    Lipid Panel     Component Value Date/Time   CHOL 93 10/01/2012 0931   TRIG 60 10/01/2012 0931   HDL 55 10/01/2012 0931   CHOLHDL 1.7 10/01/2012 0931   VLDL 12 10/01/2012 0931   LDLCALC 26 10/01/2012 0931    ABG    Component Value Date/Time   PHART 7.298* 12/15/2006 0600   PCO2ART 49.4* 12/15/2006 0600   PO2ART 84.8 12/15/2006 0600   HCO3 25.3* 01/24/2007 2121   TCO2 25 12/20/2010 2340   ACIDBASEDEF 1.0 01/24/2007 2121   O2SAT 96.4 12/15/2006 0600     Lab Results  Component Value Date   TSH 0.784 09/30/2012   BNP (last 3 results) No results for input(s): BNP in the last 8760 hours.  ProBNP (last 3 results) No results for input(s): PROBNP in the last 8760 hours.  Cardiac Panel (last 3 results) No results for input(s): CKTOTAL, CKMB, TROPONINI, RELINDX in the last 72 hours.  Iron/TIBC/Ferritin/ %Sat No results found for: IRON, TIBC, FERRITIN, IRONPCTSAT   EKG Orders placed or performed in visit on 10/17/14  . EKG 12-Lead     Prior Assessment and Plan Problem List as of 10/17/2014       Cardiovascular and Mediastinum   CAD, ARTERY BYPASS GRAFT   Last Assessment & Plan 10/28/2013 Office Visit Written 10/28/2013  2:04 PM by Lendon Colonel, NP    I have discussed with him the results of the stress test, which were found to be very  low risk study without evidence of new ischemia. The patient will continue his current medication regimen without changes. We will see him again in 6 months. He verbalizes understanding.      Right bundle branch block and left anterior fascicular block   Last Assessment & Plan 03/29/2011 Office Visit Written 03/29/2011  2:19 PM by Renella Cunas, MD    Unchanged.      NSTEMI (non-ST elevated myocardial infarction)     Respiratory   COPD   Last Assessment & Plan 10/28/2013 Office Visit Written 10/28/2013  2:04 PM by Lendon Colonel, NP    He continues to have some issues with coughing at night before bed. I have asked him to speak with his primary care physician about this. If symptoms persist.      Malignant neoplasm of upper lobe, bronchus or lung   Last Assessment & Plan 02/19/2011 Office Visit Written 02/19/2011 11:56 AM by Gaye Pollack, MD    S/p right upper lobectomy and right lower lobe superior segmentectomy 09/05/1999 for T2, N0, M0 squamous cell carcinoma.        Endocrine   DIABETES MELLITUS, TYPE II     Nervous and Auditory   Acute encephalopathy     Genitourinary   Acute renal failure     Other   HYPERLIPIDEMIA-MIXED   Last Assessment & Plan 10/28/2013 Office Visit Written 10/28/2013  2:04 PM by Lendon Colonel, NP    Continue current medication regimen, low cholesterol diet. We will see him in 6 months.      CHEST PAIN-UNSPECIFIED   Last Assessment & Plan 10/28/2013 Office Visit Written 10/28/2013  2:04 PM by Lendon Colonel, NP    Resolved. No changes in medicine or further testing.      Epigastric pain   Abnormal laboratory test result       Imaging: No results found.

## 2014-10-18 DIAGNOSIS — R972 Elevated prostate specific antigen [PSA]: Secondary | ICD-10-CM | POA: Diagnosis not present

## 2014-10-18 DIAGNOSIS — C61 Malignant neoplasm of prostate: Secondary | ICD-10-CM | POA: Diagnosis not present

## 2014-10-18 DIAGNOSIS — R948 Abnormal results of function studies of other organs and systems: Secondary | ICD-10-CM | POA: Diagnosis not present

## 2014-10-18 DIAGNOSIS — N3945 Continuous leakage: Secondary | ICD-10-CM | POA: Diagnosis not present

## 2014-10-19 DIAGNOSIS — R0989 Other specified symptoms and signs involving the circulatory and respiratory systems: Secondary | ICD-10-CM | POA: Diagnosis not present

## 2014-10-19 DIAGNOSIS — R05 Cough: Secondary | ICD-10-CM | POA: Diagnosis not present

## 2014-10-19 DIAGNOSIS — R972 Elevated prostate specific antigen [PSA]: Secondary | ICD-10-CM | POA: Diagnosis not present

## 2014-10-22 LAB — RESPIRATORY CULTURE OR RESPIRATORY AND SPUTUM CULTURE: Organism ID, Bacteria: NORMAL

## 2014-10-28 DIAGNOSIS — Z1389 Encounter for screening for other disorder: Secondary | ICD-10-CM | POA: Diagnosis not present

## 2014-10-28 DIAGNOSIS — G894 Chronic pain syndrome: Secondary | ICD-10-CM | POA: Diagnosis not present

## 2014-10-28 DIAGNOSIS — Z6823 Body mass index (BMI) 23.0-23.9, adult: Secondary | ICD-10-CM | POA: Diagnosis not present

## 2014-10-28 DIAGNOSIS — F419 Anxiety disorder, unspecified: Secondary | ICD-10-CM | POA: Diagnosis not present

## 2014-10-28 DIAGNOSIS — Z23 Encounter for immunization: Secondary | ICD-10-CM | POA: Diagnosis not present

## 2014-10-31 ENCOUNTER — Other Ambulatory Visit: Payer: Self-pay | Admitting: Adult Health

## 2014-11-10 DIAGNOSIS — L570 Actinic keratosis: Secondary | ICD-10-CM | POA: Diagnosis not present

## 2014-11-10 DIAGNOSIS — D2339 Other benign neoplasm of skin of other parts of face: Secondary | ICD-10-CM | POA: Diagnosis not present

## 2014-11-10 DIAGNOSIS — Z08 Encounter for follow-up examination after completed treatment for malignant neoplasm: Secondary | ICD-10-CM | POA: Diagnosis not present

## 2014-11-10 DIAGNOSIS — Z1283 Encounter for screening for malignant neoplasm of skin: Secondary | ICD-10-CM | POA: Diagnosis not present

## 2014-11-10 DIAGNOSIS — X32XXXD Exposure to sunlight, subsequent encounter: Secondary | ICD-10-CM | POA: Diagnosis not present

## 2014-11-10 DIAGNOSIS — Z85828 Personal history of other malignant neoplasm of skin: Secondary | ICD-10-CM | POA: Diagnosis not present

## 2014-11-10 DIAGNOSIS — L851 Acquired keratosis [keratoderma] palmaris et plantaris: Secondary | ICD-10-CM | POA: Diagnosis not present

## 2014-11-22 DIAGNOSIS — F419 Anxiety disorder, unspecified: Secondary | ICD-10-CM | POA: Diagnosis not present

## 2014-11-22 DIAGNOSIS — M792 Neuralgia and neuritis, unspecified: Secondary | ICD-10-CM | POA: Diagnosis not present

## 2014-11-22 DIAGNOSIS — Z1389 Encounter for screening for other disorder: Secondary | ICD-10-CM | POA: Diagnosis not present

## 2014-11-22 DIAGNOSIS — G894 Chronic pain syndrome: Secondary | ICD-10-CM | POA: Diagnosis not present

## 2014-11-22 DIAGNOSIS — Z6823 Body mass index (BMI) 23.0-23.9, adult: Secondary | ICD-10-CM | POA: Diagnosis not present

## 2014-11-30 ENCOUNTER — Ambulatory Visit: Payer: Medicare Other | Admitting: Surgery

## 2014-12-05 DIAGNOSIS — Z6823 Body mass index (BMI) 23.0-23.9, adult: Secondary | ICD-10-CM | POA: Diagnosis not present

## 2014-12-05 DIAGNOSIS — Z1389 Encounter for screening for other disorder: Secondary | ICD-10-CM | POA: Diagnosis not present

## 2014-12-05 DIAGNOSIS — G894 Chronic pain syndrome: Secondary | ICD-10-CM | POA: Diagnosis not present

## 2015-01-12 ENCOUNTER — Ambulatory Visit (HOSPITAL_COMMUNITY)
Admission: RE | Admit: 2015-01-12 | Discharge: 2015-01-12 | Disposition: A | Discharge status: Home / Self Care | Payer: Medicare Other | Source: Ambulatory Visit | Attending: Internal Medicine | Admitting: Internal Medicine

## 2015-01-12 ENCOUNTER — Other Ambulatory Visit (HOSPITAL_COMMUNITY): Payer: Self-pay | Admitting: Internal Medicine

## 2015-01-12 DIAGNOSIS — Z6823 Body mass index (BMI) 23.0-23.9, adult: Secondary | ICD-10-CM | POA: Diagnosis not present

## 2015-01-12 DIAGNOSIS — R05 Cough: Secondary | ICD-10-CM

## 2015-01-12 DIAGNOSIS — J449 Chronic obstructive pulmonary disease, unspecified: Secondary | ICD-10-CM | POA: Diagnosis not present

## 2015-01-12 DIAGNOSIS — E1142 Type 2 diabetes mellitus with diabetic polyneuropathy: Secondary | ICD-10-CM | POA: Diagnosis not present

## 2015-01-12 DIAGNOSIS — R059 Cough, unspecified: Secondary | ICD-10-CM

## 2015-01-12 DIAGNOSIS — I10 Essential (primary) hypertension: Secondary | ICD-10-CM | POA: Diagnosis not present

## 2015-01-12 DIAGNOSIS — K219 Gastro-esophageal reflux disease without esophagitis: Secondary | ICD-10-CM | POA: Diagnosis not present

## 2015-01-12 DIAGNOSIS — Z1389 Encounter for screening for other disorder: Secondary | ICD-10-CM | POA: Diagnosis not present

## 2015-01-26 DIAGNOSIS — C61 Malignant neoplasm of prostate: Secondary | ICD-10-CM | POA: Diagnosis not present

## 2015-02-17 ENCOUNTER — Other Ambulatory Visit: Payer: Self-pay | Admitting: Adult Health

## 2015-02-21 DIAGNOSIS — Z6823 Body mass index (BMI) 23.0-23.9, adult: Secondary | ICD-10-CM | POA: Diagnosis not present

## 2015-02-21 DIAGNOSIS — G894 Chronic pain syndrome: Secondary | ICD-10-CM | POA: Diagnosis not present

## 2015-02-21 DIAGNOSIS — M1991 Primary osteoarthritis, unspecified site: Secondary | ICD-10-CM | POA: Diagnosis not present

## 2015-02-21 DIAGNOSIS — Z1389 Encounter for screening for other disorder: Secondary | ICD-10-CM | POA: Diagnosis not present

## 2015-02-21 DIAGNOSIS — J449 Chronic obstructive pulmonary disease, unspecified: Secondary | ICD-10-CM | POA: Diagnosis not present

## 2015-02-21 DIAGNOSIS — E1142 Type 2 diabetes mellitus with diabetic polyneuropathy: Secondary | ICD-10-CM | POA: Diagnosis not present

## 2015-02-24 IMAGING — CR DG CHEST 1V
2 series · 2 of 2 positions shown · non-contrast
Comparison: February 18, 2012

CLINICAL DATA: Altered mental status, fever

CHEST - 1 VIEW

[view not recorded (1 of 2)]
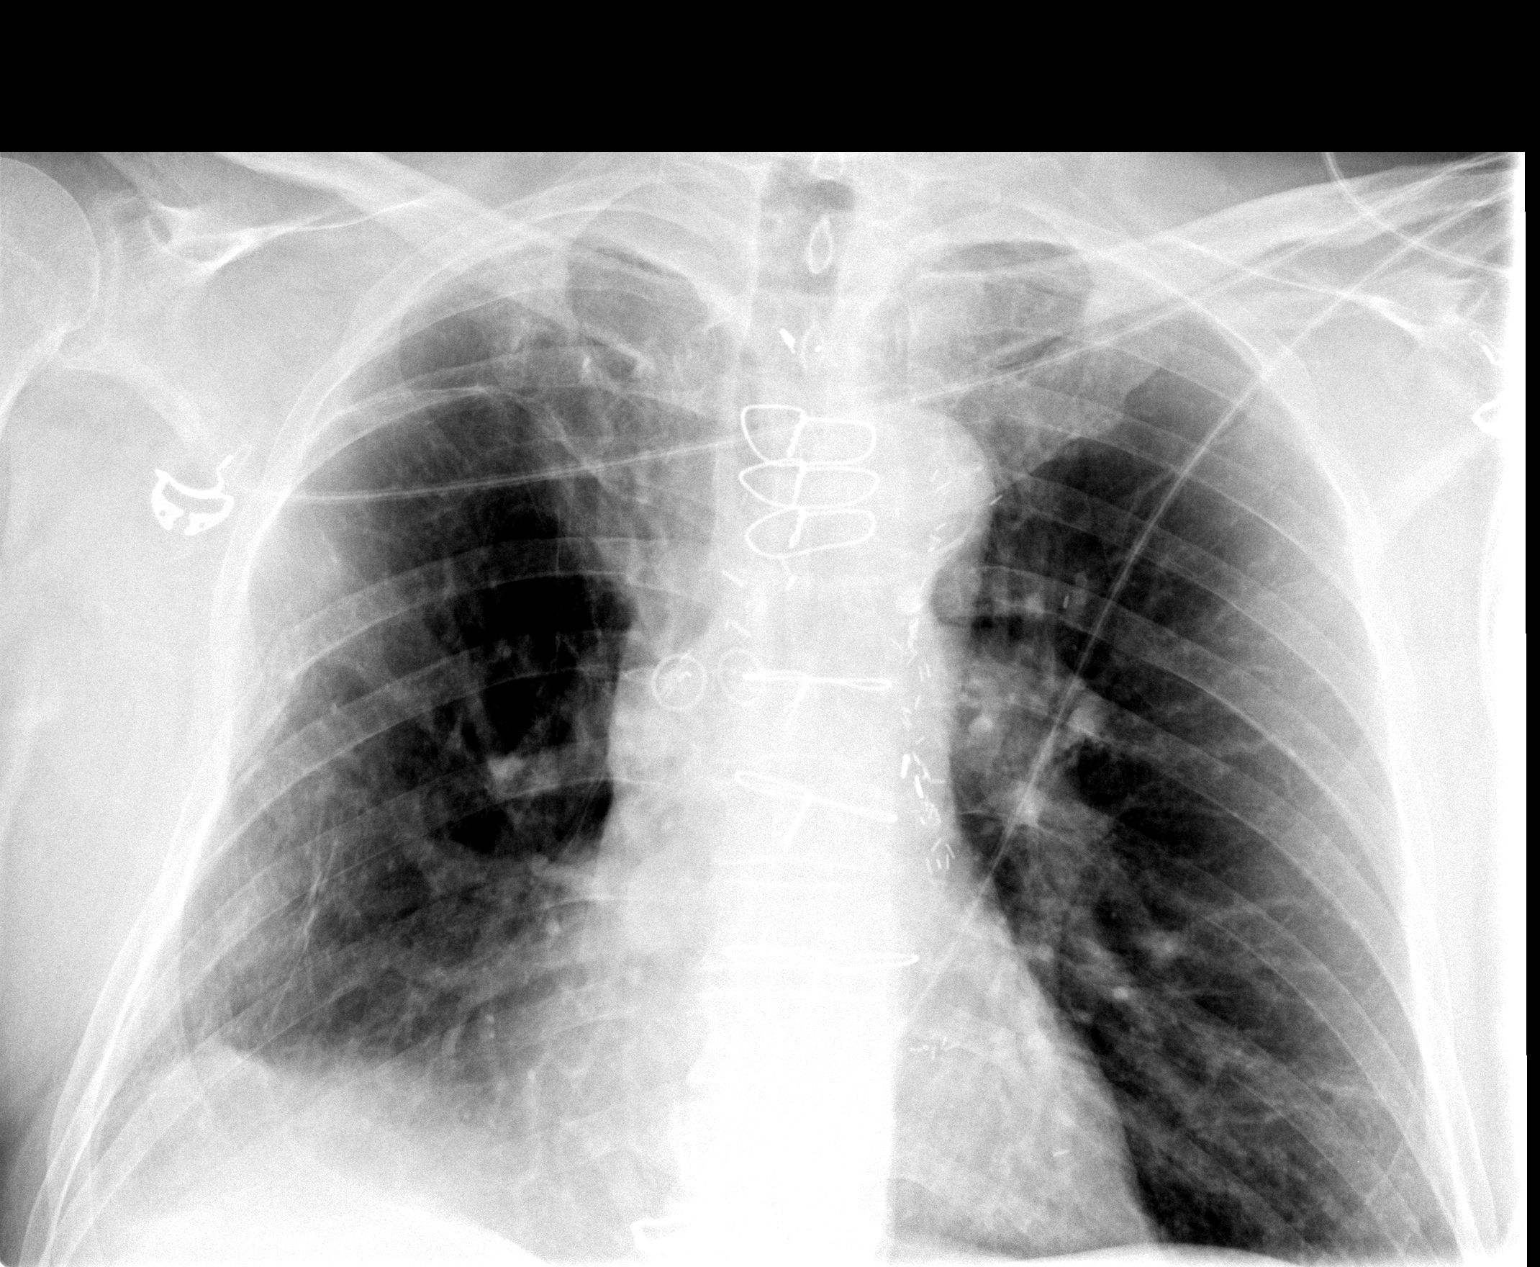

[view not recorded (2 of 2)]
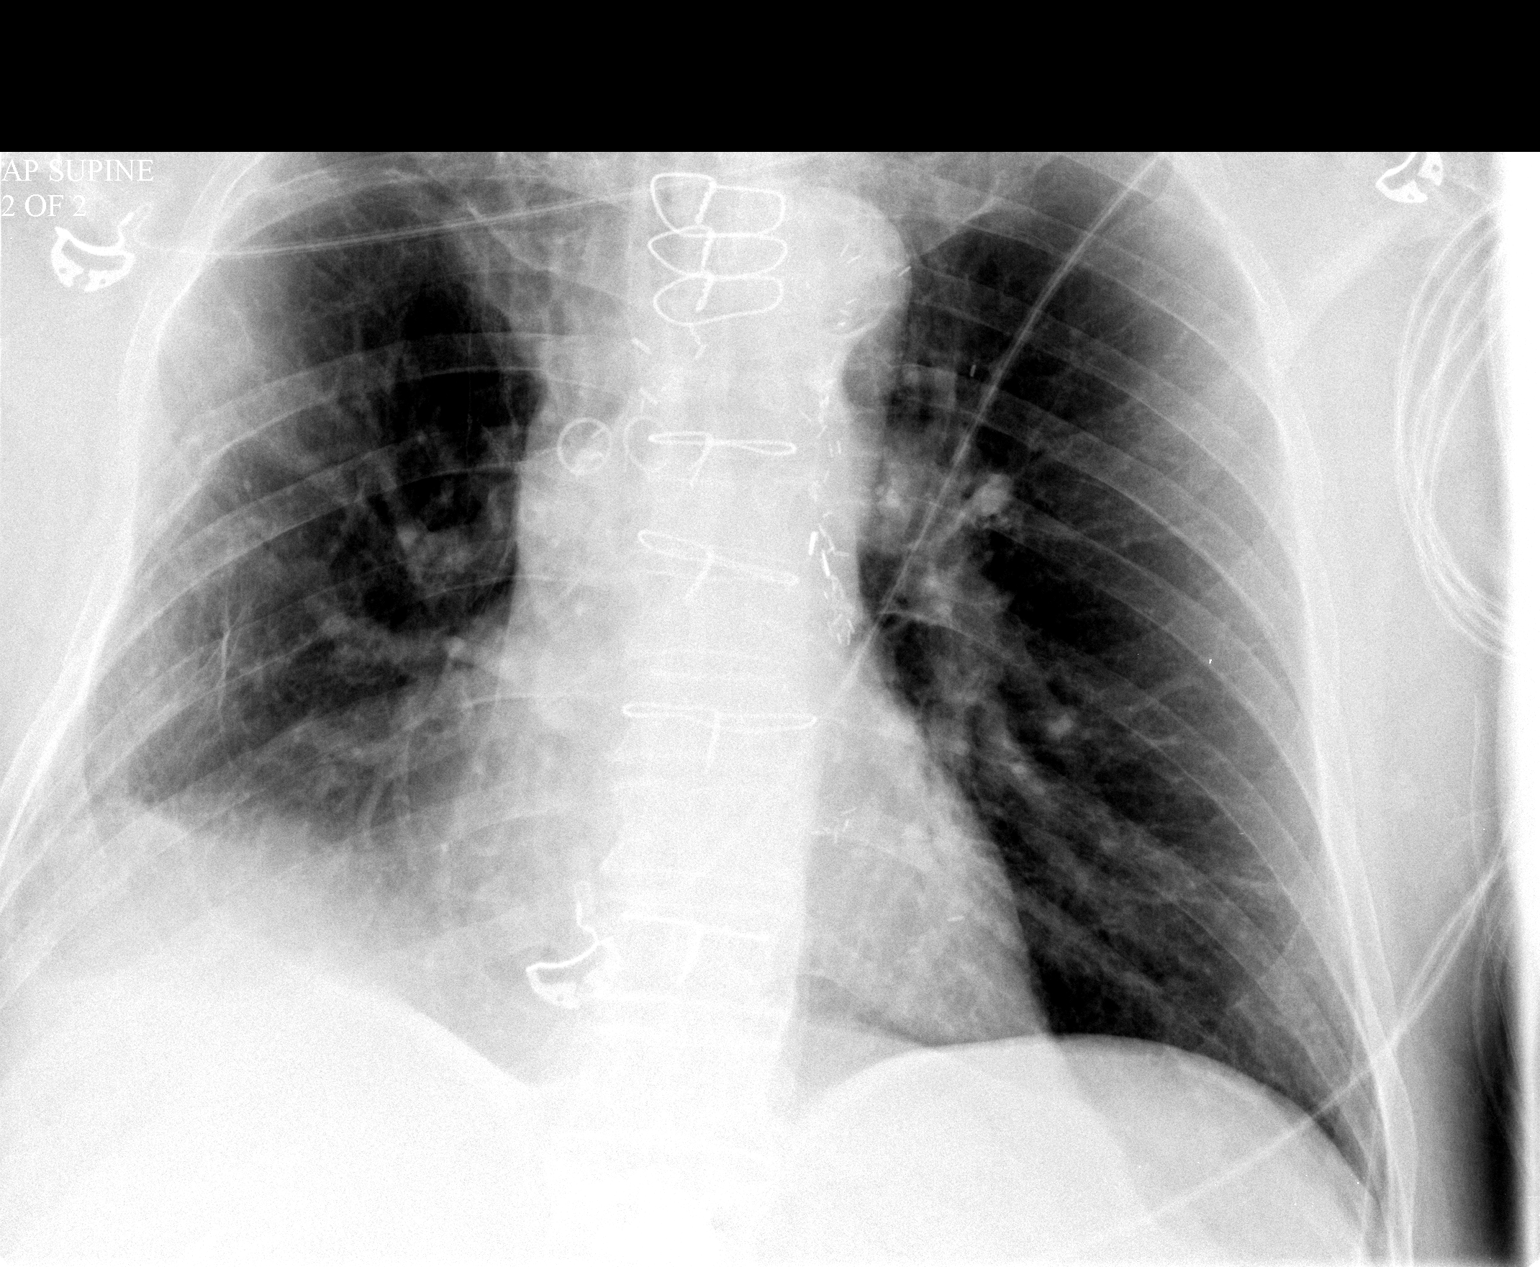

[2 of 2 positions shown; findings below may reference images not displayed]

FINDINGS: There is tenting of right hemidiaphragm unchanged.  There
is no focal infiltrate, pulmonary edema or pleural effusion.  The
patient is status post CABG.  The mediastinal contour and cardiac
silhouette are stable.  Postsurgical changes of the right ribs and
right lung are stable.
IMPRESSION: Chronic changes of right chest.  No acute cardiopulmonary disease
identified.

## 2015-03-01 ENCOUNTER — Other Ambulatory Visit: Payer: Self-pay | Admitting: Cardiology

## 2015-03-15 DIAGNOSIS — D320 Benign neoplasm of cerebral meninges: Secondary | ICD-10-CM | POA: Diagnosis not present

## 2015-03-15 DIAGNOSIS — Z6823 Body mass index (BMI) 23.0-23.9, adult: Secondary | ICD-10-CM | POA: Diagnosis not present

## 2015-03-15 DIAGNOSIS — E1140 Type 2 diabetes mellitus with diabetic neuropathy, unspecified: Secondary | ICD-10-CM | POA: Diagnosis not present

## 2015-03-15 DIAGNOSIS — Z1389 Encounter for screening for other disorder: Secondary | ICD-10-CM | POA: Diagnosis not present

## 2015-03-15 DIAGNOSIS — N393 Stress incontinence (female) (male): Secondary | ICD-10-CM | POA: Diagnosis not present

## 2015-03-15 DIAGNOSIS — E782 Mixed hyperlipidemia: Secondary | ICD-10-CM | POA: Diagnosis not present

## 2015-04-04 IMAGING — CR DG CHEST 2V
3 series · 3 of 3 positions shown · non-contrast
Comparison: 09/30/2012 and 02/18/2012.

CLINICAL DATA: Chest pain.  History prostate and lung cancer.

CHEST - 2 VIEW

[view not recorded (1 of 3)]
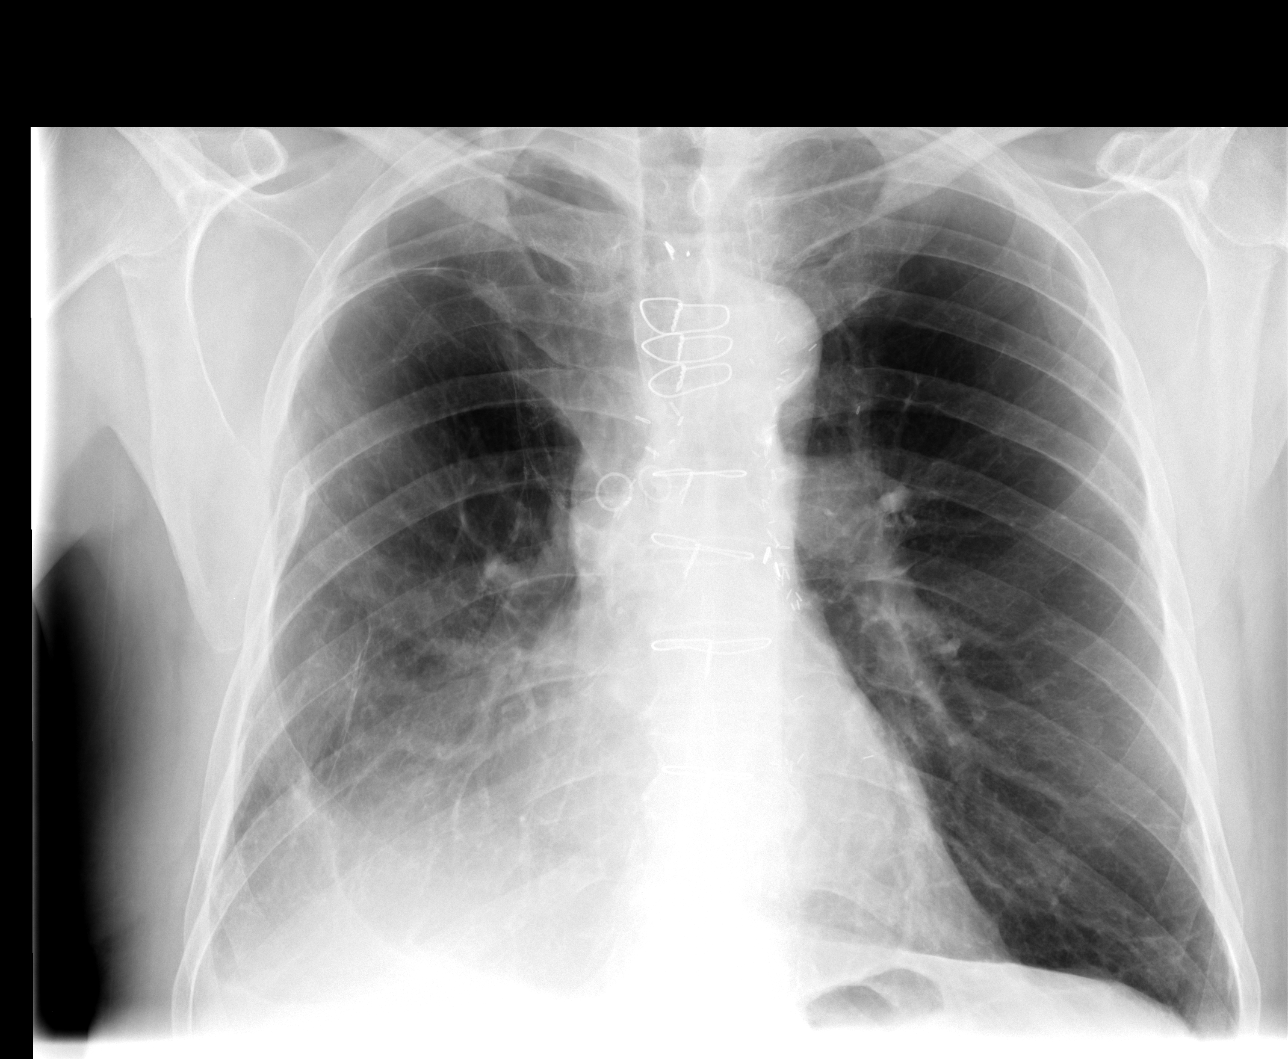

[view not recorded (2 of 3)]
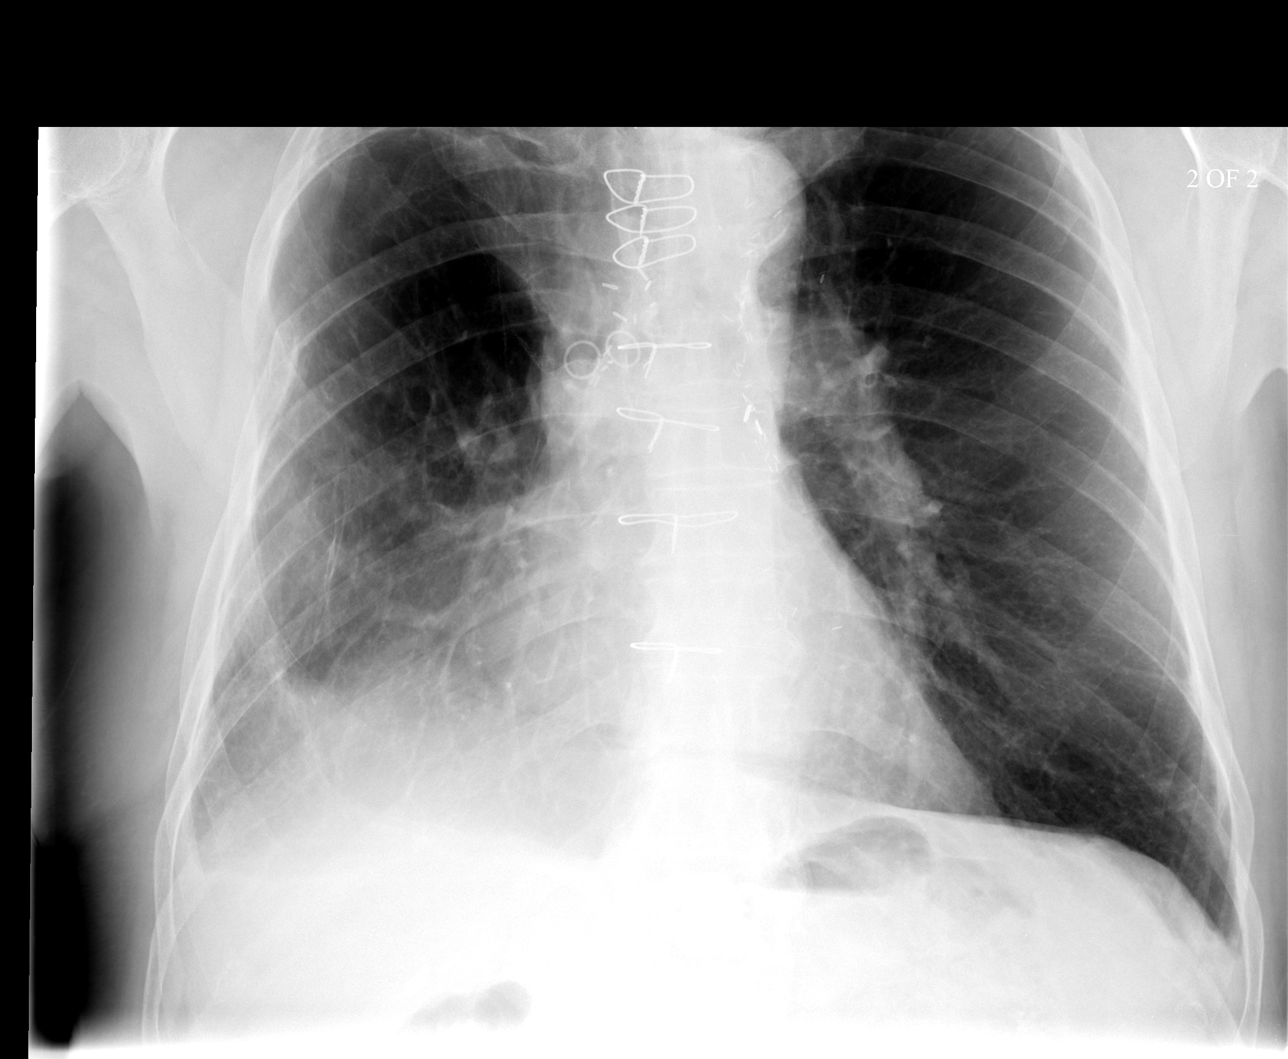

[view not recorded (3 of 3)]
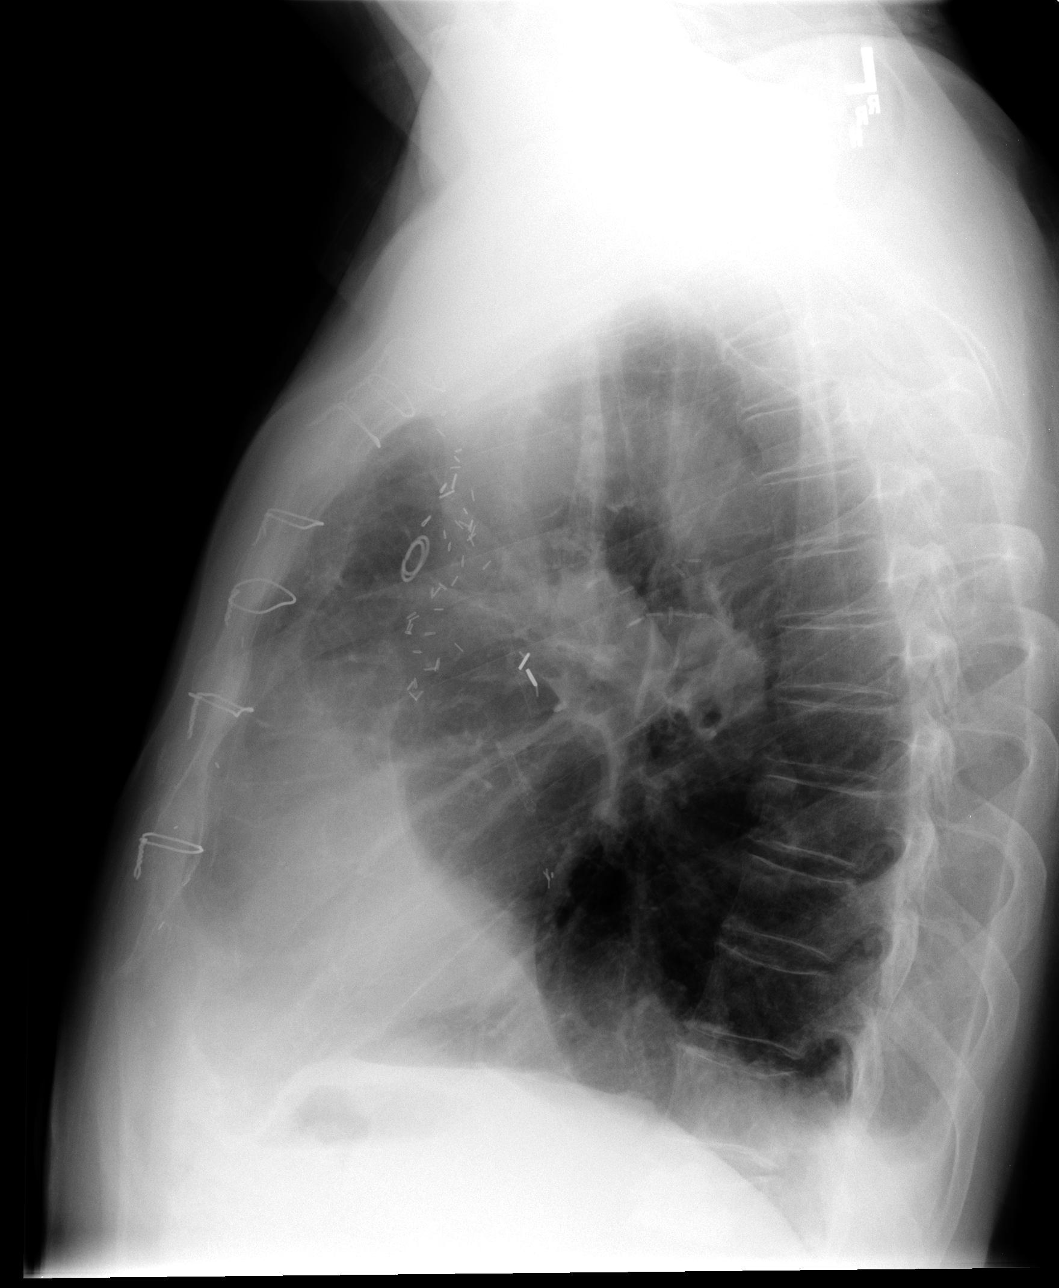

[3 of 3 positions shown; findings below may reference images not displayed]

FINDINGS: Postoperative changes right lung appear relatively
similar to the prior examination. Right apical parenchymal changes
without associated bony destruction and distortion of the right
lung base/diaphragmatic interface are unchanged.

Left base nodule projecting over the anterior aspect of the left
sixth rib and may represent a nipple shadow.  Nipple marker view
would be necessary for further delineation to exclude underlying
lung nodule.

Central wall pulmonary vascular prominence without pulmonary edema.
No segmental consolidation.  No pneumothorax.

Post CABG.  Heart size difficult to adequately assess given the
distorted aspect of the right heart border.  No gross change.

Calcified aorta.
IMPRESSION: Post-therapy changes right lung relatively similar to the
02/18/2012 examination.

Left base nodule projecting over the anterior aspect of the left
sixth rib and may represent a nipple shadow.  Nipple marker view
would be necessary for further delineation to exclude underlying
lung nodule.

## 2015-05-09 ENCOUNTER — Other Ambulatory Visit: Payer: Self-pay | Admitting: Adult Health

## 2015-06-20 DIAGNOSIS — J449 Chronic obstructive pulmonary disease, unspecified: Secondary | ICD-10-CM | POA: Diagnosis not present

## 2015-06-20 DIAGNOSIS — Z1389 Encounter for screening for other disorder: Secondary | ICD-10-CM | POA: Diagnosis not present

## 2015-06-20 DIAGNOSIS — E782 Mixed hyperlipidemia: Secondary | ICD-10-CM | POA: Diagnosis not present

## 2015-06-20 DIAGNOSIS — Z6823 Body mass index (BMI) 23.0-23.9, adult: Secondary | ICD-10-CM | POA: Diagnosis not present

## 2015-06-20 DIAGNOSIS — I10 Essential (primary) hypertension: Secondary | ICD-10-CM | POA: Diagnosis not present

## 2015-07-06 ENCOUNTER — Emergency Department (HOSPITAL_COMMUNITY): Payer: Medicare Other

## 2015-07-06 ENCOUNTER — Encounter (HOSPITAL_COMMUNITY): Payer: Self-pay

## 2015-07-06 ENCOUNTER — Observation Stay (HOSPITAL_COMMUNITY)
Admission: EM | Admit: 2015-07-06 | Discharge: 2015-07-10 | Disposition: A | Discharge status: Home / Self Care | Payer: Medicare Other | Attending: Internal Medicine | Admitting: Internal Medicine

## 2015-07-06 DIAGNOSIS — I959 Hypotension, unspecified: Secondary | ICD-10-CM | POA: Diagnosis not present

## 2015-07-06 DIAGNOSIS — Z79899 Other long term (current) drug therapy: Secondary | ICD-10-CM | POA: Diagnosis not present

## 2015-07-06 DIAGNOSIS — I6789 Other cerebrovascular disease: Secondary | ICD-10-CM | POA: Diagnosis not present

## 2015-07-06 DIAGNOSIS — F419 Anxiety disorder, unspecified: Secondary | ICD-10-CM

## 2015-07-06 DIAGNOSIS — Z7982 Long term (current) use of aspirin: Secondary | ICD-10-CM | POA: Insufficient documentation

## 2015-07-06 DIAGNOSIS — D649 Anemia, unspecified: Secondary | ICD-10-CM | POA: Diagnosis present

## 2015-07-06 DIAGNOSIS — I25810 Atherosclerosis of coronary artery bypass graft(s) without angina pectoris: Secondary | ICD-10-CM | POA: Diagnosis not present

## 2015-07-06 DIAGNOSIS — G9340 Encephalopathy, unspecified: Secondary | ICD-10-CM | POA: Diagnosis not present

## 2015-07-06 DIAGNOSIS — E785 Hyperlipidemia, unspecified: Secondary | ICD-10-CM | POA: Insufficient documentation

## 2015-07-06 DIAGNOSIS — Z794 Long term (current) use of insulin: Secondary | ICD-10-CM

## 2015-07-06 DIAGNOSIS — J449 Chronic obstructive pulmonary disease, unspecified: Secondary | ICD-10-CM | POA: Diagnosis present

## 2015-07-06 DIAGNOSIS — IMO0001 Reserved for inherently not codable concepts without codable children: Secondary | ICD-10-CM

## 2015-07-06 DIAGNOSIS — Z8546 Personal history of malignant neoplasm of prostate: Secondary | ICD-10-CM | POA: Diagnosis not present

## 2015-07-06 DIAGNOSIS — R4182 Altered mental status, unspecified: Secondary | ICD-10-CM | POA: Diagnosis not present

## 2015-07-06 DIAGNOSIS — E119 Type 2 diabetes mellitus without complications: Secondary | ICD-10-CM

## 2015-07-06 DIAGNOSIS — Z87891 Personal history of nicotine dependence: Secondary | ICD-10-CM | POA: Diagnosis not present

## 2015-07-06 LAB — COMPREHENSIVE METABOLIC PANEL
ALK PHOS: 49 U/L (ref 38–126)
ALT: 12 U/L — ABNORMAL LOW (ref 17–63)
AST: 18 U/L (ref 15–41)
Albumin: 3.3 g/dL — ABNORMAL LOW (ref 3.5–5.0)
Anion gap: 2 — ABNORMAL LOW (ref 5–15)
BUN: 16 mg/dL (ref 6–20)
CALCIUM: 8.6 mg/dL — AB (ref 8.9–10.3)
CO2: 28 mmol/L (ref 22–32)
Chloride: 110 mmol/L (ref 101–111)
Creatinine, Ser: 1.1 mg/dL (ref 0.61–1.24)
GFR calc non Af Amer: 59 mL/min — ABNORMAL LOW (ref >60)
Glucose, Bld: 73 mg/dL (ref 65–99)
Potassium: 3.9 mmol/L (ref 3.5–5.1)
Sodium: 140 mmol/L (ref 135–145)
Total Bilirubin: 0.8 mg/dL (ref 0.3–1.2)
Total Protein: 6.1 g/dL — ABNORMAL LOW (ref 6.5–8.1)

## 2015-07-06 LAB — LACTIC ACID, PLASMA
LACTIC ACID, VENOUS: 0.5 mmol/L (ref 0.5–2.0)
Lactic Acid, Venous: 0.7 mmol/L (ref 0.5–2.0)

## 2015-07-06 LAB — URINALYSIS, ROUTINE W REFLEX MICROSCOPIC
Bilirubin Urine: NEGATIVE
Glucose, UA: NEGATIVE mg/dL
Ketones, ur: NEGATIVE mg/dL
Leukocytes, UA: NEGATIVE
NITRITE: NEGATIVE
PROTEIN: NEGATIVE mg/dL
Specific Gravity, Urine: 1.025 (ref 1.005–1.030)
pH: 5.5 (ref 5.0–8.0)

## 2015-07-06 LAB — GLUCOSE, CAPILLARY
GLUCOSE-CAPILLARY: 56 mg/dL — AB (ref 65–99)
Glucose-Capillary: 73 mg/dL (ref 65–99)

## 2015-07-06 LAB — URINE MICROSCOPIC-ADD ON

## 2015-07-06 LAB — ACETAMINOPHEN LEVEL: Acetaminophen (Tylenol), Serum: <10 ug/mL — ABNORMAL LOW (ref 10–30)

## 2015-07-06 LAB — CBC
HCT: 37.3 % — ABNORMAL LOW (ref 39.0–52.0)
Hemoglobin: 12.1 g/dL — ABNORMAL LOW (ref 13.0–17.0)
MCH: 29.7 pg (ref 26.0–34.0)
MCHC: 32.4 g/dL (ref 30.0–36.0)
MCV: 91.4 fL (ref 78.0–100.0)
Platelets: 168 10*3/uL (ref 150–400)
RBC: 4.08 MIL/uL — ABNORMAL LOW (ref 4.22–5.81)
RDW: 13.1 % (ref 11.5–15.5)
WBC: 7.2 10*3/uL (ref 4.0–10.5)

## 2015-07-06 LAB — RAPID URINE DRUG SCREEN, HOSP PERFORMED
AMPHETAMINES: NOT DETECTED
BARBITURATES: NOT DETECTED
Benzodiazepines: POSITIVE — AB
Cocaine: NOT DETECTED
OPIATES: NOT DETECTED
TETRAHYDROCANNABINOL: NOT DETECTED

## 2015-07-06 LAB — SALICYLATE LEVEL: Salicylate Lvl: <4 mg/dL (ref 2.8–30.0)

## 2015-07-06 LAB — TROPONIN I: Troponin I: <0.03 ng/mL (ref <0.031)

## 2015-07-06 LAB — AMMONIA

## 2015-07-06 LAB — TSH: TSH: 0.938 u[IU]/mL (ref 0.350–4.500)

## 2015-07-06 LAB — ETHANOL

## 2015-07-06 LAB — CBG MONITORING, ED: GLUCOSE-CAPILLARY: 67 mg/dL (ref 65–99)

## 2015-07-06 LAB — PROCALCITONIN: Procalcitonin: <0.1 ng/mL

## 2015-07-06 MED ORDER — DEXTROSE-NACL 5-0.45 % IV SOLN
INTRAVENOUS | Status: AC
  Administered 2015-07-06: 75 mL/h via INTRAVENOUS

## 2015-07-06 MED ORDER — ASPIRIN EC 81 MG PO TBEC
81.0000 mg | DELAYED_RELEASE_TABLET | Freq: Every day | ORAL | Status: DC
  Administered 2015-07-07 – 2015-07-10 (×4): 81 mg via ORAL
  Filled 2015-07-06 (×4): qty 1

## 2015-07-06 MED ORDER — ONDANSETRON HCL 4 MG PO TABS: 4.0000 mg | ORAL_TABLET | Freq: Four times a day (QID) | ORAL | Status: DC | PRN

## 2015-07-06 MED ORDER — ONDANSETRON HCL 4 MG/2ML IJ SOLN: 4.0000 mg | Freq: Four times a day (QID) | INTRAMUSCULAR | Status: DC | PRN

## 2015-07-06 MED ORDER — SODIUM CHLORIDE 0.9 % IV BOLUS (SEPSIS)
500.0000 mL | Freq: Once | INTRAVENOUS | Status: AC
  Administered 2015-07-06: 500 mL via INTRAVENOUS

## 2015-07-06 MED ORDER — SODIUM CHLORIDE 0.9 % IV SOLN
INTRAVENOUS | Status: DC
  Administered 2015-07-06: 15:00:00 via INTRAVENOUS

## 2015-07-06 MED ORDER — HYDROCODONE-ACETAMINOPHEN 10-325 MG PO TABS: 1.0000 | ORAL_TABLET | ORAL | Status: DC | PRN

## 2015-07-06 MED ORDER — PRAVASTATIN SODIUM 40 MG PO TABS
80.0000 mg | ORAL_TABLET | Freq: Every day | ORAL | Status: DC
  Administered 2015-07-08 – 2015-07-10 (×3): 80 mg via ORAL
  Filled 2015-07-06 (×6): qty 2
  Filled 2015-07-06: qty 1

## 2015-07-06 MED ORDER — FLUTICASONE PROPIONATE 50 MCG/ACT NA SUSP
2.0000 | Freq: Every day | NASAL | Status: DC
  Administered 2015-07-07 – 2015-07-10 (×4): 2 via NASAL
  Filled 2015-07-06 (×2): qty 16

## 2015-07-06 MED ORDER — ALPRAZOLAM 0.5 MG PO TABS: 1.0000 mg | ORAL_TABLET | Freq: Three times a day (TID) | ORAL | Status: DC | PRN

## 2015-07-06 MED ORDER — ACETAMINOPHEN 650 MG RE SUPP: 650.0000 mg | Freq: Four times a day (QID) | RECTAL | Status: DC | PRN

## 2015-07-06 MED ORDER — INSULIN ASPART 100 UNIT/ML ~~LOC~~ SOLN
0.0000 [IU] | Freq: Three times a day (TID) | SUBCUTANEOUS | Status: DC
  Administered 2015-07-10 (×3): 1 [IU] via SUBCUTANEOUS

## 2015-07-06 MED ORDER — SODIUM CHLORIDE 0.9% FLUSH
3.0000 mL | Freq: Two times a day (BID) | INTRAVENOUS | Status: DC
  Administered 2015-07-06 – 2015-07-09 (×4): 3 mL via INTRAVENOUS

## 2015-07-06 MED ORDER — OMEGA-3-ACID ETHYL ESTERS 1 G PO CAPS
1.0000 g | ORAL_CAPSULE | Freq: Every day | ORAL | Status: DC
  Administered 2015-07-07 – 2015-07-10 (×4): 1 g via ORAL
  Filled 2015-07-06 (×4): qty 1

## 2015-07-06 MED ORDER — ENOXAPARIN SODIUM 40 MG/0.4ML ~~LOC~~ SOLN
40.0000 mg | SUBCUTANEOUS | Status: DC
  Administered 2015-07-06 – 2015-07-09 (×3): 40 mg via SUBCUTANEOUS
  Filled 2015-07-06 (×2): qty 0.4

## 2015-07-06 MED ORDER — ACETAMINOPHEN 325 MG PO TABS
650.0000 mg | ORAL_TABLET | Freq: Four times a day (QID) | ORAL | Status: DC | PRN
  Administered 2015-07-08: 650 mg via ORAL
  Filled 2015-07-06: qty 2

## 2015-07-06 MED ORDER — SODIUM CHLORIDE 0.9 % IV SOLN
INTRAVENOUS | Status: DC
  Administered 2015-07-06: 21:00:00 via INTRAVENOUS

## 2015-07-06 MED ORDER — OXYBUTYNIN CHLORIDE 5 MG PO TABS
5.0000 mg | ORAL_TABLET | Freq: Two times a day (BID) | ORAL | Status: DC
  Administered 2015-07-06 – 2015-07-10 (×7): 5 mg via ORAL
  Filled 2015-07-06 (×7): qty 1

## 2015-07-06 NOTE — ED Notes (Signed)
Wife to call is Sunday Spillers: [phone].
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
## 2015-07-06 NOTE — ED Notes (Signed)
MD at bedside. 
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
## 2015-07-06 NOTE — ED Notes (Signed)
Thomas [name]-Patients neighbor states you can call her when patient is ready to be d/c. Phone #: [phone] - leave message if no answer.

## 2015-07-06 NOTE — Progress Notes (Addendum)
Hypoglycemic Event  CBG: 56  Treatment: 15 GM carbohydrate snack  Symptoms: None  Follow-up CBG: Time:2145 CBG Result:73-given juice  Possible Reasons for Event: Other: possible wrong lantus dose given by pt PTA  Comments/MD notified:Opyd     Ja Pistole L
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
## 2015-07-06 NOTE — ED Notes (Signed)
Patients neighbors at bedside encouraging patient to stay. Nigel Sloop are neighbors. Patient agreeing to stay with their encouragement. Patient gave his car key to Ron to take him car home. Per pt car is at [employer]. Patient pleasant and cooperative.

## 2015-07-07 ENCOUNTER — Observation Stay (HOSPITAL_BASED_OUTPATIENT_CLINIC_OR_DEPARTMENT_OTHER): Payer: Medicare Other

## 2015-07-07 DIAGNOSIS — G9340 Encephalopathy, unspecified: Secondary | ICD-10-CM

## 2015-07-07 DIAGNOSIS — R4182 Altered mental status, unspecified: Secondary | ICD-10-CM | POA: Diagnosis not present

## 2015-07-07 DIAGNOSIS — E119 Type 2 diabetes mellitus without complications: Secondary | ICD-10-CM | POA: Diagnosis not present

## 2015-07-07 DIAGNOSIS — I2510 Atherosclerotic heart disease of native coronary artery without angina pectoris: Secondary | ICD-10-CM

## 2015-07-07 DIAGNOSIS — E785 Hyperlipidemia, unspecified: Secondary | ICD-10-CM | POA: Diagnosis not present

## 2015-07-07 DIAGNOSIS — I959 Hypotension, unspecified: Secondary | ICD-10-CM | POA: Diagnosis not present

## 2015-07-07 LAB — GLUCOSE, CAPILLARY
GLUCOSE-CAPILLARY: 104 mg/dL — AB (ref 65–99)
GLUCOSE-CAPILLARY: 131 mg/dL — AB (ref 65–99)
GLUCOSE-CAPILLARY: 84 mg/dL (ref 65–99)
Glucose-Capillary: 97 mg/dL (ref 65–99)
Glucose-Capillary: 115 mg/dL — ABNORMAL HIGH (ref 65–99)

## 2015-07-07 LAB — BASIC METABOLIC PANEL
Anion gap: 4 — ABNORMAL LOW (ref 5–15)
BUN: 12 mg/dL (ref 6–20)
CALCIUM: 8.6 mg/dL — AB (ref 8.9–10.3)
CO2: 26 mmol/L (ref 22–32)
Chloride: 108 mmol/L (ref 101–111)
Creatinine, Ser: 0.91 mg/dL (ref 0.61–1.24)
GFR calc Af Amer: >60 mL/min (ref >60)
GLUCOSE: 109 mg/dL — AB (ref 65–99)
Potassium: 3.5 mmol/L (ref 3.5–5.1)
Sodium: 138 mmol/L (ref 135–145)

## 2015-07-07 LAB — CBC
HEMATOCRIT: 35.1 % — AB (ref 39.0–52.0)
Hemoglobin: 11.6 g/dL — ABNORMAL LOW (ref 13.0–17.0)
MCH: 30.1 pg (ref 26.0–34.0)
MCHC: 33 g/dL (ref 30.0–36.0)
MCV: 90.9 fL (ref 78.0–100.0)
Platelets: 140 10*3/uL — ABNORMAL LOW (ref 150–400)
RBC: 3.86 MIL/uL — ABNORMAL LOW (ref 4.22–5.81)
RDW: 13.1 % (ref 11.5–15.5)
WBC: 4.2 10*3/uL (ref 4.0–10.5)

## 2015-07-07 LAB — VITAMIN B12: VITAMIN B 12: 110 pg/mL — AB (ref 180–914)

## 2015-07-07 LAB — ECHOCARDIOGRAM COMPLETE
HEIGHTINCHES: 71 in
Weight: 2603.19 oz

## 2015-07-07 LAB — IRON AND TIBC
IRON: 75 ug/dL (ref 45–182)
Saturation Ratios: 31 % (ref 17.9–39.5)
TIBC: 244 ug/dL — AB (ref 250–450)
UIBC: 169 ug/dL

## 2015-07-07 LAB — MRSA PCR SCREENING: MRSA by PCR: NEGATIVE

## 2015-07-07 LAB — FERRITIN: FERRITIN: 31 ng/mL (ref 24–336)

## 2015-07-07 LAB — TROPONIN I: Troponin I: <0.03 ng/mL (ref <0.031)

## 2015-07-07 MED ORDER — LORAZEPAM 2 MG/ML IJ SOLN
1.0000 mg | INTRAMUSCULAR | Status: DC | PRN
  Administered 2015-07-07: 1 mg via INTRAVENOUS
  Filled 2015-07-07: qty 1

## 2015-07-07 NOTE — Progress Notes (Signed)
Pt becoming increasingly agitated/combative, swinging and kicking RNs in room. Pt had condom cath on and kicked off so RN assisting pt to use urinal. MD [place] notified and '1mg'$  Ativan IV ordered PRN q4. Pt is calm and resting at this time, no PRN ativan given. Agitation resolved after pt urinated/ blankets re-applied. Will cont to monitor.  Mitzy Naron L

## 2015-07-07 NOTE — Evaluation (Signed)
Physical Therapy Evaluation Patient Details Name: Thomas [name] MRN: [id number] DOB: 06/19/1927 Today's Date: 07/07/2015   History of Present Illness  82 yo M Admitted with AMS and found wandering in the road, unable to tell Firemen his name or where he was going. Acute encephalopathy with hypotension.  PMH: DM, angina pain, CA, lung CA, prostate CA, CAD, back surgery, tumor removal from lung, prostate surgery, CABG, surgery to repair aneurysm. Cares for bed ridden wife.   Clinical Impression  Pt received in bed, and was agreeable to PT evaluation.  Pt is not a good historian due to altered mental status, but states that he lives with his wife, and is independent with ADL's, IADL's, and ambulation.  Today, he demonstrates bed mobility and transfers at Mod (I) level, and ambulated 236f with supervision due to pt stating he was going to walk out the doors.  Pt does not demonstrate any LOB or gait deviations at this time, and seems to be at his baseline level of mobility.  Therefore, no skilled PT needs at this time, will sign off.  Recommend 24/7 supervision at home due to continued altered mental status.     Follow Up Recommendations No PT follow up;Supervision/Assistance - 24 hour    Equipment Recommendations  None recommended by PT    Recommendations for Other Services       Precautions / Restrictions Restrictions Weight Bearing Restrictions: No      Mobility  Bed Mobility Overal bed mobility: Modified Independent                Transfers Overall transfer level: Modified independent                  Ambulation/Gait Ambulation/Gait assistance: Supervision Ambulation Distance (Feet): 200 Feet Assistive device: None Gait Pattern/deviations: WFL(Within Functional Limits)     General Gait Details: Pt stopped to talk to some nurses in the hallway, asking when he was going to be discharged.  Pt ambulating at a good cadence, no LOB  Stairs             Wheelchair Mobility    Modified Rankin (Stroke Patients Only)       Balance Overall balance assessment: No apparent balance deficits (not formally assessed)                                           Pertinent Vitals/Pain Pain Assessment: No/denies pain    Home Living   Living Arrangements: Spouse/significant other   Type of Home: House Home Access: Level entry     Home Layout: One level Home Equipment: None      Prior Function Level of Independence: Independent               Hand Dominance        Extremity/Trunk Assessment   Upper Extremity Assessment: Overall WFL for tasks assessed           Lower Extremity Assessment: Overall WFL for tasks assessed         Communication      Cognition Arousal/Alertness: Awake/alert Behavior During Therapy: Impulsive Overall Cognitive Status: Difficult to assess (Pt perseverating on when he is being discharged, and having someone call his wife to come pick him up.  )  General Comments      Exercises        Assessment/Plan    PT Assessment Patent does not need any further PT services  PT Diagnosis Difficulty walking   PT Problem List    PT Treatment Interventions     PT Goals (Current goals can be found in the Care Plan section) Acute Rehab PT Goals Patient Stated Goal: Pt wants to go home to be with his wife.  PT Goal Formulation: With patient Time For Goal Achievement: 07/14/15 Potential to Achieve Goals: Good    Frequency     Barriers to discharge        Co-evaluation               End of Session Equipment Utilized During Treatment: Gait belt Activity Tolerance: Patient tolerated treatment well Patient left: in bed;with bed alarm set (Pt stating he's going to walk out the door, and he knows how to get to [facility] from here. ) Nurse Communication: Mobility status    Functional Assessment Tool Used: [employer]  "6-clicks" Functional Limitation: Mobility: Walking and moving around Mobility: Walking and Moving Around Current Status [phone]): At least 1 percent but less than 20 percent impaired, limited or restricted Mobility: Walking and Moving Around Goal Status [phone]): At least 1 percent but less than 20 percent impaired, limited or restricted Mobility: Walking and Moving Around Discharge Status [phone]): At least 1 percent but less than 20 percent impaired, limited or restricted    Time: 1415-1442 PT Time Calculation (min) (ACUTE ONLY): 27 min   Charges:   PT Evaluation $PT Eval Low Complexity: 1 Procedure PT Treatments $Gait Training: 8-22 mins   PT G Codes:   PT G-Codes **NOT FOR INPATIENT CLASS** Functional Assessment Tool Used: [employer] "6-clicks" Functional Limitation: Mobility: Walking and moving around Mobility: Walking and Moving Around Current Status [phone]): At least 1 percent but less than 20 percent impaired, limited or restricted Mobility: Walking and Moving Around Goal Status [phone]): At least 1 percent but less than 20 percent impaired, limited or restricted Mobility: Walking and Moving Around Discharge Status [phone]): At least 1 percent but less than 20 percent impaired, limited or restricted    Tacy Learn, PT, DPT X: [id number]   07/07/2015, 3:33 PM

## 2015-07-07 NOTE — Care Management Note (Signed)
Case Management Note  Patient Details  Name: Thomas [name] MRN: [id number] Date of Birth: 01/10/1928  Subjective/Objective:                  Pt from home with AMS. Pt is confused and information received from wife via phone. Pt is ind with ADL's at home but is with dementia and confused most of the time. Pt's wife states pt refuses to use cane or walker. Pt has used [facility] services in the past and would like them again. Pt's wife refuses HH PT services but is interested in Therapist, sports. Blake Divine of [facility], made aware of referral and will obtain pt info from chart. Pt's wife aware that [facility] has 48 hours to resume services. Pt's wife states she wants husband to return home tomorrow.   Action/Plan: Anticipate DC home over weekend with [facility] services through [facility].   Expected Discharge Date:      07/08/2015            Expected Discharge Plan:  [place]  In-House Referral:  NA  Discharge planning Services  CM Consult  Post Acute Care Choice:  Home Health Choice offered to:  Patient  DME Arranged:    DME Agency:     HH Arranged:  RN [place] Agency:  [place]  Status of Service:  Completed, signed off  Medicare Important Message Given:    Date Medicare IM Given:    Medicare IM give by:    Date Additional Medicare IM Given:    Additional Medicare Important Message give by:     If discussed at [place] of Stay Meetings, dates discussed:    Additional Comments:  Sherald Barge, RN 07/07/2015, 2:53 PM

## 2015-07-07 NOTE — Progress Notes (Signed)
PROGRESS NOTE    Thomas [name]  GXQ:[id number] DOB: Apr 20, 1927 DOA: 07/06/2015 PCP: Glo Herring., MD     Brief Narrative:  82 year old man admitted on 6/1 after found wandering on the streets and confused.   Assessment & Plan:   Principal Problem:   Acute encephalopathy Active Problems:   Insulin dependent diabetes mellitus (HCC)   CAD, ARTERY BYPASS GRAFT   COPD (chronic obstructive pulmonary disease) (HCC)   Normocytic anemia   Hypotension   Anxiety   Acute encephalopathy/delirium -Etiology is uncertain. -CT scan of the head was negative, no focal deficits to suggest CVA. -Chest x-ray and UA negative for source of infection. -Wonder if polypharmacy and difficulty taking medications may be playing a role. -Tox screen is only positive for benzodiazepines which she is prescribed.  -Was hypotensive on admission this has resolved with the fluid bolus.  Diabetes mellitus -Well-controlled at present.    DVT propLovenox Code Status: Full Code Family Communication: Wife Sunday Spillers Disposition Plan: to be determined pending clinical improvement  Consultants:   None  Procedures:   None  Antimicrobials:   None    Subjective: At time of exam, lying in bed, seems surprised when informed he was found wandering the streets and confused.  Objective: Filed Vitals:   07/07/15 1400 07/07/15 1500 07/07/15 1600 07/07/15 1618  BP: 132/60 115/69 123/82   Pulse:   65   Temp:    97.3 F (36.3 C)  TempSrc:    Oral  Resp: '14 16 15   '$ Height:      Weight:      SpO2:   100%     Intake/Output Summary (Last 24 hours) at 07/07/15 1708 Last data filed at 07/07/15 1416  Gross per 24 hour  Intake 1061.25 ml  Output   1600 ml  Net -538.75 ml   Filed Weights   07/06/15 1356 07/06/15 2100  Weight: 77.111 kg (170 lb) 73.8 kg (162 lb 11.2 oz)    Examination:  General examSeems confused Respiratory system: Clear to auscultation. Respiratory effort  normal. Cardiovascular system:RRR. No murmurs, rubs, gallops. Gastrointestinal system: Abdomen is nondistended, soft and nontender. No organomegaly or masses felt. Normal bowel sounds heard. Central nervous system: . No focal neurological deficits. Extremities: No C/C/E, +pedal pulses Skin: No rashes, lesions or ulcers Psychiatry:  and able to fully assess given current mental state     Data Reviewed: I have personally reviewed following labs and imaging studies  CBC:  Recent Labs Lab 07/06/15 1500 07/07/15 0500  WBC 7.2 4.2  HGB 12.1* 11.6*  HCT 37.3* 35.1*  MCV 91.4 90.9  PLT 168 144*   Basic Metabolic Panel:  Recent Labs Lab 07/06/15 1500 07/07/15 0307  NA 140 138  K 3.9 3.5  CL 110 108  CO2 28 26  GLUCOSE 73 109*  BUN 16 12  CREATININE 1.10 0.91  CALCIUM 8.6* 8.6*   GFR: Estimated Creatinine Clearance: 62 mL/min (by C-G formula based on Cr of 0.91). Liver Function Tests:  Recent Labs Lab 07/06/15 1500  AST 18  ALT 12*  ALKPHOS 49  BILITOT 0.8  PROT 6.1*  ALBUMIN 3.3*   No results for input(s): LIPASE, AMYLASE in the last 168 hours.  Recent Labs Lab 07/06/15 2030  AMMONIA <9*   Coagulation Profile: No results for input(s): INR, PROTIME in the last 168 hours. Cardiac Enzymes:  Recent Labs Lab 07/06/15 1520 07/06/15 2030 07/07/15 0307 07/07/15 0833  TROPONINI <0.03 <0.03 <0.03 <0.03   BNP (last  3 results) No results for input(s): PROBNP in the last 8760 hours. HbA1C: No results for input(s): HGBA1C in the last 72 hours. CBG:  Recent Labs Lab 07/06/15 2150 07/07/15 0341 07/07/15 0727 07/07/15 1145 07/07/15 1630  GLUCAP 73 104* 97 115* 131*   Lipid Profile: No results for input(s): CHOL, HDL, LDLCALC, TRIG, CHOLHDL, LDLDIRECT in the last 72 hours. Thyroid Function Tests:  Recent Labs  07/06/15 1500  TSH 0.938   Anemia Panel:  Recent Labs  07/06/15 1502 07/07/15 0307  VITAMINB12 110*  --   FERRITIN  --  31  TIBC  --   244*  IRON  --  75   Urine analysis:    Component Value Date/Time   COLORURINE YELLOW 07/06/2015 [place] 07/06/2015 1705   LABSPEC 1.025 07/06/2015 1705   PHURINE 5.5 07/06/2015 1705   GLUCOSEU NEGATIVE 07/06/2015 1705   HGBUR SMALL* 07/06/2015 1705   BILIRUBINUR NEGATIVE 07/06/2015 1705   KETONESUR NEGATIVE 07/06/2015 1705   PROTEINUR NEGATIVE 07/06/2015 1705   UROBILINOGEN 0.2 11/13/2012 1238   NITRITE NEGATIVE 07/06/2015 1705   LEUKOCYTESUR NEGATIVE 07/06/2015 1705   Sepsis Labs: '@LABRCNTIP'$ (procalcitonin:4,lacticidven:4)  ) Recent Results (from the past 240 hour(s))  MRSA PCR Screening     Status: None   Collection Time: 07/06/15  8:50 PM  Result Value Ref Range Status   MRSA by PCR NEGATIVE NEGATIVE Final    Comment:        The GeneXpert MRSA Assay (FDA approved for NASAL specimens only), is one component of a comprehensive MRSA colonization surveillance program. It is not intended to diagnose MRSA infection nor to guide or monitor treatment for MRSA infections.          Radiology Studies: Dg Chest 2 View  07/06/2015  CLINICAL DATA:  History of lung and prostate cancer. Altered mental status. EXAM: CHEST  2 VIEW COMPARISON:  01/12/2015 FINDINGS: Postsurgical changes from CABG and probable right lobectomy are stable. Cardiomediastinal silhouette is normal. There is no evidence of focal airspace consolidation, pleural effusion or pneumothorax. Osseous structures are without acute abnormality. Soft tissues are grossly normal. Healed right-sided rib fracture is noted. IMPRESSION: Stable postsurgical changes in the thorax. No evidence of acute consolidation. Electronically Signed   By: Fidela Salisbury M.D.   On: 07/06/2015 14:49   Ct Head Wo Contrast  07/06/2015  CLINICAL DATA:  Altered mental status. EXAM: CT HEAD WITHOUT CONTRAST TECHNIQUE: Contiguous axial images were obtained from the base of the skull through the vertex without intravenous  contrast. COMPARISON:  CT scan of September 07, 2012. FINDINGS: Bony calvarium appears intact. Mild diffuse cortical atrophy is noted. Mild chronic ischemic white matter disease is noted. Stable calcified meningioma is noted in right frontal region. No mass effect or midline shift is noted. Ventricular size is within normal limits. There is no evidence of mass lesion, hemorrhage or acute infarction. IMPRESSION: Mild diffuse cortical atrophy. Mild chronic ischemic white matter disease. Stable calcified right frontal meningioma. No acute intracranial abnormality seen. Electronically Signed   By: Marijo Conception, M.D.   On: 07/06/2015 14:59        Scheduled Meds: . aspirin EC  81 mg Oral Daily  . enoxaparin (LOVENOX) injection  40 mg Subcutaneous Q24H  . fluticasone  2 spray Each Nare Daily  . insulin aspart  0-9 Units Subcutaneous TID WC  . omega-3 acid ethyl esters  1 g Oral Daily  . oxybutynin  5 mg Oral BID  .  pravastatin  80 mg Oral q1800  . sodium chloride flush  3 mL Intravenous Q12H   Continuous Infusions:       Time spen25 minutes. Greater than 50% of this time was spent in direct contact with the patient coordinating care.     Lelon Frohlich, MD [facility] Pager [phone]  If 7PM-7AM, please contact night-coverage [email or web address] Password [email or web address] 07/07/2015, 5:08 PM

## 2015-07-07 NOTE — Progress Notes (Signed)
Echocardiogram 2D Echocardiogram has been performed.  Joelene Millin 07/07/2015, 12:34 PM

## 2015-07-07 NOTE — Care Management Obs Status (Signed)
[facility] NOTIFICATION   Patient Details  Name: Thomas [name] MRN: [id number] Date of Birth: [birth year]   Medicare Observation Status Notification Given:  Yes    Sherald Barge, RN 07/07/2015, 2:52 PM

## 2015-07-08 DIAGNOSIS — G9340 Encephalopathy, unspecified: Secondary | ICD-10-CM | POA: Diagnosis not present

## 2015-07-08 LAB — HEMOGLOBIN A1C
Hgb A1c MFr Bld: 6 % — ABNORMAL HIGH (ref 4.8–5.6)
Mean Plasma Glucose: 126 mg/dL

## 2015-07-08 LAB — GLUCOSE, CAPILLARY
GLUCOSE-CAPILLARY: 83 mg/dL (ref 65–99)
GLUCOSE-CAPILLARY: 102 mg/dL — AB (ref 65–99)
Glucose-Capillary: 81 mg/dL (ref 65–99)
Glucose-Capillary: 77 mg/dL (ref 65–99)
Glucose-Capillary: 90 mg/dL (ref 65–99)

## 2015-07-08 LAB — PROCALCITONIN: Procalcitonin: <0.1 ng/mL

## 2015-07-08 LAB — URINE CULTURE

## 2015-07-08 NOTE — Progress Notes (Signed)
CM to follow up with patient and family about [facility] services if needed.

## 2015-07-08 NOTE — Progress Notes (Signed)
Please call patients wife at home number. [phone] today 07/09/15

## 2015-07-08 NOTE — Progress Notes (Signed)
PROGRESS NOTE    Thomas [name]  FTD:[id number] DOB: 02/02/1928 DOA: 07/06/2015 PCP: Glo Herring., MD     Brief Narrative:  82 year old man admitted on 6/1 after found wandering on the streets and confused.   Assessment & Plan:   Principal Problem:   Acute encephalopathy Active Problems:   Insulin dependent diabetes mellitus (HCC)   CAD, ARTERY BYPASS GRAFT   COPD (chronic obstructive pulmonary disease) (HCC)   Normocytic anemia   Hypotension   Anxiety   Acute encephalopathy/delirium -Etiology is uncertain. -CT scan of the head was negative, no focal deficits to suggest CVA. -Chest x-ray and UA negative for source of infection. -Wonder if polypharmacy and difficulty taking medications may be playing a role. -Tox screen is only positive for benzodiazepines which she is prescribed.  -Was hypotensive on admission this has resolved with the fluid bolus. -After further history obtained from wife, I strongly believe that Thomas [name] may have dementia and may be having some delirium/sundowning while in the hospital.  -Will request a psychiatry evaluation for further assistance, especially with medications for delirium.  Diabetes mellitus -Well-controlled at present.    DVT prophylaxis: Lovenox Code Status: Full Code Family Communication: Wife Sunday Spillers via phone Disposition Plan: to be determined pending clinical improvement, would strongly benefit from SNF as I believe he is at danger at home given his propensity to wandering and getting lost.  Consultants:   None  Procedures:   None  Antimicrobials:   None    Subjective:   Objective: Filed Vitals:   07/08/15 0546 07/08/15 0600 07/08/15 0847 07/08/15 1225  BP:  140/88    Pulse:      Temp:   98.9 F (37.2 C) 98.6 F (37 C)  TempSrc:   Axillary Oral  Resp:  17    Height:      Weight:      SpO2: 91%       Intake/Output Summary (Last 24 hours) at 07/08/15 1534 Last data filed at 07/08/15  1334  Gross per 24 hour  Intake    120 ml  Output   1150 ml  Net  -1030 ml   Filed Weights   07/06/15 1356 07/06/15 2100 07/08/15 0534  Weight: 77.111 kg (170 lb) 73.8 kg (162 lb 11.2 oz) 71.6 kg (157 lb 13.6 oz)    Examination:  General examSeems confused Respiratory system: Clear to auscultation. Respiratory effort normal. Cardiovascular system:RRR. No murmurs, rubs, gallops. Gastrointestinal system: Abdomen is nondistended, soft and nontender. No organomegaly or masses felt. Normal bowel sounds heard. Central nervous system: . No focal neurological deficits. Extremities: No C/C/E, +pedal pulses Skin: No rashes, lesions or ulcers Psychiatry:  and able to fully assess given current mental state     Data Reviewed: I have personally reviewed following labs and imaging studies  CBC:  Recent Labs Lab 07/06/15 1500 07/07/15 0500  WBC 7.2 4.2  HGB 12.1* 11.6*  HCT 37.3* 35.1*  MCV 91.4 90.9  PLT 168 062*   Basic Metabolic Panel:  Recent Labs Lab 07/06/15 1500 07/07/15 0307  NA 140 138  K 3.9 3.5  CL 110 108  CO2 28 26  GLUCOSE 73 109*  BUN 16 12  CREATININE 1.10 0.91  CALCIUM 8.6* 8.6*   GFR: Estimated Creatinine Clearance: 60.1 mL/min (by C-G formula based on Cr of 0.91). Liver Function Tests:  Recent Labs Lab 07/06/15 1500  AST 18  ALT 12*  ALKPHOS 49  BILITOT 0.8  PROT 6.1*  ALBUMIN 3.3*   No results for input(s): LIPASE, AMYLASE in the last 168 hours.  Recent Labs Lab 07/06/15 2030  AMMONIA <9*   Coagulation Profile: No results for input(s): INR, PROTIME in the last 168 hours. Cardiac Enzymes:  Recent Labs Lab 07/06/15 1520 07/06/15 2030 07/07/15 0307 07/07/15 0833  TROPONINI <0.03 <0.03 <0.03 <0.03   BNP (last 3 results) No results for input(s): PROBNP in the last 8760 hours. HbA1C:  Recent Labs  07/07/15 0307  HGBA1C 6.0*   CBG:  Recent Labs Lab 07/07/15 1630 07/07/15 2251 07/08/15 0402 07/08/15 0746 07/08/15 1154   GLUCAP 131* 84 81 83 102*   Lipid Profile: No results for input(s): CHOL, HDL, LDLCALC, TRIG, CHOLHDL, LDLDIRECT in the last 72 hours. Thyroid Function Tests:  Recent Labs  07/06/15 1500  TSH 0.938   Anemia Panel:  Recent Labs  07/06/15 1502 07/07/15 0307  VITAMINB12 110*  --   FERRITIN  --  31  TIBC  --  244*  IRON  --  75   Urine analysis:    Component Value Date/Time   COLORURINE YELLOW 07/06/2015 [place] 07/06/2015 1705   LABSPEC 1.025 07/06/2015 1705   PHURINE 5.5 07/06/2015 1705   GLUCOSEU NEGATIVE 07/06/2015 1705   HGBUR SMALL* 07/06/2015 1705   BILIRUBINUR NEGATIVE 07/06/2015 1705   KETONESUR NEGATIVE 07/06/2015 1705   PROTEINUR NEGATIVE 07/06/2015 1705   UROBILINOGEN 0.2 11/13/2012 1238   NITRITE NEGATIVE 07/06/2015 1705   LEUKOCYTESUR NEGATIVE 07/06/2015 1705   Sepsis Labs: '@LABRCNTIP'$ (procalcitonin:4,lacticidven:4)  ) Recent Results (from the past 240 hour(s))  Urine culture     Status: Abnormal   Collection Time: 07/06/15  5:05 PM  Result Value Ref Range Status   Specimen Description URINE, CLEAN CATCH  Final   Special Requests NONE  Final   Culture MULTIPLE SPECIES PRESENT, SUGGEST RECOLLECTION (A)  Final   Report Status 07/08/2015 FINAL  Final  MRSA PCR Screening     Status: None   Collection Time: 07/06/15  8:50 PM  Result Value Ref Range Status   MRSA by PCR NEGATIVE NEGATIVE Final    Comment:        The GeneXpert MRSA Assay (FDA approved for NASAL specimens only), is one component of a comprehensive MRSA colonization surveillance program. It is not intended to diagnose MRSA infection nor to guide or monitor treatment for MRSA infections.          Radiology Studies: No results found.      Scheduled Meds: . aspirin EC  81 mg Oral Daily  . enoxaparin (LOVENOX) injection  40 mg Subcutaneous Q24H  . fluticasone  2 spray Each Nare Daily  . insulin aspart  0-9 Units Subcutaneous TID WC  . omega-3 acid  ethyl esters  1 g Oral Daily  . oxybutynin  5 mg Oral BID  . pravastatin  80 mg Oral q1800  . sodium chloride flush  3 mL Intravenous Q12H   Continuous Infusions:       Time spen25 minutes. Greater than 50% of this time was spent in direct contact with the patient coordinating care.     Lelon Frohlich, MD [facility] Pager [phone]  If 7PM-7AM, please contact night-coverage [email or web address] Password [email or web address] 07/08/2015, 3:34 PM

## 2015-07-08 NOTE — Progress Notes (Signed)
Pt's restraints removed around 1100 today.  Pt is calm at present.
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
## 2015-07-09 DIAGNOSIS — R4182 Altered mental status, unspecified: Secondary | ICD-10-CM | POA: Diagnosis not present

## 2015-07-09 DIAGNOSIS — G9340 Encephalopathy, unspecified: Secondary | ICD-10-CM | POA: Diagnosis not present

## 2015-07-09 LAB — GLUCOSE, CAPILLARY
Glucose-Capillary: 95 mg/dL (ref 65–99)
Glucose-Capillary: 104 mg/dL — ABNORMAL HIGH (ref 65–99)
Glucose-Capillary: 107 mg/dL — ABNORMAL HIGH (ref 65–99)
Glucose-Capillary: 115 mg/dL — ABNORMAL HIGH (ref 65–99)

## 2015-07-09 MED ORDER — ALPRAZOLAM 1 MG PO TABS: 1.0000 mg | ORAL_TABLET | Freq: Every evening | ORAL | Status: DC | PRN

## 2015-07-09 MED ORDER — CITALOPRAM HYDROBROMIDE 20 MG PO TABS
10.0000 mg | ORAL_TABLET | Freq: Every day | ORAL | Status: DC
  Administered 2015-07-09 – 2015-07-10 (×2): 10 mg via ORAL
  Filled 2015-07-09 (×2): qty 1

## 2015-07-09 MED ORDER — HYDROXYZINE HCL 50 MG/ML IM SOLN
50.0000 mg | INTRAMUSCULAR | Status: DC | PRN
  Administered 2015-07-09: 50 mg via INTRAMUSCULAR
  Filled 2015-07-09: qty 1

## 2015-07-09 NOTE — Progress Notes (Signed)
CM communicated with patient but not stable to explain the ABN or sign the notice.  There is no family at bedside and CM not able to reach family by phone.  Communicated information to staff.

## 2015-07-09 NOTE — Progress Notes (Signed)
Thomas NOTE    QUE [name]  DGU:[id number] DOB: [birth year] DOA: 07/06/2015 PCP: Thomas Herring., MD     Brief Narrative:  82 year old man admitted on 6/1 after found wandering on the streets and confused. Remains confused, although B formally diagnosed with dementia and suspect that he does have dementia with delirium and sundowning.   Assessment & Plan:   Principal Problem:   Altered mental status Active Problems:   Insulin dependent diabetes mellitus (HCC)   CAD, ARTERY BYPASS GRAFT   COPD (chronic obstructive pulmonary disease) (HCC)   Acute encephalopathy   Normocytic anemia   Hypotension   Anxiety   Acute encephalopathy/delirium -Etiology is uncertain, although likely represents dementia with delirium. -CT scan of the head was negative, no focal deficits to suggest CVA. -Chest x-ray and UA negative for source of infection. -Wonder if polypharmacy and difficulty taking medications may be playing a role. -Tox screen is only positive for benzodiazepines which he is prescribed.  -Was hypotensive on admission this has resolved with the fluid bolus. -After further history obtained from wife, I strongly believe that Thomas [name] may have dementia and may be having some delirium/sundowning while in the hospital.  -Appreciate psychiatry assistance with medications. Recommended initiation of Celexa 20 mg daily as well as vistaril IM 50 mg as needed for agitation. They have also recommended patient be transferred to a memory care unit for safety purposes (I agree with this recommendation).  Diabetes mellitus -Well-controlled at present.    DVT prophylaxis: Lovenox Code Status: Full Code Family Communication: Wife Thomas [name] via phone on 6/3. Disposition Plan: to be determined pending clinical improvement, would strongly benefit from SNF/ALF/memory care unit as I believe he is at danger at home given his propensity to wandering and getting lost.  Consultants:    None  Procedures:   None  Antimicrobials:   None    Subjective:   Objective: Filed Vitals:   07/09/15 0500 07/09/15 0600 07/09/15 0800 07/09/15 0830  BP:  123/65 122/77   Pulse:      Temp:    97.7 F (36.5 C)  TempSrc:    Oral  Resp: '11 13 15   '$ Height:      Weight: 72.5 kg (159 lb 13.3 oz)     SpO2:        Intake/Output Summary (Last 24 hours) at 07/09/15 1034 Last data filed at 07/09/15 0930  Gross per 24 hour  Intake   1080 ml  Output   1475 ml  Net   -395 ml   Filed Weights   07/06/15 2100 07/08/15 0534 07/09/15 0500  Weight: 73.8 kg (162 lb 11.2 oz) 71.6 kg (157 lb 13.6 oz) 72.5 kg (159 lb 13.3 oz)    Examination:  General examSeems confused Respiratory system: Clear to auscultation. Respiratory effort normal. Cardiovascular system:RRR. No murmurs, rubs, gallops. Gastrointestinal system: Abdomen is nondistended, soft and nontender. No organomegaly or masses felt. Normal bowel sounds heard. Central nervous system: . No focal neurological deficits. Extremities: No C/C/E, +pedal pulses Skin: No rashes, lesions or ulcers Psychiatry:  unable to fully assess given current mental state     Data Reviewed: I have personally reviewed following labs and imaging studies  CBC:  Recent Labs Lab 07/06/15 1500 07/07/15 0500  WBC 7.2 4.2  HGB 12.1* 11.6*  HCT 37.3* 35.1*  MCV 91.4 90.9  PLT 168 956*   Basic Metabolic Panel:  Recent Labs Lab 07/06/15 1500 07/07/15 0307  NA 140 138  K  3.9 3.5  CL 110 108  CO2 28 26  GLUCOSE 73 109*  BUN 16 12  CREATININE 1.10 0.91  CALCIUM 8.6* 8.6*   GFR: Estimated Creatinine Clearance: 60.9 mL/min (by C-G formula based on Cr of 0.91). Liver Function Tests:  Recent Labs Lab 07/06/15 1500  AST 18  ALT 12*  ALKPHOS 49  BILITOT 0.8  PROT 6.1*  ALBUMIN 3.3*   No results for input(s): LIPASE, AMYLASE in the last 168 hours.  Recent Labs Lab 07/06/15 2030  AMMONIA <9*   Coagulation Profile: No  results for input(s): INR, PROTIME in the last 168 hours. Cardiac Enzymes:  Recent Labs Lab 07/06/15 1520 07/06/15 2030 07/07/15 0307 07/07/15 0833  TROPONINI <0.03 <0.03 <0.03 <0.03   BNP (last 3 results) No results for input(s): PROBNP in the last 8760 hours. HbA1C:  Recent Labs  07/07/15 0307  HGBA1C 6.0*   CBG:  Recent Labs Lab 07/08/15 0746 07/08/15 1154 07/08/15 1607 07/08/15 2101 07/09/15 0812  GLUCAP 83 102* 77 90 95   Lipid Profile: No results for input(s): CHOL, HDL, LDLCALC, TRIG, CHOLHDL, LDLDIRECT in the last 72 hours. Thyroid Function Tests:  Recent Labs  07/06/15 1500  TSH 0.938   Anemia Panel:  Recent Labs  07/06/15 1502 07/07/15 0307  VITAMINB12 110*  --   FERRITIN  --  31  TIBC  --  244*  IRON  --  75   Urine analysis:    Component Value Date/Time   COLORURINE YELLOW 07/06/2015 [place] 07/06/2015 1705   LABSPEC 1.025 07/06/2015 1705   PHURINE 5.5 07/06/2015 1705   GLUCOSEU NEGATIVE 07/06/2015 1705   HGBUR SMALL* 07/06/2015 1705   BILIRUBINUR NEGATIVE 07/06/2015 1705   KETONESUR NEGATIVE 07/06/2015 1705   PROTEINUR NEGATIVE 07/06/2015 1705   UROBILINOGEN 0.2 11/13/2012 1238   NITRITE NEGATIVE 07/06/2015 1705   LEUKOCYTESUR NEGATIVE 07/06/2015 1705   Sepsis Labs: '@LABRCNTIP'$ (procalcitonin:4,lacticidven:4)  ) Recent Results (from the past 240 hour(s))  Urine culture     Status: Abnormal   Collection Time: 07/06/15  5:05 PM  Result Value Ref Range Status   Specimen Description URINE, CLEAN CATCH  Final   Special Requests NONE  Final   Culture MULTIPLE SPECIES PRESENT, SUGGEST RECOLLECTION (A)  Final   Report Status 07/08/2015 FINAL  Final  MRSA PCR Screening     Status: None   Collection Time: 07/06/15  8:50 PM  Result Value Ref Range Status   MRSA by PCR NEGATIVE NEGATIVE Final    Comment:        The GeneXpert MRSA Assay (FDA approved for NASAL specimens only), is one component of a comprehensive  MRSA colonization surveillance program. It is not intended to diagnose MRSA infection nor to guide or monitor treatment for MRSA infections.          Radiology Studies: No results found.      Scheduled Meds: . aspirin EC  81 mg Oral Daily  . citalopram  10 mg Oral Daily  . enoxaparin (LOVENOX) injection  40 mg Subcutaneous Q24H  . fluticasone  2 spray Each Nare Daily  . insulin aspart  0-9 Units Subcutaneous TID WC  . omega-3 acid ethyl esters  1 g Oral Daily  . oxybutynin  5 mg Oral BID  . pravastatin  80 mg Oral q1800  . sodium chloride flush  3 mL Intravenous Q12H   Continuous Infusions:       Time spen25 minutes. Greater than 50% of this  time was spent in direct contact with the patient coordinating care.     Lelon Frohlich, MD [facility] Pager [phone]  If 7PM-7AM, please contact night-coverage [email or web address] Password [email or web address] 07/09/2015, 10:34 AM

## 2015-07-09 NOTE — Consult Note (Signed)
Telepsych Consultation   Reason for Consult: Confusion  Referring Physician: Dr. Jerilee Hoh  Patient Identification: Thomas [name] MRN:  [id number] Principal Diagnosis: Altered mental status Diagnosis:   Patient Active Problem List   Diagnosis Date Noted  . Altered mental status [R41.82] 07/06/2015  . Normocytic anemia [D64.9] 07/06/2015  . Hypotension [I95.9] 07/06/2015  . Anxiety [F41.9] 07/06/2015  . Arterial hypotension [I95.9]   . Abnormal laboratory test result [R89.9] 11/01/2012  . Epigastric pain [R10.13] 09/30/2012  . NSTEMI (non-ST elevated myocardial infarction) ([place]) [I21.4] 09/30/2012  . Acute renal failure ([place]) [N17.9] 09/09/2012  . Acute encephalopathy [G93.40] 09/07/2012  . Malignant neoplasm of upper lobe, bronchus or lung [C34.10] 02/19/2011  . Right bundle branch block and left anterior fascicular block [I45.2] 03/31/2009  . CHEST PAIN-UNSPECIFIED [R07.9] 03/31/2009  . Insulin dependent diabetes mellitus ([place]) [E11.9, Z79.4] 07/18/2008  . HYPERLIPIDEMIA-MIXED [E78.5] 07/18/2008  . CAD, ARTERY BYPASS GRAFT [I25.810] 07/18/2008  . COPD (chronic obstructive pulmonary disease) ([place]) [J44.9] 07/18/2008    Total Time spent with patient: 30 minutes  Subjective:   Thomas [name] is a 82 y.o. male patient who was admitted after being found wandering in the streets and was brought to the [facility] by the fire department.   HPI:    Thomas [name] is an 82 year old male who was brought to the [facility] by the fire department after being found wandering in the streets. Patient has been a poor historian since his admission. He initially told staff that he was going to get a paper for his wife in [place]. Today during assessment the patient tells writer that he was going fishing but needed a place to lay down. The patient continues to be confused with no insight into his present condition. He has required restraints for agitation in the evening and per notes was just released this  morning. Patient is insistent that "there is nothing wrong with my mind. I just need to go home. My wife wants me to come home." Patient is also a poor historian about his medications as he denies having anxiety but also has no knowledge of taking xanax. According to past notes patient was suspected to have dementia as far back as 2014 at which time a dementia unit was recommended. Discussed case with Dr. Jerilee Hoh who reports that patient has never had an official work up for dementia. She spoke with his wife who reported that patient has had numerous episodes of confused behaviors. It appears that patient would be a risk to himself at this time if he were to be discharged home with wife. Dr. Jerilee Hoh reports that social work will be involved to help with placement.   Past Psychiatric History: Depression/anxiety   Risk to Self: Suicidal Ideation: No Suicidal Intent: No Is patient at risk for suicide?: No Suicidal Plan?: No Access to Means: No What has been your use of drugs/alcohol within the last 12 months?: none How many times?: 0 Other Self Harm Risks: none Triggers for Past Attempts: None known Intentional Self Injurious Behavior: None Risk to Others: Homicidal Ideation: No Thoughts of Harm to Others: No Current Homicidal Intent: No Current Homicidal Plan: No Access to Homicidal Means: No Identified Victim: none History of harm to others?: No Assessment of Violence: None Noted Violent Behavior Description: to the medical staff Does patient have access to weapons?: No Criminal Charges Pending?: No Does patient have a court date: No Prior Inpatient Therapy: Prior Inpatient Therapy: No Prior Outpatient Therapy: Prior Outpatient Therapy: No  Does patient have an ACCT team?: No Does patient have Intensive In-House Services?  : No Does patient have [facility] services? : No Does patient have P4CC services?: No  Past Medical History:  Past Medical History  Diagnosis Date  . Diabetes  mellitus   . Anginal pain ([place])   . Hyperlipemia   . Cancer ([place])   . Lung cancer ([place])   . Prostate cancer [facility])     Past Surgical History  Procedure Laterality Date  . Back surgery    . Tumor removal      from lung  . Prostate surgery    . Cardiac surgery    . Surgery to repair aneurysm     Family History: History reviewed. No pertinent family history. Family Psychiatric  History: Patient denies Social History:  History  Alcohol Use No     History  Drug Use No    Social History   Social History  . Marital Status: Married    Spouse Name: N/A  . Number of Children: N/A  . Years of Education: N/A   Social History Main Topics  . Smoking status: Former Smoker -- 1.00 packs/day for 50 years    Types: Cigarettes    Quit date: 12/10/1993  . Smokeless tobacco: Current User    Types: Chew  . Alcohol Use: No  . Drug Use: No  . Sexual Activity: No   Other Topics Concern  . None   Social History Narrative   Additional Social History:    Allergies:  No Known Allergies  Labs:  Results for orders placed or performed during the hospital encounter of 07/06/15 (from the past 48 hour(s))  Glucose, capillary     Status: Abnormal   Collection Time: 07/07/15 11:45 AM  Result Value Ref Range   Glucose-Capillary 115 (H) 65 - 99 mg/dL   Comment 1 Notify RN   Glucose, capillary     Status: Abnormal   Collection Time: 07/07/15  4:30 PM  Result Value Ref Range   Glucose-Capillary 131 (H) 65 - 99 mg/dL   Comment 1 Notify RN    Comment 2 Document in Chart   Glucose, capillary     Status: None   Collection Time: 07/07/15 10:51 PM  Result Value Ref Range   Glucose-Capillary 84 65 - 99 mg/dL  Procalcitonin     Status: None   Collection Time: 07/08/15  3:53 AM  Result Value Ref Range   Procalcitonin <0.10 ng/mL    Comment:        Interpretation: PCT (Procalcitonin) <= 0.5 ng/mL: Systemic infection (sepsis) is not likely. Local bacterial infection is possible. (NOTE)          ICU PCT Algorithm               Non ICU PCT Algorithm    ----------------------------     ------------------------------         PCT < 0.25 ng/mL                 PCT < 0.1 ng/mL     Stopping of antibiotics            Stopping of antibiotics       strongly encouraged.               strongly encouraged.    ----------------------------     ------------------------------       PCT level decrease by  PCT < 0.25 ng/mL       >= 80% from peak PCT       OR PCT 0.25 - 0.5 ng/mL          Stopping of antibiotics                                             encouraged.     Stopping of antibiotics           encouraged.    ----------------------------     ------------------------------       PCT level decrease by              PCT >= 0.25 ng/mL       < 80% from peak PCT        AND PCT >= 0.5 ng/mL            Continuin g antibiotics                                              encouraged.       Continuing antibiotics            encouraged.    ----------------------------     ------------------------------     PCT level increase compared          PCT > 0.5 ng/mL         with peak PCT AND          PCT >= 0.5 ng/mL             Escalation of antibiotics                                          strongly encouraged.      Escalation of antibiotics        strongly encouraged.   Glucose, capillary     Status: None   Collection Time: 07/08/15  4:02 AM  Result Value Ref Range   Glucose-Capillary 81 65 - 99 mg/dL   Comment 1 Notify RN    Comment 2 Document in Chart   Glucose, capillary     Status: None   Collection Time: 07/08/15  7:46 AM  Result Value Ref Range   Glucose-Capillary 83 65 - 99 mg/dL   Comment 1 Notify RN    Comment 2 Document in Chart   Glucose, capillary     Status: Abnormal   Collection Time: 07/08/15 11:54 AM  Result Value Ref Range   Glucose-Capillary 102 (H) 65 - 99 mg/dL   Comment 1 Notify RN    Comment 2 Document in Chart   Glucose, capillary     Status: None    Collection Time: 07/08/15  4:07 PM  Result Value Ref Range   Glucose-Capillary 77 65 - 99 mg/dL  Glucose, capillary     Status: None   Collection Time: 07/08/15  9:01 PM  Result Value Ref Range   Glucose-Capillary 90 65 - 99 mg/dL  Glucose, capillary     Status: None   Collection Time: 07/09/15  8:12 AM  Result Value Ref Range   Glucose-Capillary 95 65 - 99 mg/dL  Comment 1 Notify RN    Comment 2 Document in Chart   Glucose, capillary     Status: Abnormal   Collection Time: 07/09/15 11:06 AM  Result Value Ref Range   Glucose-Capillary 104 (H) 65 - 99 mg/dL    Current Facility-Administered Medications  Medication Dose Route Frequency Provider Last Rate Last Dose  . acetaminophen (TYLENOL) tablet 650 mg  650 mg Oral Q6H PRN Vianne Bulls, MD   650 mg at 07/08/15 2009   Or  . acetaminophen (TYLENOL) suppository 650 mg  650 mg Rectal Q6H PRN Vianne Bulls, MD      . ALPRAZolam Duanne Moron) tablet 1 mg  1 mg Oral QHS PRN Erline Hau, MD      . aspirin EC tablet 81 mg  81 mg Oral Daily Vianne Bulls, MD   81 mg at 07/09/15 0903  . citalopram (CELEXA) tablet 10 mg  10 mg Oral Daily Erline Hau, MD   10 mg at 07/09/15 1114  . enoxaparin (LOVENOX) injection 40 mg  40 mg Subcutaneous Q24H Vianne Bulls, MD   40 mg at 07/09/15 0228  . fluticasone (FLONASE) 50 MCG/ACT nasal spray 2 spray  2 spray Each Nare Daily Vianne Bulls, MD   2 spray at 07/09/15 0900  . HYDROcodone-acetaminophen (NORCO) 10-325 MG per tablet 1 tablet  1 tablet Oral Q4H PRN Vianne Bulls, MD      . hydrOXYzine (VISTARIL) injection 50 mg  50 mg Intramuscular Q4H PRN Erline Hau, MD   50 mg at 07/09/15 1115  . insulin aspart (novoLOG) injection 0-9 Units  0-9 Units Subcutaneous TID WC Vianne Bulls, MD   0 Units at 07/07/15 [phone]  . omega-3 acid ethyl esters (LOVAZA) capsule 1 g  1 g Oral Daily Vianne Bulls, MD   1 g at 07/09/15 0903  . ondansetron (ZOFRAN) tablet 4 mg  4 mg  Oral Q6H PRN Vianne Bulls, MD       Or  . ondansetron (ZOFRAN) injection 4 mg  4 mg Intravenous Q6H PRN Vianne Bulls, MD      . oxybutynin (DITROPAN) tablet 5 mg  5 mg Oral BID Vianne Bulls, MD   5 mg at 07/09/15 0903  . pravastatin (PRAVACHOL) tablet 80 mg  80 mg Oral q1800 Vianne Bulls, MD   80 mg at 07/08/15 1707  . sodium chloride flush (NS) 0.9 % injection 3 mL  3 mL Intravenous Q12H Ilene Qua Opyd, MD   3 mL at 07/09/15 0900   Facility-Administered Medications Ordered in Other Encounters  Medication Dose Route Frequency Provider Last Rate Last Dose  . albuterol (PROVENTIL) (5 MG/ML) 0.5% nebulizer solution 2.5 mg  2.5 mg Nebulization Once Lendon Colonel, NP        Musculoskeletal:  Unable to assess via camera   Psychiatric Specialty Exam: Physical Exam  Review of Systems  Psychiatric/Behavioral: Negative for depression, suicidal ideas, hallucinations, memory loss and substance abuse. The patient is nervous/anxious. The patient does not have insomnia.     Blood pressure 122/77, pulse 51, temperature 97.7 F (36.5 C), temperature source Oral, resp. rate 15, height '5\' 11"'$  (1.803 m), weight 72.5 kg (159 lb 13.3 oz), SpO2 93 %.Body mass index is 22.3 kg/(m^2).  General Appearance: Casual  Eye Contact:  Good  Speech:  Clear and Coherent  Volume:  Normal  Mood:  Anxious  Affect:  Congruent  Thought  Process:  Irrelevant  Orientation:  Other:  Patient oriented to place and self, reported month was February with the year being 3004.   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  Start Celexa 10 mg daily for anxiety/agitated behaviors. Start Vistaril 50 mg IM every four hours as needed for anxiety or agitation.   Disposition: Social work to assist in transferring the patient to a memory care unit for safety purposes due to history of wandering behaviors with persistent confusion.   Elmarie Shiley, NP 07/09/2015 11:31 AM    I reviewed chart and agreed with the findings and treatment Plan.  Berniece Andreas, MD

## 2015-07-09 NOTE — Progress Notes (Signed)
Pt left the floor with nursing staff.  Pt in no distress.  Belongings, paper chart, and medications sent with patient.  Report called to Dept. 300 RN.   Vitals signs as follows:  Temp: 96.7 F (35.9 C) (06/04 1606) Temp Source: Oral (06/04 1606) BP: 122/77 mmHg (06/04 0800) Respirations: 15

## 2015-07-10 DIAGNOSIS — G9340 Encephalopathy, unspecified: Secondary | ICD-10-CM | POA: Diagnosis not present

## 2015-07-10 LAB — GLUCOSE, CAPILLARY
GLUCOSE-CAPILLARY: 124 mg/dL — AB (ref 65–99)
GLUCOSE-CAPILLARY: 124 mg/dL — AB (ref 65–99)
GLUCOSE-CAPILLARY: 124 mg/dL — AB (ref 65–99)

## 2015-07-10 LAB — FOLATE RBC
FOLATE, RBC: 809 ng/mL (ref >498)
Folate, Hemolysate: 295.2 ng/mL
Hematocrit: 36.5 % — ABNORMAL LOW (ref 37.5–51.0)

## 2015-07-10 LAB — PROCALCITONIN

## 2015-07-10 MED ORDER — CITALOPRAM HYDROBROMIDE 10 MG PO TABS: 10.0000 mg | ORAL_TABLET | Freq: Every day | ORAL | Status: DC

## 2015-07-10 NOTE — Progress Notes (Signed)
Reviewed discharge instructions with pt family.  Answered all questions at this time.  Pt had no IV access to remove.  Discharged home with family.

## 2015-07-10 NOTE — Care Management Note (Addendum)
Case Management Note  Patient Details  Name: ESIAS MORY MRN: [id number] Date of Birth: 19-Apr-1927   Expected Discharge Date:        07/10/2015          Expected Discharge Plan:  [place]  In-House Referral:  NA  Discharge planning Services  CM Consult  Post Acute Care Choice:  Home Health Choice offered to:  Patient  DME Arranged:    DME Agency:     HH Arranged:  RN [place] Agency:  [facility]  Status of Service:  Completed, signed off  Medicare Important Message Given:    Date Medicare IM Given:    Medicare IM give by:    Date Additional Medicare IM Given:    Additional Medicare Important Message give by:     If discussed at [place] of Stay Meetings, dates discussed:    Additional Comments: Patient discharging home today, Romualdo Bolk with [facility] made aware. Home Health orders setup last week. Romualdo Bolk will obtain information from chart. Patient aware [facility] has 48 hours to make first visit.  Lyric Hoar, Chauncey Reading, RN 07/10/2015, 1:34 PM

## 2015-07-10 NOTE — Clinical Social Work Note (Signed)
Due to additional concerns reported by MD, CSW made APS report to Betsey Amen at [facility]. Melissa indicates they will follow up with pt at home.  Benay Pike, [place]

## 2015-07-10 NOTE — Discharge Summary (Signed)
Physician Discharge Summary  RODDERICK HOLTZER TGG:[id number] DOB: January 14, 1928 DOA: 07/06/2015  PCP: Glo Herring., MD  Admit date: 07/06/2015 Discharge date: 07/10/2015  Time spent: 45 minutes  Recommendations for Outpatient Follow-up:  -Will be discharged home today with great reluctance on my hand. Although he is medically ready for discharge I believe he is not safe for discharge home giving his dementia with frequent delirium and frequent wanderings. Nonetheless his wife wishes him to go back home. Home health services will be arranged.   Discharge Diagnoses:  Principal Problem:   Altered mental status Active Problems:   Insulin dependent diabetes mellitus (HCC)   CAD, ARTERY BYPASS GRAFT   COPD (chronic obstructive pulmonary disease) (HCC)   Acute encephalopathy   Normocytic anemia   Hypotension   Anxiety   Discharge Condition: Stable and improved  Filed Weights   07/06/15 2100 07/08/15 0534 07/09/15 0500  Weight: 73.8 kg (162 lb 11.2 oz) 71.6 kg (157 lb 13.6 oz) 72.5 kg (159 lb 13.3 oz)    History of present illness:  As per Dr. Myna Hidalgo on 6/1: Thomas [name] is a 82 y.o. male with medical history significant for CAD status post CABG, lung cancer status post resection, insulin-dependent diabetes mellitus, and anxiety who is brought into the emergency department after being found with altered mental status. Patient is unable to contribute much to the history given his confusional state. He had reportedly been in his usual state and caring for his bedridden wife, but under unclear circumstances, was found in the road in [place] with confusion. He was reportedly encountered by firemen while wandering in the road and was unable to state his name or where he was going. A CBG was checked on the scene and reportedly in the mid 75s. He was given a soda with repeat CBG 95. He was reportedly hypotensive on the scene as well and 1 L of normal saline was given as a bolus prior to his  arrival. After the fluid bolus, his systolic blood pressure increased to 98 per report and the patient's mental status began to improve a little bit.   ED Course: Upon arrival to the ED, patient is found to be afebrile, saturating well on room air, with heart rate ranging from the mid 40s to 60s and blood pressure in the 80/50 range. EKG features a sinus rhythm with right bundle branch block and left anterior fascicular block. Troponin is undetectable. Chest x-ray is negative for acute cardiopulmonary disease and head CT is negative for acute intracranial abnormality. Chemistry panel is largely unremarkable and CBC is notable for hemoglobin of 12.1 with normal MCV. UDS is positive for benzodiazepines only, which are prescribed to him. Ethanol, acetaminophen, and salicylate toxic screens are negative and urinalysis is grossly negative for signs of infection. Patient was bolused with 500 mL 3 in the emergency department with recovery and his blood pressure to the low 703 systolic. He remained afebrile in the ED and systolic blood pressure remained in the low 100s following the fluid bolus. He'll be admitted to the stepdown unit for ongoing evaluation and management of acute encephalopathy with hypotension.  Hospital Course:   Acute encephalopathy/delirium -Etiology is uncertain, although likely represents dementia with delirium. -CT scan of the head was negative, no focal deficits to suggest CVA. -Chest x-ray and UA negative for source of infection. -Wonder if polypharmacy and difficulty taking medications may be playing a role. In any case I have recommended that he stop use of Ativan. -  Tox screen is only positive for benzodiazepines which he is prescribed.  -Was hypotensive on admission this has resolved with the fluid bolus. -After further history obtained from wife, I strongly believe that Mr. Manasco may have dementia and may be having some delirium/sundowning while in the hospital.  -Appreciate  psychiatry assistance with medications. Recommended initiation of Celexa 20 mg daily as well as vistaril IM 50 mg as needed for agitation. They have also recommended patient be transferred to a memory care unit for safety purposes (I agree with this recommendation). However, although I do not believe patient has decision-making capacity, his wife requests that he be discharged home and adamantly refuses placement.  Diabetes mellitus -Well-controlled at present.   Procedures:  None   Consultations:  Psychiatry  Discharge Instructions  Discharge Instructions    Diet - low sodium heart healthy    Complete by:  As directed      Increase activity slowly    Complete by:  As directed             Medication List    STOP taking these medications        ALPRAZolam 1 MG tablet  Commonly known as:  XANAX     HYDROcodone-acetaminophen 10-325 MG tablet  Commonly known as:  NORCO      TAKE these medications        aspirin 81 MG EC tablet  Take 1 tablet (81 mg total) by mouth daily.     citalopram 10 MG tablet  Commonly known as:  CELEXA  Take 1 tablet (10 mg total) by mouth daily.     diltiazem 240 MG 24 hr capsule  Commonly known as:  CARDIZEM CD  TAKE ONE CAPSULE ORALLY EVERY MORNING.     fish oil-omega-3 fatty acids 1000 MG capsule  Take 1 g by mouth daily.     fluticasone 50 MCG/ACT nasal spray  Commonly known as:  FLONASE  Place 2 sprays into both nostrils daily.     insulin glargine 100 UNIT/ML injection  Commonly known as:  LANTUS  Inject 0-30 Units into the skin 2 (two) times daily. Injects according to home sliding scale     isosorbide mononitrate 30 MG 24 hr tablet  Commonly known as:  IMDUR  TAKE ONE TABLET BY MOUTH ONCE DAILY.     ONE TOUCH ULTRA TEST test strip  Generic drug:  glucose blood     oxybutynin 5 MG tablet  Commonly known as:  DITROPAN  Take 5 mg by mouth 2 (two) times daily.     pravastatin 80 MG tablet  Commonly known as:  PRAVACHOL    TAKE 1 TABLET BY MOUTH DAILY IN THE EVENING.       No Known Allergies     Follow-up Information    Follow up with Glo Herring., MD. Schedule an appointment as soon as possible for a visit in 2 weeks.   Specialty:  Internal Medicine   Contact information:   [address] [place] [address] [phone]        The results of significant diagnostics from this hospitalization (including imaging, microbiology, ancillary and laboratory) are listed below for reference.    Significant Diagnostic Studies: Dg Chest 2 View  07/06/2015  CLINICAL DATA:  History of lung and prostate cancer. Altered mental status. EXAM: CHEST  2 VIEW COMPARISON:  01/12/2015 FINDINGS: Postsurgical changes from CABG and probable right lobectomy are stable. Cardiomediastinal silhouette is normal. There is no evidence of focal  airspace consolidation, pleural effusion or pneumothorax. Osseous structures are without acute abnormality. Soft tissues are grossly normal. Healed right-sided rib fracture is noted. IMPRESSION: Stable postsurgical changes in the thorax. No evidence of acute consolidation. Electronically Signed   By: Fidela Salisbury M.D.   On: 07/06/2015 14:49   Ct Head Wo Contrast  07/06/2015  CLINICAL DATA:  Altered mental status. EXAM: CT HEAD WITHOUT CONTRAST TECHNIQUE: Contiguous axial images were obtained from the base of the skull through the vertex without intravenous contrast. COMPARISON:  CT scan of September 07, 2012. FINDINGS: Bony calvarium appears intact. Mild diffuse cortical atrophy is noted. Mild chronic ischemic white matter disease is noted. Stable calcified meningioma is noted in right frontal region. No mass effect or midline shift is noted. Ventricular size is within normal limits. There is no evidence of mass lesion, hemorrhage or acute infarction. IMPRESSION: Mild diffuse cortical atrophy. Mild chronic ischemic white matter disease. Stable calcified right frontal meningioma. No  acute intracranial abnormality seen. Electronically Signed   By: Marijo Conception, M.D.   On: 07/06/2015 14:59    Microbiology: Recent Results (from the past 240 hour(s))  Urine culture     Status: Abnormal   Collection Time: 07/06/15  5:05 PM  Result Value Ref Range Status   Specimen Description URINE, CLEAN CATCH  Final   Special Requests NONE  Final   Culture MULTIPLE SPECIES PRESENT, SUGGEST RECOLLECTION (A)  Final   Report Status 07/08/2015 FINAL  Final  MRSA PCR Screening     Status: None   Collection Time: 07/06/15  8:50 PM  Result Value Ref Range Status   MRSA by PCR NEGATIVE NEGATIVE Final    Comment:        The GeneXpert MRSA Assay (FDA approved for NASAL specimens only), is one component of a comprehensive MRSA colonization surveillance program. It is not intended to diagnose MRSA infection nor to guide or monitor treatment for MRSA infections.      Labs: Basic Metabolic Panel:  Recent Labs Lab 07/06/15 1500 07/07/15 0307  NA 140 138  K 3.9 3.5  CL 110 108  CO2 28 26  GLUCOSE 73 109*  BUN 16 12  CREATININE 1.10 0.91  CALCIUM 8.6* 8.6*   Liver Function Tests:  Recent Labs Lab 07/06/15 1500  AST 18  ALT 12*  ALKPHOS 49  BILITOT 0.8  PROT 6.1*  ALBUMIN 3.3*   No results for input(s): LIPASE, AMYLASE in the last 168 hours.  Recent Labs Lab 07/06/15 2030  AMMONIA <9*   CBC:  Recent Labs Lab 07/06/15 1500 07/07/15 0500  WBC 7.2 4.2  HGB 12.1* 11.6*  HCT 37.3* 35.1*  MCV 91.4 90.9  PLT 168 140*   Cardiac Enzymes:  Recent Labs Lab 07/06/15 1520 07/06/15 2030 07/07/15 0307 07/07/15 0833  TROPONINI <0.03 <0.03 <0.03 <0.03   BNP: BNP (last 3 results) No results for input(s): BNP in the last 8760 hours.  ProBNP (last 3 results) No results for input(s): PROBNP in the last 8760 hours.  CBG:  Recent Labs Lab 07/09/15 1106 07/09/15 1607 07/09/15 2224 07/10/15 0730 07/10/15 1121  GLUCAP 104* 107* 115* 124* 124*        Signed:  HERNANDEZ ACOSTA,ESTELA  [facility] Pager: [phone] 07/10/2015, 3:30 PM

## 2015-07-10 NOTE — Clinical Social Work Note (Addendum)
Clinical Social Work Assessment  Patient Details  Name: Thomas [name] MRN: [id number] Date of Birth: 1927/06/12  Date of referral:  07/10/15               Reason for consult:  Discharge Planning                Permission sought to share information with:    Permission granted to share information::     Name::        Agency::     Relationship::     Contact Information:     Housing/Transportation Living arrangements for the past 2 months:  Single Family Home Source of Information:  Spouse Patient Interpreter Needed:  None Criminal Activity/Legal Involvement Pertinent to Current Situation/Hospitalization:  No - Comment as needed Significant Relationships:  Spouse Lives with:  Spouse Do you feel safe going back to the place where you live?    Need for family participation in patient care:  Yes (Comment)  Care giving concerns:  Pt's wife feels that eventually she will need to consider placement for pt.    Social Worker assessment / plan:  CSW spoke with pt's wife on phone as pt oriented to self and place only per Therapist, sports. H&P indicates pt was found on side of road and did not know his name or where he was going. Admitted due to acute encephalopathy. Per notes, suspect dementia. Pt's wife indicates that they manage fairly well at home. They have been married 38 years and have no children together. Pt has 5 children, but none are involved. She shared that she does not go anywhere, so is at home with pt. Pt's wife said that he can get agitated, but she just tries to do things to calm him down at home. Recommendation was made for placement by psychiatry. CSW discussed this with pt's wife and she was not interested at this time. She feels that the day may come where it is necessary, but thinks they can continue to manage at home where he is happiest. CSW suggested that she contact DSS regarding possible Medicaid in case placement is needed from home. She has a neighbor who is a Actuary and she is  considering paying her to assist pt at home. ALF list left in room. Discussed home health social worker with wife and she was agreeable in addition to RN. CM notified.   Employment status:  Retired Nurse, adult PT Recommendations:  Not assessed at this time Information / Referral to community resources:  Other (Comment Required) (ALF list)  Patient/Family's Response to care:  Pt's wife reports that pt has been to SNF before and hated it. He cried everyday to come home. CSW explained differences in SNF vs ALF, but she feels certain that he will feel the same way and wants to take him home until she can no longer manage.   Patient/Family's Understanding of and Emotional Response to Diagnosis, Current Treatment, and Prognosis: Pt's wife is aware that pt is nearing d/c and plans for him to return home.   Emotional Assessment Appearance:  Appears stated age Attitude/Demeanor/Rapport:  Unable to Assess Affect (typically observed):  Unable to Assess Orientation:  Oriented to Self, Oriented to Place Alcohol / Substance use:  Not Applicable Psych involvement (Current and /or in the community):  No (Comment)  Discharge Needs  Concerns to be addressed:  Discharge Planning Concerns Readmission within the last 30 days:  No Current discharge risk:  Cognitively Impaired Barriers to  Discharge:  No Barriers Identified   Salome Arnt, [place] 07/10/2015, 10:13 AM [phone]

## 2015-07-11 DIAGNOSIS — F419 Anxiety disorder, unspecified: Secondary | ICD-10-CM | POA: Diagnosis not present

## 2015-07-11 DIAGNOSIS — Z85118 Personal history of other malignant neoplasm of bronchus and lung: Secondary | ICD-10-CM | POA: Diagnosis not present

## 2015-07-11 DIAGNOSIS — J449 Chronic obstructive pulmonary disease, unspecified: Secondary | ICD-10-CM | POA: Diagnosis not present

## 2015-07-11 DIAGNOSIS — Z794 Long term (current) use of insulin: Secondary | ICD-10-CM | POA: Diagnosis not present

## 2015-07-11 DIAGNOSIS — Z951 Presence of aortocoronary bypass graft: Secondary | ICD-10-CM | POA: Diagnosis not present

## 2015-07-11 DIAGNOSIS — Z7982 Long term (current) use of aspirin: Secondary | ICD-10-CM | POA: Diagnosis not present

## 2015-07-11 DIAGNOSIS — Z8546 Personal history of malignant neoplasm of prostate: Secondary | ICD-10-CM | POA: Diagnosis not present

## 2015-07-11 DIAGNOSIS — R4182 Altered mental status, unspecified: Secondary | ICD-10-CM | POA: Diagnosis not present

## 2015-07-11 DIAGNOSIS — E119 Type 2 diabetes mellitus without complications: Secondary | ICD-10-CM | POA: Diagnosis not present

## 2015-07-11 DIAGNOSIS — I2510 Atherosclerotic heart disease of native coronary artery without angina pectoris: Secondary | ICD-10-CM | POA: Diagnosis not present

## 2015-07-13 DIAGNOSIS — I10 Essential (primary) hypertension: Secondary | ICD-10-CM | POA: Diagnosis not present

## 2015-07-13 DIAGNOSIS — Z1389 Encounter for screening for other disorder: Secondary | ICD-10-CM | POA: Diagnosis not present

## 2015-07-13 DIAGNOSIS — E119 Type 2 diabetes mellitus without complications: Secondary | ICD-10-CM | POA: Diagnosis not present

## 2015-07-13 DIAGNOSIS — Z6823 Body mass index (BMI) 23.0-23.9, adult: Secondary | ICD-10-CM | POA: Diagnosis not present

## 2015-07-14 DIAGNOSIS — R4182 Altered mental status, unspecified: Secondary | ICD-10-CM | POA: Diagnosis not present

## 2015-07-14 DIAGNOSIS — Z8546 Personal history of malignant neoplasm of prostate: Secondary | ICD-10-CM | POA: Diagnosis not present

## 2015-07-14 DIAGNOSIS — Z85118 Personal history of other malignant neoplasm of bronchus and lung: Secondary | ICD-10-CM | POA: Diagnosis not present

## 2015-07-14 DIAGNOSIS — Z794 Long term (current) use of insulin: Secondary | ICD-10-CM | POA: Diagnosis not present

## 2015-07-14 DIAGNOSIS — J449 Chronic obstructive pulmonary disease, unspecified: Secondary | ICD-10-CM | POA: Diagnosis not present

## 2015-07-14 DIAGNOSIS — E119 Type 2 diabetes mellitus without complications: Secondary | ICD-10-CM | POA: Diagnosis not present

## 2015-07-14 DIAGNOSIS — Z951 Presence of aortocoronary bypass graft: Secondary | ICD-10-CM | POA: Diagnosis not present

## 2015-07-14 DIAGNOSIS — F419 Anxiety disorder, unspecified: Secondary | ICD-10-CM | POA: Diagnosis not present

## 2015-07-14 DIAGNOSIS — I2510 Atherosclerotic heart disease of native coronary artery without angina pectoris: Secondary | ICD-10-CM | POA: Diagnosis not present

## 2015-07-14 DIAGNOSIS — Z7982 Long term (current) use of aspirin: Secondary | ICD-10-CM | POA: Diagnosis not present

## 2015-07-17 DIAGNOSIS — K5900 Constipation, unspecified: Secondary | ICD-10-CM | POA: Diagnosis not present

## 2015-07-17 DIAGNOSIS — I959 Hypotension, unspecified: Secondary | ICD-10-CM | POA: Diagnosis not present

## 2015-07-17 DIAGNOSIS — Z79899 Other long term (current) drug therapy: Secondary | ICD-10-CM | POA: Diagnosis not present

## 2015-07-17 DIAGNOSIS — F419 Anxiety disorder, unspecified: Secondary | ICD-10-CM | POA: Diagnosis not present

## 2015-07-17 DIAGNOSIS — M1990 Unspecified osteoarthritis, unspecified site: Secondary | ICD-10-CM | POA: Diagnosis not present

## 2015-07-17 DIAGNOSIS — F0390 Unspecified dementia without behavioral disturbance: Secondary | ICD-10-CM | POA: Diagnosis not present

## 2015-07-17 DIAGNOSIS — N3281 Overactive bladder: Secondary | ICD-10-CM | POA: Diagnosis not present

## 2015-07-17 DIAGNOSIS — G9340 Encephalopathy, unspecified: Secondary | ICD-10-CM | POA: Diagnosis not present

## 2015-07-17 DIAGNOSIS — R531 Weakness: Secondary | ICD-10-CM | POA: Diagnosis not present

## 2015-07-17 DIAGNOSIS — I10 Essential (primary) hypertension: Secondary | ICD-10-CM | POA: Diagnosis not present

## 2015-07-17 DIAGNOSIS — E119 Type 2 diabetes mellitus without complications: Secondary | ICD-10-CM | POA: Diagnosis not present

## 2015-07-17 DIAGNOSIS — E785 Hyperlipidemia, unspecified: Secondary | ICD-10-CM | POA: Diagnosis not present

## 2015-07-17 DIAGNOSIS — D649 Anemia, unspecified: Secondary | ICD-10-CM | POA: Diagnosis not present

## 2015-07-17 DIAGNOSIS — I2510 Atherosclerotic heart disease of native coronary artery without angina pectoris: Secondary | ICD-10-CM | POA: Diagnosis not present

## 2015-07-17 DIAGNOSIS — R41841 Cognitive communication deficit: Secondary | ICD-10-CM | POA: Diagnosis not present

## 2015-07-17 DIAGNOSIS — E782 Mixed hyperlipidemia: Secondary | ICD-10-CM | POA: Diagnosis not present

## 2015-07-17 DIAGNOSIS — J449 Chronic obstructive pulmonary disease, unspecified: Secondary | ICD-10-CM | POA: Diagnosis not present

## 2015-07-17 DIAGNOSIS — M6281 Muscle weakness (generalized): Secondary | ICD-10-CM | POA: Diagnosis not present

## 2015-07-17 DIAGNOSIS — F339 Major depressive disorder, recurrent, unspecified: Secondary | ICD-10-CM | POA: Diagnosis not present

## 2015-07-17 DIAGNOSIS — E7800 Pure hypercholesterolemia, unspecified: Secondary | ICD-10-CM | POA: Diagnosis not present

## 2015-07-17 DIAGNOSIS — D519 Vitamin B12 deficiency anemia, unspecified: Secondary | ICD-10-CM | POA: Diagnosis not present

## 2015-07-17 DIAGNOSIS — J309 Allergic rhinitis, unspecified: Secondary | ICD-10-CM | POA: Diagnosis not present

## 2015-07-18 DIAGNOSIS — G9340 Encephalopathy, unspecified: Secondary | ICD-10-CM | POA: Diagnosis not present

## 2015-07-18 DIAGNOSIS — R531 Weakness: Secondary | ICD-10-CM | POA: Diagnosis not present

## 2015-07-18 DIAGNOSIS — I959 Hypotension, unspecified: Secondary | ICD-10-CM | POA: Diagnosis not present

## 2015-07-18 DIAGNOSIS — J449 Chronic obstructive pulmonary disease, unspecified: Secondary | ICD-10-CM | POA: Diagnosis not present

## 2015-07-19 DIAGNOSIS — K5900 Constipation, unspecified: Secondary | ICD-10-CM | POA: Diagnosis not present

## 2015-07-19 DIAGNOSIS — E119 Type 2 diabetes mellitus without complications: Secondary | ICD-10-CM | POA: Diagnosis not present

## 2015-08-23 DIAGNOSIS — R531 Weakness: Secondary | ICD-10-CM | POA: Diagnosis not present

## 2015-08-23 DIAGNOSIS — I10 Essential (primary) hypertension: Secondary | ICD-10-CM | POA: Diagnosis not present

## 2015-08-23 DIAGNOSIS — I959 Hypotension, unspecified: Secondary | ICD-10-CM | POA: Diagnosis not present

## 2015-08-23 DIAGNOSIS — J449 Chronic obstructive pulmonary disease, unspecified: Secondary | ICD-10-CM | POA: Diagnosis not present

## 2015-09-26 DIAGNOSIS — E46 Unspecified protein-calorie malnutrition: Secondary | ICD-10-CM | POA: Diagnosis not present

## 2015-09-26 DIAGNOSIS — R531 Weakness: Secondary | ICD-10-CM | POA: Diagnosis not present

## 2015-09-26 DIAGNOSIS — I10 Essential (primary) hypertension: Secondary | ICD-10-CM | POA: Diagnosis not present

## 2015-09-26 DIAGNOSIS — J449 Chronic obstructive pulmonary disease, unspecified: Secondary | ICD-10-CM | POA: Diagnosis not present

## 2015-10-04 DIAGNOSIS — M6281 Muscle weakness (generalized): Secondary | ICD-10-CM | POA: Diagnosis not present

## 2016-04-04 ENCOUNTER — Other Ambulatory Visit (HOSPITAL_COMMUNITY): Payer: Self-pay | Admitting: Nurse Practitioner

## 2016-04-04 DIAGNOSIS — R0789 Other chest pain: Secondary | ICD-10-CM

## 2016-04-17 ENCOUNTER — Ambulatory Visit (HOSPITAL_COMMUNITY)
Admission: RE | Admit: 2016-04-17 | Discharge: 2016-04-17 | Disposition: A | Discharge status: Home / Self Care | Payer: Medicare Other | Source: Ambulatory Visit | Attending: Nurse Practitioner | Admitting: Nurse Practitioner

## 2016-04-17 DIAGNOSIS — R0789 Other chest pain: Secondary | ICD-10-CM | POA: Diagnosis present

## 2016-04-17 DIAGNOSIS — J439 Emphysema, unspecified: Secondary | ICD-10-CM | POA: Insufficient documentation

## 2016-05-03 ENCOUNTER — Emergency Department (HOSPITAL_COMMUNITY): Payer: Medicare Other

## 2016-05-03 ENCOUNTER — Encounter (HOSPITAL_COMMUNITY): Payer: Self-pay | Admitting: Emergency Medicine

## 2016-05-03 ENCOUNTER — Inpatient Hospital Stay (HOSPITAL_COMMUNITY)
Admission: EM | Admit: 2016-05-03 | Discharge: 2016-05-06 | DRG: 480 | Disposition: A | Discharge status: Skilled Nursing Facility | Payer: Medicare Other | Attending: Family Medicine | Admitting: Family Medicine

## 2016-05-03 DIAGNOSIS — Z6820 Body mass index (BMI) 20.0-20.9, adult: Secondary | ICD-10-CM | POA: Diagnosis not present

## 2016-05-03 DIAGNOSIS — R4189 Other symptoms and signs involving cognitive functions and awareness: Secondary | ICD-10-CM | POA: Diagnosis present

## 2016-05-03 DIAGNOSIS — Z951 Presence of aortocoronary bypass graft: Secondary | ICD-10-CM | POA: Diagnosis not present

## 2016-05-03 DIAGNOSIS — Z85118 Personal history of other malignant neoplasm of bronchus and lung: Secondary | ICD-10-CM | POA: Diagnosis not present

## 2016-05-03 DIAGNOSIS — F329 Major depressive disorder, single episode, unspecified: Secondary | ICD-10-CM | POA: Diagnosis not present

## 2016-05-03 DIAGNOSIS — W07XXXA Fall from chair, initial encounter: Secondary | ICD-10-CM | POA: Diagnosis present

## 2016-05-03 DIAGNOSIS — I2510 Atherosclerotic heart disease of native coronary artery without angina pectoris: Secondary | ICD-10-CM | POA: Diagnosis not present

## 2016-05-03 DIAGNOSIS — S72143A Displaced intertrochanteric fracture of unspecified femur, initial encounter for closed fracture: Secondary | ICD-10-CM

## 2016-05-03 DIAGNOSIS — Z7982 Long term (current) use of aspirin: Secondary | ICD-10-CM

## 2016-05-03 DIAGNOSIS — E43 Unspecified severe protein-calorie malnutrition: Secondary | ICD-10-CM | POA: Diagnosis present

## 2016-05-03 DIAGNOSIS — I4510 Unspecified right bundle-branch block: Secondary | ICD-10-CM | POA: Diagnosis not present

## 2016-05-03 DIAGNOSIS — I10 Essential (primary) hypertension: Secondary | ICD-10-CM | POA: Diagnosis present

## 2016-05-03 DIAGNOSIS — Z79899 Other long term (current) drug therapy: Secondary | ICD-10-CM

## 2016-05-03 DIAGNOSIS — F419 Anxiety disorder, unspecified: Secondary | ICD-10-CM | POA: Diagnosis present

## 2016-05-03 DIAGNOSIS — Z87891 Personal history of nicotine dependence: Secondary | ICD-10-CM | POA: Diagnosis not present

## 2016-05-03 DIAGNOSIS — E1069 Type 1 diabetes mellitus with other specified complication: Secondary | ICD-10-CM

## 2016-05-03 DIAGNOSIS — I252 Old myocardial infarction: Secondary | ICD-10-CM

## 2016-05-03 DIAGNOSIS — S72142A Displaced intertrochanteric fracture of left femur, initial encounter for closed fracture: Secondary | ICD-10-CM | POA: Diagnosis not present

## 2016-05-03 DIAGNOSIS — J438 Other emphysema: Secondary | ICD-10-CM

## 2016-05-03 DIAGNOSIS — E785 Hyperlipidemia, unspecified: Secondary | ICD-10-CM | POA: Diagnosis not present

## 2016-05-03 DIAGNOSIS — W19XXXA Unspecified fall, initial encounter: Secondary | ICD-10-CM | POA: Diagnosis not present

## 2016-05-03 DIAGNOSIS — S72145A Nondisplaced intertrochanteric fracture of left femur, initial encounter for closed fracture: Secondary | ICD-10-CM | POA: Diagnosis not present

## 2016-05-03 DIAGNOSIS — J449 Chronic obstructive pulmonary disease, unspecified: Secondary | ICD-10-CM | POA: Diagnosis present

## 2016-05-03 DIAGNOSIS — Z794 Long term (current) use of insulin: Secondary | ICD-10-CM

## 2016-05-03 DIAGNOSIS — E1165 Type 2 diabetes mellitus with hyperglycemia: Secondary | ICD-10-CM | POA: Diagnosis present

## 2016-05-03 DIAGNOSIS — S72009A Fracture of unspecified part of neck of unspecified femur, initial encounter for closed fracture: Secondary | ICD-10-CM

## 2016-05-03 LAB — BASIC METABOLIC PANEL
Anion gap: 7 (ref 5–15)
BUN: 20 mg/dL (ref 6–20)
CHLORIDE: 108 mmol/L (ref 101–111)
CO2: 26 mmol/L (ref 22–32)
CREATININE: 1.15 mg/dL (ref 0.61–1.24)
Calcium: 9.5 mg/dL (ref 8.9–10.3)
GFR calc Af Amer: >60 mL/min (ref >60)
GFR calc non Af Amer: 56 mL/min — ABNORMAL LOW (ref >60)
GLUCOSE: 108 mg/dL — AB (ref 65–99)
POTASSIUM: 4.1 mmol/L (ref 3.5–5.1)
Sodium: 141 mmol/L (ref 135–145)

## 2016-05-03 LAB — PROTIME-INR
INR: 1.12
PROTHROMBIN TIME: 14.5 s (ref 11.4–15.2)

## 2016-05-03 LAB — CBC WITH DIFFERENTIAL/PLATELET
Basophils Absolute: 0 10*3/uL (ref 0.0–0.1)
Basophils Relative: 0 %
EOS ABS: 0.2 10*3/uL (ref 0.0–0.7)
EOS PCT: 2 %
HCT: 39.8 % (ref 39.0–52.0)
Hemoglobin: 13.2 g/dL (ref 13.0–17.0)
LYMPHS ABS: 1.1 10*3/uL (ref 0.7–4.0)
LYMPHS PCT: 11 %
MCH: 29.9 pg (ref 26.0–34.0)
MCHC: 33.2 g/dL (ref 30.0–36.0)
MCV: 90.2 fL (ref 78.0–100.0)
MONOS PCT: 6 %
Monocytes Absolute: 0.6 10*3/uL (ref 0.1–1.0)
Neutro Abs: 8 10*3/uL — ABNORMAL HIGH (ref 1.7–7.7)
Neutrophils Relative %: 80 %
PLATELETS: 209 10*3/uL (ref 150–400)
RBC: 4.41 MIL/uL (ref 4.22–5.81)
RDW: 14.2 % (ref 11.5–15.5)
WBC: 10 10*3/uL (ref 4.0–10.5)

## 2016-05-03 LAB — URINALYSIS, ROUTINE W REFLEX MICROSCOPIC
BILIRUBIN URINE: NEGATIVE
Glucose, UA: NEGATIVE mg/dL
Hgb urine dipstick: NEGATIVE
KETONES UR: NEGATIVE mg/dL
LEUKOCYTES UA: NEGATIVE
NITRITE: NEGATIVE
PH: 6 (ref 5.0–8.0)
PROTEIN: NEGATIVE mg/dL
SPECIFIC GRAVITY, URINE: 1.018 (ref 1.005–1.030)

## 2016-05-03 LAB — TYPE AND SCREEN
ABO/RH(D): B POS
Antibody Screen: NEGATIVE

## 2016-05-03 LAB — GLUCOSE, CAPILLARY: Glucose-Capillary: 179 mg/dL — ABNORMAL HIGH (ref 65–99)

## 2016-05-03 MED ORDER — SENNA 8.6 MG PO TABS
1.0000 | ORAL_TABLET | Freq: Two times a day (BID) | ORAL | Status: DC
  Administered 2016-05-04 – 2016-05-06 (×4): 8.6 mg via ORAL
  Filled 2016-05-03 (×4): qty 1

## 2016-05-03 MED ORDER — ATORVASTATIN CALCIUM 40 MG PO TABS
40.0000 mg | ORAL_TABLET | Freq: Every day | ORAL | Status: DC
  Administered 2016-05-05 – 2016-05-06 (×2): 40 mg via ORAL
  Filled 2016-05-03 (×2): qty 1

## 2016-05-03 MED ORDER — HEPARIN SODIUM (PORCINE) 5000 UNIT/ML IJ SOLN
5000.0000 [IU] | Freq: Three times a day (TID) | INTRAMUSCULAR | Status: DC
Start: 2016-05-03 — End: 2016-05-06
  Administered 2016-05-04 – 2016-05-06 (×6): 5000 [IU] via SUBCUTANEOUS
  Filled 2016-05-03 (×6): qty 1

## 2016-05-03 MED ORDER — IPRATROPIUM-ALBUTEROL 0.5-2.5 (3) MG/3ML IN SOLN: 3.0000 mL | Freq: Four times a day (QID) | RESPIRATORY_TRACT | Status: DC | PRN

## 2016-05-03 MED ORDER — ASPIRIN 325 MG PO TABS: 325.0000 mg | ORAL_TABLET | Freq: Every day | ORAL | Status: DC

## 2016-05-03 MED ORDER — FENTANYL CITRATE (PF) 100 MCG/2ML IJ SOLN
50.0000 ug | INTRAMUSCULAR | Status: AC | PRN
  Administered 2016-05-03 (×2): 50 ug via INTRAVENOUS
  Filled 2016-05-03: qty 2

## 2016-05-03 MED ORDER — HYDROMORPHONE HCL 1 MG/ML IJ SOLN
0.5000 mg | Freq: Once | INTRAMUSCULAR | Status: AC
Start: 2016-05-03 — End: 2016-05-03
  Administered 2016-05-03: 0.5 mg via INTRAVENOUS
  Filled 2016-05-03: qty 1

## 2016-05-03 MED ORDER — HYDROCODONE-ACETAMINOPHEN 5-325 MG PO TABS
1.0000 | ORAL_TABLET | Freq: Four times a day (QID) | ORAL | Status: DC | PRN
  Administered 2016-05-04 – 2016-05-06 (×6): 2 via ORAL
  Filled 2016-05-03 (×6): qty 2

## 2016-05-03 MED ORDER — HYDROMORPHONE HCL 1 MG/ML IJ SOLN
0.5000 mg | Freq: Once | INTRAMUSCULAR | Status: AC
  Administered 2016-05-03: 0.5 mg via INTRAVENOUS
  Filled 2016-05-03: qty 1

## 2016-05-03 MED ORDER — ISOSORBIDE MONONITRATE ER 30 MG PO TB24
30.0000 mg | ORAL_TABLET | Freq: Every day | ORAL | Status: DC
  Administered 2016-05-05 – 2016-05-06 (×2): 30 mg via ORAL
  Filled 2016-05-03 (×2): qty 1

## 2016-05-03 MED ORDER — DOCUSATE SODIUM 100 MG PO CAPS
100.0000 mg | ORAL_CAPSULE | Freq: Two times a day (BID) | ORAL | Status: DC
  Administered 2016-05-03 – 2016-05-06 (×5): 100 mg via ORAL
  Filled 2016-05-03 (×5): qty 1

## 2016-05-03 MED ORDER — CITALOPRAM HYDROBROMIDE 10 MG PO TABS
10.0000 mg | ORAL_TABLET | Freq: Every day | ORAL | Status: DC
  Administered 2016-05-05 – 2016-05-06 (×2): 10 mg via ORAL
  Filled 2016-05-03 (×2): qty 1

## 2016-05-03 MED ORDER — DILTIAZEM HCL ER COATED BEADS 240 MG PO CP24
240.0000 mg | ORAL_CAPSULE | Freq: Every day | ORAL | Status: DC
  Administered 2016-05-05 – 2016-05-06 (×2): 240 mg via ORAL
  Filled 2016-05-03 (×2): qty 1

## 2016-05-03 MED ORDER — HYDROMORPHONE HCL 1 MG/ML IJ SOLN
0.5000 mg | INTRAMUSCULAR | Status: DC | PRN
  Administered 2016-05-04: 0.5 mg via INTRAVENOUS
  Filled 2016-05-03: qty 1

## 2016-05-03 MED ORDER — INSULIN GLARGINE 100 UNIT/ML ~~LOC~~ SOLN
5.0000 [IU] | Freq: Every day | SUBCUTANEOUS | Status: DC
  Filled 2016-05-03: qty 0.05

## 2016-05-03 MED ORDER — DONEPEZIL HCL 5 MG PO TABS
5.0000 mg | ORAL_TABLET | Freq: Every day | ORAL | Status: DC
  Administered 2016-05-03 – 2016-05-05 (×3): 5 mg via ORAL
  Filled 2016-05-03 (×3): qty 1

## 2016-05-03 MED ORDER — MOMETASONE FURO-FORMOTEROL FUM 200-5 MCG/ACT IN AERO
2.0000 | INHALATION_SPRAY | Freq: Two times a day (BID) | RESPIRATORY_TRACT | Status: DC
  Administered 2016-05-04 – 2016-05-06 (×4): 2 via RESPIRATORY_TRACT
  Filled 2016-05-03: qty 8.8

## 2016-05-03 MED ORDER — SODIUM CHLORIDE 0.9 % IV BOLUS (SEPSIS)
1000.0000 mL | Freq: Once | INTRAVENOUS | Status: AC
  Administered 2016-05-03: 1000 mL via INTRAVENOUS

## 2016-05-03 MED ORDER — TIOTROPIUM BROMIDE MONOHYDRATE 18 MCG IN CAPS
18.0000 ug | ORAL_CAPSULE | Freq: Every day | RESPIRATORY_TRACT | Status: DC
  Administered 2016-05-05 – 2016-05-06 (×2): 18 ug via RESPIRATORY_TRACT
  Filled 2016-05-03: qty 5

## 2016-05-03 MED ORDER — OXYBUTYNIN CHLORIDE ER 10 MG PO TB24
10.0000 mg | ORAL_TABLET | Freq: Every day | ORAL | Status: DC
  Administered 2016-05-03 – 2016-05-05 (×3): 10 mg via ORAL
  Filled 2016-05-03 (×3): qty 1

## 2016-05-03 MED ORDER — INSULIN ASPART 100 UNIT/ML ~~LOC~~ SOLN
0.0000 [IU] | Freq: Three times a day (TID) | SUBCUTANEOUS | Status: DC
  Administered 2016-05-04: 1 [IU] via SUBCUTANEOUS
  Administered 2016-05-05 – 2016-05-06 (×4): 2 [IU] via SUBCUTANEOUS

## 2016-05-03 NOTE — ED Notes (Signed)
Report given to [facility] for transport and then they called to say this patient would have to wait due to another critical patient here in the e.d. That would have to go first.  [facility] will be called for transport of this patient

## 2016-05-03 NOTE — H&P (Signed)
History and Physical  Thomas [name] JJK:[id number] DOB: 03-31-1927 DOA: 05/03/2016   PCP: Glo Herring, MD   Patient coming from: [facility] Chief Complaint: left hip pain  HPI:  Thomas [name] is a 83 y.o. male with medical history of hypertension, diabetes mellitus, lung cancer status post resection, COPD, anxiety, coronary artery disease presenting after a mechanical fall while he was getting out of the chair. The chair slipped backwards from underneath him, and the patient fell onto his left side. He had immediate pain and could not get up. The patient denies any syncope or loss of consciousness. EMS was activated and the patient was brought to the emergency department for further evaluation. X-ray of the left hip revealed a left femoral intertrochanteric femur fracture with varus angulation.  Orthopedics, Dr. Aline Brochure was consulted and felt that the patient was high medical risk for operation at [facility].  Therefore, the patient will be transferred to [facility] for surgery.  EDP spoke with Dr. Fredonia Highland who agreed to see patient at [facility].  He requested pt be NPO after [facility] for surgery on 05/05/16.  Patient denies fevers, chills, headache, chest pain, dyspnea, nausea, vomiting, diarrhea, abdominal pain, dysuria, hematuria, hematochezia, and melena.  In the emergency department, the patient was afebrile and hemodynamically stable saturating well on room air. BMP was unremarkable. WBC was 10.0. Otherwise CBC was unremarkable. Chest x-ray showed right basilar scarring. X-ray of the left hip revealed left femoral intertrochanteric fracture with varus angulation.  Assessment/Plan: Left femoral intertrochanteric rupture -Due to the patient's high medical risk, Dr. Aline Brochure requested transfer to [facility] -Dr. Fredonia Highland was subsequently consulted and plans operation in am 05/05/16 -pain control -PT/OT after surgery -DVT prophylaxis per ortho -the patient is an intermediate medical risk for surgery  according to the revised cardiac risk index, but no additional pre-op testing will alter that risk presently  Hypertension -Well-controlled -Continue diltiazem CD  Diabetes mellitus type 2 -Start reduced dose Lantus -NovoLog sliding scale -Hemoglobin A1c  Coronary artery disease -Patient is without any chest pain -EKG -Chest x-ray shows right basilar scarring without other acute findings  COPD -Stable on room air -Continue Symbicort and Spiriva  Hyperlipidemia -Continue statin  Cognitive impairment -Continue Aricept  Anxiety/depression -Continue home dose Celexa and Xanax        Past Medical History:  Diagnosis Date  . Anginal pain ([place])   . Cancer ([place])   . Diabetes mellitus   . Hyperlipemia   . Lung cancer ([place])   . Prostate cancer [facility])    Past Surgical History:  Procedure Laterality Date  . BACK SURGERY    . CARDIAC SURGERY    . PROSTATE SURGERY    . surgery to repair aneurysm    . TUMOR REMOVAL     from lung   Social History:  reports that he quit smoking about 22 years ago. His smoking use included Cigarettes. He has a 50.00 pack-year smoking history. He has quit using smokeless tobacco. His smokeless tobacco use included Chew. He reports that he does not drink alcohol or use drugs.   History reviewed. No pertinent family history.   No Known Allergies   Prior to Admission medications   Medication Sig Start Date End Date Taking? Authorizing Provider  acetaminophen (TYLENOL) 500 MG tablet Take 1,000 mg by mouth every 8 (eight) hours as needed for moderate pain.   Yes Historical Provider, MD  aspirin 325 MG tablet Take 325 mg by  mouth daily.   Yes Historical Provider, MD  atorvastatin (LIPITOR) 40 MG tablet Take 40 mg by mouth daily.   Yes Historical Provider, MD  budesonide-formoterol (SYMBICORT) 160-4.5 MCG/ACT inhaler Inhale 2 puffs into the lungs 2 (two) times daily.   Yes Historical Provider, MD  citalopram (CELEXA) 10 MG tablet Take 1  tablet (10 mg total) by mouth daily. 07/10/15  Yes Erline Hau, MD  diltiazem (CARDIZEM CD) 240 MG 24 hr capsule TAKE ONE CAPSULE ORALLY EVERY MORNING. 03/01/15  Yes Lendon Colonel, NP  donepezil (ARICEPT) 5 MG tablet Take 5 mg by mouth at bedtime.   Yes Historical Provider, MD  insulin glargine (LANTUS) 100 UNIT/ML injection Inject 0-30 Units into the skin 2 (two) times daily. Injects according to home sliding scale   Yes Historical Provider, MD  ipratropium-albuterol (DUONEB) 0.5-2.5 (3) MG/3ML SOLN Take 3 mLs by nebulization every 6 (six) hours as needed (dyspnea cough/wheezing).   Yes Historical Provider, MD  isosorbide mononitrate (IMDUR) 30 MG 24 hr tablet TAKE ONE TABLET BY MOUTH ONCE DAILY. 02/17/15  Yes Lendon Colonel, NP  oxybutynin (DITROPAN-XL) 10 MG 24 hr tablet Take 10 mg by mouth at bedtime.   Yes Historical Provider, MD  tiotropium (SPIRIVA) 18 MCG inhalation capsule Place 18 mcg into inhaler and inhale daily.   Yes Historical Provider, MD  Vitamin D, Ergocalciferol, (DRISDOL) 50000 units CAPS capsule Take 50,000 Units by mouth every 7 (seven) days.   Yes Historical Provider, MD  ONE TOUCH ULTRA TEST test strip  10/19/13   Historical Provider, MD    Review of Systems:  Constitutional:  No weight loss, night sweats, Fevers, chills, fatigue.  Head&Eyes: No headache.  No vision loss.  No eye pain or scotoma ENT:  No Difficulty swallowing,Tooth/dental problems,Sore throat,  No ear ache, post nasal drip,  Cardio-vascular:  No chest pain, Orthopnea, PND, swelling in lower extremities,  dizziness, palpitations  GI:  No  abdominal pain, nausea, vomiting, diarrhea, loss of appetite, hematochezia, melena, heartburn, indigestion, Resp:  No shortness of breath with exertion or at rest. No cough. No coughing up of blood .No wheezing.No chest wall deformity  Skin:  no rash or lesions.  GU:  no dysuria, change in color of urine, no urgency or frequency. No flank pain.    Musculoskeletal:  c/o left hip pain No decreased range of motion. No back pain.  Psych:  No change in mood or affect. No depression or anxiety. Neurologic: No headache, no dysesthesia, no focal weakness, no vision loss. No syncope  Physical Exam: Vitals:   05/03/16 1225 05/03/16 1400 05/03/16 1415 05/03/16 1424  BP: 118/64 (!) 109/59  (!) 116/56  Pulse: 65 60 (!) 58 (!) 58  Resp: 20     Temp: 97.4 F (36.3 C)     TempSrc: Oral     SpO2: 99% 100% 99% 100%  Weight: 68 kg (150 lb)     Height: '5\' 11"'$  (1.803 m)      General:  A&O x 3, NAD, nontoxic, pleasant/cooperative Head/Eye: No conjunctival hemorrhage, no icterus, [place]/AT, No nystagmus ENT:  No icterus,  No thrush, good dentition, no pharyngeal exudate Neck:  No masses, no lymphadenpathy, no bruits CV:  RRR, no rub, no gallop, no S3 Lung:  Diminished breath sounds but clear to auscultation. No wheezing. Good air movement. Abdomen: soft/NT, +BS, nondistended, no peritoneal signs Ext: No cyanosis, No rashes, No petechiae, No lymphangitis, trace LE edema; left lower extremity shortening Neuro: CNII-XII  intact, strength 4/5 in bilateral upper, no dysmetria  Labs on Admission:  Basic Metabolic Panel:  Recent Labs Lab 05/03/16 1342  NA 141  K 4.1  CL 108  CO2 26  GLUCOSE 108*  BUN 20  CREATININE 1.15  CALCIUM 9.5   Liver Function Tests: No results for input(s): AST, ALT, ALKPHOS, BILITOT, PROT, ALBUMIN in the last 168 hours. No results for input(s): LIPASE, AMYLASE in the last 168 hours. No results for input(s): AMMONIA in the last 168 hours. CBC:  Recent Labs Lab 05/03/16 1342  WBC 10.0  NEUTROABS 8.0*  HGB 13.2  HCT 39.8  MCV 90.2  PLT 209   Coagulation Profile:  Recent Labs Lab 05/03/16 1342  INR 1.12   Cardiac Enzymes: No results for input(s): CKTOTAL, CKMB, CKMBINDEX, TROPONINI in the last 168 hours. BNP: Invalid input(s): POCBNP CBG: No results for input(s): GLUCAP in the last 168 hours. Urine  analysis:    Component Value Date/Time   COLORURINE YELLOW 05/03/2016 1331   APPEARANCEUR CLEAR 05/03/2016 1331   LABSPEC 1.018 05/03/2016 1331   PHURINE 6.0 05/03/2016 1331   GLUCOSEU NEGATIVE 05/03/2016 1331   HGBUR NEGATIVE 05/03/2016 1331   BILIRUBINUR NEGATIVE 05/03/2016 1331   KETONESUR NEGATIVE 05/03/2016 1331   PROTEINUR NEGATIVE 05/03/2016 1331   UROBILINOGEN 0.2 11/13/2012 1238   NITRITE NEGATIVE 05/03/2016 1331   LEUKOCYTESUR NEGATIVE 05/03/2016 1331   Sepsis Labs: '@LABRCNTIP'$ (procalcitonin:4,lacticidven:4) )No results found for this or any previous visit (from the past 240 hour(s)).   Radiological Exams on Admission: Dg Chest 1 View  Result Date: 05/03/2016 CLINICAL DATA:  Fall.  Left hip fracture. EXAM: CHEST 1 VIEW COMPARISON:  07/06/2015 FINDINGS: Prior CABG. Mild elevation of the right hemidiaphragm with right base atelectasis or scarring. Scarring throughout the mid and upper right lung. Heart is normal size. Left lung is clear. No effusions or acute bony abnormality. IMPRESSION: Areas of scarring in the right lung. Right basilar scarring or atelectasis. Electronically Signed   By: Rolm Baptise M.D.   On: 05/03/2016 13:19   Dg Hip Unilat With Pelvis 2-3 Views Left  Result Date: 05/03/2016 CLINICAL DATA:  Fall backwards.  Left hip pain. EXAM: DG HIP (WITH OR WITHOUT PELVIS) 2-3V LEFT COMPARISON:  None. FINDINGS: There is a left femoral intertrochanteric fracture. Mild varus angulation. No subluxation or dislocation. Penile implant noted. Surgical clips along the pelvic floor. IMPRESSION: Left femoral intertrochanteric fracture with varus angulation. Electronically Signed   By: Rolm Baptise M.D.   On: 05/03/2016 13:16    EKG: Independently reviewed. pending    Time spent:60 minutes Code Status:   FULL Family Communication:  No Family at bedside Disposition Plan: expect 2-3 day hospitalization Consults called: Ortho--T. Percell Miller DVT Prophylaxis: [place] Heparin     Isreal Moline,  Akbar Sacra, DO  [facility] Pager [phone]  If 7PM-7AM, please contact night-coverage [email or web address] Password [email or web address] 05/03/2016, 3:16 PM

## 2016-05-03 NOTE — ED Notes (Signed)
Pt taken to xray 

## 2016-05-03 NOTE — Progress Notes (Signed)
Patient ID: Thomas [name], male   DOB: 1927-10-24, 83 y.o.   MRN: [id number] Called to evalutate 83 yo male with cad, s/p thoracotomy, copd, afib and diabetes who fell and fractured his left hip. Apparently had lund cancer.  xrays of left hip 3 part intertrochanteric fracture   Amenable to DHS  GAMMA NAIL type device  [facility] only on call this weekend.  He is deemed to high a risk with current staffing at [facility]  I have recommended transfer   He is stable   CBC Latest Ref Rng & Units 07/07/2015 07/06/2015 07/06/2015  WBC 4.0 - 10.5 K/uL 4.2 7.2 -  Hemoglobin 13.0 - 17.0 g/dL 11.6(L) 12.1(L) -  Hematocrit 39.0 - 52.0 % 35.1(L) 36.5(L) 37.3(L)  Platelets 150 - 400 K/uL 140(L) 168 -   BMP Latest Ref Rng & Units 07/07/2015 07/06/2015 12/06/2012  Glucose 65 - 99 mg/dL 109(H) 73 152(H)  BUN 6 - 20 mg/dL '12 16 14  '$ Creatinine 0.61 - 1.24 mg/dL 0.91 1.10 1.07  Sodium 135 - 145 mmol/L 138 140 138  Potassium 3.5 - 5.1 mmol/L 3.5 3.9 4.2  Chloride 101 - 111 mmol/L 108 110 101  CO2 22 - 32 mmol/L '26 28 30  '$ Calcium 8.9 - 10.3 mg/dL 8.6(L) 8.6(L) 10.0   Arther Abbott, MD

## 2016-05-03 NOTE — ED Notes (Signed)
[facility] stated will be a while for transportation

## 2016-05-03 NOTE — ED Provider Notes (Signed)
[facility] DEPT Provider Note   CSN: [id number] Arrival date & time: 05/03/16  1226   By signing my name below, I, [name], attest that this documentation has been prepared under the direction and in the presence of Merrily Pew, MD. Electronically Signed: [name], Scribe. 05/03/16. 1:57 PM. History   Chief Complaint Chief Complaint  Patient presents with  . Fall    The history is provided by the patient. No language interpreter was used.  HPI Comments: Thomas [name] is a 83 y.o. male with a hx of diabetes and HLD, who was brought in by ambulance to the Emergency Department complaining of left hip pain s/p fall that occurred prior to his arrival. He rates the pain 9/10. He states that he was getting out of a chair earlier today when the chair slipped from underneath him and he fell on his left side. He states that his hip pain worsens with movement of his left leg. The patient denies hx of hip injury. He denies hitting his head. He denies any other pain. He doesn't report taking anything prior to his arrival. Pain worse with movement and palpation.   Past Medical History:  Diagnosis Date  . Anginal pain ([place])   . Cancer ([place])   . Diabetes mellitus   . Hyperlipemia   . Lung cancer ([place])   . Prostate cancer [facility])     Patient Active Problem List   Diagnosis Date Noted  . Altered mental status 07/06/2015  . Normocytic anemia 07/06/2015  . Hypotension 07/06/2015  . Anxiety 07/06/2015  . Arterial hypotension   . Abnormal laboratory test result 11/01/2012  . Epigastric pain 09/30/2012  . NSTEMI (non-ST elevated myocardial infarction) ([place]) 09/30/2012  . Acute renal failure ([place]) 09/09/2012  . Acute encephalopathy 09/07/2012  . Malignant neoplasm of upper lobe, bronchus or lung 02/19/2011  . Right bundle branch block and left anterior fascicular block 03/31/2009  . CHEST PAIN-UNSPECIFIED 03/31/2009  . Insulin dependent diabetes mellitus ([place]) 07/18/2008    . HYPERLIPIDEMIA-MIXED 07/18/2008  . CAD, ARTERY BYPASS GRAFT 07/18/2008  . COPD (chronic obstructive pulmonary disease) ([place]) 07/18/2008    Past Surgical History:  Procedure Laterality Date  . BACK SURGERY    . CARDIAC SURGERY    . PROSTATE SURGERY    . surgery to repair aneurysm    . TUMOR REMOVAL     from lung       Home Medications    Prior to Admission medications   Medication Sig Start Date End Date Taking? Authorizing Provider  aspirin EC 81 MG EC tablet Take 1 tablet (81 mg total) by mouth daily. 10/02/12   Kathie Dike, MD  citalopram (CELEXA) 10 MG tablet Take 1 tablet (10 mg total) by mouth daily. 07/10/15   Erline Hau, MD  diltiazem (CARDIZEM CD) 240 MG 24 hr capsule TAKE ONE CAPSULE ORALLY EVERY MORNING. 03/01/15   Lendon Colonel, NP  fish oil-omega-3 fatty acids 1000 MG capsule Take 1 g by mouth daily.    Historical Provider, MD  fluticasone (FLONASE) 50 MCG/ACT nasal spray Place 2 sprays into both nostrils daily.    Historical Provider, MD  insulin glargine (LANTUS) 100 UNIT/ML injection Inject 0-30 Units into the skin 2 (two) times daily. Injects according to home sliding scale    Historical Provider, MD  isosorbide mononitrate (IMDUR) 30 MG 24 hr tablet TAKE ONE TABLET BY MOUTH ONCE DAILY. 02/17/15   Lendon Colonel, NP  ONE TOUCH ULTRA TEST  test strip  10/19/13   Historical Provider, MD  oxybutynin (DITROPAN) 5 MG tablet Take 5 mg by mouth 2 (two) times daily.    Historical Provider, MD  pravastatin (PRAVACHOL) 80 MG tablet TAKE 1 TABLET BY MOUTH DAILY IN THE EVENING. 05/09/15   Lendon Colonel, NP    Family History History reviewed. No pertinent family history.  Social History Social History  Substance Use Topics  . Smoking status: Former Smoker    Packs/day: 1.00    Years: 50.00    Types: Cigarettes    Quit date: 12/10/1993  . Smokeless tobacco: Former Systems developer    Types: Chew  . Alcohol use No     Allergies   Patient has no known  allergies.   Review of Systems Review of Systems  Constitutional: Negative for fatigue.  HENT: Negative for congestion.   Cardiovascular: Negative for chest pain.  Gastrointestinal: Negative for abdominal pain.  Genitourinary: Negative for difficulty urinating, dysuria and frequency.  Musculoskeletal: Positive for arthralgias and back pain.       Left Hip Pain  Skin: Negative for rash.  Neurological: Negative for headaches.  All other systems reviewed and are negative.   Physical Exam Updated Vital Signs BP 118/64 (BP Location: Right Arm)   Pulse 65   Temp 97.4 F (36.3 C) (Oral)   Resp 20   Ht '5\' 11"'$  (1.803 m)   Wt 150 lb (68 kg)   SpO2 99%   BMI 20.92 kg/m   Physical Exam  Constitutional: He is oriented to person, place, and time. He appears well-developed and well-nourished.  HENT:  Head: Normocephalic and atraumatic.  Head is atraumatic.  Eyes: EOM are normal.  Neck: Normal range of motion.  Cardiovascular: Normal rate, regular rhythm, normal heart sounds and intact distal pulses.   Pulmonary/Chest: Effort normal and breath sounds normal. No respiratory distress. He exhibits no tenderness.  Chest is non-tender to AP and lateral compression.  Abdominal: Soft. He exhibits no distension. There is no tenderness.  Musculoskeletal: He exhibits tenderness.       Right shoulder: He exhibits no deformity.  Right leg is non-tender to palpation and ROM. Pain over leg hip and pelvic area. Left leg is held externally rotated. Bilateral upper extremities without tenderness to palpation or ROM. No deformities.  Neurological: He is alert and oriented to person, place, and time.  Skin: Skin is warm and dry.  Psychiatric: He has a normal mood and affect. Judgment normal.  Nursing note and vitals reviewed.  ED Treatments / Results  DIAGNOSTIC STUDIES: Oxygen Saturation is 99% on RA, normal by my interpretation.    COORDINATION OF CARE: 1:29 PM Discussed treatment plan with pt  at bedside and pt agreed to plan. I will check the patient's left hip X-ray.  Labs (all labs ordered are listed, but only abnormal results are displayed) Labs Reviewed  BASIC METABOLIC PANEL  CBC WITH DIFFERENTIAL/PLATELET  PROTIME-INR  URINALYSIS, ROUTINE W REFLEX MICROSCOPIC  TYPE AND SCREEN    EKG  EKG Interpretation None       Radiology Dg Chest 1 View  Result Date: 05/03/2016 CLINICAL DATA:  Fall.  Left hip fracture. EXAM: CHEST 1 VIEW COMPARISON:  07/06/2015 FINDINGS: Prior CABG. Mild elevation of the right hemidiaphragm with right base atelectasis or scarring. Scarring throughout the mid and upper right lung. Heart is normal size. Left lung is clear. No effusions or acute bony abnormality. IMPRESSION: Areas of scarring in the right lung. Right basilar scarring or  atelectasis. Electronically Signed   By: Rolm Baptise M.D.   On: 05/03/2016 13:19   Dg Hip Unilat With Pelvis 2-3 Views Left  Result Date: 05/03/2016 CLINICAL DATA:  Fall backwards.  Left hip pain. EXAM: DG HIP (WITH OR WITHOUT PELVIS) 2-3V LEFT COMPARISON:  None. FINDINGS: There is a left femoral intertrochanteric fracture. Mild varus angulation. No subluxation or dislocation. Penile implant noted. Surgical clips along the pelvic floor. IMPRESSION: Left femoral intertrochanteric fracture with varus angulation. Electronically Signed   By: Rolm Baptise M.D.   On: 05/03/2016 13:16    Procedures Procedures (including critical care time)  Medications Ordered in ED Medications  fentaNYL (SUBLIMAZE) injection 50 mcg (50 mcg Intravenous Given 05/03/16 1347)  sodium chloride 0.9 % bolus 1,000 mL (1,000 mLs Intravenous New Bag/Given 05/03/16 1346)     Initial Impression / Assessment and Plan / ED Course  I have reviewed the triage vital signs and the nursing notes.  Pertinent labs & imaging results that were available during my care of the patient were reviewed by me and considered in my medical decision making (see  chart for details).     Left intertrochanteric hip fracture. Neurovascularly intact distally. Discussed case with on-call orthopedist, Dr. Aline Brochure, who discussed the case with the on-call anesthesiologist and they felt the patient was too high risk for surgery here. I discussed with Dr. Percell Miller in [place] and he said he would accept the patient and likely do surgery tomorrow morning and patient should be NPO after midnight. Medicine consult for admission and transfer.  Final Clinical Impressions(s) / ED Diagnoses   Final diagnoses:  Closed intertrochanteric fracture of hip, left, initial encounter [facility])   I personally performed the services described in this documentation, which was scribed in my presence. The recorded information has been reviewed and is accurate.    Merrily Pew, MD 05/03/16 [phone]

## 2016-05-03 NOTE — ED Notes (Signed)
Called [facility] for transport to [facility].  Per Abbe Amsterdam. "it may be awhile before a truck is available".. Nurse informed.

## 2016-05-03 NOTE — ED Notes (Signed)
[facility] states will be after 7pm before they can transport pt.

## 2016-05-03 NOTE — ED Triage Notes (Signed)
Pt was getting out of chair, slipped out of chair. Pt lost balance and fell on Left hip. 9/10 pain. No apparent injury. Pt unable to straightened leg.

## 2016-05-04 ENCOUNTER — Inpatient Hospital Stay (HOSPITAL_COMMUNITY): Payer: Medicare Other | Admitting: Certified Registered Nurse Anesthetist

## 2016-05-04 ENCOUNTER — Inpatient Hospital Stay (HOSPITAL_COMMUNITY): Payer: Medicare Other

## 2016-05-04 ENCOUNTER — Encounter (HOSPITAL_COMMUNITY): Payer: Self-pay | Admitting: Certified Registered Nurse Anesthetist

## 2016-05-04 ENCOUNTER — Encounter (HOSPITAL_COMMUNITY)
Admission: EM | Disposition: A | Discharge status: Skilled Nursing Facility | Payer: Self-pay | Source: Home / Self Care | Attending: Family Medicine

## 2016-05-04 DIAGNOSIS — W19XXXA Unspecified fall, initial encounter: Secondary | ICD-10-CM

## 2016-05-04 DIAGNOSIS — S72142A Displaced intertrochanteric fracture of left femur, initial encounter for closed fracture: Secondary | ICD-10-CM

## 2016-05-04 HISTORY — PX: INTRAMEDULLARY (IM) NAIL INTERTROCHANTERIC: SHX5875

## 2016-05-04 LAB — CBC
HCT: 37.4 % — ABNORMAL LOW (ref 39.0–52.0)
Hemoglobin: 12 g/dL — ABNORMAL LOW (ref 13.0–17.0)
MCH: 28.8 pg (ref 26.0–34.0)
MCHC: 32.1 g/dL (ref 30.0–36.0)
MCV: 89.9 fL (ref 78.0–100.0)
PLATELETS: 169 10*3/uL (ref 150–400)
RBC: 4.16 MIL/uL — ABNORMAL LOW (ref 4.22–5.81)
RDW: 14.4 % (ref 11.5–15.5)
WBC: 10.6 10*3/uL — ABNORMAL HIGH (ref 4.0–10.5)

## 2016-05-04 LAB — ABO/RH: ABO/RH(D): B POS

## 2016-05-04 LAB — GLUCOSE, CAPILLARY
GLUCOSE-CAPILLARY: 119 mg/dL — AB (ref 65–99)
GLUCOSE-CAPILLARY: 145 mg/dL — AB (ref 65–99)
Glucose-Capillary: 119 mg/dL — ABNORMAL HIGH (ref 65–99)
Glucose-Capillary: 130 mg/dL — ABNORMAL HIGH (ref 65–99)

## 2016-05-04 LAB — BASIC METABOLIC PANEL
Anion gap: 8 (ref 5–15)
BUN: 19 mg/dL (ref 6–20)
CALCIUM: 9.2 mg/dL (ref 8.9–10.3)
CHLORIDE: 107 mmol/L (ref 101–111)
CO2: 26 mmol/L (ref 22–32)
CREATININE: 1.11 mg/dL (ref 0.61–1.24)
GFR calc Af Amer: >60 mL/min (ref >60)
GFR calc non Af Amer: 58 mL/min — ABNORMAL LOW (ref >60)
GLUCOSE: 140 mg/dL — AB (ref 65–99)
Potassium: 4.3 mmol/L (ref 3.5–5.1)
Sodium: 141 mmol/L (ref 135–145)

## 2016-05-04 LAB — MRSA PCR SCREENING: MRSA BY PCR: NEGATIVE

## 2016-05-04 LAB — TYPE AND SCREEN
ABO/RH(D): B POS
ANTIBODY SCREEN: NEGATIVE

## 2016-05-04 SURGERY — FIXATION, FRACTURE, INTERTROCHANTERIC, WITH INTRAMEDULLARY ROD
Anesthesia: Spinal | Site: Hip | Laterality: Left

## 2016-05-04 MED ORDER — FENTANYL CITRATE (PF) 100 MCG/2ML IJ SOLN: 25.0000 ug | INTRAMUSCULAR | Status: DC | PRN

## 2016-05-04 MED ORDER — SODIUM CHLORIDE 0.9 % IV SOLN
INTRAVENOUS | Status: DC
  Administered 2016-05-05: 09:00:00 via INTRAVENOUS

## 2016-05-04 MED ORDER — FENTANYL CITRATE (PF) 100 MCG/2ML IJ SOLN
INTRAMUSCULAR | Status: AC
  Administered 2016-05-04: 50 ug via INTRAVENOUS
  Filled 2016-05-04: qty 2

## 2016-05-04 MED ORDER — ENSURE ENLIVE PO LIQD
237.0000 mL | Freq: Two times a day (BID) | ORAL | Status: DC
  Administered 2016-05-04 – 2016-05-06 (×4): 237 mL via ORAL

## 2016-05-04 MED ORDER — ASPIRIN 325 MG PO TABS
325.0000 mg | ORAL_TABLET | Freq: Every day | ORAL | Status: DC
  Administered 2016-05-04 – 2016-05-06 (×3): 325 mg via ORAL
  Filled 2016-05-04 (×3): qty 1

## 2016-05-04 MED ORDER — PROPOFOL 10 MG/ML IV BOLUS
INTRAVENOUS | Status: AC
  Filled 2016-05-04: qty 20

## 2016-05-04 MED ORDER — ONDANSETRON HCL 4 MG PO TABS: 4.0000 mg | ORAL_TABLET | Freq: Three times a day (TID) | ORAL | Status: DC | PRN

## 2016-05-04 MED ORDER — CEFAZOLIN SODIUM 1 G IJ SOLR
INTRAMUSCULAR | Status: DC | PRN
  Administered 2016-05-04: 2 g via INTRAMUSCULAR

## 2016-05-04 MED ORDER — MIDAZOLAM HCL 2 MG/2ML IJ SOLN
INTRAMUSCULAR | Status: AC
  Filled 2016-05-04: qty 2

## 2016-05-04 MED ORDER — ONDANSETRON HCL 4 MG/2ML IJ SOLN
INTRAMUSCULAR | Status: DC | PRN
  Administered 2016-05-04: 4 mg via INTRAVENOUS

## 2016-05-04 MED ORDER — FENTANYL CITRATE (PF) 250 MCG/5ML IJ SOLN
INTRAMUSCULAR | Status: AC
  Filled 2016-05-04: qty 5

## 2016-05-04 MED ORDER — ONDANSETRON HCL 4 MG/2ML IJ SOLN: 4.0000 mg | Freq: Once | INTRAMUSCULAR | Status: DC | PRN

## 2016-05-04 MED ORDER — FENTANYL CITRATE (PF) 100 MCG/2ML IJ SOLN
50.0000 ug | Freq: Once | INTRAMUSCULAR | Status: AC
  Administered 2016-05-04: 50 ug via INTRAVENOUS

## 2016-05-04 MED ORDER — LACTATED RINGERS IV SOLN
INTRAVENOUS | Status: DC
  Administered 2016-05-04: 10:00:00 via INTRAVENOUS

## 2016-05-04 MED ORDER — ONDANSETRON HCL 4 MG/2ML IJ SOLN
INTRAMUSCULAR | Status: AC
  Filled 2016-05-04: qty 2

## 2016-05-04 MED ORDER — FENTANYL CITRATE (PF) 100 MCG/2ML IJ SOLN
INTRAMUSCULAR | Status: DC | PRN
  Administered 2016-05-04: 50 ug via INTRAVENOUS

## 2016-05-04 MED ORDER — PROPOFOL 10 MG/ML IV BOLUS
INTRAVENOUS | Status: DC | PRN
  Administered 2016-05-04: 20 mg via INTRAVENOUS

## 2016-05-04 MED ORDER — OXYCODONE HCL 5 MG PO TABS: 5.0000 mg | ORAL_TABLET | Freq: Once | ORAL | Status: DC | PRN

## 2016-05-04 MED ORDER — PROPOFOL 500 MG/50ML IV EMUL
INTRAVENOUS | Status: DC | PRN
  Administered 2016-05-04: 50 ug/kg/min via INTRAVENOUS

## 2016-05-04 MED ORDER — OXYCODONE HCL 5 MG/5ML PO SOLN: 5.0000 mg | Freq: Once | ORAL | Status: DC | PRN

## 2016-05-04 MED ORDER — PHENYLEPHRINE HCL 10 MG/ML IJ SOLN
INTRAMUSCULAR | Status: DC | PRN
  Administered 2016-05-04 (×3): 80 ug via INTRAVENOUS

## 2016-05-04 MED ORDER — CEFAZOLIN SODIUM-DEXTROSE 2-4 GM/100ML-% IV SOLN
2.0000 g | Freq: Three times a day (TID) | INTRAVENOUS | Status: DC
  Administered 2016-05-04 – 2016-05-06 (×6): 2 g via INTRAVENOUS
  Filled 2016-05-04 (×8): qty 100

## 2016-05-04 MED ORDER — 0.9 % SODIUM CHLORIDE (POUR BTL) OPTIME
TOPICAL | Status: DC | PRN
  Administered 2016-05-04: 1000 mL

## 2016-05-04 SURGICAL SUPPLY — 45 items
APL SKNCLS STERI-STRIP NONHPOA (GAUZE/BANDAGES/DRESSINGS) ×1
BENZOIN TINCTURE PRP APPL 2/3 (GAUZE/BANDAGES/DRESSINGS) ×2 IMPLANT
BNDG COHESIVE 4X5 TAN STRL (GAUZE/BANDAGES/DRESSINGS) ×3 IMPLANT
BNDG GAUZE ELAST 4 BULKY (GAUZE/BANDAGES/DRESSINGS) ×3 IMPLANT
CLOSURE WOUND 1/2 X4 (GAUZE/BANDAGES/DRESSINGS) ×1
COVER PERINEAL POST (MISCELLANEOUS) ×3 IMPLANT
COVER SURGICAL LIGHT HANDLE (MISCELLANEOUS) ×3 IMPLANT
DRAPE STERI IOBAN 125X83 (DRAPES) ×3 IMPLANT
DRSG MEPILEX BORDER 4X4 (GAUZE/BANDAGES/DRESSINGS) ×6 IMPLANT
DURAPREP 26ML APPLICATOR (WOUND CARE) ×3 IMPLANT
ELECT REM PT RETURN 9FT ADLT (ELECTROSURGICAL) ×3
ELECTRODE REM PT RTRN 9FT ADLT (ELECTROSURGICAL) ×1 IMPLANT
GLOVE BIO SURGEON STRL SZ7 (GLOVE) ×2 IMPLANT
GLOVE BIO SURGEON STRL SZ7.5 (GLOVE) ×6 IMPLANT
GLOVE BIOGEL PI IND STRL 7.0 (GLOVE) IMPLANT
GLOVE BIOGEL PI IND STRL 7.5 (GLOVE) IMPLANT
GLOVE BIOGEL PI IND STRL 8 (GLOVE) ×2 IMPLANT
GLOVE BIOGEL PI INDICATOR 7.0 (GLOVE) ×2
GLOVE BIOGEL PI INDICATOR 7.5 (GLOVE) ×4
GLOVE BIOGEL PI INDICATOR 8 (GLOVE) ×4
GLOVE SKINSENSE NS SZ7.5 (GLOVE) ×6
GLOVE SKINSENSE STRL SZ7.5 (GLOVE) IMPLANT
GOWN STRL REUS W/ TWL LRG LVL3 (GOWN DISPOSABLE) ×3 IMPLANT
GOWN STRL REUS W/TWL LRG LVL3 (GOWN DISPOSABLE) ×9
GUIDEROD T2 3X1000 (ROD) ×2 IMPLANT
K-WIRE  3.2X450M STR (WIRE) ×2
K-WIRE 3.2X450M STR (WIRE) ×1
KIT NAIL LONG 10X140X125 (Nail) ×2 IMPLANT
KIT ROOM TURNOVER OR (KITS) ×3 IMPLANT
KWIRE 3.2X450M STR (WIRE) IMPLANT
MANIFOLD NEPTUNE II (INSTRUMENTS) ×1 IMPLANT
NS IRRIG 1000ML POUR BTL (IV SOLUTION) ×3 IMPLANT
PACK GENERAL/GYN (CUSTOM PROCEDURE TRAY) ×3 IMPLANT
PAD ARMBOARD 7.5X6 YLW CONV (MISCELLANEOUS) ×5 IMPLANT
PAD CAST 4YDX4 CTTN HI CHSV (CAST SUPPLIES) ×1 IMPLANT
PADDING CAST COTTON 4X4 STRL (CAST SUPPLIES)
SCREW LAG GAMMA 3 TI 10.5X100M (Screw) ×2 IMPLANT
STRIP CLOSURE SKIN 1/2X4 (GAUZE/BANDAGES/DRESSINGS) ×1 IMPLANT
SUT MNCRL AB 4-0 PS2 18 (SUTURE) ×2 IMPLANT
SUT MON AB 2-0 CT1 27 (SUTURE) ×1 IMPLANT
SUT MON AB 2-0 CT1 36 (SUTURE) ×2 IMPLANT
SUT VIC AB 0 CT1 27 (SUTURE) ×3
SUT VIC AB 0 CT1 27XBRD ANBCTR (SUTURE) ×1 IMPLANT
TOWEL OR 17X24 6PK STRL BLUE (TOWEL DISPOSABLE) ×3 IMPLANT
TOWEL OR 17X26 10 PK STRL BLUE (TOWEL DISPOSABLE) ×3 IMPLANT

## 2016-05-04 NOTE — Transfer of Care (Signed)
Immediate Anesthesia Transfer of Care Note  Patient: Thomas [name]  Procedure(s) Performed: Procedure(s): INTRAMEDULLARY (IM) NAIL INTERTROCHANTRIC (Left)  Patient Location: PACU  Anesthesia Type:Spinal  Level of Consciousness: awake, alert  and oriented  Airway & Oxygen Therapy: Patient Spontanous Breathing and Patient connected to nasal cannula oxygen  Post-op Assessment: Report given to RN and Post -op Vital signs reviewed and stable  Post vital signs: Reviewed and stable  Last Vitals:  Vitals:   05/04/16 0429 05/04/16 1120  BP: 111/60 (!) 92/52  Pulse: 70 82  Resp:  17  Temp: 36.9 C     Last Pain:  Vitals:   05/04/16 0934  TempSrc:   PainSc: Asleep         Complications: No apparent anesthesia complications

## 2016-05-04 NOTE — Anesthesia Postprocedure Evaluation (Addendum)
Anesthesia Post Note  Patient: Thomas [name]  Procedure(s) Performed: Procedure(s) (LRB): INTRAMEDULLARY (IM) NAIL INTERTROCHANTRIC (Left)  Patient location during evaluation: PACU Anesthesia Type: Spinal Level of consciousness: awake, awake and alert and oriented Pain management: pain level controlled Respiratory status: spontaneous breathing, nonlabored ventilation and respiratory function stable Cardiovascular status: blood pressure returned to baseline Postop Assessment: spinal receding Anesthetic complications: no       Last Vitals:  Vitals:   05/04/16 1133 05/04/16 1145  BP: (!) 104/50   Pulse:  84  Resp:  13  Temp:      Last Pain:  Vitals:   05/04/16 1145  TempSrc:   PainSc: 0-No pain                 Ermalee Mealy COKER

## 2016-05-04 NOTE — Anesthesia Preprocedure Evaluation (Signed)
Anesthesia Evaluation  Patient identified by MRN, date of birth, ID band  Reviewed: Allergy & Precautions, NPO status , Patient's Chart, lab work & pertinent test results  Airway Mallampati: II   Neck ROM: Full    Dental   Pulmonary former smoker,    breath sounds clear to auscultation       Cardiovascular  Rhythm:Regular Rate:Normal     Neuro/Psych    GI/Hepatic   Endo/Other  diabetes  Renal/GU      Musculoskeletal   Abdominal   Peds  Hematology   Anesthesia Other Findings   Reproductive/Obstetrics                             Anesthesia Physical Anesthesia Plan  ASA: III  Anesthesia Plan: Spinal   Post-op Pain Management:    Induction: Intravenous  Airway Management Planned: Simple Face Mask and Natural Airway  Additional Equipment:   Intra-op Plan:   Post-operative Plan:   Informed Consent: I have reviewed the patients History and Physical, chart, labs and discussed the procedure including the risks, benefits and alternatives for the proposed anesthesia with the patient or authorized representative who has indicated his/her understanding and acceptance.   Dental advisory given  Plan Discussed with: CRNA and Anesthesiologist  Anesthesia Plan Comments:         Anesthesia Quick Evaluation

## 2016-05-04 NOTE — Anesthesia Procedure Notes (Signed)
Spinal  Patient location during procedure: OR Start time: 05/04/2016 10:16 AM End time: 05/04/2016 10:19 AM Staffing Anesthesiologist: Linna Caprice, Lanaysia Fritchman Performed: anesthesiologist  Preanesthetic Checklist Completed: patient identified, site marked, surgical consent, pre-op evaluation, timeout performed, IV checked, risks and benefits discussed and monitors and equipment checked Spinal Block Patient position: left lateral decubitus Prep: Betadine Patient monitoring: heart rate, cardiac monitor, blood pressure and continuous pulse ox Approach: midline Location: L3-4 Injection technique: single-shot Needle Needle type: Tuohy  Needle gauge: 22 G Needle length: 9 cm Needle insertion depth: 5 cm Assessment Sensory level: T6 Additional Notes 14 mg 0.75% Bupivacaine injected easily

## 2016-05-04 NOTE — Progress Notes (Signed)
PT Cancellation Note  Patient Details Name: Thomas [name] MRN: [id number] DOB: November 27, 1927   Cancelled Treatment:    Reason Eval/Treat Not Completed: Medical issues which prohibited therapy;Patient not medically ready. Pt has not had surgery and was just transferred yesterday. Will hold PT eval until after surgery.    Scheryl Marten PT, DPT  [phone]   05/04/2016, 7:36 AM

## 2016-05-04 NOTE — Consult Note (Signed)
ORTHOPAEDIC CONSULTATION  REQUESTING PHYSICIAN: Thomas Pour, MD  Chief Complaint: left hip pain  HPI: Thomas [name] is a 83 y.o. male who complains of left hip pain.  Mr. Thomas [name] was getting up from his chair at the dining table when the chair slipped out from under him, landing on the floor on his left hip.  He has since had pain with weight bearing and movement of the LLE.  He rates his pain at a 5/10.  He currently resides at a SNF, and previous to the fall was able to ambulate without the assistance of a cane or walker. He has a PMH significant for DM, hypertension, COPD. CAD, hyperlipidemia and cognitive impairment.  He was initially brought to [facility] ED last night, but transferred to [facility] for surgical intervention d/t multiple comorbidities.    Past Medical History:  Diagnosis Date  . Anginal pain ([place])   . Cancer ([place])   . Diabetes mellitus   . Hyperlipemia   . Lung cancer ([place])   . Prostate cancer [facility])    Past Surgical History:  Procedure Laterality Date  . BACK SURGERY    . CARDIAC SURGERY    . PROSTATE SURGERY    . surgery to repair aneurysm    . TUMOR REMOVAL     from lung   Social History   Social History  . Marital status: Married    Spouse name: N/A  . Number of children: N/A  . Years of education: N/A   Social History Main Topics  . Smoking status: Former Smoker    Packs/day: 1.00    Years: 50.00    Types: Cigarettes    Quit date: 12/10/1993  . Smokeless tobacco: Former Systems developer    Types: Chew  . Alcohol use No  . Drug use: No  . Sexual activity: No   Other Topics Concern  . None   Social History Narrative  . None   History reviewed. No pertinent family history. No Known Allergies Prior to Admission medications   Medication Sig Start Date End Date Taking? Authorizing Provider  acetaminophen (TYLENOL) 500 MG tablet Take 1,000 mg by mouth every 8 (eight) hours as needed for moderate pain.   Yes Historical Provider, MD  aspirin 325 MG  tablet Take 325 mg by mouth daily.   Yes Historical Provider, MD  atorvastatin (LIPITOR) 40 MG tablet Take 40 mg by mouth daily.   Yes Historical Provider, MD  budesonide-formoterol (SYMBICORT) 160-4.5 MCG/ACT inhaler Inhale 2 puffs into the lungs 2 (two) times daily.   Yes Historical Provider, MD  citalopram (CELEXA) 10 MG tablet Take 1 tablet (10 mg total) by mouth daily. 07/10/15  Yes Erline Hau, MD  diltiazem (CARDIZEM CD) 240 MG 24 hr capsule TAKE ONE CAPSULE ORALLY EVERY MORNING. 03/01/15  Yes Lendon Colonel, NP  donepezil (ARICEPT) 5 MG tablet Take 5 mg by mouth at bedtime.   Yes Historical Provider, MD  insulin glargine (LANTUS) 100 UNIT/ML injection Inject 0-30 Units into the skin 2 (two) times daily. Injects according to home sliding scale   Yes Historical Provider, MD  ipratropium-albuterol (DUONEB) 0.5-2.5 (3) MG/3ML SOLN Take 3 mLs by nebulization every 6 (six) hours as needed (dyspnea cough/wheezing).   Yes Historical Provider, MD  isosorbide mononitrate (IMDUR) 30 MG 24 hr tablet TAKE ONE TABLET BY MOUTH ONCE DAILY. 02/17/15  Yes Lendon Colonel, NP  oxybutynin (DITROPAN-XL) 10 MG 24 hr tablet Take 10 mg by  mouth at bedtime.   Yes Historical Provider, MD  tiotropium (SPIRIVA) 18 MCG inhalation capsule Place 18 mcg into inhaler and inhale daily.   Yes Historical Provider, MD  Vitamin D, Ergocalciferol, (DRISDOL) 50000 units CAPS capsule Take 50,000 Units by mouth every 7 (seven) days.   Yes Historical Provider, MD  ONE TOUCH ULTRA TEST test strip  10/19/13   Historical Provider, MD   Dg Chest 1 View  Result Date: 05/03/2016 CLINICAL DATA:  Fall.  Left hip fracture. EXAM: CHEST 1 VIEW COMPARISON:  07/06/2015 FINDINGS: Prior CABG. Mild elevation of the right hemidiaphragm with right base atelectasis or scarring. Scarring throughout the mid and upper right lung. Heart is normal size. Left lung is clear. No effusions or acute bony abnormality. IMPRESSION: Areas of scarring  in the right lung. Right basilar scarring or atelectasis. Electronically Signed   By: Rolm Baptise M.D.   On: 05/03/2016 13:19   Dg Hip Unilat With Pelvis 2-3 Views Left  Result Date: 05/03/2016 CLINICAL DATA:  Fall backwards.  Left hip pain. EXAM: DG HIP (WITH OR WITHOUT PELVIS) 2-3V LEFT COMPARISON:  None. FINDINGS: There is a left femoral intertrochanteric fracture. Mild varus angulation. No subluxation or dislocation. Penile implant noted. Surgical clips along the pelvic floor. IMPRESSION: Left femoral intertrochanteric fracture with varus angulation. Electronically Signed   By: Rolm Baptise M.D.   On: 05/03/2016 13:16    Positive ROS: All other systems have been reviewed and were otherwise negative with the exception of those mentioned in the HPI and as above.  Labs cbc  Recent Labs  05/03/16 1342 05/04/16 0318  WBC 10.0 10.6*  HGB 13.2 12.0*  HCT 39.8 37.4*  PLT 209 169    Labs inflam No results for input(s): CRP in the last 72 hours.  Invalid input(s): ESR  Labs coag  Recent Labs  05/03/16 1342  INR 1.12     Recent Labs  05/03/16 1342 05/04/16 0318  NA 141 141  K 4.1 4.3  CL 108 107  CO2 26 26  GLUCOSE 108* 140*  BUN 20 19  CREATININE 1.15 1.11  CALCIUM 9.5 9.2    Physical Exam: Vitals:   05/03/16 2214 05/04/16 0429  BP: (!) 119/54 111/60  Pulse: 76 70  Resp:    Temp: 98.4 F (36.9 C) 98.4 F (36.9 C)   General: Alert, no acute distress Cardiovascular: No pedal edema Respiratory: No cyanosis, no use of accessory musculature GI: No organomegaly, abdomen is soft and non-tender Skin: No lesions in the area of chief complaint Neurologic: Sensation intact distally Psychiatric: Patient is competent for consent with normal mood and affect Lymphatic: No axillary or cervical lymphadenopathy  MUSCULOSKELETAL:  Marked ttp lateral aspect of hip.  Positive log roll.  LLE seems to be a bit shortened and externally rotated.  No ecchymosis and no tenting of  skin.  EHL/FHL intact.  Neurovascularly intact distally     Other extremities are atraumatic with painless ROM and NVI.  Assessment: Left hip intertrochanteric fracture   Plan: Left hip IM nail this am  Weight Bearing Status: WBAT LLE post-op   PT-post-op  VTE px: SCD's and likely resume ASA 325 post-op   Fannie Knee, PA-C Cell [phone]   05/04/2016 7:53 AM

## 2016-05-04 NOTE — Op Note (Signed)
DATE OF SURGERY:  05/04/2016  TIME: 10:56 AM  PATIENT NAME:  Thomas [name]  AGE: 83 y.o.  PRE-OPERATIVE DIAGNOSIS:  intertrochantric fracture left  POST-OPERATIVE DIAGNOSIS:  SAME  PROCEDURE:  INTRAMEDULLARY (IM) NAIL INTERTROCHANTRIC  SURGEON:  Taronda Comacho D  ASSISTANT:  Doran Stabler, PA-C, he was present and scrubbed throughout the case, critical for completion in a timely fashion, and for retraction, instrumentation, and closure.   OPERATIVE IMPLANTS: Stryker Gamma Nail  PREOPERATIVE INDICATIONS:  [name] is a 83 y.o. year old who fell and suffered a hip fracture. He was brought into the ER and then admitted and optimized and then elected for surgical intervention.    The risks benefits and alternatives were discussed with the patient including but not limited to the risks of nonoperative treatment, versus surgical intervention including infection, bleeding, nerve injury, malunion, nonunion, hardware prominence, hardware failure, need for hardware removal, blood clots, cardiopulmonary complications, morbidity, mortality, among others, and they were willing to proceed.    OPERATIVE PROCEDURE:  The patient was brought to the operating room and placed in the supine position. General anesthesia was administered. He was placed on the fracture table.  Closed reduction was performed under C-arm guidance. Time out was then performed after sterile prep and drape. He received preoperative antibiotics.  Incision was made proximal to the greater trochanter. A guidewire was placed in the appropriate position. Confirmation was made on AP and lateral views. The above-named nail was opened. I opened the proximal femur with a reamer. I then placed the nail by hand easily down. I did not need to ream the femur.  Once the nail was completely seated, I placed a guidepin into the femoral head into the center center position. I measured the length, and then reamed the lateral cortex and  up into the head. I then placed the lag screw. Slight compression was applied. Anatomic fixation achieved. Bone quality was mediocre.  I then secured the proximal interlocking bolt, and took off a half a turn, and then removed the instruments, and took final C-arm pictures AP and lateral the entire length of the leg.   Anatomic reconstruction was achieved, and the wounds were irrigated copiously and closed with Vicryl followed by staples and sterile gauze for the skin. The patient was awakened and returned to PACU in stable and satisfactory condition. There no complications and the patient tolerated the procedure well.  He will be weightbearing as tolerated, and will be on chemical px  for a period of four weeks after discharge.   [name], M.D.

## 2016-05-04 NOTE — Evaluation (Signed)
Physical Therapy Evaluation Patient Details Name: Thomas [name] MRN: [id number] DOB: 06/30/1927 Today's Date: 05/04/2016   History of Present Illness  Pt is an 83 yo male admitted via ED from [facility] on 05/03/16 following a fall resulting in a left hip fx. Pt underwent an IM nailing on 05/04/16. PMH significant for COPD, DM2, HTN.   Clinical Impression  Pt is POD 0 and limited by pain this session. Prior to admission, pt was living in an ALF and mostly independent with mobility. Pt's wife recently passed away. Pt would like to return to his home and is insistent that that is where he is going when he leaves the hospital. Pt only able to perform bed mobility this session to EOB with Mod A required to assist LE's into and out of bed. Pt refused standing or short distance gait due to increased pain in left hip. Pt will benefit from continued acute PT services in order to address the below deficits prior to discharge to venue recommended below.     Follow Up Recommendations SNF;Supervision/Assistance - 24 hour    Equipment Recommendations  None recommended by PT    Recommendations for Other Services       Precautions / Restrictions Precautions Precautions: Fall Precaution Comments: some cognitive deficits noted.  Restrictions Weight Bearing Restrictions: Yes LLE Weight Bearing: Weight bearing as tolerated      Mobility  Bed Mobility Overal bed mobility: Needs Assistance Bed Mobility: Supine to Sit;Sit to Supine     Supine to sit: Mod assist;HOB elevated Sit to supine: Mod assist   General bed mobility comments: Mod A to bring LE's EOB and use of pad to scoot hips to EOB. Mod A to bring LE's into bed and to position up in bed.   Transfers                 General transfer comment: Did not attempt due to pain and pt refusal  Ambulation/Gait                Stairs            Wheelchair Mobility    Modified Rankin (Stroke Patients Only)        Balance Overall balance assessment: Needs assistance Sitting-balance support: No upper extremity supported;Feet supported Sitting balance-Leahy Scale: Fair Sitting balance - Comments: Sitting EOB. Able to sit without c/o pain.                                      Pertinent Vitals/Pain Pain Assessment: 0-10 Pain Score: 10-Worst pain ever Pain Location: Left hip with mobility Pain Descriptors / Indicators: Grimacing;Guarding;Aching;Sore;Sharp Pain Intervention(s): Monitored during session;Repositioned;Limited activity within patient's tolerance;Patient requesting pain meds-RN notified    Home Living Family/patient expects to be discharged to:: Assisted living               Home Equipment: None Additional Comments: living in ALF recieving assistance PRN and getting 3 meals a day.     Prior Function Level of Independence: Independent         Comments: Was able to ambulate w/o AD prior to fall     Hand Dominance   Dominant Hand: Right    Extremity/Trunk Assessment   Upper Extremity Assessment Upper Extremity Assessment: Defer to OT evaluation    Lower Extremity Assessment Lower Extremity Assessment: LLE deficits/detail LLE Deficits / Details: Pt with post op  pain and weakness. At least 3/5 ankle and 2/5 knee and hip per gross functional assessment.  LLE: Unable to fully assess due to pain       Communication   Communication: HOH  Cognition Arousal/Alertness: Awake/alert Behavior During Therapy: WFL for tasks assessed/performed Overall Cognitive Status: No family/caregiver present to determine baseline cognitive functioning                                 General Comments: Pt tends to perseverate on multiple topics this session including his watch missing and not being able to go to his wife's funeral.       General Comments      Exercises     Assessment/Plan    PT Assessment Patient needs continued PT services  PT  Problem List Decreased strength;Decreased range of motion;Decreased activity tolerance;Decreased balance;Decreased mobility;Decreased knowledge of use of DME;Decreased cognition;Pain       PT Treatment Interventions DME instruction;Gait training;Functional mobility training;Therapeutic activities;Therapeutic exercise;Balance training;Patient/family education    PT Goals (Current goals can be found in the Care Plan section)  Acute Rehab PT Goals Patient Stated Goal: to go home  PT Goal Formulation: With patient Time For Goal Achievement: 05/11/16 Potential to Achieve Goals: Good    Frequency Min 3X/week   Barriers to discharge        Co-evaluation               End of Session   Activity Tolerance: Patient limited by pain Patient left: in bed;with call bell/phone within reach Nurse Communication: Patient requests pain meds;Mobility status PT Visit Diagnosis: History of falling (Z91.81);Difficulty in walking, not elsewhere classified (R26.2);Pain Pain - Right/Left: Left Pain - part of body: Hip    Time: 6720-9470 PT Time Calculation (min) (ACUTE ONLY): 34 min   Charges:   PT Evaluation $PT Eval Moderate Complexity: 1 Procedure PT Treatments $Therapeutic Activity: 8-22 mins   PT G Codes:        Scheryl Marten PT, DPT  [phone]   Thomas [name] 05/04/2016, 3:17 PM

## 2016-05-04 NOTE — Progress Notes (Signed)
Initial Nutrition Assessment  DOCUMENTATION CODES:   Severe malnutrition in context of chronic illness  INTERVENTION:  -Ensure Enlive po BID, each supplement provides 350 kcal and 20 grams of protein -If po intake inadequate on follow-up, recommend liberalizing diet  NUTRITION DIAGNOSIS:   Malnutrition (Severe) related to chronic illness as evidenced by severe depletion of muscle mass, severe depletion of body fat.  GOAL:   Patient will meet greater than or equal to 90% of their needs  MONITOR:   PO intake, Supplement acceptance, Labs, Weight trends  REASON FOR ASSESSMENT:   Consult Assessment of nutrition requirement/status  ASSESSMENT:   83 yo male admitted after mechanical fall with left femoral intertrochanteric femur fracture with plan for left hip IM nail 3/31 ; hx of HTN, DM, lung cancer s/p resection, COPD, CAD  Pt very preoccupied on visit today, looking for watch and wanting writer to help him find his watch. RN reports that this has been his focus all day long. Pt reports appetite has been good, eating 3 meals per day prior to admission. Pt also reports no changes in weight. Pt reports he ate good lunch today, no recorded po intake  Nutrition-Focused physical exam completed. Findings are mild/moderate to severe fat depletion, mild/moderate to severe muscle depletion, and no edema.    Labs: CBGs 119-179 Meds: ss novolog, lantus  Diet Order:  Diet Carb Modified Fluid consistency: Thin; Room service appropriate? Yes  Skin:  Reviewed, no issues  Last BM:  05/02/16  Height:   Ht Readings from Last 1 Encounters:  05/03/16 '5\' 11"'$  (1.803 m)    Weight:   Wt Readings from Last 1 Encounters:  05/03/16 150 lb (68 kg)   BMI:  Body mass index is 20.92 kg/m.  Estimated Nutritional Needs:   Kcal:  1800-2000 kcals  Protein:  90-100 g  Fluid:  >/= 1.8 L  EDUCATION NEEDS:   No education needs identified at this time  Terry, [place], [place] [phone]  Pager  [phone] Weekend/On-Call Pager

## 2016-05-04 NOTE — Progress Notes (Signed)
PROGRESS NOTE  Thomas [name]  PQZ:[id number] DOB: 1927-06-04 DOA: 05/03/2016 PCP: Glo Herring, MD   Brief Narrative: Thomas [name] is a 83 y.o. male with a history of DM, HTN, lung CA s/p resection, COPD, anxiety, and CAD who presented to [facility] ED 3/30 with left hip pain following a fall. He reports falling onto the left side when a chair slipped from under him, with immediate moderate-severe, constant left hip pain. XR of the left hip revealed a left femoral intertrochanteric femur fracture with varus angulation. Orthopedics, Dr. Aline Brochure was consulted and felt that the patient was high medical risk for operation at [facility], so the patient was transferred to [facility] and Dr. Percell Miller placed IM nail 3/31.  Assessment & Plan: Active Problems:   COPD (chronic obstructive pulmonary disease) (HCC)   Nondisplaced intertrochanteric fracture of left femur, initial encounter for closed fracture (HCC)   Type 2 diabetes mellitus with hyperglycemia, with long-term current use of insulin (HCC)   Type 1 diabetes mellitus with hyperlipidemia ([place])   Essential hypertension  Left femoral intertrochanteric fracture: s/p IM nail 3/31 by Dr. Fredonia Highland. Intermediate medical risk for surgery according to the revised cardiac risk index, but no additional pre-op testing was recommended or performed.  - Pain control: vicodin prn and dilaudid IV if still breakthrough pain. - DVT ppx per orthopedics: ASA '325mg'$  daily - PT/OT after surgery  Hypertension: Chronic, stable - Continue diltiazem CD  IDT2DM: Last HbA1c was 6% in June 2017.  - Update A1c - Takes lantus per sliding scale at home, and has yet remained within inpatient goal withOUT insulin. Will, therefore, only continue sensitive SSI and monitor with reinitiation of diet.  Coronary artery disease: No active chest pain.  - ECG w/RBBB unchanged from prior.  - Continue imdur,   COPD: Stable. Chest x-ray shows right basilar scarring without other  acute findings. No hypoxemia. - Continue symbicort (dulera) and spiriva  Hyperlipidemia - Continue statin  Cognitive impairment - Continue aricept  Anxiety/depression - Continue home dose celexa   DVT prophylaxis: ASA Code Status: Full Family Communication: None at bedside Disposition Plan: Therapy evaluation pending.  Consultants:   Orthopedics, Dr. Fredonia Highland  Procedures:   Left INTRAMEDULLARY (IM) NAIL INTERTROCHANTRIC 3/31 by Dr. Fredonia Highland  Antimicrobials:  None   Subjective: Pt "feels like a new man" after surgery. Hasn't work ed with PT yet, perseverates on a desire to go home at discharge. Eating ok. Pain controlled.   Objective: Vitals:   05/04/16 1130 05/04/16 1133 05/04/16 1145 05/04/16 1407  BP:  (!) 104/50  (!) 118/53  Pulse: 76  84 79  Resp: 12  13   Temp:   98.1 F (36.7 C) 98.7 F (37.1 C)  TempSrc:    Oral  SpO2: 99%  98% 94%  Weight:      Height:        Intake/Output Summary (Last 24 hours) at 05/04/16 1418 Last data filed at 05/04/16 1102  Gross per 24 hour  Intake             1800 ml  Output              150 ml  Net             1650 ml   Filed Weights   05/03/16 1225  Weight: 68 kg (150 lb)    Examination: General exam: 83 y.o. male in no distress Respiratory system: Non-labored breathing room air. Clear to auscultation  bilaterally.  Cardiovascular system: Regular rate and rhythm. No murmur, rub, or gallop. No JVD, and no pedal edema. Gastrointestinal system: Abdomen soft, non-tender, non-distended, with normoactive bowel sounds. No organomegaly or masses felt. Central nervous system: Alert. No focal neurological deficits. Extremities: Left leg with normal alignment, compartment soft. Cap refill brisk, SILT, motor 5/5.  Skin: No rashes. Midline sternotomy scar noted. Left hip dressing c/d/i Psychiatry: Judgement and insight appear mildly impaired. Mood & affect appropriate.   Data Reviewed: I have personally reviewed  following labs and imaging studies  CBC:  Recent Labs Lab 05/03/16 1342 05/04/16 0318  WBC 10.0 10.6*  NEUTROABS 8.0*  --   HGB 13.2 12.0*  HCT 39.8 37.4*  MCV 90.2 89.9  PLT 209 841   Basic Metabolic Panel:  Recent Labs Lab 05/03/16 1342 05/04/16 0318  NA 141 141  K 4.1 4.3  CL 108 107  CO2 26 26  GLUCOSE 108* 140*  BUN 20 19  CREATININE 1.15 1.11  CALCIUM 9.5 9.2   GFR: Estimated Creatinine Clearance: 45.9 mL/min (by C-G formula based on SCr of 1.11 mg/dL). Liver Function Tests: No results for input(s): AST, ALT, ALKPHOS, BILITOT, PROT, ALBUMIN in the last 168 hours. No results for input(s): LIPASE, AMYLASE in the last 168 hours. No results for input(s): AMMONIA in the last 168 hours. Coagulation Profile:  Recent Labs Lab 05/03/16 1342  INR 1.12   Cardiac Enzymes: No results for input(s): CKTOTAL, CKMB, CKMBINDEX, TROPONINI in the last 168 hours. BNP (last 3 results) No results for input(s): PROBNP in the last 8760 hours. HbA1C: No results for input(s): HGBA1C in the last 72 hours. CBG:  Recent Labs Lab 05/03/16 2233 05/04/16 0912 05/04/16 1227  GLUCAP 179* 119* 119*   Lipid Profile: No results for input(s): CHOL, HDL, LDLCALC, TRIG, CHOLHDL, LDLDIRECT in the last 72 hours. Thyroid Function Tests: No results for input(s): TSH, T4TOTAL, FREET4, T3FREE, THYROIDAB in the last 72 hours. Anemia Panel: No results for input(s): VITAMINB12, FOLATE, FERRITIN, TIBC, IRON, RETICCTPCT in the last 72 hours. Urine analysis:    Component Value Date/Time   COLORURINE YELLOW 05/03/2016 1331   APPEARANCEUR CLEAR 05/03/2016 1331   LABSPEC 1.018 05/03/2016 1331   PHURINE 6.0 05/03/2016 1331   GLUCOSEU NEGATIVE 05/03/2016 1331   HGBUR NEGATIVE 05/03/2016 1331   BILIRUBINUR NEGATIVE 05/03/2016 1331   KETONESUR NEGATIVE 05/03/2016 1331   PROTEINUR NEGATIVE 05/03/2016 1331   UROBILINOGEN 0.2 11/13/2012 1238   NITRITE NEGATIVE 05/03/2016 1331   LEUKOCYTESUR  NEGATIVE 05/03/2016 1331   Recent Results (from the past 240 hour(s))  MRSA PCR Screening     Status: None   Collection Time: 05/03/16 11:30 PM  Result Value Ref Range Status   MRSA by PCR NEGATIVE NEGATIVE Final    Comment:        The GeneXpert MRSA Assay (FDA approved for NASAL specimens only), is one component of a comprehensive MRSA colonization surveillance program. It is not intended to diagnose MRSA infection nor to guide or monitor treatment for MRSA infections.       Radiology Studies: Dg Chest 1 View  Result Date: 05/03/2016 CLINICAL DATA:  Fall.  Left hip fracture. EXAM: CHEST 1 VIEW COMPARISON:  07/06/2015 FINDINGS: Prior CABG. Mild elevation of the right hemidiaphragm with right base atelectasis or scarring. Scarring throughout the mid and upper right lung. Heart is normal size. Left lung is clear. No effusions or acute bony abnormality. IMPRESSION: Areas of scarring in the right lung. Right basilar scarring  or atelectasis. Electronically Signed   By: Rolm Baptise M.D.   On: 05/03/2016 13:19   Dg C-arm 1-60 Min  Result Date: 05/04/2016 CLINICAL DATA:  Left intramedullary nail placement within the femur. FLUOROSCOPY TIME:  35.9 seconds. Images: 3 EXAM: DG C-ARM 61-120 MIN; LEFT FEMUR 2 VIEWS COMPARISON:  May 03, 2016 FINDINGS: An intramedullary rod has been placed into the left femur with a gamma nail crossing the intertrochanteric fracture. Hardware is in good position. No dislocation identified. IMPRESSION: Left femoral fracture repair as above. Electronically Signed   By: Dorise Bullion III M.D   On: 05/04/2016 11:18   Dg Hip Unilat With Pelvis 2-3 Views Left  Result Date: 05/03/2016 CLINICAL DATA:  Fall backwards.  Left hip pain. EXAM: DG HIP (WITH OR WITHOUT PELVIS) 2-3V LEFT COMPARISON:  None. FINDINGS: There is a left femoral intertrochanteric fracture. Mild varus angulation. No subluxation or dislocation. Penile implant noted. Surgical clips along the pelvic  floor. IMPRESSION: Left femoral intertrochanteric fracture with varus angulation. Electronically Signed   By: Rolm Baptise M.D.   On: 05/03/2016 13:16   Dg Femur Min 2 Views Left  Result Date: 05/04/2016 CLINICAL DATA:  Left intramedullary nail placement within the femur. FLUOROSCOPY TIME:  35.9 seconds. Images: 3 EXAM: DG C-ARM 61-120 MIN; LEFT FEMUR 2 VIEWS COMPARISON:  May 03, 2016 FINDINGS: An intramedullary rod has been placed into the left femur with a gamma nail crossing the intertrochanteric fracture. Hardware is in good position. No dislocation identified. IMPRESSION: Left femoral fracture repair as above. Electronically Signed   By: Dorise Bullion III M.D   On: 05/04/2016 11:18    Scheduled Meds: . aspirin  325 mg Oral Daily  . atorvastatin  40 mg Oral Daily  .  ceFAZolin (ANCEF) IV  2 g Intravenous Q8H  . citalopram  10 mg Oral Daily  . diltiazem  240 mg Oral Daily  . docusate sodium  100 mg Oral BID  . donepezil  5 mg Oral QHS  . heparin  5,000 Units Subcutaneous Q8H  . insulin aspart  0-9 Units Subcutaneous TID WC  . insulin glargine  5 Units Subcutaneous QHS  . isosorbide mononitrate  30 mg Oral Daily  . mometasone-formoterol  2 puff Inhalation BID  . oxybutynin  10 mg Oral QHS  . senna  1 tablet Oral BID  . tiotropium  18 mcg Inhalation Daily   Continuous Infusions: . sodium chloride    . lactated ringers       LOS: 1 day   Time spent: 25 minutes.  Vance Gather, MD [facility] Pager [phone]  If 7PM-7AM, please contact night-coverage [email or web address] Password [email or web address] 05/04/2016, 2:18 PM

## 2016-05-05 DIAGNOSIS — E43 Unspecified severe protein-calorie malnutrition: Secondary | ICD-10-CM

## 2016-05-05 LAB — CBC
HEMATOCRIT: 32.5 % — AB (ref 39.0–52.0)
HEMOGLOBIN: 11 g/dL — AB (ref 13.0–17.0)
MCH: 30.1 pg (ref 26.0–34.0)
MCHC: 33.8 g/dL (ref 30.0–36.0)
MCV: 88.8 fL (ref 78.0–100.0)
Platelets: 121 10*3/uL — ABNORMAL LOW (ref 150–400)
RBC: 3.66 MIL/uL — ABNORMAL LOW (ref 4.22–5.81)
RDW: 14.6 % (ref 11.5–15.5)
WBC: 10.5 10*3/uL (ref 4.0–10.5)

## 2016-05-05 LAB — HEMOGLOBIN A1C
Hgb A1c MFr Bld: 6 % — ABNORMAL HIGH (ref 4.8–5.6)
MEAN PLASMA GLUCOSE: 126 mg/dL

## 2016-05-05 LAB — BASIC METABOLIC PANEL
ANION GAP: 10 (ref 5–15)
BUN: 23 mg/dL — ABNORMAL HIGH (ref 6–20)
CHLORIDE: 105 mmol/L (ref 101–111)
CO2: 20 mmol/L — AB (ref 22–32)
Calcium: 8.3 mg/dL — ABNORMAL LOW (ref 8.9–10.3)
Creatinine, Ser: 1.25 mg/dL — ABNORMAL HIGH (ref 0.61–1.24)
GFR calc non Af Amer: 50 mL/min — ABNORMAL LOW (ref >60)
GFR, EST AFRICAN AMERICAN: 58 mL/min — AB (ref >60)
GLUCOSE: 170 mg/dL — AB (ref 65–99)
POTASSIUM: 4.8 mmol/L (ref 3.5–5.1)
Sodium: 135 mmol/L (ref 135–145)

## 2016-05-05 LAB — GLUCOSE, CAPILLARY
GLUCOSE-CAPILLARY: 169 mg/dL — AB (ref 65–99)
GLUCOSE-CAPILLARY: 172 mg/dL — AB (ref 65–99)
GLUCOSE-CAPILLARY: 161 mg/dL — AB (ref 65–99)
Glucose-Capillary: 157 mg/dL — ABNORMAL HIGH (ref 65–99)
Glucose-Capillary: 202 mg/dL — ABNORMAL HIGH (ref 65–99)

## 2016-05-05 NOTE — Plan of Care (Signed)
Problem: Pain Managment: Goal: General experience of comfort will improve Outcome: Progressing Medicated once for pain this shift  Problem: Tissue Perfusion: Goal: Risk factors for ineffective tissue perfusion will decrease Outcome: Progressing No S/S of DVT noted, SCDs are on, on Heparin SQ  Problem: Activity: Goal: Risk for activity intolerance will decrease Outcome: Progressing Bed rest tonight  Problem: Nutrition: Goal: Adequate nutrition will be maintained Outcome: Progressing Taking Ensure very well  Problem: Bowel/Gastric: Goal: Will not experience complications related to bowel motility Outcome: Progressing No gastric issues noted

## 2016-05-05 NOTE — Progress Notes (Signed)
Physical Therapy Treatment Patient Details Name: Thomas [name] MRN: [id number] DOB: 04/20/1927 Today's Date: 05/05/2016    History of Present Illness Pt is an 83 yo male admitted via ED from [facility] on 05/03/16 following a fall resulting in a left hip fx. Pt underwent an IM nailing on 05/04/16. PMH significant for COPD, DM2, HTN.     PT Comments    Pt is POD 1 and continues to be moving well with therapy this session. Pt required increased assistance for bed mobility, but was able to stand and pivot with min A this session. Pt continues to require further acute follow-up in order to perform short distance gait.     Follow Up Recommendations  SNF;Supervision/Assistance - 24 hour     Equipment Recommendations  None recommended by PT    Recommendations for Other Services       Precautions / Restrictions Precautions Precautions: Fall Precaution Comments: some cognitive deficits noted.  Restrictions Weight Bearing Restrictions: Yes LLE Weight Bearing: Weight bearing as tolerated    Mobility  Bed Mobility Overal bed mobility: Needs Assistance Bed Mobility: Supine to Sit     Supine to sit: Mod assist;HOB elevated     General bed mobility comments: Mod A to bring LE's EOB and use of pad to scoot hips to EOB.    Transfers Overall transfer level: Needs assistance Equipment used: Rolling walker (2 wheeled) Transfers: Sit to/from [facility] Sit to Stand: Min assist Stand pivot transfers: Min assist       General transfer comment: Min A for safety from EOB to RW with cues for hand placement and Min A throughout transfer with cues for sequencing  Ambulation/Gait             General Gait Details: Attempted to perform gait short distance in room, pt refused   Stairs            Wheelchair Mobility    Modified Rankin (Stroke Patients Only)       Balance Overall balance assessment: Needs assistance Sitting-balance support: No upper  extremity supported;Feet supported Sitting balance-Leahy Scale: Fair Sitting balance - Comments: Sitting EOB. Able to sit without c/o pain.    Standing balance support: Bilateral upper extremity supported;During functional activity Standing balance-Leahy Scale: Poor Standing balance comment: Relies on RW for stability in standing                            Cognition Arousal/Alertness: Awake/alert Behavior During Therapy: WFL for tasks assessed/performed Overall Cognitive Status: No family/caregiver present to determine baseline cognitive functioning                                 General Comments: pt with improved cognition, but definite deficits noted      Exercises      General Comments        Pertinent Vitals/Pain Pain Assessment: Faces Faces Pain Scale: Hurts whole lot Pain Location: Left hip with mobility Pain Descriptors / Indicators: Grimacing;Guarding;Aching;Sore;Sharp Pain Intervention(s): Monitored during session;Premedicated before session;Repositioned;Ice applied    Home Living                      Prior Function            PT Goals (current goals can now be found in the care plan section) Acute Rehab PT Goals Patient Stated Goal:  to go home     Frequency    Min 3X/week      PT Plan Current plan remains appropriate    Co-evaluation             End of Session Equipment Utilized During Treatment: Gait belt Activity Tolerance: Patient tolerated treatment well Patient left: in chair;with call bell/phone within reach Nurse Communication: Mobility status PT Visit Diagnosis: History of falling (Z91.81);Difficulty in walking, not elsewhere classified (R26.2);Pain Pain - Right/Left: Left Pain - part of body: Hip     Time: 0962-8366 PT Time Calculation (min) (ACUTE ONLY): 12 min  Charges:  $Therapeutic Activity: 8-22 mins                    G Codes:       Scheryl Marten PT, DPT  [phone]    Jacqulyn Liner  Sloan Leiter 05/05/2016, 12:37 PM

## 2016-05-05 NOTE — Progress Notes (Signed)
Subjective: 1 Day Post-Op Procedure(s) (LRB): INTRAMEDULLARY (IM) NAIL INTERTROCHANTRIC (Left) Patient reports pain as mild.    Objective: Vital signs in last 24 hours: Temp:  [97.2 F (36.2 C)-99.3 F (37.4 C)] 98.4 F (36.9 C) (04/01 0500) Pulse Rate:  [76-90] 84 (04/01 0500) Resp:  [12-17] 13 (03/31 1145) BP: (92-118)/(39-58) 111/58 (04/01 0500) SpO2:  [90 %-99 %] 97 % (04/01 0802)  Intake/Output from previous day: 03/31 0701 - 04/01 0700 In: 1040 [P.O.:240; I.V.:800] Out: 150 [Blood:150] Intake/Output this shift: No intake/output data recorded.   Recent Labs  05/03/16 1342 05/04/16 0318 05/05/16 0725  HGB 13.2 12.0* 11.0*    Recent Labs  05/04/16 0318 05/05/16 0725  WBC 10.6* 10.5  RBC 4.16* 3.66*  HCT 37.4* 32.5*  PLT 169 121*    Recent Labs  05/04/16 0318 05/05/16 0725  NA 141 135  K 4.3 4.8  CL 107 105  CO2 26 20*  BUN 19 23*  CREATININE 1.11 1.25*  GLUCOSE 140* 170*  CALCIUM 9.2 8.3*    Recent Labs  05/03/16 1342  INR 1.12    Neurologically intact Neurovascular intact Sensation intact distally Intact pulses distally Dorsiflexion/Plantar flexion intact Incision: dressing C/D/I No cellulitis present Compartment soft  Assessment/Plan: 1 Day Post-Op Procedure(s) (LRB): INTRAMEDULLARY (IM) NAIL INTERTROCHANTRIC (Left) Up with therapy  WBAT LLE ASA for dvt ppx Dry dressing change prn Continue plan per medicine  Fannie Knee 05/05/2016, 9:41 AM

## 2016-05-05 NOTE — Progress Notes (Signed)
PROGRESS NOTE  Thomas [name]  ZRA:[id number] DOB: Aug 14, 1927 DOA: 05/03/2016 PCP: Glo Herring, MD   Brief Narrative: Thomas [name] is a 83 y.o. male with a history of DM, HTN, lung CA s/p resection, COPD, anxiety, and CAD who presented to [facility] ED 3/30 with left hip pain following a fall. He reports falling onto the left side when a chair slipped from under him, with immediate moderate-severe, constant left hip pain. XR of the left hip revealed a left femoral intertrochanteric femur fracture with varus angulation. Orthopedics, Dr. Aline Brochure was consulted and felt that the patient was high medical risk for operation at [facility], so the patient was transferred to [facility] and Dr. Percell Miller placed IM nail 3/31. Physical therapy has recommended SNF, CSW has been consulted.   Assessment & Plan: Active Problems:   COPD (chronic obstructive pulmonary disease) (HCC)   Nondisplaced intertrochanteric fracture of left femur, initial encounter for closed fracture (HCC)   Type 2 diabetes mellitus with hyperglycemia, with long-term current use of insulin (HCC)   Type 1 diabetes mellitus with hyperlipidemia (HCC)   Essential hypertension   Protein-calorie malnutrition, severe  Left femoral intertrochanteric fracture: s/p IM nail 3/31 by Dr. Fredonia Highland. Intermediate medical risk for surgery according to the revised cardiac risk index, but no additional pre-op testing was recommended or performed.  - Pain control: vicodin prn, controlled. - DVT ppx per orthopedics: ASA '325mg'$  daily - PT recommends SNF, CSW consulted. WBAT LLE per ortho.  Hypertension: Chronic, stable - Continue diltiazem CD  IDT2DM: HbA1c remains 6%.  - At inpatient goal on sensitive SSI alone.   Coronary artery disease: No active chest pain. Low risk lexiscan 2015.  - ECG w/RBBB unchanged from prior.  - Continue imdur. Beta blocker was discontinued by cardiology with pt on diltiazem.  Severe protein-calorie malnutrition: In  setting of acute and chronic illness.  - Nutrition consult, protein supplement.   COPD: Stable. Chest x-ray shows right basilar scarring without other acute findings. No hypoxemia. - Continue symbicort (dulera) and spiriva - On oxygen without documented hypoxia, will wean to room air. Lung exam reassuring.   Lung CA s/p resection: Stable  Hyperlipidemia - Continue statin  Cognitive impairment - Continue aricept  Anxiety/depression - Continue home dose celexa   DVT prophylaxis: ASA per orthopedics Code Status: Full Family Communication: None at bedside Disposition Plan: Medically stable for SNF.   Consultants:   Orthopedics, Dr. Fredonia Highland  Procedures:   Left INTRAMEDULLARY (IM) NAIL INTERTROCHANTRIC 3/31 by Dr. Fredonia Highland  Antimicrobials:  None   Subjective: Pt reports pain is very mild, needed 1 dose of prn medications. Ready to go home but understands need for SNF. Eating ok.   Objective: Vitals:   05/04/16 1407 05/04/16 2150 05/05/16 0500 05/05/16 0802  BP: (!) 118/53 (!) 108/39 (!) 111/58   Pulse: 79 90 84   Resp:      Temp: 98.7 F (37.1 C) 99.3 F (37.4 C) 98.4 F (36.9 C)   TempSrc: Oral Oral Oral   SpO2: 94% 90% 97% 97%  Weight:      Height:        Intake/Output Summary (Last 24 hours) at 05/05/16 1240 Last data filed at 05/05/16 1100  Gross per 24 hour  Intake              480 ml  Output                0 ml  Net  480 ml   Filed Weights   05/03/16 1225  Weight: 68 kg (150 lb)    Examination: General exam: 83 y.o. male in no distress Respiratory system: Non-labored breathing room air. Clear to auscultation bilaterally.  Cardiovascular system: Regular rate and rhythm. No murmur, rub, or gallop. No JVD, and no pedal edema. Gastrointestinal system: Abdomen soft, non-tender, non-distended, with normoactive bowel sounds. No organomegaly or masses felt. Central nervous system: Alert. No focal neurological deficits. Extremities:  Left leg with normal alignment, compartment soft. Cap refill brisk, SILT, motor 5/5.  Skin: No rashes. Midline sternotomy scar noted. Left hip dressing c/d/i Psychiatry: Judgement and insight appear mildly impaired. Mood & affect appropriate.   Data Reviewed: I have personally reviewed following labs and imaging studies  CBC:  Recent Labs Lab 05/03/16 1342 05/04/16 0318 05/05/16 0725  WBC 10.0 10.6* 10.5  NEUTROABS 8.0*  --   --   HGB 13.2 12.0* 11.0*  HCT 39.8 37.4* 32.5*  MCV 90.2 89.9 88.8  PLT 209 169 694*   Basic Metabolic Panel:  Recent Labs Lab 05/03/16 1342 05/04/16 0318 05/05/16 0725  NA 141 141 135  K 4.1 4.3 4.8  CL 108 107 105  CO2 26 26 20*  GLUCOSE 108* 140* 170*  BUN 20 19 23*  CREATININE 1.15 1.11 1.25*  CALCIUM 9.5 9.2 8.3*   GFR: Estimated Creatinine Clearance: 40.8 mL/min (A) (by C-G formula based on SCr of 1.25 mg/dL (H)). Liver Function Tests: No results for input(s): AST, ALT, ALKPHOS, BILITOT, PROT, ALBUMIN in the last 168 hours. No results for input(s): LIPASE, AMYLASE in the last 168 hours. No results for input(s): AMMONIA in the last 168 hours. Coagulation Profile:  Recent Labs Lab 05/03/16 1342  INR 1.12   Cardiac Enzymes: No results for input(s): CKTOTAL, CKMB, CKMBINDEX, TROPONINI in the last 168 hours. BNP (last 3 results) No results for input(s): PROBNP in the last 8760 hours. HbA1C:  Recent Labs  05/04/16 0318  HGBA1C 6.0*   CBG:  Recent Labs Lab 05/04/16 1800 05/04/16 2157 05/05/16 0628 05/05/16 0641 05/05/16 1041  GLUCAP 130* 145* 169* 172* 157*   Lipid Profile: No results for input(s): CHOL, HDL, LDLCALC, TRIG, CHOLHDL, LDLDIRECT in the last 72 hours. Thyroid Function Tests: No results for input(s): TSH, T4TOTAL, FREET4, T3FREE, THYROIDAB in the last 72 hours. Anemia Panel: No results for input(s): VITAMINB12, FOLATE, FERRITIN, TIBC, IRON, RETICCTPCT in the last 72 hours. Urine analysis:    Component  Value Date/Time   COLORURINE YELLOW 05/03/2016 1331   APPEARANCEUR CLEAR 05/03/2016 1331   LABSPEC 1.018 05/03/2016 1331   PHURINE 6.0 05/03/2016 1331   GLUCOSEU NEGATIVE 05/03/2016 1331   HGBUR NEGATIVE 05/03/2016 1331   BILIRUBINUR NEGATIVE 05/03/2016 1331   KETONESUR NEGATIVE 05/03/2016 1331   PROTEINUR NEGATIVE 05/03/2016 1331   UROBILINOGEN 0.2 11/13/2012 1238   NITRITE NEGATIVE 05/03/2016 1331   LEUKOCYTESUR NEGATIVE 05/03/2016 1331   Recent Results (from the past 240 hour(s))  MRSA PCR Screening     Status: None   Collection Time: 05/03/16 11:30 PM  Result Value Ref Range Status   MRSA by PCR NEGATIVE NEGATIVE Final    Comment:        The GeneXpert MRSA Assay (FDA approved for NASAL specimens only), is one component of a comprehensive MRSA colonization surveillance program. It is not intended to diagnose MRSA infection nor to guide or monitor treatment for MRSA infections.       Radiology Studies: Dg Chest 1 View  Result Date: 05/03/2016 CLINICAL DATA:  Fall.  Left hip fracture. EXAM: CHEST 1 VIEW COMPARISON:  07/06/2015 FINDINGS: Prior CABG. Mild elevation of the right hemidiaphragm with right base atelectasis or scarring. Scarring throughout the mid and upper right lung. Heart is normal size. Left lung is clear. No effusions or acute bony abnormality. IMPRESSION: Areas of scarring in the right lung. Right basilar scarring or atelectasis. Electronically Signed   By: Rolm Baptise M.D.   On: 05/03/2016 13:19   Dg C-arm 1-60 Min  Result Date: 05/04/2016 CLINICAL DATA:  Left intramedullary nail placement within the femur. FLUOROSCOPY TIME:  35.9 seconds. Images: 3 EXAM: DG C-ARM 61-120 MIN; LEFT FEMUR 2 VIEWS COMPARISON:  May 03, 2016 FINDINGS: An intramedullary rod has been placed into the left femur with a gamma nail crossing the intertrochanteric fracture. Hardware is in good position. No dislocation identified. IMPRESSION: Left femoral fracture repair as above.  Electronically Signed   By: Dorise Bullion III M.D   On: 05/04/2016 11:18   Dg Hip Unilat With Pelvis 2-3 Views Left  Result Date: 05/03/2016 CLINICAL DATA:  Fall backwards.  Left hip pain. EXAM: DG HIP (WITH OR WITHOUT PELVIS) 2-3V LEFT COMPARISON:  None. FINDINGS: There is a left femoral intertrochanteric fracture. Mild varus angulation. No subluxation or dislocation. Penile implant noted. Surgical clips along the pelvic floor. IMPRESSION: Left femoral intertrochanteric fracture with varus angulation. Electronically Signed   By: Rolm Baptise M.D.   On: 05/03/2016 13:16   Dg Femur Min 2 Views Left  Result Date: 05/04/2016 CLINICAL DATA:  Left intramedullary nail placement within the femur. FLUOROSCOPY TIME:  35.9 seconds. Images: 3 EXAM: DG C-ARM 61-120 MIN; LEFT FEMUR 2 VIEWS COMPARISON:  May 03, 2016 FINDINGS: An intramedullary rod has been placed into the left femur with a gamma nail crossing the intertrochanteric fracture. Hardware is in good position. No dislocation identified. IMPRESSION: Left femoral fracture repair as above. Electronically Signed   By: Dorise Bullion III M.D   On: 05/04/2016 11:18    Scheduled Meds: . aspirin  325 mg Oral Daily  . atorvastatin  40 mg Oral Daily  .  ceFAZolin (ANCEF) IV  2 g Intravenous Q8H  . citalopram  10 mg Oral Daily  . diltiazem  240 mg Oral Daily  . docusate sodium  100 mg Oral BID  . donepezil  5 mg Oral QHS  . feeding supplement (ENSURE ENLIVE)  237 mL Oral BID BM  . heparin  5,000 Units Subcutaneous Q8H  . insulin aspart  0-9 Units Subcutaneous TID WC  . isosorbide mononitrate  30 mg Oral Daily  . mometasone-formoterol  2 puff Inhalation BID  . oxybutynin  10 mg Oral QHS  . senna  1 tablet Oral BID  . tiotropium  18 mcg Inhalation Daily   Continuous Infusions: . sodium chloride 50 mL/hr at 05/05/16 0914  . lactated ringers       LOS: 2 days   Time spent: 25 minutes.  Vance Gather, MD [facility] Pager  [phone]  If 7PM-7AM, please contact night-coverage [email or web address] Password [facility] 05/05/2016

## 2016-05-06 ENCOUNTER — Encounter (HOSPITAL_COMMUNITY): Payer: Self-pay | Admitting: Orthopedic Surgery

## 2016-05-06 LAB — HEMOGLOBIN A1C
Hgb A1c MFr Bld: 5.8 % — ABNORMAL HIGH (ref 4.8–5.6)
MEAN PLASMA GLUCOSE: 120 mg/dL

## 2016-05-06 LAB — GLUCOSE, CAPILLARY
Glucose-Capillary: 160 mg/dL — ABNORMAL HIGH (ref 65–99)
Glucose-Capillary: 170 mg/dL — ABNORMAL HIGH (ref 65–99)

## 2016-05-06 MED ORDER — INSULIN ASPART 100 UNIT/ML ~~LOC~~ SOLN: SUBCUTANEOUS | 11 refills | Status: DC

## 2016-05-06 MED ORDER — SENNA 8.6 MG PO TABS: 1.0000 | ORAL_TABLET | Freq: Two times a day (BID) | ORAL | 0 refills | Status: AC | PRN

## 2016-05-06 MED ORDER — OMEPRAZOLE 20 MG PO CPDR: 20.0000 mg | DELAYED_RELEASE_CAPSULE | Freq: Every day | ORAL | 0 refills | Status: DC

## 2016-05-06 MED ORDER — HYDROCODONE-ACETAMINOPHEN 5-325 MG PO TABS: 1.0000 | ORAL_TABLET | Freq: Four times a day (QID) | ORAL | 0 refills | Status: DC | PRN

## 2016-05-06 NOTE — Progress Notes (Signed)
   Assessment: 2 Days Post-Op  S/P Procedure(s) (LRB): INTRAMEDULLARY (IM) NAIL INTERTROCHANTRIC (Left) by Dr. Ernesta Amble. Percell Miller on 05/04/16  Active Problems:   COPD (chronic obstructive pulmonary disease) (HCC)   Nondisplaced intertrochanteric fracture of left femur, initial encounter for closed fracture (HCC)   Type 2 diabetes mellitus with hyperglycemia, with long-term current use of insulin (HCC)   Type 1 diabetes mellitus with hyperlipidemia (HCC)   Essential hypertension   Protein-calorie malnutrition, severe   Plan: Up with therapy Incentive Spirometry  Continue plan per medicine  Weight Bearing: Weight Bearing as Tolerated (WBAT) left leg Dressings: Change Mepilex prn.  VTE prophylaxis: Aspirin, SCDs, ambulation Dispo: Per Primary.  Skilled Nursing Facility  Subjective: Patient reports pain as mild. Pain controlled with PO meds.  Mobilizing w/ PT.  Objective:   VITALS:   Vitals:   05/05/16 0500 05/05/16 0802 05/05/16 2141 05/06/16 0440  BP: (!) 111/58  (!) 126/45 (!) 118/43  Pulse: 84  70 76  Resp:   16 16  Temp: 98.4 F (36.9 C)  98.2 F (36.8 C) 99.3 F (37.4 C)  TempSrc: Oral  Oral Oral  SpO2: 97% 97% 91% 91%  Weight:      Height:       CBC Latest Ref Rng & Units 05/05/2016 05/04/2016 05/03/2016  WBC 4.0 - 10.5 K/uL 10.5 10.6(H) 10.0  Hemoglobin 13.0 - 17.0 g/dL 11.0(L) 12.0(L) 13.2  Hematocrit 39.0 - 52.0 % 32.5(L) 37.4(L) 39.8  Platelets 150 - 400 K/uL 121(L) 169 209   BMP Latest Ref Rng & Units 05/05/2016 05/04/2016 05/03/2016  Glucose 65 - 99 mg/dL 170(H) 140(H) 108(H)  BUN 6 - 20 mg/dL 23(H) 19 20  Creatinine 0.61 - 1.24 mg/dL 1.25(H) 1.11 1.15  Sodium 135 - 145 mmol/L 135 141 141  Potassium 3.5 - 5.1 mmol/L 4.8 4.3 4.1  Chloride 101 - 111 mmol/L 105 107 108  CO2 22 - 32 mmol/L 20(L) 26 26  Calcium 8.9 - 10.3 mg/dL 8.3(L) 9.2 9.5   Intake/Output      04/01 0701 - 04/02 0700 04/02 0701 - 04/03 0700   P.O. 240    I.V. (mL/kg) 438.3 (6.4)    IV  Piggyback 400    Total Intake(mL/kg) 1078.3 (15.9)    Urine (mL/kg/hr) 1100 (0.7)    Blood     Total Output 1100     Net -21.7            Physical Exam: General: NAD.  Supine in bed.  Talkative.  Word finding difficult at times.  No increased wob MSK Neurovascularly intact Sensation intact distally Feet warm Dorsiflexion/Plantar flexion intact Incision: dressing C/D/I  Prudencio Burly III, PA-C 05/06/2016, 7:53 AM

## 2016-05-06 NOTE — Clinical Social Work Note (Signed)
Clinical Social Work Assessment  Patient Details  Name: Thomas [name] MRN: [id number] Date of Birth: [birth year]  Date of referral:  05/06/16               Reason for consult:  Facility Placement                Permission sought to share information with:    Permission granted to share information::     Name::        Agency::     Relationship::     Contact Information:     Housing/Transportation Living arrangements for the past 2 months:  [place] of Information:   Theme park manager Guardian) Patient Interpreter Needed:  None Criminal Activity/Legal Involvement Pertinent to Current Situation/Hospitalization:  No - Comment as needed Significant Relationships:  None Lives with:  Facility Resident Do you feel safe going back to the place where you live?    Need for family participation in patient care:     Care giving concerns:  Pt is disoriented. [employer] worker Netta Cedars is pt's legal guardian.  Social Worker assessment / plan:  CSW spoke with [employer], pt's legal Guardian. Pt is from [facility]. Plan is for pt to return to [facility] at d/c. CSW will follow up with facility. CSW will set up [facility] at d/c.  Employment status:  Retired Nurse, adult PT Recommendations:  [place] / Referral to community resources:  [place]  Patient/Family's Response to care:  Pt's legal guardian verbalized understanding of CSW role and expressed appreciation for support. Pt's legal guardian denies any concern regarding pt care at this time.   Patient/Family's Understanding of and Emotional Response to Diagnosis, Current Treatment, and Prognosis:  Pt's legal guardian understanding and realistic regarding physical limitations. Pt's legal guardian understands the need for SNF placement at d/c. Pt's legal guardian agreeable to SNF placement at d/c, at this time. Pt's legal guardian denies any  concern regarding treatment plan at this time. CSW will continue to provide support and facilitate d/c needs.   Emotional Assessment Appearance:  Appears stated age Attitude/Demeanor/Rapport:  Unable to Assess Affect (typically observed):  Unable to Assess Orientation:  Oriented to Self Alcohol / Substance use:  Not Applicable Psych involvement (Current and /or in the community):  No (Comment)  Discharge Needs  Concerns to be addressed:    Readmission within the last 30 days:  No Current discharge risk:  Dependent with Mobility Barriers to Discharge:  Continued Medical Work up   [employer], LCSW 05/06/2016, 10:41 AM

## 2016-05-06 NOTE — Clinical Social Work Note (Signed)
Clinical Social Worker facilitated patient discharge including contacting patient family and facility to confirm patient discharge plans.  Clinical information faxed to facility and family agreeable with plan.  CSW arranged ambulance transport via [facility] to [facility].  RN to call [phone] for report prior to discharge. Patient will go to room 602 B.  Clinical Social Worker will sign off for now as social work intervention is no longer needed. Please consult [place] again if new need arises.  [address] [place]

## 2016-05-06 NOTE — Discharge Summary (Signed)
Physician Discharge Summary  DONLEY HARLAND LYY:[id number] DOB: [birth year] DOA: 05/03/2016  PCP: Glo Herring, MD  Admit date: 05/03/2016 Discharge date: 05/06/2016  Admitted From: SNF Disposition: SNF   Recommendations for Outpatient Follow-up:  1. Follow up with PCP in 1-2 weeks 2. Follow up with Dr. Fredonia Highland, orthopedics, in 2 weeks. 3. Per orthopedics:  Weight Bearing: Weight Bearing as Tolerated (WBAT) left leg Dressings: Change Mepilex prn.  VTE prophylaxis: Aspirin, SCDs, ambulation Pain Control: Norco prn 4. Please obtain BMP/CBC in one week 5. Please follow up on the following pending results:  Home Health: N/A Equipment/Devices: None Discharge Condition: Stable CODE STATUS: Full Diet recommendation: Heart healthy, carbohydrate limited  Brief/Interim Summary: Thomas [name] is a 83 y.o. male with a history of DM, HTN, lung CA s/p resection, COPD, anxiety, and CAD who presented to [facility] ED 3/30 with left hip pain following a fall. He reports falling onto the left side when a chair slipped from under him, with immediate moderate-severe, constant left hip pain. XR of the left hip revealed a left femoral intertrochanteric femur fracture with varus angulation. Orthopedics, Dr. Aline Brochure was consulted and felt that the patient was high medical risk for operation at [facility], so the patient was transferred to [facility] and Dr. Percell Miller placed IM nail 3/31. Physical therapy has recommended SNF, CSW has been consulted.   Discharge Diagnoses:  Active Problems:   COPD (chronic obstructive pulmonary disease) (HCC)   Nondisplaced intertrochanteric fracture of left femur, initial encounter for closed fracture (HCC)   Type 2 diabetes mellitus with hyperglycemia, with long-term current use of insulin (HCC)   Type 1 diabetes mellitus with hyperlipidemia (HCC)   Essential hypertension   Protein-calorie malnutrition, severe  Left femoral intertrochanteric fracture: s/p IM nail 3/31 by Dr. Fredonia Highland. Intermediate medical risk for surgery according to the revised cardiac risk index, but no additional pre-op testing was recommended or performed.  - Pain control: vicodin prn, controlled. - DVT ppx per orthopedics: ASA '325mg'$  daily - Disposition to SNF, though pt would prefer discharge home this would not be a safe discharge.  - WBAT LLE per ortho.  Hypertension: Chronic, stable - Continue diltiazem CD  IDT2DM: HbA1c remains 6%.  - At inpatient goal on sensitive SSI alone. Given low A1c, will DC lantus and advise sliding scale insulin as below.  - Consider discontinuing insulin in the future   Coronary artery disease: No active chest pain. Low risk lexiscan 2015.  - ECG w/RBBB unchanged from prior.  - Continue imdur. Beta blocker was discontinued years ago by cardiology with pt on diltiazem.  Severe protein-calorie malnutrition: In setting of acute and chronic illness.  - Nutrition consult, protein supplement.   COPD: Stable. Chest x-ray shows right basilar scarring without other acute findings. No hypoxemia. - Continue symbicort (dulera on formulary) and spiriva  Lung CA s/p resection: Stable  Hyperlipidemia - Continue statin  Cognitive impairment - Continue aricept  Anxiety/depression - Continue home dose celexa   Discharge Instructions Discharge Instructions    Discharge instructions    Complete by:  As directed    Weight Bearing: Weight Bearing as Tolerated (WBAT) left leg Dressings: Change Mepilex prn.  VTE prophylaxis: Aspirin, SCDs, ambulation Dispo: Per PT.  [place], aspirin prescriptions written per orthopedics.     Allergies as of 05/06/2016      Reactions   No Known Allergies       Medication List    STOP taking these  medications   insulin glargine 100 UNIT/ML injection Commonly known as:  LANTUS     TAKE these medications   acetaminophen 500 MG tablet Commonly known as:  TYLENOL Take 1,000 mg by mouth every 8  (eight) hours as needed for moderate pain.   aspirin 325 MG tablet Take 325 mg by mouth daily.   atorvastatin 40 MG tablet Commonly known as:  LIPITOR Take 40 mg by mouth daily.   budesonide-formoterol 160-4.5 MCG/ACT inhaler Commonly known as:  SYMBICORT Inhale 2 puffs into the lungs 2 (two) times daily.   citalopram 10 MG tablet Commonly known as:  CELEXA Take 1 tablet (10 mg total) by mouth daily.   diltiazem 240 MG 24 hr capsule Commonly known as:  CARDIZEM CD TAKE ONE CAPSULE ORALLY EVERY MORNING.   donepezil 5 MG tablet Commonly known as:  ARICEPT Take 5 mg by mouth at bedtime.   HYDROcodone-acetaminophen 5-325 MG tablet Commonly known as:  NORCO Take 1-2 tablets by mouth every 6 (six) hours as needed for moderate pain.   insulin aspart 100 UNIT/ML injection Commonly known as:  novoLOG Iject 3 times daily with meals according to the instructions below: CBG < 70: implement hypoglycemia protocol CBG 70 - 120: 0 units CBG 121 - 150: 1 unit CBG 151 - 200: 2 units CBG 201 - 250: 3 units CBG 251 - 300: 5 units CBG 301 - 350: 7 units CBG 351 - 400: 9 units CBG > 400: call MD and obtain STAT lab verification   ipratropium-albuterol 0.5-2.5 (3) MG/3ML Soln Commonly known as:  DUONEB Take 3 mLs by nebulization every 6 (six) hours as needed (dyspnea cough/wheezing).   isosorbide mononitrate 30 MG 24 hr tablet Commonly known as:  IMDUR TAKE ONE TABLET BY MOUTH ONCE DAILY.   omeprazole 20 MG capsule Commonly known as:  PRILOSEC Take 1 capsule (20 mg total) by mouth daily. For gastric protection While taking anti inflammatory medicine daily   ONE TOUCH ULTRA TEST test strip Generic drug:  glucose blood   oxybutynin 10 MG 24 hr tablet Commonly known as:  DITROPAN-XL Take 10 mg by mouth at bedtime.   senna 8.6 MG Tabs tablet Commonly known as:  SENOKOT Take 1 tablet (8.6 mg total) by mouth 2 (two) times daily as needed for mild constipation.   tiotropium 18 MCG  inhalation capsule Commonly known as:  SPIRIVA Place 18 mcg into inhaler and inhale daily.   Vitamin D (Ergocalciferol) 50000 units Caps capsule Commonly known as:  DRISDOL Take 50,000 Units by mouth every 7 (seven) days.      Follow-up Information    Glo Herring, MD Follow up.   Specialty:  Internal Medicine Contact information: [address] [place] [address] [phone]        Renette Butters, MD. Schedule an appointment as soon as possible for a visit in 2 week(s).   Specialty:  Orthopedic Surgery Contact information: [address] [place] [address] [phone]          Allergies  Allergen Reactions  . No Known Allergies     Consultations:  Orthopedics, Dr. Fredonia Highland  Procedures/Studies: Dg Chest 1 View  Result Date: 05/03/2016 CLINICAL DATA:  Fall.  Left hip fracture. EXAM: CHEST 1 VIEW COMPARISON:  07/06/2015 FINDINGS: Prior CABG. Mild elevation of the right hemidiaphragm with right base atelectasis or scarring. Scarring throughout the mid and upper right lung. Heart is normal size. Left lung is clear. No effusions  or acute bony abnormality. IMPRESSION: Areas of scarring in the right lung. Right basilar scarring or atelectasis. Electronically Signed   By: Rolm Baptise M.D.   On: 05/03/2016 13:19   Ct Chest Wo Contrast  Result Date: 04/17/2016 CLINICAL DATA:  Left-sided chest pain, history of lung carcinoma EXAM: CT CHEST WITHOUT CONTRAST TECHNIQUE: Multidetector CT imaging of the chest was performed following the standard protocol without IV contrast. COMPARISON:  03/10/2013, 07/06/2015 FINDINGS: Cardiovascular: Atherosclerotic calcifications of the aorta are noted. There are changes consistent with coronary bypass grafting. Heavy coronary calcifications are seen. No significant cardiac enlargement is noted. Mediastinum/Nodes: The thoracic inlet as visualize is within normal limits. No hilar or mediastinal adenopathy is  identified. Lungs/Pleura: Diffuse emphysematous changes are seen. Postsurgical changes are noted on the right consistent with the given clinical history of thoracotomy. Volume loss is identified. No focal infiltrate or sizable effusion is noted. No parenchymal nodules are seen. Upper Abdomen: Renal cystic change is again identified and stable. No other abdominal abnormality is seen. Musculoskeletal: Postsurgical changes are noted in the right chest wall consistent with the prior surgical history. Degenerative change of the thoracic spine it is noted. Prior median sternotomy and vertebral augmentation are again seen. IMPRESSION: Postsurgical changes on the right. Chronic emphysematous changes. No abnormality to correspond with patient's given clinical history is noted. Electronically Signed   By: Inez Catalina M.D.   On: 04/17/2016 11:02   Dg C-arm 1-60 Min  Result Date: 05/04/2016 CLINICAL DATA:  Left intramedullary nail placement within the femur. FLUOROSCOPY TIME:  35.9 seconds. Images: 3 EXAM: DG C-ARM 61-120 MIN; LEFT FEMUR 2 VIEWS COMPARISON:  May 03, 2016 FINDINGS: An intramedullary rod has been placed into the left femur with a gamma nail crossing the intertrochanteric fracture. Hardware is in good position. No dislocation identified. IMPRESSION: Left femoral fracture repair as above. Electronically Signed   By: Dorise Bullion III M.D   On: 05/04/2016 11:18   Dg Hip Unilat With Pelvis 2-3 Views Left  Result Date: 05/03/2016 CLINICAL DATA:  Fall backwards.  Left hip pain. EXAM: DG HIP (WITH OR WITHOUT PELVIS) 2-3V LEFT COMPARISON:  None. FINDINGS: There is a left femoral intertrochanteric fracture. Mild varus angulation. No subluxation or dislocation. Penile implant noted. Surgical clips along the pelvic floor. IMPRESSION: Left femoral intertrochanteric fracture with varus angulation. Electronically Signed   By: Rolm Baptise M.D.   On: 05/03/2016 13:16   Dg Femur Min 2 Views Left  Result Date:  05/04/2016 CLINICAL DATA:  Left intramedullary nail placement within the femur. FLUOROSCOPY TIME:  35.9 seconds. Images: 3 EXAM: DG C-ARM 61-120 MIN; LEFT FEMUR 2 VIEWS COMPARISON:  May 03, 2016 FINDINGS: An intramedullary rod has been placed into the left femur with a gamma nail crossing the intertrochanteric fracture. Hardware is in good position. No dislocation identified. IMPRESSION: Left femoral fracture repair as above. Electronically Signed   By: Dorise Bullion III M.D   On: 05/04/2016 11:18    Left INTRAMEDULLARY (IM) NAIL INTERTROCHANTRIC 3/31 by Dr. Fredonia Highland  Subjective: Pt without events overnight, pain controlled, eating well.   Discharge Exam: Vitals:   05/05/16 2141 05/06/16 0440  BP: (!) 126/45 (!) 118/43  Pulse: 70 76  Resp: 16 16  Temp: 98.2 F (36.8 C) 99.3 F (37.4 C)   General: Elderly 83yo M in no distress.  Cardiovascular: RRR, no JVD Respiratory: Nonlabored on room air, clear bilaterally Abdominal: Soft, NT, ND, bowel sounds + MSK: Mepilex dressing covering left hip  wound which is c/d/I. LLE in normal alignment, compartment soft, toes with brisk cap refill, SILT, strength 5/5.   Labs: Basic Metabolic Panel:  Recent Labs Lab 05/03/16 1342 05/04/16 0318 05/05/16 0725  NA 141 141 135  K 4.1 4.3 4.8  CL 108 107 105  CO2 26 26 20*  GLUCOSE 108* 140* 170*  BUN 20 19 23*  CREATININE 1.15 1.11 1.25*  CALCIUM 9.5 9.2 8.3*   CBC:  Recent Labs Lab 05/03/16 1342 05/04/16 0318 05/05/16 0725  WBC 10.0 10.6* 10.5  NEUTROABS 8.0*  --   --   HGB 13.2 12.0* 11.0*  HCT 39.8 37.4* 32.5*  MCV 90.2 89.9 88.8  PLT 209 169 121*   CBG:  Recent Labs Lab 05/05/16 1041 05/05/16 1722 05/05/16 2226 05/06/16 0646 05/06/16 1111  GLUCAP 157* 161* 202* 160* 170*   Hgb A1c  05/05/16 0725  HGBA1C 5.8*   Urinalysis    Component Value Date/Time   COLORURINE YELLOW 05/03/2016 1331   APPEARANCEUR CLEAR 05/03/2016 1331   LABSPEC 1.018 05/03/2016 1331    PHURINE 6.0 05/03/2016 1331   GLUCOSEU NEGATIVE 05/03/2016 1331   HGBUR NEGATIVE 05/03/2016 1331   BILIRUBINUR NEGATIVE 05/03/2016 1331   KETONESUR NEGATIVE 05/03/2016 1331   PROTEINUR NEGATIVE 05/03/2016 1331   UROBILINOGEN 0.2 11/13/2012 1238   NITRITE NEGATIVE 05/03/2016 1331   LEUKOCYTESUR NEGATIVE 05/03/2016 1331    Microbiology Recent Results (from the past 240 hour(s))  MRSA PCR Screening     Status: None   Collection Time: 05/03/16 11:30 PM  Result Value Ref Range Status   MRSA by PCR NEGATIVE NEGATIVE Final    Comment:        The GeneXpert MRSA Assay (FDA approved for NASAL specimens only), is one component of a comprehensive MRSA colonization surveillance program. It is not intended to diagnose MRSA infection nor to guide or monitor treatment for MRSA infections.     Time coordinating discharge: Approximately 40 minutes  Vance Gather, MD  [facility] 05/06/2016, 12:28 PM Pager [phone]

## 2016-05-06 NOTE — Clinical Social Work Placement (Signed)
   CLINICAL SOCIAL WORK PLACEMENT  NOTE  Date:  05/06/2016  Patient Details  Name: Thomas [name] MRN: [id number] Date of Birth: 1927-07-26  Clinical Social Work is seeking post-discharge placement for this patient at the [place] level of care (*CSW will initial, date and re-position this form in  chart as items are completed):      Patient/family provided with [place] Work Department's list of facilities offering this level of care within the geographic area requested by the patient (or if unable, by the patient's family).  Yes   Patient/family informed of their freedom to choose among providers that offer the needed level of care, that participate in Medicare, Medicaid or managed care program needed by the patient, have an available bed and are willing to accept the patient.      Patient/family informed of [facility]'s ownership interest in [facility] and [facility], as well as of the fact that they are under no obligation to receive care at these facilities.  PASRR submitted to EDS on       PASRR number received on 05/06/16     Existing PASRR number confirmed on       FL2 transmitted to all facilities in geographic area requested by pt/family on 05/06/16     FL2 transmitted to all facilities within larger geographic area on       Patient informed that his/her managed care company has contracts with or will negotiate with certain facilities, including the following:        Yes   Patient/family informed of bed offers received.  Patient chooses bed at [facility]     Physician recommends and patient chooses bed at      Patient to be transferred to [facility] on 05/06/16.  Patient to be transferred to facility by [facility]     Patient family notified on 05/06/16 of transfer.  Name of family member notified:  Rip Harbour     PHYSICIAN Please prepare priority discharge summary, including medications, Please prepare prescriptions,  Please sign FL2     Additional Comment:    _______________________________________________ Eileen Stanford, LCSW 05/06/2016, 10:49 AM

## 2016-05-06 NOTE — NC FL2 (Signed)
[place] LEVEL OF CARE SCREENING TOOL     IDENTIFICATION  Patient Name: Thomas [name] Birthdate: [birth year] Sex: male Admission Date (Current Location): 05/03/2016  [facility] and [place] Number:  Herbalist and Address:  [facility], [place] [address] [place], [place] [address]      Provider Number: [id number]  Attending Physician Name and Address:  Patrecia Pour, MD  Relative Name and Phone Number:       Current Level of Care: Hospital Recommended Level of Care: [place] Prior Approval Number:    Date Approved/Denied:   PASRR Number: [id number]  Discharge Plan: SNF    Current Diagnoses: Patient Active Problem List   Diagnosis Date Noted  . Protein-calorie malnutrition, severe 05/05/2016  . Nondisplaced intertrochanteric fracture of left femur, initial encounter for closed fracture ([place]) 05/03/2016  . Type 2 diabetes mellitus with hyperglycemia, with long-term current use of insulin ([place]) 05/03/2016  . Type 1 diabetes mellitus with hyperlipidemia ([place]) 05/03/2016  . Essential hypertension 05/03/2016  . Altered mental status 07/06/2015  . Normocytic anemia 07/06/2015  . Hypotension 07/06/2015  . Anxiety 07/06/2015  . Arterial hypotension   . Abnormal laboratory test result 11/01/2012  . Epigastric pain 09/30/2012  . NSTEMI (non-ST elevated myocardial infarction) ([place]) 09/30/2012  . Acute renal failure ([place]) 09/09/2012  . Acute encephalopathy 09/07/2012  . Malignant neoplasm of upper lobe, bronchus or lung 02/19/2011  . Right bundle branch block and left anterior fascicular block 03/31/2009  . CHEST PAIN-UNSPECIFIED 03/31/2009  . Insulin dependent diabetes mellitus ([place]) 07/18/2008  . HYPERLIPIDEMIA-MIXED 07/18/2008  . CAD, ARTERY BYPASS GRAFT 07/18/2008  . COPD (chronic obstructive pulmonary disease) ([place]) 07/18/2008    Orientation RESPIRATION BLADDER Height & Weight     Self  Normal Continent  Weight: 150 lb (68 kg) Height:  '5\' 11"'$  (180.3 cm)  BEHAVIORAL SYMPTOMS/MOOD NEUROLOGICAL BOWEL NUTRITION STATUS      Continent  (Please see d/c summary)  AMBULATORY STATUS COMMUNICATION OF NEEDS Skin   Limited Assist Verbally Surgical wounds (Closed incision left hip, Hydrocolloid dressing)                       Personal Care Assistance Level of Assistance  Bathing, Feeding, Dressing Bathing Assistance: Limited assistance Feeding assistance: Independent Dressing Assistance: Limited assistance     Functional Limitations Info  Sight, Hearing, Speech Sight Info: Adequate Hearing Info: Adequate Speech Info: Adequate    SPECIAL CARE FACTORS FREQUENCY  PT (By licensed PT), OT (By licensed OT)     PT Frequency: 3x OT Frequency: 3x            Contractures Contractures Info: Not present    Additional Factors Info  Code Status, Allergies Code Status Info: Full Code Allergies Info: No known allergies           Current Medications (05/06/2016):  This is the current hospital active medication list Current Facility-Administered Medications  Medication Dose Route Frequency Provider Last Rate Last Dose  . 0.9 %  sodium chloride infusion   Intravenous Continuous Aundra Dubin, PA-C 50 mL/hr at 05/05/16 1619    . aspirin tablet 325 mg  325 mg Oral Daily Aundra Dubin, PA-C   325 mg at 05/06/16 9628  . atorvastatin (LIPITOR) tablet 40 mg  40 mg Oral Daily Orson Eva, MD   40 mg at 05/06/16 0951  . ceFAZolin (ANCEF) IVPB 2g/100 mL premix  2 g Intravenous  Q8H Aundra Dubin, PA-C   2 g at 05/06/16 0520  . citalopram (CELEXA) tablet 10 mg  10 mg Oral Daily Orson Eva, MD   10 mg at 05/06/16 0951  . diltiazem (CARDIZEM CD) 24 hr capsule 240 mg  240 mg Oral Daily Orson Eva, MD   240 mg at 05/06/16 0951  . docusate sodium (COLACE) capsule 100 mg  100 mg Oral BID Orson Eva, MD   100 mg at 05/06/16 0951  . donepezil (ARICEPT) tablet 5 mg  5 mg Oral QHS Orson Eva, MD   5 mg at 05/05/16  2053  . feeding supplement (ENSURE ENLIVE) (ENSURE ENLIVE) liquid 237 mL  237 mL Oral BID BM Patrecia Pour, MD   237 mL at 05/06/16 0953  . heparin injection 5,000 Units  5,000 Units Subcutaneous Q8H Orson Eva, MD   5,000 Units at 05/06/16 0520  . HYDROcodone-acetaminophen (NORCO/VICODIN) 5-325 MG per tablet 1-2 tablet  1-2 tablet Oral Q6H PRN Orson Eva, MD   2 tablet at 05/06/16 0230  . insulin aspart (novoLOG) injection 0-9 Units  0-9 Units Subcutaneous TID WC Orson Eva, MD   2 Units at 05/06/16 0705  . ipratropium-albuterol (DUONEB) 0.5-2.5 (3) MG/3ML nebulizer solution 3 mL  3 mL Nebulization Q6H PRN Orson Eva, MD      . isosorbide mononitrate (IMDUR) 24 hr tablet 30 mg  30 mg Oral Daily Orson Eva, MD   30 mg at 05/06/16 0951  . lactated ringers infusion   Intravenous Continuous Roberts Gaudy, MD      . mometasone-formoterol [facility]) 200-5 MCG/ACT inhaler 2 puff  2 puff Inhalation BID Orson Eva, MD   2 puff at 05/06/16 0755  . ondansetron (ZOFRAN) tablet 4 mg  4 mg Oral Q8H PRN Aundra Dubin, PA-C      . oxybutynin (DITROPAN-XL) 24 hr tablet 10 mg  10 mg Oral QHS Orson Eva, MD   10 mg at 05/05/16 2053  . senna (SENOKOT) tablet 8.6 mg  1 tablet Oral BID Orson Eva, MD   8.6 mg at 05/06/16 0951  . tiotropium (SPIRIVA) inhalation capsule 18 mcg  18 mcg Inhalation Daily Orson Eva, MD   18 mcg at 05/06/16 0756   Facility-Administered Medications Ordered in Other Encounters  Medication Dose Route Frequency Provider Last Rate Last Dose  . albuterol (PROVENTIL) (5 MG/ML) 0.5% nebulizer solution 2.5 mg  2.5 mg Nebulization Once Lendon Colonel, NP         Discharge Medications: Please see discharge summary for a list of discharge medications.  Relevant Imaging Results:  Relevant Lab Results:   Additional Information SSN: [id number]  Eileen Stanford, LCSW

## 2016-07-10 ENCOUNTER — Inpatient Hospital Stay (HOSPITAL_COMMUNITY)
Admission: EM | Admit: 2016-07-10 | Discharge: 2016-07-13 | DRG: 377 | Disposition: A | Discharge status: Skilled Nursing Facility | Payer: Medicare Other | Attending: Internal Medicine | Admitting: Internal Medicine

## 2016-07-10 ENCOUNTER — Inpatient Hospital Stay (HOSPITAL_COMMUNITY): Payer: Medicare Other

## 2016-07-10 ENCOUNTER — Encounter (HOSPITAL_COMMUNITY): Payer: Self-pay | Admitting: *Deleted

## 2016-07-10 ENCOUNTER — Emergency Department (HOSPITAL_COMMUNITY): Payer: Medicare Other

## 2016-07-10 DIAGNOSIS — K228 Other specified diseases of esophagus: Secondary | ICD-10-CM | POA: Diagnosis not present

## 2016-07-10 DIAGNOSIS — Z794 Long term (current) use of insulin: Secondary | ICD-10-CM

## 2016-07-10 DIAGNOSIS — Y95 Nosocomial condition: Secondary | ICD-10-CM | POA: Diagnosis present

## 2016-07-10 DIAGNOSIS — K31819 Angiodysplasia of stomach and duodenum without bleeding: Secondary | ICD-10-CM | POA: Diagnosis present

## 2016-07-10 DIAGNOSIS — Z8679 Personal history of other diseases of the circulatory system: Secondary | ICD-10-CM

## 2016-07-10 DIAGNOSIS — K920 Hematemesis: Secondary | ICD-10-CM

## 2016-07-10 DIAGNOSIS — K449 Diaphragmatic hernia without obstruction or gangrene: Secondary | ICD-10-CM | POA: Diagnosis present

## 2016-07-10 DIAGNOSIS — I2510 Atherosclerotic heart disease of native coronary artery without angina pectoris: Secondary | ICD-10-CM | POA: Diagnosis present

## 2016-07-10 DIAGNOSIS — Z951 Presence of aortocoronary bypass graft: Secondary | ICD-10-CM | POA: Diagnosis not present

## 2016-07-10 DIAGNOSIS — Z85118 Personal history of other malignant neoplasm of bronchus and lung: Secondary | ICD-10-CM | POA: Diagnosis not present

## 2016-07-10 DIAGNOSIS — E1165 Type 2 diabetes mellitus with hyperglycemia: Secondary | ICD-10-CM | POA: Diagnosis present

## 2016-07-10 DIAGNOSIS — I959 Hypotension, unspecified: Secondary | ICD-10-CM | POA: Diagnosis present

## 2016-07-10 DIAGNOSIS — Y92122 Bedroom in nursing home as the place of occurrence of the external cause: Secondary | ICD-10-CM | POA: Diagnosis not present

## 2016-07-10 DIAGNOSIS — E785 Hyperlipidemia, unspecified: Secondary | ICD-10-CM | POA: Diagnosis present

## 2016-07-10 DIAGNOSIS — J440 Chronic obstructive pulmonary disease with acute lower respiratory infection: Secondary | ICD-10-CM | POA: Diagnosis present

## 2016-07-10 DIAGNOSIS — F0390 Unspecified dementia without behavioral disturbance: Secondary | ICD-10-CM | POA: Diagnosis present

## 2016-07-10 DIAGNOSIS — K250 Acute gastric ulcer with hemorrhage: Principal | ICD-10-CM | POA: Diagnosis present

## 2016-07-10 DIAGNOSIS — Z7982 Long term (current) use of aspirin: Secondary | ICD-10-CM

## 2016-07-10 DIAGNOSIS — Z87891 Personal history of nicotine dependence: Secondary | ICD-10-CM | POA: Diagnosis not present

## 2016-07-10 DIAGNOSIS — T39015A Adverse effect of aspirin, initial encounter: Secondary | ICD-10-CM | POA: Diagnosis present

## 2016-07-10 DIAGNOSIS — R042 Hemoptysis: Secondary | ICD-10-CM | POA: Diagnosis not present

## 2016-07-10 DIAGNOSIS — K922 Gastrointestinal hemorrhage, unspecified: Secondary | ICD-10-CM

## 2016-07-10 DIAGNOSIS — E119 Type 2 diabetes mellitus without complications: Secondary | ICD-10-CM | POA: Diagnosis not present

## 2016-07-10 DIAGNOSIS — I10 Essential (primary) hypertension: Secondary | ICD-10-CM | POA: Diagnosis present

## 2016-07-10 DIAGNOSIS — IMO0001 Reserved for inherently not codable concepts without codable children: Secondary | ICD-10-CM

## 2016-07-10 DIAGNOSIS — J438 Other emphysema: Secondary | ICD-10-CM | POA: Diagnosis not present

## 2016-07-10 DIAGNOSIS — Z7951 Long term (current) use of inhaled steroids: Secondary | ICD-10-CM

## 2016-07-10 DIAGNOSIS — Z8546 Personal history of malignant neoplasm of prostate: Secondary | ICD-10-CM

## 2016-07-10 DIAGNOSIS — I452 Bifascicular block: Secondary | ICD-10-CM | POA: Diagnosis present

## 2016-07-10 DIAGNOSIS — D649 Anemia, unspecified: Secondary | ICD-10-CM | POA: Diagnosis present

## 2016-07-10 DIAGNOSIS — K2210 Ulcer of esophagus without bleeding: Secondary | ICD-10-CM | POA: Diagnosis not present

## 2016-07-10 DIAGNOSIS — Z79899 Other long term (current) drug therapy: Secondary | ICD-10-CM | POA: Diagnosis not present

## 2016-07-10 DIAGNOSIS — J189 Pneumonia, unspecified organism: Secondary | ICD-10-CM | POA: Diagnosis present

## 2016-07-10 DIAGNOSIS — J449 Chronic obstructive pulmonary disease, unspecified: Secondary | ICD-10-CM | POA: Diagnosis present

## 2016-07-10 LAB — GLUCOSE, CAPILLARY
GLUCOSE-CAPILLARY: 164 mg/dL — AB (ref 65–99)
GLUCOSE-CAPILLARY: 136 mg/dL — AB (ref 65–99)
Glucose-Capillary: 144 mg/dL — ABNORMAL HIGH (ref 65–99)

## 2016-07-10 LAB — COMPREHENSIVE METABOLIC PANEL
ALK PHOS: 76 U/L (ref 38–126)
ALT: 13 U/L — ABNORMAL LOW (ref 17–63)
ANION GAP: 7 (ref 5–15)
AST: 18 U/L (ref 15–41)
Albumin: 3.3 g/dL — ABNORMAL LOW (ref 3.5–5.0)
BILIRUBIN TOTAL: 0.8 mg/dL (ref 0.3–1.2)
BUN: 27 mg/dL — ABNORMAL HIGH (ref 6–20)
CALCIUM: 8.8 mg/dL — AB (ref 8.9–10.3)
CO2: 25 mmol/L (ref 22–32)
Chloride: 109 mmol/L (ref 101–111)
Creatinine, Ser: 1.14 mg/dL (ref 0.61–1.24)
GFR, EST NON AFRICAN AMERICAN: 56 mL/min — AB (ref >60)
Glucose, Bld: 177 mg/dL — ABNORMAL HIGH (ref 65–99)
Potassium: 4.3 mmol/L (ref 3.5–5.1)
Sodium: 141 mmol/L (ref 135–145)
TOTAL PROTEIN: 5.9 g/dL — AB (ref 6.5–8.1)

## 2016-07-10 LAB — I-STAT CHEM 8, ED
BUN: 29 mg/dL — ABNORMAL HIGH (ref 6–20)
CALCIUM ION: 1.21 mmol/L (ref 1.15–1.40)
CHLORIDE: 107 mmol/L (ref 101–111)
CREATININE: 1.2 mg/dL (ref 0.61–1.24)
GLUCOSE: 169 mg/dL — AB (ref 65–99)
HCT: 35 % — ABNORMAL LOW (ref 39.0–52.0)
Hemoglobin: 11.9 g/dL — ABNORMAL LOW (ref 13.0–17.0)
Potassium: 4.4 mmol/L (ref 3.5–5.1)
Sodium: 142 mmol/L (ref 135–145)
TCO2: 27 mmol/L (ref 0–100)

## 2016-07-10 LAB — CBC
HCT: 37.7 % — ABNORMAL LOW (ref 39.0–52.0)
HEMOGLOBIN: 12.3 g/dL — AB (ref 13.0–17.0)
MCH: 30.1 pg (ref 26.0–34.0)
MCHC: 32.6 g/dL (ref 30.0–36.0)
MCV: 92.4 fL (ref 78.0–100.0)
Platelets: 193 10*3/uL (ref 150–400)
RBC: 4.08 MIL/uL — AB (ref 4.22–5.81)
RDW: 13.4 % (ref 11.5–15.5)
WBC: 21.3 10*3/uL — ABNORMAL HIGH (ref 4.0–10.5)

## 2016-07-10 LAB — HEMOGLOBIN AND HEMATOCRIT, BLOOD
HCT: 20.8 % — ABNORMAL LOW (ref 39.0–52.0)
Hemoglobin: 6.8 g/dL — CL (ref 13.0–17.0)

## 2016-07-10 LAB — PREPARE RBC (CROSSMATCH)

## 2016-07-10 LAB — LIPASE, BLOOD: Lipase: 33 U/L (ref 11–51)

## 2016-07-10 LAB — PROTIME-INR
INR: 1.23
Prothrombin Time: 15.6 seconds — ABNORMAL HIGH (ref 11.4–15.2)

## 2016-07-10 LAB — POC OCCULT BLOOD, ED: FECAL OCCULT BLD: NEGATIVE

## 2016-07-10 LAB — MRSA PCR SCREENING: MRSA BY PCR: NEGATIVE

## 2016-07-10 LAB — TSH: TSH: 0.888 u[IU]/mL (ref 0.350–4.500)

## 2016-07-10 LAB — I-STAT CG4 LACTIC ACID, ED: LACTIC ACID, VENOUS: 1.25 mmol/L (ref 0.5–1.9)

## 2016-07-10 MED ORDER — VITAMIN D (ERGOCALCIFEROL) 1.25 MG (50000 UNIT) PO CAPS
50000.0000 [IU] | ORAL_CAPSULE | ORAL | Status: DC
  Administered 2016-07-10: 50000 [IU] via ORAL
  Filled 2016-07-10: qty 1

## 2016-07-10 MED ORDER — SODIUM CHLORIDE 0.9 % IV SOLN
INTRAVENOUS | Status: DC
  Administered 2016-07-10: 17:00:00 via INTRAVENOUS

## 2016-07-10 MED ORDER — DEXTROSE 5 % IV SOLN
1.0000 g | Freq: Two times a day (BID) | INTRAVENOUS | Status: DC
  Filled 2016-07-10 (×3): qty 1

## 2016-07-10 MED ORDER — DEXTROSE 5 % IV SOLN
1.0000 g | Freq: Once | INTRAVENOUS | Status: AC
  Administered 2016-07-10: 1 g via INTRAVENOUS
  Filled 2016-07-10: qty 1

## 2016-07-10 MED ORDER — ONDANSETRON HCL 4 MG/2ML IJ SOLN
4.0000 mg | Freq: Once | INTRAMUSCULAR | Status: AC
  Administered 2016-07-10: 4 mg via INTRAVENOUS
  Filled 2016-07-10: qty 2

## 2016-07-10 MED ORDER — OXYBUTYNIN CHLORIDE ER 5 MG PO TB24
10.0000 mg | ORAL_TABLET | Freq: Every day | ORAL | Status: DC
  Administered 2016-07-11 – 2016-07-12 (×2): 10 mg via ORAL
  Filled 2016-07-10 (×2): qty 2

## 2016-07-10 MED ORDER — INSULIN ASPART 100 UNIT/ML ~~LOC~~ SOLN
3.0000 [IU] | Freq: Three times a day (TID) | SUBCUTANEOUS | Status: DC
  Administered 2016-07-10 – 2016-07-13 (×6): 3 [IU] via SUBCUTANEOUS

## 2016-07-10 MED ORDER — TIOTROPIUM BROMIDE MONOHYDRATE 18 MCG IN CAPS
18.0000 ug | ORAL_CAPSULE | Freq: Every day | RESPIRATORY_TRACT | Status: DC
  Administered 2016-07-10 – 2016-07-13 (×4): 18 ug via RESPIRATORY_TRACT
  Filled 2016-07-10: qty 5

## 2016-07-10 MED ORDER — PANTOPRAZOLE SODIUM 40 MG IV SOLR
INTRAVENOUS | Status: AC
  Filled 2016-07-10: qty 160

## 2016-07-10 MED ORDER — INSULIN ASPART 100 UNIT/ML ~~LOC~~ SOLN
0.0000 [IU] | Freq: Three times a day (TID) | SUBCUTANEOUS | Status: DC
  Administered 2016-07-10: 3 [IU] via SUBCUTANEOUS
  Administered 2016-07-10 – 2016-07-12 (×5): 2 [IU] via SUBCUTANEOUS

## 2016-07-10 MED ORDER — VANCOMYCIN HCL 10 G IV SOLR
1500.0000 mg | Freq: Once | INTRAVENOUS | Status: AC
  Administered 2016-07-10: 1500 mg via INTRAVENOUS
  Filled 2016-07-10: qty 1500

## 2016-07-10 MED ORDER — SODIUM CHLORIDE 0.9 % IV BOLUS (SEPSIS)
500.0000 mL | Freq: Once | INTRAVENOUS | Status: AC
  Administered 2016-07-10: 500 mL via INTRAVENOUS

## 2016-07-10 MED ORDER — DONEPEZIL HCL 5 MG PO TABS
5.0000 mg | ORAL_TABLET | Freq: Every day | ORAL | Status: DC
  Administered 2016-07-11 – 2016-07-12 (×2): 5 mg via ORAL
  Filled 2016-07-10 (×2): qty 1

## 2016-07-10 MED ORDER — INSULIN ASPART 100 UNIT/ML ~~LOC~~ SOLN: 0.0000 [IU] | Freq: Every day | SUBCUTANEOUS | Status: DC

## 2016-07-10 MED ORDER — SODIUM CHLORIDE 0.9 % IV SOLN
Freq: Once | INTRAVENOUS | Status: AC
  Administered 2016-07-10: 21:00:00 via INTRAVENOUS

## 2016-07-10 MED ORDER — VANCOMYCIN HCL 500 MG IV SOLR
500.0000 mg | Freq: Two times a day (BID) | INTRAVENOUS | Status: DC
  Administered 2016-07-11 – 2016-07-13 (×5): 500 mg via INTRAVENOUS
  Filled 2016-07-10 (×10): qty 500

## 2016-07-10 MED ORDER — CITALOPRAM HYDROBROMIDE 20 MG PO TABS
10.0000 mg | ORAL_TABLET | Freq: Every day | ORAL | Status: DC
  Administered 2016-07-10 – 2016-07-13 (×4): 10 mg via ORAL
  Filled 2016-07-10 (×4): qty 1

## 2016-07-10 MED ORDER — SODIUM CHLORIDE 0.9 % IV BOLUS (SEPSIS)
1000.0000 mL | Freq: Once | INTRAVENOUS | Status: AC
  Administered 2016-07-10: 1000 mL via INTRAVENOUS

## 2016-07-10 MED ORDER — ACETAMINOPHEN 500 MG PO TABS: 1000.0000 mg | ORAL_TABLET | Freq: Three times a day (TID) | ORAL | Status: DC | PRN

## 2016-07-10 MED ORDER — DEXTROSE-NACL 5-0.9 % IV SOLN
INTRAVENOUS | Status: DC
  Administered 2016-07-11 (×2): via INTRAVENOUS

## 2016-07-10 MED ORDER — MOMETASONE FURO-FORMOTEROL FUM 200-5 MCG/ACT IN AERO
2.0000 | INHALATION_SPRAY | Freq: Two times a day (BID) | RESPIRATORY_TRACT | Status: DC
  Administered 2016-07-10 – 2016-07-13 (×7): 2 via RESPIRATORY_TRACT
  Filled 2016-07-10: qty 8.8

## 2016-07-10 MED ORDER — ASPIRIN 325 MG PO TABS
325.0000 mg | ORAL_TABLET | Freq: Every day | ORAL | Status: DC
  Administered 2016-07-10: 325 mg via ORAL
  Filled 2016-07-10: qty 1

## 2016-07-10 MED ORDER — SENNA 8.6 MG PO TABS: 1.0000 | ORAL_TABLET | Freq: Two times a day (BID) | ORAL | Status: DC | PRN

## 2016-07-10 MED ORDER — IPRATROPIUM-ALBUTEROL 0.5-2.5 (3) MG/3ML IN SOLN: 3.0000 mL | Freq: Four times a day (QID) | RESPIRATORY_TRACT | Status: DC | PRN

## 2016-07-10 MED ORDER — PANTOPRAZOLE SODIUM 40 MG IV SOLR
40.0000 mg | Freq: Once | INTRAVENOUS | Status: AC
  Administered 2016-07-10: 40 mg via INTRAVENOUS
  Filled 2016-07-10: qty 40

## 2016-07-10 MED ORDER — ATORVASTATIN CALCIUM 40 MG PO TABS
40.0000 mg | ORAL_TABLET | Freq: Every day | ORAL | Status: DC
  Administered 2016-07-10 – 2016-07-13 (×4): 40 mg via ORAL
  Filled 2016-07-10 (×4): qty 1

## 2016-07-10 MED ORDER — IOPAMIDOL (ISOVUE-370) INJECTION 76%
100.0000 mL | Freq: Once | INTRAVENOUS | Status: AC | PRN
  Administered 2016-07-10: 100 mL via INTRAVENOUS

## 2016-07-10 MED ORDER — SODIUM CHLORIDE 0.9 % IV SOLN
80.0000 mg | Freq: Once | INTRAVENOUS | Status: AC
  Administered 2016-07-10: 80 mg via INTRAVENOUS
  Filled 2016-07-10: qty 80

## 2016-07-10 MED ORDER — HYDROCODONE-ACETAMINOPHEN 5-325 MG PO TABS: 1.0000 | ORAL_TABLET | Freq: Four times a day (QID) | ORAL | Status: DC | PRN

## 2016-07-10 MED ORDER — SODIUM CHLORIDE 0.9 % IV SOLN
8.0000 mg/h | INTRAVENOUS | Status: DC
  Administered 2016-07-10 – 2016-07-11 (×3): 8 mg/h via INTRAVENOUS
  Filled 2016-07-10 (×5): qty 80

## 2016-07-10 NOTE — ED Provider Notes (Signed)
[facility] DEPT Provider Note   CSN: [id number] Arrival date & time: 07/10/16  0540     History   Chief Complaint Chief Complaint  Patient presents with  . Hematemesis    HPI Thomas [name] is a 83 y.o. male.  Level V caveat for dementia. Patient brought from nursing home with complaints of vomiting blood. Patient states he has vomited twice overnight in the emesis was dark brown and black. He did have dried blood around his mouth and tongue for EMS. Initial hypotension for EMS in the 90s. He denies any dizziness or lightheadedness. Denies any history of vomiting blood in the past. Denies any history of ulcers or alcohol use. He denies any blood in his stool. He denies any chest pain or shortness of breath. He denies any abdominal pain. No anticoagulation use.   The history is provided by the patient and the EMS personnel. The history is limited by the condition of the patient.    Past Medical History:  Diagnosis Date  . Anginal pain ([place])   . Cancer ([place])   . Diabetes mellitus   . Hyperlipemia   . Lung cancer ([place])   . Prostate cancer [facility])     Patient Active Problem List   Diagnosis Date Noted  . Protein-calorie malnutrition, severe 05/05/2016  . Nondisplaced intertrochanteric fracture of left femur, initial encounter for closed fracture ([place]) 05/03/2016  . Type 2 diabetes mellitus with hyperglycemia, with long-term current use of insulin ([place]) 05/03/2016  . Type 1 diabetes mellitus with hyperlipidemia ([place]) 05/03/2016  . Essential hypertension 05/03/2016  . Altered mental status 07/06/2015  . Normocytic anemia 07/06/2015  . Hypotension 07/06/2015  . Anxiety 07/06/2015  . Arterial hypotension   . Abnormal laboratory test result 11/01/2012  . Epigastric pain 09/30/2012  . NSTEMI (non-ST elevated myocardial infarction) ([place]) 09/30/2012  . Acute renal failure ([place]) 09/09/2012  . Acute encephalopathy 09/07/2012  . Malignant neoplasm of upper lobe, bronchus or  lung 02/19/2011  . Right bundle branch block and left anterior fascicular block 03/31/2009  . CHEST PAIN-UNSPECIFIED 03/31/2009  . Insulin dependent diabetes mellitus ([place]) 07/18/2008  . HYPERLIPIDEMIA-MIXED 07/18/2008  . CAD, ARTERY BYPASS GRAFT 07/18/2008  . COPD (chronic obstructive pulmonary disease) ([place]) 07/18/2008    Past Surgical History:  Procedure Laterality Date  . BACK SURGERY    . CARDIAC SURGERY    . INTRAMEDULLARY (IM) NAIL INTERTROCHANTERIC Left 05/04/2016   Procedure: INTRAMEDULLARY (IM) NAIL INTERTROCHANTRIC;  Surgeon: Renette Butters, MD;  Location: [facility];  Service: Orthopedics;  Laterality: Left;  . PROSTATE SURGERY    . surgery to repair aneurysm    . TUMOR REMOVAL     from lung       Home Medications    Prior to Admission medications   Medication Sig Start Date End Date Taking? Authorizing Provider  acetaminophen (TYLENOL) 500 MG tablet Take 1,000 mg by mouth every 8 (eight) hours as needed for moderate pain.    [provider]  aspirin 325 MG tablet Take 325 mg by mouth daily.    [provider]  atorvastatin (LIPITOR) 40 MG tablet Take 40 mg by mouth daily.    [provider]  budesonide-formoterol (SYMBICORT) 160-4.5 MCG/ACT inhaler Inhale 2 puffs into the lungs 2 (two) times daily.    [provider]  citalopram (CELEXA) 10 MG tablet Take 1 tablet (10 mg total) by mouth daily. 07/10/15   Isaac Bliss, Rayford Halsted, MD  diltiazem (CARDIZEM CD) 240 MG 24  hr capsule TAKE ONE CAPSULE ORALLY EVERY MORNING. 03/01/15   Lendon Colonel, NP  donepezil (ARICEPT) 5 MG tablet Take 5 mg by mouth at bedtime.    [provider]  HYDROcodone-acetaminophen (NORCO) 5-325 MG tablet Take 1-2 tablets by mouth every 6 (six) hours as needed for moderate pain. 05/06/16   Martensen, Charna Elizabeth III, PA-C  insulin aspart (NOVOLOG) 100 UNIT/ML injection Iject 3 times daily with meals according to the instructions below: CBG < 70:  implement hypoglycemia protocol CBG 70 - 120: 0 units CBG 121 - 150: 1 unit CBG 151 - 200: 2 units CBG 201 - 250: 3 units CBG 251 - 300: 5 units CBG 301 - 350: 7 units CBG 351 - 400: 9 units CBG > 400: call MD and obtain STAT lab verification 05/06/16   Patrecia Pour, MD  ipratropium-albuterol (DUONEB) 0.5-2.5 (3) MG/3ML SOLN Take 3 mLs by nebulization every 6 (six) hours as needed (dyspnea cough/wheezing).    [provider]  isosorbide mononitrate (IMDUR) 30 MG 24 hr tablet TAKE ONE TABLET BY MOUTH ONCE DAILY. 02/17/15   Lendon Colonel, NP  omeprazole (PRILOSEC) 20 MG capsule Take 1 capsule (20 mg total) by mouth daily. For gastric protection While taking anti inflammatory medicine daily 05/06/16   Prudencio Burly III, PA-C  ONE TOUCH ULTRA TEST test strip  10/19/13   [provider]  oxybutynin (DITROPAN-XL) 10 MG 24 hr tablet Take 10 mg by mouth at bedtime.    [provider]  senna (SENOKOT) 8.6 MG TABS tablet Take 1 tablet (8.6 mg total) by mouth 2 (two) times daily as needed for mild constipation. 05/06/16   Patrecia Pour, MD  tiotropium (SPIRIVA) 18 MCG inhalation capsule Place 18 mcg into inhaler and inhale daily.    [provider]  Vitamin D, Ergocalciferol, (DRISDOL) 50000 units CAPS capsule Take 50,000 Units by mouth every 7 (seven) days.    [provider]    Family History History reviewed. No pertinent family history.  Social History Social History  Substance Use Topics  . Smoking status: Former Smoker    Packs/day: 1.00    Years: 50.00    Types: Cigarettes    Quit date: 12/10/1993  . Smokeless tobacco: Former Systems developer    Types: Chew  . Alcohol use No     Allergies   No known allergies   Review of Systems Review of Systems  Constitutional: Negative for activity change, appetite change and fever.  HENT: Negative for congestion and rhinorrhea.   Eyes: Negative for visual disturbance.  Respiratory: Negative for  cough, chest tightness and shortness of breath.   Gastrointestinal: Positive for nausea and vomiting. Negative for abdominal pain, diarrhea and rectal pain.  Genitourinary: Negative for dysuria and hematuria.  Musculoskeletal: Negative for arthralgias and myalgias.  Neurological: Negative for dizziness, weakness, light-headedness and headaches.   all other systems are negative except as noted in the HPI and PMH.     Physical Exam Updated Vital Signs BP (!) 126/54 (BP Location: Left Arm)   Pulse 90   Temp 98.9 F (37.2 C) (Oral)   Resp 18   Ht 5\' 11"  (1.803 m)   Wt 68 kg (150 lb)   SpO2 97%   BMI 20.92 kg/m   Physical Exam  Constitutional: He appears well-developed and well-nourished. No distress.  Chronically ill appearing  HENT:  Head: Normocephalic and atraumatic.  Mouth/Throat: Oropharynx is clear and moist. No oropharyngeal exudate.  Dry  mucus membranes with dark blood in mouth  Eyes: Conjunctivae and EOM are normal. Pupils are equal, round, and reactive to light.  Neck: Normal range of motion. Neck supple.  No meningismus.  Cardiovascular: Normal rate, regular rhythm, normal heart sounds and intact distal pulses.   No murmur heard. Pulmonary/Chest: Effort normal and breath sounds normal. No respiratory distress. He exhibits no tenderness.  Abdominal: Soft. There is no tenderness. There is no rebound and no guarding.  Musculoskeletal: Normal range of motion. He exhibits no edema or tenderness.  Neurological: He is alert. No cranial nerve deficit. He exhibits normal muscle tone. Coordination normal.  No ataxia on finger to nose bilaterally. No pronator drift. 5/5 strength throughout. CN 2-12 intact.Equal grip strength. Sensation intact.   Skin: Skin is warm. Capillary refill takes less than 2 seconds.  Psychiatric: He has a normal mood and affect. His behavior is normal.  Nursing note and vitals reviewed.    ED Treatments / Results  Labs (all labs ordered are listed,  but only abnormal results are displayed) Labs Reviewed  CBC - Abnormal; Notable for the following:       Result Value   WBC 21.3 (*)    RBC 4.08 (*)    Hemoglobin 12.3 (*)    HCT 37.7 (*)    All other components within normal limits  PROTIME-INR - Abnormal; Notable for the following:    Prothrombin Time 15.6 (*)    All other components within normal limits  I-STAT CHEM 8, ED - Abnormal; Notable for the following:    BUN 29 (*)    Glucose, Bld 169 (*)    Hemoglobin 11.9 (*)    HCT 35.0 (*)    All other components within normal limits  CULTURE, BLOOD (ROUTINE X 2)  CULTURE, BLOOD (ROUTINE X 2)  LIPASE, BLOOD  COMPREHENSIVE METABOLIC PANEL  POC OCCULT BLOOD, ED  POC OCCULT BLOOD, ED  I-STAT CG4 LACTIC ACID, ED  TYPE AND SCREEN    EKG  EKG Interpretation  Date/Time:  Wednesday July 10 2016 05:49:07 EDT Ventricular Rate:  91 PR Interval:    QRS Duration: 144 QT Interval:  372 QTC Calculation: 458 R Axis:   -101 Text Interpretation:  Sinus rhythm RBBB and LAFB Artifact in lead(s) V2 Artifact Confirmed by Ezequiel Essex [phone]) on 07/10/2016 6:35:49 AM       Radiology Dg Chest Portable 1 View  Result Date: 07/10/2016 CLINICAL DATA:  History of lung carcinoma.  Hematemesis. EXAM: PORTABLE CHEST 1 VIEW COMPARISON:  May 03, 2016 FINDINGS: There is airspace consolidation in the mid and lower lung zones, superimposed on chronic atelectasis and scarring in the right mid and lower lung zones. There is also scarring in the upper lobes bilaterally, stable. No edema or consolidation is evident on the left. There is postoperative change on the left. Heart size is within normal limits. Pulmonary vascular is within normal limits. No adenopathy evident. There is aortic atherosclerosis. Patient is status post coronary artery bypass grafting. No bone lesions. IMPRESSION: Airspace opacity felt to represent pneumonia in the right mid and lower lung zones. There is underlying scarring and volume  loss in the right base. There is scarring in each upper lobe. There is postoperative change on the left, stable. No consolidation on the left. Stable cardiac silhouette. There is aortic atherosclerosis. Electronically Signed   By: Lowella Grip III M.D.   On: 07/10/2016 07:18   Dg Abd Portable 2 Views  Result Date: 07/10/2016 CLINICAL DATA:  Hematemesis EXAM: PORTABLE ABDOMEN - 2 VIEW COMPARISON:  CT abdomen and pelvis January 21, 2013 FINDINGS: There is moderate stool in the colon. There is no bowel dilatation or air-fluid level to suggest bowel obstruction. No free air. There are surgical clips the pelvis. Patient has had kyphoplasty procedure at L1. There are also surgical clips in the gallbladder fossa region as well as in the upper abdomen just to the left of midline. IMPRESSION: Moderate stool in colon. No bowel obstruction or free air. Multiple areas of postoperative change noted. Electronically Signed   By: Lowella Grip III M.D.   On: 07/10/2016 07:19    Procedures Procedures (including critical care time)  Medications Ordered in ED Medications  sodium chloride 0.9 % bolus 1,000 mL (1,000 mLs Intravenous New Bag/Given 07/10/16 0635)  0.9 %  sodium chloride infusion (not administered)  pantoprazole (PROTONIX) injection 40 mg (40 mg Intravenous Given 07/10/16 0612)  ondansetron (ZOFRAN) injection 4 mg (4 mg Intravenous Given 07/10/16 0612)     Initial Impression / Assessment and Plan / ED Course  I have reviewed the triage vital signs and the nursing notes.  Pertinent labs & imaging results that were available during my care of the patient were reviewed by me and considered in my medical decision making (see chart for details).     Patient presents with questionable vomiting blood. Denies any chest pain or shortness of breath. Denies abdominal pain. Denies any dizziness or lightheadedness. No anticoagulation or previous history of GI bleed.   Patient's vital are stable. He denies  any pain. Hemoglobin is stable from previous values. FOBT negative.  Leukocytosis noted with normal lactate. X-rays concerning for right-sided pneumonia. Question whether patient's "hematemesis" was actually coughing with hemoptysis.  Patient's vital signs are stable. He is given IV fluids and IV antibiotics after cultures are obtained. Lactate is normal.  Patient will be treated for healthcare associated pneumonia. No evidence of further bleeding in the ED. Patient no respiratory distress. Admission discussed with Dr. Marin Comment.   Final Clinical Impressions(s) / ED Diagnoses   Final diagnoses:  HCAP (healthcare-associated pneumonia)  Hematemesis with nausea    New Prescriptions New Prescriptions   No medications on file     Ezequiel Essex, MD 07/10/16 [phone]

## 2016-07-10 NOTE — ED Notes (Signed)
Pt given 1162ml fluid bolus en route by [facility]

## 2016-07-10 NOTE — Progress Notes (Signed)
Pharmacy Antibiotic Note  Thomas [name] is a 83 y.o. male admitted on 07/10/2016 with pneumonia.  Pharmacy has been consulted for Vancomycin dosing.  Plan: Vancomycin 1500mg  loading dose, then 500mg  IV every 12 hours.  Goal trough 15-20 mcg/mL. Also on, cefepime 1gm IV q12h F/U cxs and clinical progress Monitor V/S, labs, and levels as indicated  Height: 5\' 11"  (180.3 cm) Weight: 156 lb (70.8 kg) IBW/kg (Calculated) : 75.3  Temp (24hrs), Avg:98.7 F (37.1 C), Min:98.4 F (36.9 C), Max:98.9 F (37.2 C)   Recent Labs Lab 07/10/16 0638 07/10/16 0646 07/10/16 0647  WBC 21.3*  --   --   CREATININE 1.14 1.20  --   LATICACIDVEN  --   --  1.25    Estimated Creatinine Clearance: 44.3 mL/min (by C-G formula based on SCr of 1.2 mg/dL).    Allergies  Allergen Reactions  . No Known Allergies     Antimicrobials this admission: Vancomycin 6/6 >>  Cefepime 6/6  >>   Dose adjustments this admission: N/A  Microbiology results: 6/6 BCx: pending 05/03/16 MRSA PCR: neg  Thank you for allowing pharmacy to be a part of this patient's care.  Isac Sarna, BS Pharm D, [place] Clinical Pharmacist Pager [phone] 07/10/2016 10:52 AM

## 2016-07-10 NOTE — Progress Notes (Addendum)
CTSP re large amount of hematemesis. He was hypotensive, but responded to IVF to SBP 104. Have transferred him to ICU and start Protonix drip. No evidence of prior PUD or cirrhosis. Will transfuse him ahead 2 units of PRBCs. Spoke with Dr Dereck Leep who will see him now. He is mentation well and is better now with no longer evidence of shock. INR 1.2, Hb was 12.2 on admission. Dr Shanon Brow made aware.   Orvan Falconer MD FACP. Hospitalist.   Critical care time:  30 minutes.

## 2016-07-10 NOTE — H&P (Signed)
History and Physical    Thomas [name] TDV:[id number] DOB: Jun 05, 1927 DOA: 07/10/2016  PCP: Redmond School, MD  Patient coming from: SNF>    Chief Complaint:  Hemoptysis.   HPI:  Thomas [name] is a 83 y.o. male with medical history of hypertension, diabetes mellitus, lung cancer status post resection, COPD, anxiety, coronary artery disease, recent admission for ORIF, SNF resident, presented to the ER with dark coffee ground emesis.  He also has some coughs, and has no abdominal pain, black or bloody stool.  Evaluation in the ER showed CXR with infiltrate, Hb of 12.2 grams per dL, and leukocytosis with WBC of 21K.  His stool guaic is negative.  He was given IV Van/Cefepime for HCAP, and hospitalist was asked to admit him for further work up.    ED Course:  See above.  Rewiew of Systems:  Constitutional: Negative for malaise, fever and chills. No significant weight loss or weight gain Eyes: Negative for eye pain, redness and discharge, diplopia, visual changes, or flashes of light. ENMT: Negative for ear pain, hoarseness, nasal congestion, sinus pressure and sore throat. No headaches; tinnitus, drooling, or problem swallowing. Cardiovascular: Negative for chest pain, palpitations, diaphoresis, dyspnea and peripheral edema. ; No orthopnea, PND Respiratory: Negative for  wheezing and stridor. No pleuritic chestpain. Gastrointestinal: Negative for diarrhea, constipation,  melena, blood in stool, hematemesis, jaundice and rectal bleeding.    Genitourinary: Negative for frequency, dysuria, incontinence,flank pain and hematuria; Musculoskeletal: Negative for back pain and neck pain. Negative for swelling and trauma.;  Skin: . Negative for pruritus, rash, abrasions, bruising and skin lesion.; ulcerations Neuro: Negative for headache, lightheadedness and neck stiffness. Negative for weakness, altered level of consciousness , altered mental status, extremity weakness, burning feet, involuntary  movement, seizure and syncope.  Psych: negative for anxiety, depression, insomnia, tearfulness, panic attacks, hallucinations, paranoia, suicidal or homicidal ideation  Past Medical History:  Diagnosis Date  . Anginal pain ([place])   . Cancer ([place])   . Diabetes mellitus   . Hyperlipemia   . Lung cancer ([place])   . Prostate cancer [facility])     Past Surgical History:  Procedure Laterality Date  . BACK SURGERY    . CARDIAC SURGERY    . INTRAMEDULLARY (IM) NAIL INTERTROCHANTERIC Left 05/04/2016   Procedure: INTRAMEDULLARY (IM) NAIL INTERTROCHANTRIC;  Surgeon: Renette Butters, MD;  Location: [place];  Service: Orthopedics;  Laterality: Left;  . PROSTATE SURGERY    . surgery to repair aneurysm    . TUMOR REMOVAL     from lung     reports that he quit smoking about 22 years ago. His smoking use included Cigarettes. He has a 50.00 pack-year smoking history. He has quit using smokeless tobacco. His smokeless tobacco use included Chew. He reports that he does not drink alcohol or use drugs.  Allergies  Allergen Reactions  . No Known Allergies     History reviewed. No pertinent family history.   Prior to Admission medications   Medication Sig Start Date End Date Taking? Authorizing Provider  acetaminophen (TYLENOL) 500 MG tablet Take 1,000 mg by mouth every 8 (eight) hours as needed for moderate pain.    [provider]  aspirin 325 MG tablet Take 325 mg by mouth daily.    [provider]  atorvastatin (LIPITOR) 40 MG tablet Take 40 mg by mouth daily.    [provider]  budesonide-formoterol (SYMBICORT) 160-4.5 MCG/ACT inhaler Inhale 2 puffs into the lungs 2 (two) times daily.  [provider]  citalopram (CELEXA) 10 MG tablet Take 1 tablet (10 mg total) by mouth daily. 07/10/15   Isaac Bliss, Rayford Halsted, MD  diltiazem (CARDIZEM CD) 240 MG 24 hr capsule TAKE ONE CAPSULE ORALLY EVERY MORNING. 03/01/15   Lendon Colonel, NP  donepezil (ARICEPT) 5 MG  tablet Take 5 mg by mouth at bedtime.    [provider]  HYDROcodone-acetaminophen (NORCO) 5-325 MG tablet Take 1-2 tablets by mouth every 6 (six) hours as needed for moderate pain. 05/06/16   Martensen, Charna Elizabeth III, PA-C  insulin aspart (NOVOLOG) 100 UNIT/ML injection Iject 3 times daily with meals according to the instructions below: CBG < 70: implement hypoglycemia protocol CBG 70 - 120: 0 units CBG 121 - 150: 1 unit CBG 151 - 200: 2 units CBG 201 - 250: 3 units CBG 251 - 300: 5 units CBG 301 - 350: 7 units CBG 351 - 400: 9 units CBG > 400: call MD and obtain STAT lab verification 05/06/16   Patrecia Pour, MD  ipratropium-albuterol (DUONEB) 0.5-2.5 (3) MG/3ML SOLN Take 3 mLs by nebulization every 6 (six) hours as needed (dyspnea cough/wheezing).    [provider]  isosorbide mononitrate (IMDUR) 30 MG 24 hr tablet TAKE ONE TABLET BY MOUTH ONCE DAILY. 02/17/15   Lendon Colonel, NP  omeprazole (PRILOSEC) 20 MG capsule Take 1 capsule (20 mg total) by mouth daily. For gastric protection While taking anti inflammatory medicine daily 05/06/16   Prudencio Burly III, PA-C  ONE TOUCH ULTRA TEST test strip  10/19/13   [provider]  oxybutynin (DITROPAN-XL) 10 MG 24 hr tablet Take 10 mg by mouth at bedtime.    [provider]  senna (SENOKOT) 8.6 MG TABS tablet Take 1 tablet (8.6 mg total) by mouth 2 (two) times daily as needed for mild constipation. 05/06/16   Patrecia Pour, MD  tiotropium (SPIRIVA) 18 MCG inhalation capsule Place 18 mcg into inhaler and inhale daily.    [provider]  Vitamin D, Ergocalciferol, (DRISDOL) 50000 units CAPS capsule Take 50,000 Units by mouth every 7 (seven) days.    [provider]    Physical Exam: Vitals:   07/10/16 0546 07/10/16 0549 07/10/16 0630  BP:  (!) 126/54 (!) 112/59  Pulse:  90 89  Resp:  18 (!) 27  Temp:  98.9 F (37.2 C)   TempSrc:  Oral   SpO2:  97% 95%  Weight: 68 kg (150 lb)     Height: 5\' 11"  (1.803 m)        Constitutional: NAD, calm, comfortable Vitals:   07/10/16 0546 07/10/16 0549 07/10/16 0630  BP:  (!) 126/54 (!) 112/59  Pulse:  90 89  Resp:  18 (!) 27  Temp:  98.9 F (37.2 C)   TempSrc:  Oral   SpO2:  97% 95%  Weight: 68 kg (150 lb)    Height: 5\' 11"  (1.803 m)     Eyes: PERRL, lids and conjunctivae normal ENMT: Mucous membranes are moist. Posterior pharynx clear of any exudate or lesions.Normal dentition.  Neck: normal, supple, no masses, no thyromegaly Respiratory: clear to auscultation bilaterally, no wheezing, no crackles. Normal respiratory effort. No accessory muscle use.  Cardiovascular: Regular rate and rhythm, no murmurs / rubs / gallops. No extremity edema. 2+ pedal pulses. No carotid bruits.  Abdomen: no tenderness, no masses palpated. No hepatosplenomegaly. Bowel sounds positive.  Musculoskeletal: no clubbing / cyanosis. No joint deformity upper and lower extremities.  Good ROM, no contractures. Normal muscle tone.  Skin: no rashes, lesions, ulcers. No induration Neurologic: CN 2-12 grossly intact. Sensation intact, DTR normal. Strength 5/5 in all 4.  Psychiatric: Normal judgment and insight. Alert and oriented x 3. Normal mood.   Labs on Admission: I have personally reviewed following labs and imaging studies  CBC:  Recent Labs Lab 07/10/16 0638 07/10/16 0646  WBC 21.3*  --   HGB 12.3* 11.9*  HCT 37.7* 35.0*  MCV 92.4  --   PLT 193  --    Basic Metabolic Panel:  Recent Labs Lab 07/10/16 0638 07/10/16 0646  NA 141 142  K 4.3 4.4  CL 109 107  CO2 25  --   GLUCOSE 177* 169*  BUN 27* 29*  CREATININE 1.14 1.20  CALCIUM 8.8*  --    GFR: Estimated Creatinine Clearance: 42.5 mL/min (by C-G formula based on SCr of 1.2 mg/dL). Liver Function Tests:  Recent Labs Lab 07/10/16 0638  AST 18  ALT 13*  ALKPHOS 76  BILITOT 0.8  PROT 5.9*  ALBUMIN 3.3*    Recent Labs Lab 07/10/16 0638  LIPASE 33   Coagulation  Profile:  Recent Labs Lab 07/10/16 0638  INR 1.23   Urine analysis:    Component Value Date/Time   COLORURINE YELLOW 05/03/2016 1331   APPEARANCEUR CLEAR 05/03/2016 1331   LABSPEC 1.018 05/03/2016 1331   PHURINE 6.0 05/03/2016 1331   GLUCOSEU NEGATIVE 05/03/2016 1331   HGBUR NEGATIVE 05/03/2016 1331   BILIRUBINUR NEGATIVE 05/03/2016 1331   KETONESUR NEGATIVE 05/03/2016 1331   PROTEINUR NEGATIVE 05/03/2016 1331   UROBILINOGEN 0.2 11/13/2012 1238   NITRITE NEGATIVE 05/03/2016 1331   LEUKOCYTESUR NEGATIVE 05/03/2016 1331    Radiological Exams on Admission: Dg Chest Portable 1 View  Result Date: 07/10/2016 CLINICAL DATA:  History of lung carcinoma.  Hematemesis. EXAM: PORTABLE CHEST 1 VIEW COMPARISON:  May 03, 2016 FINDINGS: There is airspace consolidation in the mid and lower lung zones, superimposed on chronic atelectasis and scarring in the right mid and lower lung zones. There is also scarring in the upper lobes bilaterally, stable. No edema or consolidation is evident on the left. There is postoperative change on the left. Heart size is within normal limits. Pulmonary vascular is within normal limits. No adenopathy evident. There is aortic atherosclerosis. Patient is status post coronary artery bypass grafting. No bone lesions. IMPRESSION: Airspace opacity felt to represent pneumonia in the right mid and lower lung zones. There is underlying scarring and volume loss in the right base. There is scarring in each upper lobe. There is postoperative change on the left, stable. No consolidation on the left. Stable cardiac silhouette. There is aortic atherosclerosis. Electronically Signed   By: Lowella Grip III M.D.   On: 07/10/2016 07:18   Dg Abd Portable 2 Views  Result Date: 07/10/2016 CLINICAL DATA:  Hematemesis EXAM: PORTABLE ABDOMEN - 2 VIEW COMPARISON:  CT abdomen and pelvis January 21, 2013 FINDINGS: There is moderate stool in the colon. There is no bowel dilatation or  air-fluid level to suggest bowel obstruction. No free air. There are surgical clips the pelvis. Patient has had kyphoplasty procedure at L1. There are also surgical clips in the gallbladder fossa region as well as in the upper abdomen just to the left of midline. IMPRESSION: Moderate stool in colon. No bowel obstruction or free air. Multiple areas of postoperative change noted. Electronically Signed   By: Lowella Grip III M.D.   On: 07/10/2016 07:19  EKG: Independently reviewed.  Assessment/Plan Principal Problem:   HCAP (healthcare-associated pneumonia) Active Problems:   Insulin dependent diabetes mellitus (HCC)   Right bundle branch block with left anterior fascicular block   COPD (chronic obstructive pulmonary disease) (HCC)   Type 2 diabetes mellitus with hyperglycemia, with long-term current use of insulin (HCC)   Hemoptysis    PLAN:   HCAP:  Will continue with IV dual antibiotics.  Give supplement oxygen.  Admit to telemetry. Hematemesis:  I suspect that he has hemoptysis rather than hematemesis.  Will continue to follow.   Tx underlying PNA> COPD   Stable.  Continue with Neb and home meds. Type II DM:  Will continue with SSI.  Follow CBGs.  DVT prophylaxis: Lovenox.  Code Status: FULL CODE.  Family Communication: None.  Disposition Plan: SNF when appropriate.  Consults called: None. Admission status:   Lunabelle Oatley MD FACP. [facility]  If 7PM-7AM, please contact night-coverage [email or web address] Password [email or web address]  07/10/2016, 8:29 AM

## 2016-07-10 NOTE — Consult Note (Signed)
Referring Provider: Orvan Falconer, MD Primary Care Physician:  Redmond School, MD Primary Gastroenterologist:  Dr. Laural Golden  Reason for Consultation:    Upper GI bleed.  HPI:   History is obtained from patient and chart. Patient unable to white detailed history because of dementia.  Patient is 83 year old Caucasian male with multiple medical problems who has been cared for at [facility] for about a year was brought to emergency room Early this morning after he was witnessed to have vomited blood. Evaluation in emergency room revealed WBC of 21.3 and H&H of 12.3 and 37.7 with platelet count of 193K. workup also revealed pneumonia. Patient was admitted to your and begun on IV antibiotic and IV pantoprazole. Earlier this evening he had large amount of hematemesis and he also had any bowel movement. He became hypotensive. Patient was resuspended with IV fluids with rapid normalization of his blood pressure. Patient was transferred to ICU for closer monitoring. He has not had any more episodes of hematemesis or melena. He is getting ready to receive blood transfusion. He did receive a dose of aspirin earlier today. Patient recalls report history of peptic ulcer disease. He does not remember any details. He denies shortness of breath or cough. He also denies chest or abdominal pain. He states he has had good appetite. He states he has 2 daughters Thomas [name] who live in [place] and he also has 2 sons.   Past Medical History:  Diagnosis Date  . Anginal pain ([place])   . Cancer ([place])   . Diabetes mellitus   . Hyperlipemia   . Lung cancer ([place])   . Prostate cancer [facility])        H/O PUD according to patient.     History of colonic adenomas.    Past Surgical History:  Procedure Laterality Date  . BACK SURGERY    . CARDIAC SURGERY    . INTRAMEDULLARY (IM) NAIL INTERTROCHANTERIC Left 05/04/2016   Procedure: INTRAMEDULLARY (IM) NAIL INTERTROCHANTRIC;  Surgeon: Renette Butters, MD;  Location: [place];  Service: Orthopedics;  Laterality: Left;  . PROSTATE SURGERY    . surgery to repair aneurysm ?AAA    . TUMOR REMOVAL     from lung    Prior to Admission medications   Medication Sig Start Date End Date Taking? Authorizing Provider  acetaminophen (TYLENOL) 500 MG tablet Take 1,000 mg by mouth every 8 (eight) hours as needed for moderate pain.   Yes [provider]  aspirin 325 MG tablet Take 325 mg by mouth daily.   Yes [provider]  atorvastatin (LIPITOR) 40 MG tablet Take 40 mg by mouth daily.   Yes [provider]  budesonide-formoterol (SYMBICORT) 160-4.5 MCG/ACT inhaler Inhale 2 puffs into the lungs 2 (two) times daily.   Yes [provider]  citalopram (CELEXA) 10 MG tablet Take 1 tablet (10 mg total) by mouth daily. 07/10/15  Yes Isaac Bliss, Rayford Halsted, MD  diltiazem (CARDIZEM CD) 240 MG 24 hr capsule TAKE ONE CAPSULE ORALLY EVERY MORNING. 03/01/15  Yes Lendon Colonel, NP  donepezil (ARICEPT) 5 MG tablet Take 5 mg by mouth at bedtime.   Yes [provider]  ferrous sulfate 325 (65 FE) MG tablet Take 325 mg by mouth daily with breakfast.   Yes [provider]  HYDROcodone-acetaminophen (NORCO) 5-325 MG tablet Take 1-2 tablets by mouth every 6 (six) hours as needed for moderate pain. 05/06/16  Yes Prudencio Burly III, PA-C  insulin glargine (LANTUS)  100 UNIT/ML injection Inject 12 Units into the skin at bedtime.   Yes [provider]  ipratropium-albuterol (DUONEB) 0.5-2.5 (3) MG/3ML SOLN Take 3 mLs by nebulization every 6 (six) hours as needed (dyspnea cough/wheezing).   Yes [provider]  isosorbide mononitrate (IMDUR) 30 MG 24 hr tablet TAKE ONE TABLET BY MOUTH ONCE DAILY. 02/17/15  Yes Lendon Colonel, NP  Multiple Vitamins-Minerals (PRESERVISION AREDS) CAPS Take 1 capsule by mouth 2 (two) times daily.   Yes [provider]  omeprazole (PRILOSEC) 20 MG capsule Take 1 capsule (20 mg  total) by mouth daily. For gastric protection While taking anti inflammatory medicine daily 05/06/16  Yes Martensen, Charna Elizabeth III, PA-C  ONE TOUCH ULTRA TEST test strip  10/19/13  Yes [provider]  oxybutynin (DITROPAN-XL) 10 MG 24 hr tablet Take 10 mg by mouth at bedtime.   Yes [provider]  senna (SENOKOT) 8.6 MG TABS tablet Take 1 tablet (8.6 mg total) by mouth 2 (two) times daily as needed for mild constipation. 05/06/16  Yes Patrecia Pour, MD  tiotropium (SPIRIVA) 18 MCG inhalation capsule Place 18 mcg into inhaler and inhale daily.   Yes [provider]  Vitamin D, Ergocalciferol, (DRISDOL) 50000 units CAPS capsule Take 50,000 Units by mouth every 7 (seven) days.   Yes [provider]    Current Facility-Administered Medications  Medication Dose Route Frequency Provider Last Rate Last Dose  . 0.9 %  sodium chloride infusion   Intravenous Continuous Rancour, Stephen, MD 100 mL/hr at 07/10/16 1643    . 0.9 %  sodium chloride infusion   Intravenous Once Orvan Falconer, MD      . acetaminophen (TYLENOL) tablet 1,000 mg  1,000 mg Oral Q8H PRN Orvan Falconer, MD      . atorvastatin (LIPITOR) tablet 40 mg  40 mg Oral Daily Orvan Falconer, MD   40 mg at 07/10/16 1122  . ceFEPIme (MAXIPIME) 1 g in dextrose 5 % 50 mL IVPB  1 g Intravenous Q12H Orvan Falconer, MD      . citalopram (CELEXA) tablet 10 mg  10 mg Oral Daily Orvan Falconer, MD   10 mg at 07/10/16 1122  . dextrose 5 %-0.9 % sodium chloride infusion   Intravenous Continuous Orvan Falconer, MD 100 mL/hr at 07/10/16 1956    . donepezil (ARICEPT) tablet 5 mg  5 mg Oral QHS Orvan Falconer, MD      . HYDROcodone-acetaminophen (NORCO/VICODIN) 5-325 MG per tablet 1-2 tablet  1-2 tablet Oral Q6H PRN Orvan Falconer, MD      . insulin aspart (novoLOG) injection 0-15 Units  0-15 Units Subcutaneous TID WC Orvan Falconer, MD   2 Units at 07/10/16 1707  . insulin aspart (novoLOG) injection 0-5 Units  0-5 Units Subcutaneous QHS Orvan Falconer, MD      . insulin  aspart (novoLOG) injection 3 Units  3 Units Subcutaneous TID WC Orvan Falconer, MD   3 Units at 07/10/16 1707  . ipratropium-albuterol (DUONEB) 0.5-2.5 (3) MG/3ML nebulizer solution 3 mL  3 mL Nebulization Q6H PRN Orvan Falconer, MD      . mometasone-formoterol [facility]) 200-5 MCG/ACT inhaler 2 puff  2 puff Inhalation BID Orvan Falconer, MD   2 puff at 07/10/16 1945  . oxybutynin (DITROPAN-XL) 24 hr tablet 10 mg  10 mg Oral QHS Orvan Falconer, MD      . pantoprazole (PROTONIX) 80 mg in sodium chloride 0.9 % 100 mL IVPB  80 mg Intravenous Once Le,  Collier Salina, MD      . pantoprazole (PROTONIX) 80 mg in sodium chloride 0.9 % 250 mL (0.32 mg/mL) infusion  8 mg/hr Intravenous Continuous Orvan Falconer, MD      . senna [facility]) tablet 8.6 mg  1 tablet Oral BID PRN Orvan Falconer, MD      . sodium chloride 0.9 % bolus 1,000 mL  1,000 mL Intravenous Once Orvan Falconer, MD      . tiotropium [facility]) inhalation capsule 18 mcg  18 mcg Inhalation Daily Orvan Falconer, MD   18 mcg at 07/10/16 1041  . vancomycin (VANCOCIN) 500 mg in sodium chloride 0.9 % 100 mL IVPB  500 mg Intravenous Q12H Orvan Falconer, MD      . Vitamin D (Ergocalciferol) (DRISDOL) capsule 50,000 Units  50,000 Units Oral Q7 days Orvan Falconer, MD   50,000 Units at 07/10/16 1121   Facility-Administered Medications Ordered in Other Encounters  Medication Dose Route Frequency Provider Last Rate Last Dose  . albuterol (PROVENTIL) (5 MG/ML) 0.5% nebulizer solution 2.5 mg  2.5 mg Nebulization Once Lendon Colonel, NP        Allergies as of 07/10/2016 - Review Complete 07/10/2016  Allergen Reaction Noted  . No known allergies  05/04/2016    History reviewed. No pertinent family history.  Social History   Social History  . Marital status: Married    Spouse name: N/A  . Number of children: N/A  . Years of education: N/A   Occupational History  . Not on file.   Social History Main Topics  . Smoking status: Former Smoker    Packs/day: 1.00    Years: 50.00    Types: Cigarettes     Quit date: 12/10/1993  . Smokeless tobacco: Former Systems developer    Types: Chew  . Alcohol use No  . Drug use: No  . Sexual activity: No   Other Topics Concern  . Not on file   Social History Narrative  . No narrative on file    Review of Systems: See HPI, otherwise normal ROS  Physical Exam: Temp:  [98.1 F (36.7 C)-98.9 F (37.2 C)] 98.1 F (36.7 C) (06/06 1446) Pulse Rate:  [81-104] 93 (06/06 1900) Resp:  [18-27] 20 (06/06 1446) BP: (84-127)/(41-62) 88/42 (06/06 1900) SpO2:  [87 %-98 %] 87 % (06/06 1950) Weight:  [150 lb (68 kg)-156 lb (70.8 kg)] 156 lb (70.8 kg) (06/06 1009) Last BM Date: 07/10/16  Patient was seen at bedside in ICU. He is alert and responds appropriately to questions. He is very pale. Conjunctiva is also pale and sclerae nonicteric. Oropharyngeal mucosa is dry. He is edentulous. No neck masses or thyromegaly noted. He has midsternal scar. Cardiac exam with regular rhythm normal S1 and S2. No murmur or gallop noted. Lungs are clear to auscultation. Abdomen is symmetrical with long midline scar. Bowel sounds are normal. No bruits noted. On palpation abdomen is soft and nontender without organomegaly or masses. No peripheral edema or clubbing noted.   Lab Results:  Recent Labs  07/10/16 0638 07/10/16 0646  WBC 21.3*  --   HGB 12.3* 11.9*  HCT 37.7* 35.0*  PLT 193  --    BMET  Recent Labs  07/10/16 0638 07/10/16 0646  NA 141 142  K 4.3 4.4  CL 109 107  CO2 25  --   GLUCOSE 177* 169*  BUN 27* 29*  CREATININE 1.14 1.20  CALCIUM 8.8*  --    LFT  Recent Labs  07/10/16 [phone]  PROT 5.9*  ALBUMIN 3.3*  AST 18  ALT 13*  ALKPHOS 76  BILITOT 0.8   PT/INR  Recent Labs  07/10/16 0638  LABPROT 15.6*  INR 1.23   Hepatitis Panel No results for input(s): HEPBSAG, HCVAB, HEPAIGM, HEPBIGM in the last 72 hours.  Studies/Results: Dg Chest Portable 1 View  Result Date: 07/10/2016 CLINICAL DATA:  History of lung carcinoma.  Hematemesis.  EXAM: PORTABLE CHEST 1 VIEW COMPARISON:  May 03, 2016 FINDINGS: There is airspace consolidation in the mid and lower lung zones, superimposed on chronic atelectasis and scarring in the right mid and lower lung zones. There is also scarring in the upper lobes bilaterally, stable. No edema or consolidation is evident on the left. There is postoperative change on the left. Heart size is within normal limits. Pulmonary vascular is within normal limits. No adenopathy evident. There is aortic atherosclerosis. Patient is status post coronary artery bypass grafting. No bone lesions. IMPRESSION: Airspace opacity felt to represent pneumonia in the right mid and lower lung zones. There is underlying scarring and volume loss in the right base. There is scarring in each upper lobe. There is postoperative change on the left, stable. No consolidation on the left. Stable cardiac silhouette. There is aortic atherosclerosis. Electronically Signed   By: Lowella Grip III M.D.   On: 07/10/2016 07:18   Dg Abd Portable 2 Views  Result Date: 07/10/2016 CLINICAL DATA:  Hematemesis EXAM: PORTABLE ABDOMEN - 2 VIEW COMPARISON:  CT abdomen and pelvis January 21, 2013 FINDINGS: There is moderate stool in the colon. There is no bowel dilatation or air-fluid level to suggest bowel obstruction. No free air. There are surgical clips the pelvis. Patient has had kyphoplasty procedure at L1. There are also surgical clips in the gallbladder fossa region as well as in the upper abdomen just to the left of midline. IMPRESSION: Moderate stool in colon. No bowel obstruction or free air. Multiple areas of postoperative change noted. Electronically Signed   By: Lowella Grip III M.D.   On: 07/10/2016 07:19    Assessment;  Upper GI bleed in an elderly gentleman who is on full dose aspirin and it appears he also has had AAA repair. Remote history of peptic ulcer disease. In this situation aortoenteric fistula needs to be ruled  out.  Pneumonia. He has no respiratory symptoms. He could have aspirated when he had hematemesis yesterday.   Recommendations;  Agree with pantoprazole infusion and blood transfusion as his hemoglobin is expected to drop significantly. Proceed with CT abdomen and pelvis ASAP. Further recommendations to follow.   Please note that I called nursing home as well as patient's sister-in-law but there was no answer at any of these numbers. I signed the consent for blood transfusion as immediate family member is not available and transfusion is medically necessary.   LOS: 0 days   Thomas [name]  07/10/2016, 8:16 PM

## 2016-07-10 NOTE — Progress Notes (Signed)
Patient vomited a large amount of bright red blood with large clots noted and a large amount of dark red stool passed via rectum. Rapid response called, MD called and orders given. NS bolus started and patient transferred to ICU.

## 2016-07-10 NOTE — ED Triage Notes (Signed)
Pt brought in by [facility] for c/o vomiting blood; pt states he started getting sick yesterday and states his emesis was brown with "nuts" mixed in; pt has dried blood around mouth and tongue, pt denies any pain; pt cbg was 260 en route

## 2016-07-11 ENCOUNTER — Encounter (HOSPITAL_COMMUNITY)
Admission: EM | Disposition: A | Discharge status: Skilled Nursing Facility | Payer: Self-pay | Source: Home / Self Care | Attending: Internal Medicine

## 2016-07-11 DIAGNOSIS — K31819 Angiodysplasia of stomach and duodenum without bleeding: Secondary | ICD-10-CM

## 2016-07-11 DIAGNOSIS — K920 Hematemesis: Secondary | ICD-10-CM | POA: Diagnosis present

## 2016-07-11 DIAGNOSIS — K228 Other specified diseases of esophagus: Secondary | ICD-10-CM

## 2016-07-11 DIAGNOSIS — K449 Diaphragmatic hernia without obstruction or gangrene: Secondary | ICD-10-CM

## 2016-07-11 DIAGNOSIS — K2210 Ulcer of esophagus without bleeding: Secondary | ICD-10-CM

## 2016-07-11 HISTORY — PX: ESOPHAGOGASTRODUODENOSCOPY: SHX5428

## 2016-07-11 LAB — CBC
HCT: 28.6 % — ABNORMAL LOW (ref 39.0–52.0)
HEMATOCRIT: 22 % — AB (ref 39.0–52.0)
HEMATOCRIT: 27.9 % — AB (ref 39.0–52.0)
HEMOGLOBIN: 7.3 g/dL — AB (ref 13.0–17.0)
HEMOGLOBIN: 9.6 g/dL — AB (ref 13.0–17.0)
Hemoglobin: 9.8 g/dL — ABNORMAL LOW (ref 13.0–17.0)
MCH: 30.2 pg (ref 26.0–34.0)
MCH: 31 pg (ref 26.0–34.0)
MCH: 30.6 pg (ref 26.0–34.0)
MCHC: 33.2 g/dL (ref 30.0–36.0)
MCHC: 34.4 g/dL (ref 30.0–36.0)
MCHC: 34.3 g/dL (ref 30.0–36.0)
MCV: 90.9 fL (ref 78.0–100.0)
MCV: 90 fL (ref 78.0–100.0)
MCV: 89.4 fL (ref 78.0–100.0)
PLATELETS: 135 10*3/uL — AB (ref 150–400)
Platelets: 109 10*3/uL — ABNORMAL LOW (ref 150–400)
Platelets: 146 10*3/uL — ABNORMAL LOW (ref 150–400)
RBC: 2.42 MIL/uL — ABNORMAL LOW (ref 4.22–5.81)
RBC: 3.1 MIL/uL — ABNORMAL LOW (ref 4.22–5.81)
RBC: 3.2 MIL/uL — AB (ref 4.22–5.81)
RDW: 14.6 % (ref 11.5–15.5)
RDW: 14.9 % (ref 11.5–15.5)
RDW: 15 % (ref 11.5–15.5)
WBC: 10.1 10*3/uL (ref 4.0–10.5)
WBC: 13 10*3/uL — ABNORMAL HIGH (ref 4.0–10.5)
WBC: 11.5 10*3/uL — ABNORMAL HIGH (ref 4.0–10.5)

## 2016-07-11 LAB — HEMOGLOBIN AND HEMATOCRIT, BLOOD
HCT: 26.4 % — ABNORMAL LOW (ref 39.0–52.0)
HEMATOCRIT: 28.4 % — AB (ref 39.0–52.0)
HEMATOCRIT: 28.9 % — AB (ref 39.0–52.0)
HEMOGLOBIN: 9.8 g/dL — AB (ref 13.0–17.0)
HEMOGLOBIN: 8.9 g/dL — AB (ref 13.0–17.0)
Hemoglobin: 9.9 g/dL — ABNORMAL LOW (ref 13.0–17.0)

## 2016-07-11 LAB — COMPREHENSIVE METABOLIC PANEL
ALBUMIN: 2.1 g/dL — AB (ref 3.5–5.0)
ALK PHOS: 42 U/L (ref 38–126)
ALT: 8 U/L — ABNORMAL LOW (ref 17–63)
ANION GAP: 2 — AB (ref 5–15)
AST: 11 U/L — ABNORMAL LOW (ref 15–41)
BILIRUBIN TOTAL: 1.7 mg/dL — AB (ref 0.3–1.2)
BUN: 41 mg/dL — ABNORMAL HIGH (ref 6–20)
CALCIUM: 7.4 mg/dL — AB (ref 8.9–10.3)
CO2: 22 mmol/L (ref 22–32)
Chloride: 117 mmol/L — ABNORMAL HIGH (ref 101–111)
Creatinine, Ser: 0.93 mg/dL (ref 0.61–1.24)
GLUCOSE: 339 mg/dL — AB (ref 65–99)
POTASSIUM: 3.9 mmol/L (ref 3.5–5.1)
Sodium: 141 mmol/L (ref 135–145)
TOTAL PROTEIN: 3.8 g/dL — AB (ref 6.5–8.1)

## 2016-07-11 LAB — GLUCOSE, CAPILLARY
GLUCOSE-CAPILLARY: 129 mg/dL — AB (ref 65–99)
Glucose-Capillary: 147 mg/dL — ABNORMAL HIGH (ref 65–99)
Glucose-Capillary: 150 mg/dL — ABNORMAL HIGH (ref 65–99)
Glucose-Capillary: 125 mg/dL — ABNORMAL HIGH (ref 65–99)

## 2016-07-11 LAB — MRSA PCR SCREENING: MRSA BY PCR: NEGATIVE

## 2016-07-11 LAB — PREPARE RBC (CROSSMATCH)

## 2016-07-11 SURGERY — EGD (ESOPHAGOGASTRODUODENOSCOPY)
Anesthesia: Moderate Sedation

## 2016-07-11 MED ORDER — SODIUM CHLORIDE 0.9 % IV SOLN: INTRAVENOUS | Status: DC

## 2016-07-11 MED ORDER — SUCRALFATE 1 GM/10ML PO SUSP
1.0000 g | Freq: Three times a day (TID) | ORAL | Status: DC
  Administered 2016-07-11 – 2016-07-13 (×7): 1 g via ORAL
  Filled 2016-07-11 (×7): qty 10

## 2016-07-11 MED ORDER — SODIUM CHLORIDE 0.9 % IV SOLN: Freq: Once | INTRAVENOUS | Status: DC

## 2016-07-11 MED ORDER — MIDAZOLAM HCL 5 MG/5ML IJ SOLN
INTRAMUSCULAR | Status: DC | PRN
  Administered 2016-07-11 (×4): 1 mg via INTRAVENOUS

## 2016-07-11 MED ORDER — FUROSEMIDE 10 MG/ML IJ SOLN
20.0000 mg | Freq: Once | INTRAMUSCULAR | Status: AC
  Administered 2016-07-11: 20 mg via INTRAVENOUS
  Filled 2016-07-11: qty 2

## 2016-07-11 MED ORDER — DEXTROSE 5 % IV SOLN
1.0000 g | Freq: Two times a day (BID) | INTRAVENOUS | Status: DC
  Administered 2016-07-11 – 2016-07-13 (×5): 1 g via INTRAVENOUS
  Filled 2016-07-11 (×9): qty 1

## 2016-07-11 MED ORDER — MIDAZOLAM HCL 2 MG/2ML IJ SOLN
INTRAMUSCULAR | Status: AC
  Filled 2016-07-11: qty 4

## 2016-07-11 MED ORDER — LIDOCAINE HCL 2 % EX GEL
CUTANEOUS | Status: DC | PRN
  Administered 2016-07-11: 1

## 2016-07-11 MED ORDER — PANTOPRAZOLE SODIUM 40 MG IV SOLR
INTRAVENOUS | Status: AC
  Filled 2016-07-11: qty 80

## 2016-07-11 MED ORDER — LIDOCAINE VISCOUS 2 % MT SOLN
OROMUCOSAL | Status: AC
  Filled 2016-07-11: qty 15

## 2016-07-11 MED ORDER — MEPERIDINE HCL 50 MG/ML IJ SOLN
INTRAMUSCULAR | Status: AC
  Filled 2016-07-11: qty 1

## 2016-07-11 NOTE — Op Note (Signed)
[facility] Patient Name: Thomas [name] Procedure Date: 07/11/2016 1:48 PM MRN: [id number] Date of Birth: 10/21/1927 Attending MD: Hildred Laser , MD CSN: [id number] Age: 83 Admit Type: Inpatient Procedure:                Upper GI endoscopy Indications:              Hematemesis Providers:                Hildred Laser, MD, Janeece Riggers, RN, Rosina Lowenstein, RN Referring MD:             Orvan Falconer, MD Medicines:                Lidocaine spray, Midazolam 4 mg IV Complications:            No immediate complications. Estimated Blood Loss:     Estimated blood loss: none. Procedure:                Pre-Anesthesia Assessment:                           - Prior to the procedure, a History and Physical                            was performed, and patient medications and                            allergies were reviewed. The patient's tolerance of                            previous anesthesia was also reviewed. The risks                            and benefits of the procedure and the sedation                            options and risks were discussed with the patient.                            All questions were answered, and informed consent                            was obtained. Prior Anticoagulants: The patient                            last took aspirin 1 day prior to the procedure. ASA                            Grade Assessment: III - A patient with severe                            systemic disease. After reviewing the risks and                            benefits, the patient was deemed in satisfactory  condition to undergo the procedure.                           After obtaining informed consent, the endoscope was                            passed under direct vision. Throughout the                            procedure, the patient's blood pressure, pulse, and                            oxygen saturations were monitored continuously. The              [phone]) was introduced through the mouth,                            and advanced to the second part of duodenum. The                            upper GI endoscopy was accomplished without                            difficulty. The patient tolerated the procedure                            well. Scope In: 2:11:31 PM Scope Out: 2:25:32 PM Total Procedure Duration: 0 hours 14 minutes 1 second  Findings:      The examined esophagus was normal.      One superficial esophageal ulcer with no bleeding and no stigmata of       recent bleeding was found 38 cm from the incisors. The lesion was 6 mm       in largest dimension.      The Z-line was irregular and was found 38 cm from the incisors.      A 2 cm hiatal hernia was present.      A small amount of food (residue) was found in the gastric body.      Two no bleeding angiodysplastic lesions were found in the gastric body.       Coagulation for bleeding prevention using Gold probe was successful.      The exam of the stomach was otherwise normal.      The duodenal bulb and second portion of the duodenum were normal. Impression:               - Normal esophagus.                           - Non-bleeding esophageal ulcer at GEJ.                           - Z-line irregular, 38 cm from the incisors.                           - 2 cm hiatal hernia.                           -  A small amount of food (residue) in the stomach.                           - Two non-bleeding angiodysplastic lesions in the                            stomach coagulated with cold probe.                           - Normal duodenal bulb and second portion of the                            duodenum.                           - No specimens collected.                           comment: He possibly bled from esophageal ulcer. Moderate Sedation:      Moderate (conscious) sedation was administered by the endoscopy nurse       and supervised by the endoscopist.  The following parameters were       monitored: oxygen saturation, heart rate, blood pressure, CO2       capnography and response to care. Total physician intraservice time was       26 minutes. Recommendation:           - Full liquid diet today.                           - Continue present medications.                           - sucralfate 1 g by mouth beforey at bedtime.                           - No aspirin, ibuprofen, naproxen, or other                            non-steroidal anti-inflammatory drugs for 2 weeks.                           - Return patient to ICU for ongoing care. Procedure Code(s):        --- Professional ---                           [phone], Esophagogastroduodenoscopy, flexible,                            transoral; with control of bleeding, any method                           99152, Moderate sedation services provided by the                            same physician or other qualified health care  professional performing the diagnostic or                            therapeutic service that the sedation supports,                            requiring the presence of an independent trained                            observer to assist in the monitoring of the                            patient's level of consciousness and physiological                            status; initial 15 minutes of intraservice time,                            patient age 57 years or older                           [phone], Moderate sedation services; each additional                            15 minutes intraservice time Diagnosis Code(s):        --- Professional ---                           K22.10, Ulcer of esophagus without bleeding                           K22.8, Other specified diseases of esophagus                           K44.9, Diaphragmatic hernia without obstruction or                            gangrene                           K31.819, Angiodysplasia  of stomach and duodenum                            without bleeding                           K92.0, Hematemesis CPT copyright 2016 American Medical Association. All rights reserved. The codes documented in this report are preliminary and upon coder review may  be revised to meet current compliance requirements. Hildred Laser, MD Hildred Laser, MD 07/11/2016 2:47:46 PM This report has been signed electronically. Number of Addenda: 0

## 2016-07-11 NOTE — Clinical Social Work Note (Signed)
Clinical Social Work Assessment  Patient Details  Name: Thomas [name] MRN: [id number] Date of Birth: 1927/07/21  Date of referral:  07/10/16               Reason for consult:  Discharge Planning                Permission sought to share information with:    Permission granted to share information::     Name::        Agency::     Relationship::     Contact Information:  DSS Supervisor D. McDonel   Housing/Transportation Living arrangements for the past 2 months:  [place] of Information:  Patient, Other (Comment Required) (Guardian at [place]) Patient Interpreter Needed:  None Criminal Activity/Legal Involvement Pertinent to Current Situation/Hospitalization:  No - Comment as needed Significant Relationships:  Adult Children, Other Family Members Lives with:  Facility Resident Do you feel safe going back to the place where you live?  Yes Need for family participation in patient care:  Yes (Comment)  Care giving concerns:  None identified. Facility resident.    Social Worker assessment / plan:  Patient has been at [facility] since last year. He uses a wheelchair and ADLs are completed by staff. Patient is able to feed himself. Patient stated that they sold his home, car and everything when he was placed in the facility. Patient stated that he would rather go live with his daughter in [place].  He will return to the facility at discharge. Supervisor in [place] at [place] was notified of patient's admission and admitting diagnosis. Patient's guardian is [facility].   Employment status:  Retired Nurse, adult, Medicaid In [place] PT Recommendations:  Not assessed at this time Information / Referral to community resources:     Patient/Family's Response to care:  [facility] DSS is agreeable for patient's return.   Patient/Family's Understanding of and Emotional Response to Diagnosis, Current Treatment, and  Prognosis:  [facility] DSS is aware of patient's diagnosis treatment and prognosis.   Emotional Assessment Appearance:  Appears stated age Attitude/Demeanor/Rapport:   (Cooperative) Affect (typically observed):  Accepting, Calm Orientation:  Oriented to Self, Oriented to Place Alcohol / Substance use:  Not Applicable Psych involvement (Current and /or in the community):  No (Comment)  Discharge Needs  Concerns to be addressed:  Other (Comment Required (Return to [facility] ) Readmission within the last 30 days:  No Current discharge risk:  None Barriers to Discharge:  No Barriers Identified   Ihor Gully, LCSW 07/11/2016, 9:56 AM

## 2016-07-11 NOTE — Progress Notes (Signed)
Updated his brother Vern. Attempted to call his daughter unsuccesfully, LVM @ 1 [phone] Jocelyn Lamer, HCP)  Orvan Falconer MD FACP. Hospitalist.

## 2016-07-11 NOTE — Progress Notes (Signed)
Sikes with patient's daughter Willa Rough and telephone consent obtained for EGD with 2 nurse verification, verified with this Probation officer and Terence Lux, RN. Consent placed in patient's chart.

## 2016-07-11 NOTE — Progress Notes (Signed)
Called Dr. Devonne Doughty complaining of chest tightness and squeezing. Denied burning. Dr. Laural Golden said to put the procedure on hold until patient had been cleared by the hospitalist.

## 2016-07-11 NOTE — Progress Notes (Signed)
Patient has had one large dark red, tarry stool via rectum around 0530 this morning. Noticed patient would bare down and strain when having bowel movement. Patient is alert and oriented. Will continue to monitor

## 2016-07-11 NOTE — NC FL2 (Signed)
[place] LEVEL OF CARE SCREENING TOOL     IDENTIFICATION  Patient Name: Thomas [name] Birthdate: 09-19-1927 Sex: male Admission Date (Current Location): 07/10/2016  [place] and [place] Number:  [facility] [id number] [place] and Address:  [facility] [address], [place]      Provider Number: [id number]  Attending Physician Name and Address:  Orvan Falconer, MD  Relative Name and Phone Number:       Current Level of Care: Hospital Recommended Level of Care: [place] Prior Approval Number:    Date Approved/Denied:   PASRR Number: [id number]  Discharge Plan: SNF    Current Diagnoses: Patient Active Problem List   Diagnosis Date Noted  . Hematemesis 07/11/2016  . Hemoptysis 07/10/2016  . HCAP (healthcare-associated pneumonia) 07/10/2016  . Protein-calorie malnutrition, severe 05/05/2016  . Nondisplaced intertrochanteric fracture of left femur, initial encounter for closed fracture ([place]) 05/03/2016  . Type 2 diabetes mellitus with hyperglycemia, with long-term current use of insulin ([place]) 05/03/2016  . Type 1 diabetes mellitus with hyperlipidemia ([place]) 05/03/2016  . Essential hypertension 05/03/2016  . Altered mental status 07/06/2015  . Normocytic anemia 07/06/2015  . Hypotension 07/06/2015  . Anxiety 07/06/2015  . Arterial hypotension   . Abnormal laboratory test result 11/01/2012  . Epigastric pain 09/30/2012  . NSTEMI (non-ST elevated myocardial infarction) ([place]) 09/30/2012  . Acute renal failure ([place]) 09/09/2012  . Acute encephalopathy 09/07/2012  . Malignant neoplasm of upper lobe, bronchus or lung 02/19/2011  . Right bundle branch block with left anterior fascicular block 03/31/2009  . CHEST PAIN-UNSPECIFIED 03/31/2009  . Insulin dependent diabetes mellitus ([place]) 07/18/2008  . HYPERLIPIDEMIA-MIXED 07/18/2008  . CAD, ARTERY BYPASS GRAFT 07/18/2008  . COPD (chronic obstructive pulmonary disease) ([place])  07/18/2008    Orientation RESPIRATION BLADDER Height & Weight     Self, Place  O2 (HFNC 5L) Incontinent Weight: 156 lb (70.8 kg) Height:  5\' 11"  (180.3 cm)  BEHAVIORAL SYMPTOMS/MOOD NEUROLOGICAL BOWEL NUTRITION STATUS      Incontinent Diet (Carb modified)  AMBULATORY STATUS COMMUNICATION OF NEEDS Skin   Extensive Assist Verbally Normal                       Personal Care Assistance Level of Assistance  Bathing, Feeding, Dressing Bathing Assistance: Limited assistance Feeding assistance: Independent Dressing Assistance: Limited assistance     Functional Limitations Info  Sight, Hearing, Speech Sight Info: Adequate Hearing Info: Adequate Speech Info: Adequate    SPECIAL CARE FACTORS FREQUENCY                       Contractures Contractures Info: Not present    Additional Factors Info  Code Status, Psychotropic Code Status Info: Full   Psychotropic Info: Celexa         Current Medications (07/11/2016):  This is the current hospital active medication list Current Facility-Administered Medications  Medication Dose Route Frequency Provider Last Rate Last Dose  . 0.9 %  sodium chloride infusion   Intravenous Continuous Ezequiel Essex, MD   Stopped at 07/10/16 1935  . 0.9 %  sodium chloride infusion   Intravenous Once Orvan Falconer, MD      . acetaminophen (TYLENOL) tablet 1,000 mg  1,000 mg Oral Q8H PRN Orvan Falconer, MD      . atorvastatin (LIPITOR) tablet 40 mg  40 mg Oral Daily Orvan Falconer, MD   40 mg at 07/10/16 1122  . ceFEPIme (MAXIPIME) 1  g in dextrose 5 % 50 mL IVPB  1 g Intravenous Q12H Orvan Falconer, MD   Stopped at 07/11/16 [phone]  . citalopram (CELEXA) tablet 10 mg  10 mg Oral Daily Orvan Falconer, MD   10 mg at 07/10/16 1122  . dextrose 5 %-0.9 % sodium chloride infusion   Intravenous Continuous Orvan Falconer, MD 50 mL/hr at 07/11/16 0815    . donepezil (ARICEPT) tablet 5 mg  5 mg Oral QHS Orvan Falconer, MD      . HYDROcodone-acetaminophen (NORCO/VICODIN) 5-325 MG per tablet  1-2 tablet  1-2 tablet Oral Q6H PRN Orvan Falconer, MD      . insulin aspart (novoLOG) injection 0-15 Units  0-15 Units Subcutaneous TID WC Orvan Falconer, MD   2 Units at 07/11/16 0818  . insulin aspart (novoLOG) injection 0-5 Units  0-5 Units Subcutaneous QHS Orvan Falconer, MD      . insulin aspart (novoLOG) injection 3 Units  3 Units Subcutaneous TID WC Orvan Falconer, MD   3 Units at 07/10/16 1707  . ipratropium-albuterol (DUONEB) 0.5-2.5 (3) MG/3ML nebulizer solution 3 mL  3 mL Nebulization Q6H PRN Orvan Falconer, MD      . mometasone-formoterol [facility]) 200-5 MCG/ACT inhaler 2 puff  2 puff Inhalation BID Orvan Falconer, MD   2 puff at 07/11/16 [phone]  . oxybutynin (DITROPAN-XL) 24 hr tablet 10 mg  10 mg Oral QHS Orvan Falconer, MD      . pantoprazole (PROTONIX) 80 mg in sodium chloride 0.9 % 250 mL (0.32 mg/mL) infusion  8 mg/hr Intravenous Continuous Orvan Falconer, MD 25 mL/hr at 07/11/16 0629 8 mg/hr at 07/11/16 0629  . senna (SENOKOT) tablet 8.6 mg  1 tablet Oral BID PRN Orvan Falconer, MD      . tiotropium [facility]) inhalation capsule 18 mcg  18 mcg Inhalation Daily Orvan Falconer, MD   18 mcg at 07/11/16 0849  . vancomycin (VANCOCIN) 500 mg in sodium chloride 0.9 % 100 mL IVPB  500 mg Intravenous Q12H Orvan Falconer, MD   Stopped at 07/11/16 [phone]  . Vitamin D (Ergocalciferol) (DRISDOL) capsule 50,000 Units  50,000 Units Oral Q7 days Orvan Falconer, MD   50,000 Units at 07/10/16 1121   Facility-Administered Medications Ordered in Other Encounters  Medication Dose Route Frequency Provider Last Rate Last Dose  . albuterol (PROVENTIL) (5 MG/ML) 0.5% nebulizer solution 2.5 mg  2.5 mg Nebulization Once Lendon Colonel, NP         Discharge Medications: Please see discharge summary for a list of discharge medications.  Relevant Imaging Results:  Relevant Lab Results:   Additional Information    Chariti Havel, Clydene Pugh, LCSW

## 2016-07-11 NOTE — Progress Notes (Signed)
Patient has no complaints. He was given clear liquids which she tolerated well. Has received 2 units of PRBCs. Hemoglobin is 9.8 g. He does not need any more transfusion. CTA last evening was negative for aortoenteric fistula. Nursing staff was able to contact patient's daughter Olegario Shearer who herself is in a hospital but agreed to proceed with EGD. EGD later today.

## 2016-07-11 NOTE — Progress Notes (Signed)
1316 Received report from endo nurse that patient c/o chest prior to transport for EGD. Dr.Rehman made aware. STAT 12 lead EKG ordered. Patient denies any chest pain at this time and reports that it has resolved at this time. Dr.Le paged.

## 2016-07-11 NOTE — Progress Notes (Signed)
1328 12 lead EKG resulted NSR, left axis deviation & RBBB. MD notified.

## 2016-07-11 NOTE — Progress Notes (Signed)
PROGRESS NOTE    Thomas [name]  ZOX:[id number] DOB: [birth year] DOA: 07/10/2016 PCP: Redmond School, MD    Brief Narrative: Thomas [name] a 83 y.o.malewith medical history of hypertension, diabetes mellitus, lung cancer status post resection, COPD, anxiety, coronary artery disease, recent admission for ORIF, SNF resident, presented to the ER with dark coffee ground emesis and HCAP.  He had severe hematemesis yesterday, dropping his Hb to 7 g per dL, despite 2 units PRBC transfused ahead.  GI was consulted, and Dr Dereck Leep saw him.  He did a CTA due to his hx of AAA repair, and no fistula was found.  This am, his hemodynamics remained stable, and he remained alert.     Assessment & Plan:   Principal Problem:   HCAP (healthcare-associated pneumonia) Active Problems:   Insulin dependent diabetes mellitus ([place])   Right bundle branch block with left anterior fascicular block   COPD (chronic obstructive pulmonary disease) ([place])   Type 2 diabetes mellitus with hyperglycemia, with long-term current use of insulin ([facility])   Hemoptysis   Hematemesis  Hematemesis:  Will give another 2 units of PRBCs given more slowly this time.  Give IV Lasix between first and second.  Turn down IVF to  50 cc per hour.  Continue with IV Protonix.  Give clear liquid.   Follow H and H q 12 hours.  HCAP:  Continue with IV Van/Zosyn.  COPD  Stable.  Not on steroid.  DM:  Will continue with SSI.    DVT prophylaxis: SCD.  Code Status: FULL CODE. Family Communication: None.  Disposition Plan: SNF when stable.   Consultants:   GI.  Dr Dereck Leep.   Procedures:   None.   Antimicrobials: Anti-infectives    Start     Dose/Rate Route Frequency Ordered Stop   07/11/16 0330  ceFEPIme (MAXIPIME) 1 g in dextrose 5 % 50 mL IVPB     1 g 100 mL/hr over 30 Minutes Intravenous Every 12 hours 07/11/16 0315     07/10/16 2100  vancomycin (VANCOCIN) 500 mg in sodium chloride 0.9 % 100 mL IVPB     500 mg 100 mL/hr  over 60 Minutes Intravenous Every 12 hours 07/10/16 1101     07/10/16 1015  ceFEPIme (MAXIPIME) 1 g in dextrose 5 % 50 mL IVPB  Status:  Discontinued     1 g 100 mL/hr over 30 Minutes Intravenous Every 12 hours 07/10/16 1004 07/11/16 0315   07/10/16 0745  vancomycin (VANCOCIN) 1,500 mg in sodium chloride 0.9 % 500 mL IVPB     1,500 mg 250 mL/hr over 120 Minutes Intravenous  Once 07/10/16 0737 07/10/16 1105   07/10/16 0730  ceFEPIme (MAXIPIME) 1 g in dextrose 5 % 50 mL IVPB     1 g 100 mL/hr over 30 Minutes Intravenous  Once 07/10/16 0724 07/10/16 0904      Subjective:  Feeling OK.   Objective: Vitals:   07/11/16 0430 07/11/16 0500 07/11/16 0530 07/11/16 0600  BP: (!) 99/40 (!) 148/46 (!) 102/38 (!) 101/46  Pulse: 76 (!) 102 74 80  Resp: 11 (!) 25 10 19   Temp:      TempSrc:      SpO2: 100% 100% 100% 100%  Weight:      Height:        Intake/Output Summary (Last 24 hours) at 07/11/16 0741 Last data filed at 07/11/16 0520  Gross per 24 hour  Intake          3587.08  ml  Output             1176 ml  Net          2411.08 ml   Filed Weights   07/10/16 0546 07/10/16 1009  Weight: 68 kg (150 lb) 70.8 kg (156 lb)    Examination:  General exam: Appears calm and comfortable  Respiratory system: Clear to auscultation. Respiratory effort normal. Cardiovascular system: S1 & S2 heard, RRR. No JVD, murmurs, rubs, gallops or clicks. No pedal edema. Gastrointestinal system: Abdomen is nondistended, soft and nontender. No organomegaly or masses felt. Normal bowel sounds heard. Central nervous system: Alert and oriented. No focal neurological deficits. Extremities: Symmetric 5 x 5 power. Skin: No rashes, lesions or ulcers Psychiatry: Judgement and insight appear normal. Mood & affect appropriate.   Data Reviewed: I have personally reviewed following labs and imaging studies  CBC:  Recent Labs Lab 07/10/16 0638 07/10/16 0646 07/10/16 1931 07/11/16 0327 07/11/16 0652  WBC 21.3*   --   --  10.1  --   HGB 12.3* 11.9* 6.8* 7.3* 9.8*  HCT 37.7* 35.0* 20.8* 22.0* 28.4*  MCV 92.4  --   --  90.9  --   PLT 193  --   --  109*  --    Basic Metabolic Panel:  Recent Labs Lab 07/10/16 0638 07/10/16 0646 07/11/16 0327  NA 141 142 141  K 4.3 4.4 3.9  CL 109 107 117*  CO2 25  --  22  GLUCOSE 177* 169* 339*  BUN 27* 29* 41*  CREATININE 1.14 1.20 0.93  CALCIUM 8.8*  --  7.4*   GFR: Estimated Creatinine Clearance: 57.1 mL/min (by C-G formula based on SCr of 0.93 mg/dL). Liver Function Tests:  Recent Labs Lab 07/10/16 0638 07/11/16 0327  AST 18 11*  ALT 13* 8*  ALKPHOS 76 42  BILITOT 0.8 1.7*  PROT 5.9* 3.8*  ALBUMIN 3.3* 2.1*    Recent Labs Lab 07/10/16 0638  LIPASE 33   No results for input(s): AMMONIA in the last 168 hours. Coagulation Profile:  Recent Labs Lab 07/10/16 0638  INR 1.23   CBG:  Recent Labs Lab 07/10/16 1138 07/10/16 1706 07/10/16 2216 07/11/16 0726  GLUCAP 164* 144* 136* 147*   Thyroid Function Tests:  Recent Labs  07/10/16 0638  TSH 0.888   Sepsis Labs:  Recent Labs Lab 07/10/16 0647  LATICACIDVEN 1.25    Recent Results (from the past 240 hour(s))  Blood culture (routine x 2)     Status: None (Preliminary result)   Collection Time: 07/10/16  8:08 AM  Result Value Ref Range Status   Specimen Description RIGHT ANTECUBITAL  Final   Special Requests   Final    BOTTLES DRAWN AEROBIC AND ANAEROBIC Blood Culture adequate volume   Culture NO GROWTH < 24 HOURS  Final   Report Status PENDING  Incomplete  Blood culture (routine x 2)     Status: None (Preliminary result)   Collection Time: 07/10/16  8:08 AM  Result Value Ref Range Status   Specimen Description BLOOD RIGHT ARM  Final   Special Requests   Final    BOTTLES DRAWN AEROBIC AND ANAEROBIC Blood Culture adequate volume   Culture NO GROWTH < 24 HOURS  Final   Report Status PENDING  Incomplete  MRSA PCR Screening     Status: None   Collection Time:  07/10/16 10:18 AM  Result Value Ref Range Status   MRSA by PCR NEGATIVE NEGATIVE Final  Comment:        The GeneXpert MRSA Assay (FDA approved for NASAL specimens only), is one component of a comprehensive MRSA colonization surveillance program. It is not intended to diagnose MRSA infection nor to guide or monitor treatment for MRSA infections.   MRSA PCR Screening     Status: None   Collection Time: 07/10/16  8:00 PM  Result Value Ref Range Status   MRSA by PCR NEGATIVE NEGATIVE Final    Comment:        The GeneXpert MRSA Assay (FDA approved for NASAL specimens only), is one component of a comprehensive MRSA colonization surveillance program. It is not intended to diagnose MRSA infection nor to guide or monitor treatment for MRSA infections.      Radiology Studies: Dg Chest Portable 1 View  Result Date: 07/10/2016 CLINICAL DATA:  History of lung carcinoma.  Hematemesis. EXAM: PORTABLE CHEST 1 VIEW COMPARISON:  May 03, 2016 FINDINGS: There is airspace consolidation in the mid and lower lung zones, superimposed on chronic atelectasis and scarring in the right mid and lower lung zones. There is also scarring in the upper lobes bilaterally, stable. No edema or consolidation is evident on the left. There is postoperative change on the left. Heart size is within normal limits. Pulmonary vascular is within normal limits. No adenopathy evident. There is aortic atherosclerosis. Patient is status post coronary artery bypass grafting. No bone lesions. IMPRESSION: Airspace opacity felt to represent pneumonia in the right mid and lower lung zones. There is underlying scarring and volume loss in the right base. There is scarring in each upper lobe. There is postoperative change on the left, stable. No consolidation on the left. Stable cardiac silhouette. There is aortic atherosclerosis. Electronically Signed   By: Lowella Grip III M.D.   On: 07/10/2016 07:18   Dg Abd Portable 2  Views  Result Date: 07/10/2016 CLINICAL DATA:  Hematemesis EXAM: PORTABLE ABDOMEN - 2 VIEW COMPARISON:  CT abdomen and pelvis January 21, 2013 FINDINGS: There is moderate stool in the colon. There is no bowel dilatation or air-fluid level to suggest bowel obstruction. No free air. There are surgical clips the pelvis. Patient has had kyphoplasty procedure at L1. There are also surgical clips in the gallbladder fossa region as well as in the upper abdomen just to the left of midline. IMPRESSION: Moderate stool in colon. No bowel obstruction or free air. Multiple areas of postoperative change noted. Electronically Signed   By: Lowella Grip III M.D.   On: 07/10/2016 07:19   Ct Angio Abd/pel W/ And/or W/o  Result Date: 07/10/2016 CLINICAL DATA:  Hematemesis EXAM: CTA ABDOMEN AND PELVIS WITHOUT AND WITH CONTRAST TECHNIQUE: Multidetector CT imaging of the abdomen and pelvis was performed using the standard protocol during bolus administration of intravenous contrast. Multiplanar reconstructed images and MIPs were obtained and reviewed to evaluate the vascular anatomy. CONTRAST:  100 cc Isovue 370 IV COMPARISON:  01/21/2013 CT FINDINGS: VASCULAR Aorta: Normal caliber aorta without aneurysm, dissection, vasculitis or significant stenosis. Mild atherosclerosis of the abdominal aorta some which is calcified. Celiac: The origin of the celiac axis may be stenotic and not well opacified. The splenic and common hepatic arteries are patent without significant stenosis. Left gastric is not well visualized. A tangle of vessels are seen in the mid abdomen similar to previous study from 2014 some which is due to tortuous SMA and branches of the celiac. The possibility of a vascular malformation is not entirely excluded. SMA: Patent without evidence  of aneurysm, dissection, vasculitis or significant stenosis. Tortuous without occlusion. Renals: Both renal arteries are patent without evidence of aneurysm, dissection,  vasculitis, fibromuscular dysplasia or significant stenosis. There is mild calcific atherosclerosis of the proximal right renal artery. IMA: Patent without evidence of aneurysm, dissection, vasculitis or significant stenosis. Inflow: Atherosclerotic calcifications of the common iliac arteries. No aneurysm, dissection or vasculitis. No significant stenosis. Proximal Outflow: Atherosclerosis of the common femoral arteries without significant stenosis, aneurysm or dissection. The visualized portions of the superficial profunda arteries are patent. Veins: No obvious venous abnormality within the limitations of this arterial phase study. Review of the MIP images confirms the above findings. NON-VASCULAR Lower chest: The visualized cardiac chambers are normal. Trace bilateral pleural effusions with scarring at the right lung base. Small foci of airspace opacity are seen in the right lower lobe. Aspiration is not excluded. Hepatobiliary: Cholecystectomy. No enhancing liver lesions. Small granuloma in the right hepatic lobe. Pancreas: Scattered pancreatic calcifications consistent with chronic pancreatitis. Spleen: Normal size spleen with splenic calcifications consistent with granulomatous disease. Adrenals/Urinary Tract: Normal bilateral adrenal glands. Multiple right-sided renal cysts are again noted the largest is currently 7.1 cm versus 6.7 cm on 11/13/2012. Stomach/Bowel: The stomach is physiologically distended. Fluid and ingested material is seen along the dependent aspect of the stomach which can obscure ulcerations and masses. No definite mural thickening. There is normal small bowel rotation. Small intestine is unremarkable. Moderate fecal residue within large bowel without inflammation or obstruction. There is distal descending and sigmoid diverticulosis without acute diverticulitis. Normal-appearing appendix. Lymphatic: No mesenteric, retroperitoneal, pelvic side wall or inguinal lymphadenopathy. Reproductive:  Status post prostate surgery and lymph node dissection with surgical clips in the prostatic bed. Penile implant present. Other: No free air nor free fluid. Musculoskeletal: Chronic compression of L1 with vertebral augmentation. Degenerative disc disease at L5-S1. Left femoral nail fixation. IMPRESSION: VASCULAR Tangle of vessels in the mid abdomen with tortuous appearing branches off the celiac axis and SMA contributing to this appearance. The possibility of collateralized vessels due to stenosis or potentially an unusual vascular malformation might account for this. No findings of an aortoenteric fistula. Critical Value/emergent results were called by telephone at the time of interpretation on 07/10/2016 at 11:46 pm to Dr. Hildred Laser , who verbally acknowledged these results. NON-VASCULAR 1. Trace bilateral pleural effusions with scarring at the right lung base. Minimal airspace opacities at the right lung base are noted for which aspiration is not entirely excluded. 2. Stigmata of chronic pancreatitis . 3. Splenic and hepatic granulomas. 4. The stomach wall does not appear thickened. The posterior wall is obscured by ingested material and fluid and therefore cannot be adequately assessed. No bowel perforation is noted. 5. Right-sided renal cysts. 6. Status post cholecystectomy. 7. Sigmoid diverticulosis without acute diverticulitis. Electronically Signed   By: Ashley Royalty M.D.   On: 07/10/2016 23:47    Scheduled Meds: . atorvastatin  40 mg Oral Daily  . citalopram  10 mg Oral Daily  . donepezil  5 mg Oral QHS  . furosemide  20 mg Intravenous Once  . insulin aspart  0-15 Units Subcutaneous TID WC  . insulin aspart  0-5 Units Subcutaneous QHS  . insulin aspart  3 Units Subcutaneous TID WC  . mometasone-formoterol  2 puff Inhalation BID  . oxybutynin  10 mg Oral QHS  . tiotropium  18 mcg Inhalation Daily  . Vitamin D (Ergocalciferol)  50,000 Units Oral Q7 days   Continuous Infusions: . sodium  chloride Stopped (  07/10/16 1935)  . sodium chloride    . ceFEPime (MAXIPIME) IV Stopped (07/11/16 0447)  . dextrose 5 % and 0.9% NaCl 100 mL/hr at 07/11/16 0253  . pantoprozole (PROTONIX) infusion 8 mg/hr (07/11/16 0629)  . vancomycin Stopped (07/11/16 0417)     LOS: 1 day   Tahari Clabaugh, MD FACP Hospitalist.   If 7PM-7AM, please contact night-coverage [email or web address] Password [facility] 07/11/2016, 7:41 AM

## 2016-07-11 NOTE — Plan of Care (Signed)
Problem: Pain Managment: Goal: General experience of comfort will improve Outcome: Completed/Met Date Met: 07/11/16 Pt reports that he hasn't had any pain

## 2016-07-11 NOTE — Progress Notes (Signed)
EGD findings.  Small ulcer at GE junction without stigmata of bleeding. Irregular GE junction and small sliding hiatal hernia. 2 AVMs at gastric body without stigmata of bleeding; ablated with cold probe. Small amount of food debris in the stomach.

## 2016-07-12 ENCOUNTER — Encounter (HOSPITAL_COMMUNITY): Payer: Self-pay | Admitting: Internal Medicine

## 2016-07-12 DIAGNOSIS — J189 Pneumonia, unspecified organism: Secondary | ICD-10-CM

## 2016-07-12 DIAGNOSIS — J438 Other emphysema: Secondary | ICD-10-CM

## 2016-07-12 DIAGNOSIS — K922 Gastrointestinal hemorrhage, unspecified: Secondary | ICD-10-CM

## 2016-07-12 DIAGNOSIS — K920 Hematemesis: Secondary | ICD-10-CM

## 2016-07-12 DIAGNOSIS — R042 Hemoptysis: Secondary | ICD-10-CM

## 2016-07-12 LAB — CBC
HEMATOCRIT: 25.7 % — AB (ref 39.0–52.0)
HEMATOCRIT: 25.8 % — AB (ref 39.0–52.0)
HEMOGLOBIN: 8.8 g/dL — AB (ref 13.0–17.0)
Hemoglobin: 8.7 g/dL — ABNORMAL LOW (ref 13.0–17.0)
MCH: 30.4 pg (ref 26.0–34.0)
MCH: 30.7 pg (ref 26.0–34.0)
MCHC: 33.9 g/dL (ref 30.0–36.0)
MCHC: 34.1 g/dL (ref 30.0–36.0)
MCV: 89.9 fL (ref 78.0–100.0)
MCV: 89.9 fL (ref 78.0–100.0)
Platelets: 133 10*3/uL — ABNORMAL LOW (ref 150–400)
Platelets: 133 10*3/uL — ABNORMAL LOW (ref 150–400)
RBC: 2.86 MIL/uL — ABNORMAL LOW (ref 4.22–5.81)
RBC: 2.87 MIL/uL — ABNORMAL LOW (ref 4.22–5.81)
RDW: 14.9 % (ref 11.5–15.5)
RDW: 15 % (ref 11.5–15.5)
WBC: 9.5 10*3/uL (ref 4.0–10.5)
WBC: 9.6 10*3/uL (ref 4.0–10.5)

## 2016-07-12 LAB — GLUCOSE, CAPILLARY
GLUCOSE-CAPILLARY: 94 mg/dL (ref 65–99)
Glucose-Capillary: 115 mg/dL — ABNORMAL HIGH (ref 65–99)
Glucose-Capillary: 123 mg/dL — ABNORMAL HIGH (ref 65–99)
Glucose-Capillary: 141 mg/dL — ABNORMAL HIGH (ref 65–99)

## 2016-07-12 MED ORDER — PANTOPRAZOLE SODIUM 40 MG PO TBEC
40.0000 mg | DELAYED_RELEASE_TABLET | Freq: Two times a day (BID) | ORAL | Status: DC
  Administered 2016-07-12 – 2016-07-13 (×3): 40 mg via ORAL
  Filled 2016-07-12 (×3): qty 1

## 2016-07-12 NOTE — Progress Notes (Signed)
Pharmacy Antibiotic Note  Thomas [name] is a 83 y.o. male admitted on 07/10/2016 with pneumonia.  Pharmacy has been consulted for Vancomycin dosing.  Plan: Vancomycin 500mg  IV every 12 hours.  Goal trough 15-20 mcg/mL. Cefepime 1gm IV q12h (per MD) F/U cxs and clinical progress Monitor V/S, labs, and levels as indicated  Height: 5\' 11"  (180.3 cm) Weight: 155 lb 13.8 oz (70.7 kg) IBW/kg (Calculated) : 75.3  Temp (24hrs), Avg:97.8 F (36.6 C), Min:97.5 F (36.4 C), Max:98.5 F (36.9 C)   Recent Labs Lab 07/10/16 0254 07/10/16 0646 07/10/16 0647 07/11/16 0327 07/11/16 0803 07/11/16 1457 07/12/16 0006 07/12/16 0452  WBC 21.3*  --   --  10.1 13.0* 11.5* 9.6 9.5  CREATININE 1.14 1.20  --  0.93  --   --   --   --   LATICACIDVEN  --   --  1.25  --   --   --   --   --     Estimated Creatinine Clearance: 57 mL/min (by C-G formula based on SCr of 0.93 mg/dL).    Allergies  Allergen Reactions  . No Known Allergies    Antimicrobials this admission: Vancomycin 6/6 >>  Cefepime 6/6  >>   Dose adjustments this admission: N/A  Microbiology results: 6/6 BCx: pending 05/03/16 MRSA PCR: neg  Thank you for allowing pharmacy to be a part of this patient's care.  Hart Robinsons, PharmD Clinical Pharmacist Pager:  [phone] 07/12/2016   07/12/2016 10:51 AM

## 2016-07-12 NOTE — Progress Notes (Signed)
REVIEWED-NO ADDITIONAL RECOMMENDATIONS.   Subjective: Feeling better/good. Denies abdomina pain, N/V, any hematemesis, hematochezia, melena. He is not wanting to go back to the nursing home, wants to go live with his family in [place]. No other GI complaints. Denies fatigue, weakness, chest pain, dyspnea.   Objective: Vital signs in last 24 hours: Temp:  [97.5 F (36.4 C)-98.5 F (36.9 C)] 97.9 F (36.6 C) (06/08 0710) Pulse Rate:  [62-93] 81 (06/08 0800) Resp:  [3-34] 17 (06/08 0800) BP: (82-139)/(34-105) 100/45 (06/08 0800) SpO2:  [72 %-100 %] 100 % (06/08 0800) Weight:  [155 lb 13.8 oz (70.7 kg)] 155 lb 13.8 oz (70.7 kg) (06/08 0500) Last BM Date: 07/11/16 General:   Alert and oriented, pleasant Head:  Normocephalic and atraumatic. Eyes:  No icterus, sclera clear. Conjuctiva pink.  Heart:  S1, S2 present, no murmurs noted.  Lungs: Bilateral expiratory wheezes. No other adventitious sounds noted.  Abdomen:  Bowel sounds present, soft, non-tender, non-distended. No HSM or hernias noted. No rebound or guarding. No masses appreciated  Msk:  Symmetrical without gross deformities. Pulses:  Normal bilateral DP pulses noted. Extremities:  Without clubbing or edema. Psych:  Alert and cooperative. Normal mood and affect.  Intake/Output from previous day: 06/07 0701 - 06/08 0700 In: 2337.5 [I.V.:2037.5; IV Piggyback:300] Out: 1800 [Urine:1800] Intake/Output this shift: Total I/O In: -  Out: 1700 [Urine:1700]  Lab Results:  Recent Labs  07/11/16 1457 07/11/16 1950 07/12/16 0006 07/12/16 0452  WBC 11.5*  --  9.6 9.5  HGB 9.8* 8.9* 8.7* 8.8*  HCT 28.6* 26.4* 25.7* 25.8*  PLT 135*  --  133* 133*   BMET  Recent Labs  07/10/16 0638 07/10/16 0646 07/11/16 0327  NA 141 142 141  K 4.3 4.4 3.9  CL 109 107 117*  CO2 25  --  22  GLUCOSE 177* 169* 339*  BUN 27* 29* 41*  CREATININE 1.14 1.20 0.93  CALCIUM 8.8*  --  7.4*   LFT  Recent Labs  07/10/16 0638 07/11/16 0327   PROT 5.9* 3.8*  ALBUMIN 3.3* 2.1*  AST 18 11*  ALT 13* 8*  ALKPHOS 76 42  BILITOT 0.8 1.7*   PT/INR  Recent Labs  07/10/16 0638  LABPROT 15.6*  INR 1.23   Hepatitis Panel No results for input(s): HEPBSAG, HCVAB, HEPAIGM, HEPBIGM in the last 72 hours.   Studies/Results: Ct Angio Abd/pel W/ And/or W/o  Result Date: 07/10/2016 CLINICAL DATA:  Hematemesis EXAM: CTA ABDOMEN AND PELVIS WITHOUT AND WITH CONTRAST TECHNIQUE: Multidetector CT imaging of the abdomen and pelvis was performed using the standard protocol during bolus administration of intravenous contrast. Multiplanar reconstructed images and MIPs were obtained and reviewed to evaluate the vascular anatomy. CONTRAST:  100 cc Isovue 370 IV COMPARISON:  01/21/2013 CT FINDINGS: VASCULAR Aorta: Normal caliber aorta without aneurysm, dissection, vasculitis or significant stenosis. Mild atherosclerosis of the abdominal aorta some which is calcified. Celiac: The origin of the celiac axis may be stenotic and not well opacified. The splenic and common hepatic arteries are patent without significant stenosis. Left gastric is not well visualized. A tangle of vessels are seen in the mid abdomen similar to previous study from 2014 some which is due to tortuous SMA and branches of the celiac. The possibility of a vascular malformation is not entirely excluded. SMA: Patent without evidence of aneurysm, dissection, vasculitis or significant stenosis. Tortuous without occlusion. Renals: Both renal arteries are patent without evidence of aneurysm, dissection, vasculitis, fibromuscular dysplasia or significant stenosis. There is  mild calcific atherosclerosis of the proximal right renal artery. IMA: Patent without evidence of aneurysm, dissection, vasculitis or significant stenosis. Inflow: Atherosclerotic calcifications of the common iliac arteries. No aneurysm, dissection or vasculitis. No significant stenosis. Proximal Outflow: Atherosclerosis of the common  femoral arteries without significant stenosis, aneurysm or dissection. The visualized portions of the superficial profunda arteries are patent. Veins: No obvious venous abnormality within the limitations of this arterial phase study. Review of the MIP images confirms the above findings. NON-VASCULAR Lower chest: The visualized cardiac chambers are normal. Trace bilateral pleural effusions with scarring at the right lung base. Small foci of airspace opacity are seen in the right lower lobe. Aspiration is not excluded. Hepatobiliary: Cholecystectomy. No enhancing liver lesions. Small granuloma in the right hepatic lobe. Pancreas: Scattered pancreatic calcifications consistent with chronic pancreatitis. Spleen: Normal size spleen with splenic calcifications consistent with granulomatous disease. Adrenals/Urinary Tract: Normal bilateral adrenal glands. Multiple right-sided renal cysts are again noted the largest is currently 7.1 cm versus 6.7 cm on 11/13/2012. Stomach/Bowel: The stomach is physiologically distended. Fluid and ingested material is seen along the dependent aspect of the stomach which can obscure ulcerations and masses. No definite mural thickening. There is normal small bowel rotation. Small intestine is unremarkable. Moderate fecal residue within large bowel without inflammation or obstruction. There is distal descending and sigmoid diverticulosis without acute diverticulitis. Normal-appearing appendix. Lymphatic: No mesenteric, retroperitoneal, pelvic side wall or inguinal lymphadenopathy. Reproductive: Status post prostate surgery and lymph node dissection with surgical clips in the prostatic bed. Penile implant present. Other: No free air nor free fluid. Musculoskeletal: Chronic compression of L1 with vertebral augmentation. Degenerative disc disease at L5-S1. Left femoral nail fixation. IMPRESSION: VASCULAR Tangle of vessels in the mid abdomen with tortuous appearing branches off the celiac axis and  SMA contributing to this appearance. The possibility of collateralized vessels due to stenosis or potentially an unusual vascular malformation might account for this. No findings of an aortoenteric fistula. Critical Value/emergent results were called by telephone at the time of interpretation on 07/10/2016 at 11:46 pm to Dr. Hildred Laser , who verbally acknowledged these results. NON-VASCULAR 1. Trace bilateral pleural effusions with scarring at the right lung base. Minimal airspace opacities at the right lung base are noted for which aspiration is not entirely excluded. 2. Stigmata of chronic pancreatitis . 3. Splenic and hepatic granulomas. 4. The stomach wall does not appear thickened. The posterior wall is obscured by ingested material and fluid and therefore cannot be adequately assessed. No bowel perforation is noted. 5. Right-sided renal cysts. 6. Status post cholecystectomy. 7. Sigmoid diverticulosis without acute diverticulitis. Electronically Signed   By: Ashley Royalty M.D.   On: 07/10/2016 23:47    Assessment: Principal Problem: Pneumonia. He has no respiratory symptoms. He could have aspirated when he had hematemesis yesterday. Continued expiratory wheezes, agree with continued antibiotics. No tachypnea or respiratory distress.  Hematemesis: Upper GI bleed in an elderly gentleman who is on full dose aspirin and it appears he also has had AAA repair. He had significant bleeding a couple nights ago. Received 2 U PRBC for anemia, hgb increased to 9.8. CTA negative for aortoenteric fistula. 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  Overall the patient is much improved.  Hgb this morning is stable at 8.9. He is insistent that he does not want to go to the nursing home but rather return  to live with his family in [place]. Discussed case with Dr. Laural Golden this morning who recommended continue IV PPI  infusion through Saturday morning then transition to bid IV PPI because of fragility.  Plan: 1. Continue to monitor for any recurrent GI bleed 2. Follow H/H (check tomorrow morning; sooner if bleeding noted) 3. Continue IV PPI infusion for 24 mor hours then IV PPI bid 4. Long term: bid Protonix 40 mg x 12 weeks, then down to daily Protonix 40 mg. 5. Supportive measures 6. Case Management consult to discuss patient d/c disposition   Thank you for allowing [place] to participate in the care of [facility]  Walden Field, DNP, AGNP-C Adult & Gerontological Nurse Practitioner [facility]     LOS: 2 days    07/12/2016, 9:18 AM

## 2016-07-12 NOTE — Progress Notes (Signed)
Nurse tech on AM shift informed me that patient had large dark bloody stool right before shift change [phone]

## 2016-07-12 NOTE — Progress Notes (Signed)
Patient resting in bed eating lunch. Vital signs are stable. O2 on 5LHFNC.  IV patent x2. Denies pain or any distress. Report given to [facility] LPN. Patient to be transferred to room 326.

## 2016-07-12 NOTE — Care Management (Signed)
CM received consult that patient wants to leave [facility] and live with family in [place]. As per CSW notes, patient is a ward of the state. CM contacted legal [place].  Patient is unable to make his own decisions and is on a locked unit at [facility]. He will have to return to [facility].

## 2016-07-12 NOTE — Progress Notes (Signed)
PROGRESS NOTE    Thomas [name]  GNF:[id number] DOB: [birth year] DOA: 07/10/2016 PCP: Redmond School, MD    Brief Narrative: Thomas [name] a 83 y.o.malewith medical history of hypertension, diabetes mellitus, lung cancer status post resection, COPD, anxiety, coronary artery disease, recent admission for ORIF, SNF resident, presented to the ER with dark coffee ground emesis and HCAP.  He had severe hematemesis yesterday, dropping his Hb to 7 g per dL, despite 2 units PRBC transfused ahead.  GI was consulted, and Dr Dereck Leep saw him.  He did a CTA due to his hx of AAA repair, and no fistula was found.  This am, his hemodynamics remained stable, and he remained alert.  He had some rectal bleeding yesterday.  EGD showed no bleeding ulcer at the EG junction and AVM.  This am, he has stable H and H.   Assessment & Plan:   Principal Problem:   HCAP (healthcare-associated pneumonia) Active Problems:   Insulin dependent diabetes mellitus ([place])   Right bundle branch block with left anterior fascicular block   COPD (chronic obstructive pulmonary disease) ([place])   Type 2 diabetes mellitus with hyperglycemia, with long-term current use of insulin (HCC)   Hemoptysis   Hematemesis  Hematemesis:  Stable H and H.  Change PPI drip to oral Protonix BID. Advance his diet to regular, and transfer to floor.   Follow H and H q 12 hours.  HCAP:  Continue with IV Van/Zosyn.  COPD  Stable.  Not on steroid.  DM:  Will continue with SSI.    DVT prophylaxis: SCD.  Code Status: FULL CODE. Family Communication: None.  Disposition Plan: SNF when stable.   Consultants:   GI.  Dr Dereck Leep.   Procedures:   EGD.    Antimicrobials: Anti-infectives    Start     Dose/Rate Route Frequency Ordered Stop   07/11/16 0330  ceFEPIme (MAXIPIME) 1 g in dextrose 5 % 50 mL IVPB     1 g 100 mL/hr over 30 Minutes Intravenous Every 12 hours 07/11/16 0315     07/10/16 2100  vancomycin (VANCOCIN) 500 mg in sodium  chloride 0.9 % 100 mL IVPB     500 mg 100 mL/hr over 60 Minutes Intravenous Every 12 hours 07/10/16 1101     07/10/16 1015  ceFEPIme (MAXIPIME) 1 g in dextrose 5 % 50 mL IVPB  Status:  Discontinued     1 g 100 mL/hr over 30 Minutes Intravenous Every 12 hours 07/10/16 1004 07/11/16 0315   07/10/16 0745  vancomycin (VANCOCIN) 1,500 mg in sodium chloride 0.9 % 500 mL IVPB     1,500 mg 250 mL/hr over 120 Minutes Intravenous  Once 07/10/16 0737 07/10/16 1105   07/10/16 0730  ceFEPIme (MAXIPIME) 1 g in dextrose 5 % 50 mL IVPB     1 g 100 mL/hr over 30 Minutes Intravenous  Once 07/10/16 0724 07/10/16 0904       Subjective:  Doing well.   No complaints.   Objective: Vitals:   07/12/16 0700 07/12/16 0710 07/12/16 0745 07/12/16 0800  BP: (!) 103/43   (!) 100/45  Pulse: 68   81  Resp: 19   17  Temp:  97.9 F (36.6 C)    TempSrc:  Oral    SpO2: 100%  99% 100%  Weight:      Height:        Intake/Output Summary (Last 24 hours) at 07/12/16 0837 Last data filed at 07/12/16 8469  Gross per 24  hour  Intake           2337.5 ml  Output             2975 ml  Net           -637.5 ml   Filed Weights   07/10/16 0546 07/10/16 1009 07/12/16 0500  Weight: 68 kg (150 lb) 70.8 kg (156 lb) 70.7 kg (155 lb 13.8 oz)    Examination:  General exam: Appears calm and comfortable  Respiratory system: Clear to auscultation. Respiratory effort normal. Cardiovascular system: S1 & S2 heard, RRR. No JVD, murmurs, rubs, gallops or clicks. No pedal edema. Gastrointestinal system: Abdomen is nondistended, soft and nontender. No organomegaly or masses felt. Normal bowel sounds heard. Central nervous system: Alert and oriented. No focal neurological deficits. Extremities: Symmetric 5 x 5 power. Skin: No rashes, lesions or ulcers Psychiatry: Judgement and insight appear normal. Mood & affect appropriate.   Data Reviewed: I have personally reviewed following labs and imaging studies  CBC:  Recent Labs Lab  07/11/16 0327  07/11/16 0803 07/11/16 1045 07/11/16 1457 07/11/16 1950 07/12/16 0006 07/12/16 0452  WBC 10.1  --  13.0*  --  11.5*  --  9.6 9.5  HGB 7.3*  < > 9.6* 9.9* 9.8* 8.9* 8.7* 8.8*  HCT 22.0*  < > 27.9* 28.9* 28.6* 26.4* 25.7* 25.8*  MCV 90.9  --  90.0  --  89.4  --  89.9 89.9  PLT 109*  --  146*  --  135*  --  133* 133*  < > = values in this interval not displayed. Basic Metabolic Panel:  Recent Labs Lab 07/10/16 0638 07/10/16 0646 07/11/16 0327  NA 141 142 141  K 4.3 4.4 3.9  CL 109 107 117*  CO2 25  --  22  GLUCOSE 177* 169* 339*  BUN 27* 29* 41*  CREATININE 1.14 1.20 0.93  CALCIUM 8.8*  --  7.4*   GFR: Estimated Creatinine Clearance: 57 mL/min (by C-G formula based on SCr of 0.93 mg/dL). Liver Function Tests:  Recent Labs Lab 07/10/16 0638 07/11/16 0327  AST 18 11*  ALT 13* 8*  ALKPHOS 76 42  BILITOT 0.8 1.7*  PROT 5.9* 3.8*  ALBUMIN 3.3* 2.1*    Recent Labs Lab 07/10/16 0638  LIPASE 33   Coagulation Profile:  Recent Labs Lab 07/10/16 0638  INR 1.23   CBG:  Recent Labs Lab 07/11/16 0726 07/11/16 1145 07/11/16 1614 07/11/16 2117 07/12/16 0733  GLUCAP 147* 150* 125* 129* 115*   Thyroid Function Tests:  Recent Labs  07/10/16 0638  TSH 0.888   Sepsis Labs:  Recent Labs Lab 07/10/16 0647  LATICACIDVEN 1.25    Recent Results (from the past 240 hour(s))  Blood culture (routine x 2)     Status: None (Preliminary result)   Collection Time: 07/10/16  8:08 AM  Result Value Ref Range Status   Specimen Description RIGHT ANTECUBITAL  Final   Special Requests   Final    BOTTLES DRAWN AEROBIC AND ANAEROBIC Blood Culture adequate volume   Culture NO GROWTH < 24 HOURS  Final   Report Status PENDING  Incomplete  Blood culture (routine x 2)     Status: None (Preliminary result)   Collection Time: 07/10/16  8:08 AM  Result Value Ref Range Status   Specimen Description BLOOD RIGHT ARM  Final   Special Requests   Final    BOTTLES  DRAWN AEROBIC AND ANAEROBIC Blood Culture adequate volume  Culture NO GROWTH < 24 HOURS  Final   Report Status PENDING  Incomplete  MRSA PCR Screening     Status: None   Collection Time: 07/10/16 10:18 AM  Result Value Ref Range Status   MRSA by PCR NEGATIVE NEGATIVE Final    Comment:        The GeneXpert MRSA Assay (FDA approved for NASAL specimens only), is one component of a comprehensive MRSA colonization surveillance program. It is not intended to diagnose MRSA infection nor to guide or monitor treatment for MRSA infections.   MRSA PCR Screening     Status: None   Collection Time: 07/10/16  8:00 PM  Result Value Ref Range Status   MRSA by PCR NEGATIVE NEGATIVE Final    Comment:        The GeneXpert MRSA Assay (FDA approved for NASAL specimens only), is one component of a comprehensive MRSA colonization surveillance program. It is not intended to diagnose MRSA infection nor to guide or monitor treatment for MRSA infections.      Radiology Studies: Ct Angio Abd/pel W/ And/or W/o  Result Date: 07/10/2016 CLINICAL DATA:  Hematemesis EXAM: CTA ABDOMEN AND PELVIS WITHOUT AND WITH CONTRAST TECHNIQUE: Multidetector CT imaging of the abdomen and pelvis was performed using the standard protocol during bolus administration of intravenous contrast. Multiplanar reconstructed images and MIPs were obtained and reviewed to evaluate the vascular anatomy. CONTRAST:  100 cc Isovue 370 IV COMPARISON:  01/21/2013 CT FINDINGS: VASCULAR Aorta: Normal caliber aorta without aneurysm, dissection, vasculitis or significant stenosis. Mild atherosclerosis of the abdominal aorta some which is calcified. Celiac: The origin of the celiac axis may be stenotic and not well opacified. The splenic and common hepatic arteries are patent without significant stenosis. Left gastric is not well visualized. A tangle of vessels are seen in the mid abdomen similar to previous study from 2014 some which is due to  tortuous SMA and branches of the celiac. The possibility of a vascular malformation is not entirely excluded. SMA: Patent without evidence of aneurysm, dissection, vasculitis or significant stenosis. Tortuous without occlusion. Renals: Both renal arteries are patent without evidence of aneurysm, dissection, vasculitis, fibromuscular dysplasia or significant stenosis. There is mild calcific atherosclerosis of the proximal right renal artery. IMA: Patent without evidence of aneurysm, dissection, vasculitis or significant stenosis. Inflow: Atherosclerotic calcifications of the common iliac arteries. No aneurysm, dissection or vasculitis. No significant stenosis. Proximal Outflow: Atherosclerosis of the common femoral arteries without significant stenosis, aneurysm or dissection. The visualized portions of the superficial profunda arteries are patent. Veins: No obvious venous abnormality within the limitations of this arterial phase study. Review of the MIP images confirms the above findings. NON-VASCULAR Lower chest: The visualized cardiac chambers are normal. Trace bilateral pleural effusions with scarring at the right lung base. Small foci of airspace opacity are seen in the right lower lobe. Aspiration is not excluded. Hepatobiliary: Cholecystectomy. No enhancing liver lesions. Small granuloma in the right hepatic lobe. Pancreas: Scattered pancreatic calcifications consistent with chronic pancreatitis. Spleen: Normal size spleen with splenic calcifications consistent with granulomatous disease. Adrenals/Urinary Tract: Normal bilateral adrenal glands. Multiple right-sided renal cysts are again noted the largest is currently 7.1 cm versus 6.7 cm on 11/13/2012. Stomach/Bowel: The stomach is physiologically distended. Fluid and ingested material is seen along the dependent aspect of the stomach which can obscure ulcerations and masses. No definite mural thickening. There is normal small bowel rotation. Small intestine  is unremarkable. Moderate fecal residue within large bowel without inflammation or  obstruction. There is distal descending and sigmoid diverticulosis without acute diverticulitis. Normal-appearing appendix. Lymphatic: No mesenteric, retroperitoneal, pelvic side wall or inguinal lymphadenopathy. Reproductive: Status post prostate surgery and lymph node dissection with surgical clips in the prostatic bed. Penile implant present. Other: No free air nor free fluid. Musculoskeletal: Chronic compression of L1 with vertebral augmentation. Degenerative disc disease at L5-S1. Left femoral nail fixation. IMPRESSION: VASCULAR Tangle of vessels in the mid abdomen with tortuous appearing branches off the celiac axis and SMA contributing to this appearance. The possibility of collateralized vessels due to stenosis or potentially an unusual vascular malformation might account for this. No findings of an aortoenteric fistula. Critical Value/emergent results were called by telephone at the time of interpretation on 07/10/2016 at 11:46 pm to Dr. Hildred Laser , who verbally acknowledged these results. NON-VASCULAR 1. Trace bilateral pleural effusions with scarring at the right lung base. Minimal airspace opacities at the right lung base are noted for which aspiration is not entirely excluded. 2. Stigmata of chronic pancreatitis . 3. Splenic and hepatic granulomas. 4. The stomach wall does not appear thickened. The posterior wall is obscured by ingested material and fluid and therefore cannot be adequately assessed. No bowel perforation is noted. 5. Right-sided renal cysts. 6. Status post cholecystectomy. 7. Sigmoid diverticulosis without acute diverticulitis. Electronically Signed   By: Ashley Royalty M.D.   On: 07/10/2016 23:47    Scheduled Meds: . atorvastatin  40 mg Oral Daily  . citalopram  10 mg Oral Daily  . donepezil  5 mg Oral QHS  . insulin aspart  0-15 Units Subcutaneous TID WC  . insulin aspart  0-5 Units Subcutaneous  QHS  . insulin aspart  3 Units Subcutaneous TID WC  . mometasone-formoterol  2 puff Inhalation BID  . oxybutynin  10 mg Oral QHS  . pantoprazole  40 mg Oral BID  . sucralfate  1 g Oral TID WC & HS  . tiotropium  18 mcg Inhalation Daily  . Vitamin D (Ergocalciferol)  50,000 Units Oral Q7 days   Continuous Infusions: . sodium chloride Stopped (07/10/16 1935)  . sodium chloride    . ceFEPime (MAXIPIME) IV Stopped (07/12/16 0352)  . vancomycin Stopped (07/12/16 0452)     LOS: 2 days   Jamareon Shimel, MD FACP Hospitalist.   If 7PM-7AM, please contact night-coverage [email or web address] Password [facility] 07/12/2016, 8:37 AM

## 2016-07-13 ENCOUNTER — Encounter (HOSPITAL_COMMUNITY): Payer: Self-pay | Admitting: *Deleted

## 2016-07-13 ENCOUNTER — Observation Stay (HOSPITAL_COMMUNITY)
Admission: AD | Admit: 2016-07-13 | Discharge: 2016-07-14 | Disposition: A | Discharge status: Home / Self Care | Payer: Medicare Other | Source: Ambulatory Visit | Attending: Internal Medicine | Admitting: Internal Medicine

## 2016-07-13 ENCOUNTER — Emergency Department (HOSPITAL_COMMUNITY)
Admission: EM | Admit: 2016-07-13 | Discharge: 2016-07-13 | Disposition: A | Discharge status: Other Acute Inpatient Hospital | Payer: Medicare Other

## 2016-07-13 ENCOUNTER — Other Ambulatory Visit: Payer: Self-pay | Admitting: Family Medicine

## 2016-07-13 DIAGNOSIS — K922 Gastrointestinal hemorrhage, unspecified: Secondary | ICD-10-CM | POA: Diagnosis present

## 2016-07-13 DIAGNOSIS — Z794 Long term (current) use of insulin: Secondary | ICD-10-CM | POA: Insufficient documentation

## 2016-07-13 DIAGNOSIS — D649 Anemia, unspecified: Principal | ICD-10-CM | POA: Diagnosis present

## 2016-07-13 DIAGNOSIS — Z8546 Personal history of malignant neoplasm of prostate: Secondary | ICD-10-CM | POA: Insufficient documentation

## 2016-07-13 DIAGNOSIS — E119 Type 2 diabetes mellitus without complications: Secondary | ICD-10-CM | POA: Insufficient documentation

## 2016-07-13 DIAGNOSIS — Z85118 Personal history of other malignant neoplasm of bronchus and lung: Secondary | ICD-10-CM | POA: Insufficient documentation

## 2016-07-13 DIAGNOSIS — Z87891 Personal history of nicotine dependence: Secondary | ICD-10-CM | POA: Insufficient documentation

## 2016-07-13 DIAGNOSIS — IMO0001 Reserved for inherently not codable concepts without codable children: Secondary | ICD-10-CM

## 2016-07-13 DIAGNOSIS — Z79899 Other long term (current) drug therapy: Secondary | ICD-10-CM | POA: Insufficient documentation

## 2016-07-13 LAB — BASIC METABOLIC PANEL
Anion gap: 4 — ABNORMAL LOW (ref 5–15)
BUN: 18 mg/dL (ref 6–20)
CHLORIDE: 105 mmol/L (ref 101–111)
CO2: 27 mmol/L (ref 22–32)
CREATININE: 0.95 mg/dL (ref 0.61–1.24)
Calcium: 8.3 mg/dL — ABNORMAL LOW (ref 8.9–10.3)
GFR calc non Af Amer: >60 mL/min (ref >60)
Glucose, Bld: 112 mg/dL — ABNORMAL HIGH (ref 65–99)
POTASSIUM: 3.4 mmol/L — AB (ref 3.5–5.1)
SODIUM: 136 mmol/L (ref 135–145)

## 2016-07-13 LAB — GLUCOSE, CAPILLARY
GLUCOSE-CAPILLARY: 107 mg/dL — AB (ref 65–99)
GLUCOSE-CAPILLARY: 116 mg/dL — AB (ref 65–99)
Glucose-Capillary: 119 mg/dL — ABNORMAL HIGH (ref 65–99)

## 2016-07-13 LAB — CBC
HEMATOCRIT: 22.3 % — AB (ref 39.0–52.0)
HEMOGLOBIN: 7.6 g/dL — AB (ref 13.0–17.0)
MCH: 30.5 pg (ref 26.0–34.0)
MCHC: 34.1 g/dL (ref 30.0–36.0)
MCV: 89.6 fL (ref 78.0–100.0)
Platelets: 139 10*3/uL — ABNORMAL LOW (ref 150–400)
RBC: 2.49 MIL/uL — AB (ref 4.22–5.81)
RDW: 14.3 % (ref 11.5–15.5)
WBC: 8.1 10*3/uL (ref 4.0–10.5)

## 2016-07-13 MED ORDER — INSULIN ASPART 100 UNIT/ML ~~LOC~~ SOLN: 0.0000 [IU] | Freq: Three times a day (TID) | SUBCUTANEOUS | Status: DC

## 2016-07-13 MED ORDER — SENNA 8.6 MG PO TABS: 1.0000 | ORAL_TABLET | Freq: Two times a day (BID) | ORAL | Status: DC | PRN

## 2016-07-13 MED ORDER — AMOXICILLIN-POT CLAVULANATE 875-125 MG PO TABS
1.0000 | ORAL_TABLET | Freq: Two times a day (BID) | ORAL | Status: DC
  Administered 2016-07-13 – 2016-07-14 (×2): 1 via ORAL
  Filled 2016-07-13 (×2): qty 1

## 2016-07-13 MED ORDER — OCUVITE-LUTEIN PO CAPS
1.0000 | ORAL_CAPSULE | Freq: Two times a day (BID) | ORAL | Status: DC
  Administered 2016-07-13 – 2016-07-14 (×2): 1 via ORAL
  Filled 2016-07-13 (×2): qty 1

## 2016-07-13 MED ORDER — AMOXICILLIN-POT CLAVULANATE 875-125 MG PO TABS: 1.0000 | ORAL_TABLET | Freq: Two times a day (BID) | ORAL | 0 refills | Status: DC

## 2016-07-13 MED ORDER — DONEPEZIL HCL 5 MG PO TABS
5.0000 mg | ORAL_TABLET | Freq: Every day | ORAL | Status: DC
  Administered 2016-07-13: 5 mg via ORAL
  Filled 2016-07-13: qty 1

## 2016-07-13 MED ORDER — INSULIN GLARGINE 100 UNIT/ML ~~LOC~~ SOLN
12.0000 [IU] | Freq: Every day | SUBCUTANEOUS | Status: DC
  Administered 2016-07-13: 12 [IU] via SUBCUTANEOUS
  Filled 2016-07-13 (×2): qty 0.12

## 2016-07-13 MED ORDER — OXYBUTYNIN CHLORIDE ER 5 MG PO TB24
10.0000 mg | ORAL_TABLET | Freq: Every day | ORAL | Status: DC
  Administered 2016-07-13: 10 mg via ORAL
  Filled 2016-07-13: qty 2

## 2016-07-13 MED ORDER — HYDROCODONE-ACETAMINOPHEN 5-325 MG PO TABS
1.0000 | ORAL_TABLET | Freq: Four times a day (QID) | ORAL | Status: DC | PRN
Start: 2016-07-13 — End: 2016-07-14

## 2016-07-13 MED ORDER — ATORVASTATIN CALCIUM 40 MG PO TABS
40.0000 mg | ORAL_TABLET | Freq: Every day | ORAL | Status: DC
  Administered 2016-07-14: 40 mg via ORAL
  Filled 2016-07-13: qty 1

## 2016-07-13 MED ORDER — IPRATROPIUM-ALBUTEROL 0.5-2.5 (3) MG/3ML IN SOLN: 3.0000 mL | Freq: Four times a day (QID) | RESPIRATORY_TRACT | Status: DC | PRN

## 2016-07-13 MED ORDER — ONDANSETRON HCL 4 MG/2ML IJ SOLN: 4.0000 mg | Freq: Once | INTRAMUSCULAR | Status: DC

## 2016-07-13 MED ORDER — FERROUS SULFATE 325 (65 FE) MG PO TABS
325.0000 mg | ORAL_TABLET | Freq: Every day | ORAL | Status: DC
  Administered 2016-07-14: 325 mg via ORAL
  Filled 2016-07-13: qty 1

## 2016-07-13 MED ORDER — PANTOPRAZOLE SODIUM 40 MG PO TBEC
40.0000 mg | DELAYED_RELEASE_TABLET | Freq: Two times a day (BID) | ORAL | Status: DC
  Administered 2016-07-14: 40 mg via ORAL
  Filled 2016-07-13: qty 1

## 2016-07-13 MED ORDER — ACETAMINOPHEN 500 MG PO TABS: 1000.0000 mg | ORAL_TABLET | Freq: Three times a day (TID) | ORAL | Status: DC | PRN

## 2016-07-13 MED ORDER — VITAMIN D (ERGOCALCIFEROL) 1.25 MG (50000 UNIT) PO CAPS: 50000.0000 [IU] | ORAL_CAPSULE | ORAL | Status: DC

## 2016-07-13 MED ORDER — AMOXICILLIN-POT CLAVULANATE 875-125 MG PO TABS
1.0000 | ORAL_TABLET | Freq: Two times a day (BID) | ORAL | Status: DC
  Administered 2016-07-13: 1 via ORAL
  Filled 2016-07-13: qty 1

## 2016-07-13 MED ORDER — PANTOPRAZOLE SODIUM 40 MG IV SOLR: 40.0000 mg | Freq: Once | INTRAVENOUS | Status: DC

## 2016-07-13 MED ORDER — LORAZEPAM 2 MG/ML IJ SOLN
1.0000 mg | Freq: Once | INTRAMUSCULAR | Status: DC
  Filled 2016-07-13: qty 1

## 2016-07-13 MED ORDER — MOMETASONE FURO-FORMOTEROL FUM 200-5 MCG/ACT IN AERO
2.0000 | INHALATION_SPRAY | Freq: Two times a day (BID) | RESPIRATORY_TRACT | Status: DC
  Administered 2016-07-14: 2 via RESPIRATORY_TRACT
  Filled 2016-07-13: qty 8.8

## 2016-07-13 MED ORDER — CITALOPRAM HYDROBROMIDE 20 MG PO TABS
10.0000 mg | ORAL_TABLET | Freq: Every day | ORAL | Status: DC
  Administered 2016-07-14: 10 mg via ORAL
  Filled 2016-07-13: qty 1

## 2016-07-13 MED ORDER — HALOPERIDOL LACTATE 5 MG/ML IJ SOLN
5.0000 mg | Freq: Once | INTRAMUSCULAR | Status: AC
  Administered 2016-07-13: 5 mg via INTRAMUSCULAR
  Filled 2016-07-13: qty 1

## 2016-07-13 MED ORDER — TIOTROPIUM BROMIDE MONOHYDRATE 18 MCG IN CAPS
18.0000 ug | ORAL_CAPSULE | Freq: Every day | RESPIRATORY_TRACT | Status: DC
  Administered 2016-07-14: 18 ug via RESPIRATORY_TRACT
  Filled 2016-07-13: qty 5

## 2016-07-13 MED ORDER — PANTOPRAZOLE SODIUM 40 MG PO TBEC: 40.0000 mg | DELAYED_RELEASE_TABLET | Freq: Two times a day (BID) | ORAL | 1 refills | Status: DC

## 2016-07-13 MED ORDER — DILTIAZEM HCL ER COATED BEADS 240 MG PO CP24
240.0000 mg | ORAL_CAPSULE | Freq: Every day | ORAL | Status: DC
  Administered 2016-07-14: 240 mg via ORAL
  Filled 2016-07-13: qty 1

## 2016-07-13 MED ORDER — ISOSORBIDE MONONITRATE ER 60 MG PO TB24
30.0000 mg | ORAL_TABLET | Freq: Every day | ORAL | Status: DC
  Administered 2016-07-14: 30 mg via ORAL
  Filled 2016-07-13: qty 1

## 2016-07-13 MED ORDER — SODIUM CHLORIDE 0.9 % IV BOLUS (SEPSIS): 1000.0000 mL | Freq: Once | INTRAVENOUS | Status: DC

## 2016-07-13 MED ORDER — INSULIN ASPART 100 UNIT/ML ~~LOC~~ SOLN: 0.0000 [IU] | Freq: Every day | SUBCUTANEOUS | Status: DC

## 2016-07-13 NOTE — H&P (Signed)
INTERIM READMISSION NOTE:  83 yo patient whom I admitted for HCAP and GI Bleed, stable for discharge today to [facility] as he no longer has any bleeding, and had EGD documenting GE  Jx ulcer, and AVM in the stomach by Dr Dereck Leep, no longer bleeding.  He was discharge to [facility] with discharge summary and normal process.  Marie at [facility] refused admission, stating H/H was low and request repeating H and H.  She spoke with the physician on call at [facility].  I spoke again with her and the physician, and he said that it was OK, now that he understand the work up.  After speaking with the accepting physician, we (including charge nurse Deneise Lever) called EMS back again to transport him.  At 9pm tonight, I was called Rip Harbour (charge nurse in the ER) that [facility] again refused to accept him after being told that agreement between me and the accepting physician earlier that he was finally accepted.  In the interest of the patient, I will direct admit him back and repeat a CBC in the morning as required by the staff at [facility].  Will check another Hb tonight, and if less than 7.0 g per dL, will transfuse 1 unit.  I will round on him in the morning.  Orvan Falconer MD [facility].

## 2016-07-13 NOTE — Discharge Summary (Signed)
Physician Discharge Summary  Thomas [name] WNI:[id number] DOB: 06/19/1927 DOA: 07/10/2016  PCP: Redmond School, MD  Admit date: 07/10/2016 Discharge date: 07/13/2016  Admitted From: SNF Disposition: SNF.  Recommendations for Outpatient Follow-up:  1. Follow up with PCP in 1-2 weeks 2. Follow up with GI as recommended.   Home Health: None.  Equipment/Devices: None.  Discharge Condition: Anemia, but stable Hb.  No SOB, or coughs.   CODE STATUS:FULL CODE.  Diet recommendation: as tolerated, carb modified diet.   Brief/Interim Summary: Patient was admitted for GI bleed and HCAP by me on July 10, 2016.  As per my prior H and P:  " Thomas [name] a 83 y.o.malewith medical history of hypertension, diabetes mellitus, lung cancer status post resection, COPD, anxiety, coronary artery disease, recent admission for ORIF, SNF resident, presented to the ER with dark coffee ground emesis.  He also has some coughs, and has no abdominal pain, black or bloody stool.  Evaluation in the ER showed CXR with infiltrate, Hb of 12.2 grams per dL, and leukocytosis with WBC of 21K.  His stool guaic is negative.  He was given IV Van/Cefepime for HCAP, and hospitalist was asked to admit him for further work up.   HOSPITALIST:  Patient was admitted orignally to the floor, as it was thought he had hemoptysis from his PNA, and not hematemesis.  He was started on IV Van/Zosyn.  That evening, he has clear and significant hematemesis, and was transferred to the ICU, given transfusion support, and IVF support.  He responded promptly, and stabalized quickly in the ICU.  GI was consulted, and Dr Dereck Leep saw him, and performed an EGD on him which show non bleeding GE junction ulcer, along with AVM.  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  His IV protonix drip was eventually switch to IV PPI, and he will be discharged on oral PPI.  He should be on BID protonix for 12 weeks, then once daily thereafter.  For his PNA, he improved as well.  He will  finish a 10 day course of Augmentin.  He expressed agitation and did not want to go back to [facility].  Social worker was consulted, and found that he is a ward of the state, and cannot make decision.  Family is agreeable for him to go back.  He lives in a lock unit there.  Will give IM Haldol if he is too agitated, and he is obligated to be transferred there.  Obviously, no NSAIDS and no ASA.  He will follow up with PCP and GI as recommended.  Thank you and Good Day.    Discharge Diagnoses:  Principal Problem:   HCAP (healthcare-associated pneumonia) Active Problems:   Insulin dependent diabetes mellitus ([place])   Right bundle branch block with left anterior fascicular block   COPD (chronic obstructive pulmonary disease) ([place])   Type 2 diabetes mellitus with hyperglycemia, with long-term current use of insulin (H[facility])   Hemoptysis   Hematemesis   UGIB (upper gastrointestinal bleed)    Discharge Instructions  Discharge Instructions    Diet - low sodium heart healthy    Complete by:  As directed    Increase activity slowly    Complete by:  As directed      Allergies as of 07/13/2016      Reactions   No Known Allergies       Medication List    TAKE these medications   acetaminophen 500 MG tablet Commonly known as:  TYLENOL Take  1,000 mg by mouth every 8 (eight) hours as needed for moderate pain.   amoxicillin-clavulanate 875-125 MG tablet Commonly known as:  AUGMENTIN Take 1 tablet by mouth every 12 (twelve) hours.     atorvastatin 40 MG tablet Commonly known as:  LIPITOR Take 40 mg by mouth daily.   budesonide-formoterol 160-4.5 MCG/ACT inhaler Commonly known as:  SYMBICORT Inhale 2 puffs into the lungs 2 (two) times daily.   citalopram 10 MG tablet Commonly known as:  CELEXA Take 1 tablet (10 mg total) by mouth daily.   diltiazem 240 MG 24 hr capsule Commonly known as:  CARDIZEM CD TAKE ONE CAPSULE ORALLY EVERY MORNING.   donepezil 5 MG tablet Commonly known as:   ARICEPT Take 5 mg by mouth at bedtime.   ferrous sulfate 325 (65 FE) MG tablet Take 325 mg by mouth daily with breakfast.   HYDROcodone-acetaminophen 5-325 MG tablet Commonly known as:  NORCO Take 1-2 tablets by mouth every 6 (six) hours as needed for moderate pain.   insulin glargine 100 UNIT/ML injection Commonly known as:  LANTUS Inject 12 Units into the skin at bedtime.   ipratropium-albuterol 0.5-2.5 (3) MG/3ML Soln Commonly known as:  DUONEB Take 3 mLs by nebulization every 6 (six) hours as needed (dyspnea cough/wheezing).   isosorbide mononitrate 30 MG 24 hr tablet Commonly known as:  IMDUR TAKE ONE TABLET BY MOUTH ONCE DAILY.   omeprazole 20 MG capsule Commonly known as:  PRILOSEC Take 1 capsule (20 mg total) by mouth daily. For gastric protection While taking anti inflammatory medicine daily   ONE TOUCH ULTRA TEST test strip Generic drug:  glucose blood   oxybutynin 10 MG 24 hr tablet Commonly known as:  DITROPAN-XL Take 10 mg by mouth at bedtime.   pantoprazole 40 MG tablet Commonly known as:  PROTONIX Take 1 tablet (40 mg total) by mouth 2 (two) times daily.   PRESERVISION AREDS Caps Take 1 capsule by mouth 2 (two) times daily.   senna 8.6 MG Tabs tablet Commonly known as:  SENOKOT Take 1 tablet (8.6 mg total) by mouth 2 (two) times daily as needed for mild constipation.   tiotropium 18 MCG inhalation capsule Commonly known as:  SPIRIVA Place 18 mcg into inhaler and inhale daily.   Vitamin D (Ergocalciferol) 50000 units Caps capsule Commonly known as:  DRISDOL Take 50,000 Units by mouth every 7 (seven) days.       Allergies  Allergen Reactions  . No Known Allergies     Consultations:  GI   Procedures/Studies: Dg Chest Portable 1 View  Result Date: 07/10/2016 CLINICAL DATA:  History of lung carcinoma.  Hematemesis. EXAM: PORTABLE CHEST 1 VIEW COMPARISON:  May 03, 2016 FINDINGS: There is airspace consolidation in the mid and lower lung  zones, superimposed on chronic atelectasis and scarring in the right mid and lower lung zones. There is also scarring in the upper lobes bilaterally, stable. No edema or consolidation is evident on the left. There is postoperative change on the left. Heart size is within normal limits. Pulmonary vascular is within normal limits. No adenopathy evident. There is aortic atherosclerosis. Patient is status post coronary artery bypass grafting. No bone lesions. IMPRESSION: Airspace opacity felt to represent pneumonia in the right mid and lower lung zones. There is underlying scarring and volume loss in the right base. There is scarring in each upper lobe. There is postoperative change on the left, stable. No consolidation on the left. Stable cardiac silhouette. There is aortic atherosclerosis.  Electronically Signed   By: Lowella Grip III M.D.   On: 07/10/2016 07:18   Dg Abd Portable 2 Views  Result Date: 07/10/2016 CLINICAL DATA:  Hematemesis EXAM: PORTABLE ABDOMEN - 2 VIEW COMPARISON:  CT abdomen and pelvis January 21, 2013 FINDINGS: There is moderate stool in the colon. There is no bowel dilatation or air-fluid level to suggest bowel obstruction. No free air. There are surgical clips the pelvis. Patient has had kyphoplasty procedure at L1. There are also surgical clips in the gallbladder fossa region as well as in the upper abdomen just to the left of midline. IMPRESSION: Moderate stool in colon. No bowel obstruction or free air. Multiple areas of postoperative change noted. Electronically Signed   By: Lowella Grip III M.D.   On: 07/10/2016 07:19   Ct Angio Abd/pel W/ And/or W/o  Result Date: 07/10/2016 CLINICAL DATA:  Hematemesis EXAM: CTA ABDOMEN AND PELVIS WITHOUT AND WITH CONTRAST TECHNIQUE: Multidetector CT imaging of the abdomen and pelvis was performed using the standard protocol during bolus administration of intravenous contrast. Multiplanar reconstructed images and MIPs were obtained and  reviewed to evaluate the vascular anatomy. CONTRAST:  100 cc Isovue 370 IV COMPARISON:  01/21/2013 CT FINDINGS: VASCULAR Aorta: Normal caliber aorta without aneurysm, dissection, vasculitis or significant stenosis. Mild atherosclerosis of the abdominal aorta some which is calcified. Celiac: The origin of the celiac axis may be stenotic and not well opacified. The splenic and common hepatic arteries are patent without significant stenosis. Left gastric is not well visualized. A tangle of vessels are seen in the mid abdomen similar to previous study from 2014 some which is due to tortuous SMA and branches of the celiac. The possibility of a vascular malformation is not entirely excluded. SMA: Patent without evidence of aneurysm, dissection, vasculitis or significant stenosis. Tortuous without occlusion. Renals: Both renal arteries are patent without evidence of aneurysm, dissection, vasculitis, fibromuscular dysplasia or significant stenosis. There is mild calcific atherosclerosis of the proximal right renal artery. IMA: Patent without evidence of aneurysm, dissection, vasculitis or significant stenosis. Inflow: Atherosclerotic calcifications of the common iliac arteries. No aneurysm, dissection or vasculitis. No significant stenosis. Proximal Outflow: Atherosclerosis of the common femoral arteries without significant stenosis, aneurysm or dissection. The visualized portions of the superficial profunda arteries are patent. Veins: No obvious venous abnormality within the limitations of this arterial phase study. Review of the MIP images confirms the above findings. NON-VASCULAR Lower chest: The visualized cardiac chambers are normal. Trace bilateral pleural effusions with scarring at the right lung base. Small foci of airspace opacity are seen in the right lower lobe. Aspiration is not excluded. Hepatobiliary: Cholecystectomy. No enhancing liver lesions. Small granuloma in the right hepatic lobe. Pancreas: Scattered  pancreatic calcifications consistent with chronic pancreatitis. Spleen: Normal size spleen with splenic calcifications consistent with granulomatous disease. Adrenals/Urinary Tract: Normal bilateral adrenal glands. Multiple right-sided renal cysts are again noted the largest is currently 7.1 cm versus 6.7 cm on 11/13/2012. Stomach/Bowel: The stomach is physiologically distended. Fluid and ingested material is seen along the dependent aspect of the stomach which can obscure ulcerations and masses. No definite mural thickening. There is normal small bowel rotation. Small intestine is unremarkable. Moderate fecal residue within large bowel without inflammation or obstruction. There is distal descending and sigmoid diverticulosis without acute diverticulitis. Normal-appearing appendix. Lymphatic: No mesenteric, retroperitoneal, pelvic side wall or inguinal lymphadenopathy. Reproductive: Status post prostate surgery and lymph node dissection with surgical clips in the prostatic bed. Penile implant present. Other: No  free air nor free fluid. Musculoskeletal: Chronic compression of L1 with vertebral augmentation. Degenerative disc disease at L5-S1. Left femoral nail fixation. IMPRESSION: VASCULAR Tangle of vessels in the mid abdomen with tortuous appearing branches off the celiac axis and SMA contributing to this appearance. The possibility of collateralized vessels due to stenosis or potentially an unusual vascular malformation might account for this. No findings of an aortoenteric fistula. Critical Value/emergent results were called by telephone at the time of interpretation on 07/10/2016 at 11:46 pm to Dr. Hildred Laser , who verbally acknowledged these results. NON-VASCULAR 1. Trace bilateral pleural effusions with scarring at the right lung base. Minimal airspace opacities at the right lung base are noted for which aspiration is not entirely excluded. 2. Stigmata of chronic pancreatitis . 3. Splenic and hepatic  granulomas. 4. The stomach wall does not appear thickened. The posterior wall is obscured by ingested material and fluid and therefore cannot be adequately assessed. No bowel perforation is noted. 5. Right-sided renal cysts. 6. Status post cholecystectomy. 7. Sigmoid diverticulosis without acute diverticulitis. Electronically Signed   By: Ashley Royalty M.D.   On: 07/10/2016 23:47     Discharge Exam: Vitals:   07/12/16 2100 07/13/16 0510  BP: (!) 95/44 (!) 102/47  Pulse: 87 74  Resp: 20 15  Temp: 98.7 F (37.1 C) 99.2 F (37.3 C)   Vitals:   07/12/16 1954 07/12/16 2100 07/13/16 0510 07/13/16 0750  BP:  (!) 95/44 (!) 102/47   Pulse: 84 87 74   Resp: 16 20 15    Temp:  98.7 F (37.1 C) 99.2 F (37.3 C)   TempSrc:  Oral Oral   SpO2: 96% 100% 100% 91%  Weight:      Height:        General: Pt is alert, awake, not in acute distress Cardiovascular: RRR, S1/S2 +, no rubs, no gallops Respiratory: CTA bilaterally, no wheezing, no rhonchi Abdominal: Soft, NT, ND, bowel sounds + Extremities: no edema, no cyanosis    The results of significant diagnostics from this hospitalization (including imaging, microbiology, ancillary and laboratory) are listed below for reference.     Microbiology: Recent Results (from the past 240 hour(s))  Blood culture (routine x 2)     Status: None (Preliminary result)   Collection Time: 07/10/16  8:08 AM  Result Value Ref Range Status   Specimen Description RIGHT ANTECUBITAL  Final   Special Requests   Final    BOTTLES DRAWN AEROBIC AND ANAEROBIC Blood Culture adequate volume   Culture NO GROWTH 2 DAYS  Final   Report Status PENDING  Incomplete  Blood culture (routine x 2)     Status: None (Preliminary result)   Collection Time: 07/10/16  8:08 AM  Result Value Ref Range Status   Specimen Description BLOOD RIGHT ARM  Final   Special Requests   Final    BOTTLES DRAWN AEROBIC AND ANAEROBIC Blood Culture adequate volume   Culture NO GROWTH 2 DAYS  Final    Report Status PENDING  Incomplete  MRSA PCR Screening     Status: None   Collection Time: 07/10/16 10:18 AM  Result Value Ref Range Status   MRSA by PCR NEGATIVE NEGATIVE Final    Comment:        The GeneXpert MRSA Assay (FDA approved for NASAL specimens only), is one component of a comprehensive MRSA colonization surveillance program. It is not intended to diagnose MRSA infection nor to guide or monitor treatment for MRSA infections.   MRSA  PCR Screening     Status: None   Collection Time: 07/10/16  8:00 PM  Result Value Ref Range Status   MRSA by PCR NEGATIVE NEGATIVE Final    Comment:        The GeneXpert MRSA Assay (FDA approved for NASAL specimens only), is one component of a comprehensive MRSA colonization surveillance program. It is not intended to diagnose MRSA infection nor to guide or monitor treatment for MRSA infections.      Labs: BNP (last 3 results) No results for input(s): BNP in the last 8760 hours. Basic Metabolic Panel:  Recent Labs Lab 07/10/16 0638 07/10/16 0646 07/11/16 0327 07/13/16 0545  NA 141 142 141 136  K 4.3 4.4 3.9 3.4*  CL 109 107 117* 105  CO2 25  --  22 27  GLUCOSE 177* 169* 339* 112*  BUN 27* 29* 41* 18  CREATININE 1.14 1.20 0.93 0.95  CALCIUM 8.8*  --  7.4* 8.3*   Liver Function Tests:  Recent Labs Lab 07/10/16 0638 07/11/16 0327  AST 18 11*  ALT 13* 8*  ALKPHOS 76 42  BILITOT 0.8 1.7*  PROT 5.9* 3.8*  ALBUMIN 3.3* 2.1*    Recent Labs Lab 07/10/16 0638  LIPASE 33   No results for input(s): AMMONIA in the last 168 hours. CBC:  Recent Labs Lab 07/11/16 0803  07/11/16 1457 07/11/16 1950 07/12/16 0006 07/12/16 0452 07/13/16 0545  WBC 13.0*  --  11.5*  --  9.6 9.5 8.1  HGB 9.6*  < > 9.8* 8.9* 8.7* 8.8* 7.6*  HCT 27.9*  < > 28.6* 26.4* 25.7* 25.8* 22.3*  MCV 90.0  --  89.4  --  89.9 89.9 89.6  PLT 146*  --  135*  --  133* 133* 139*  < > = values in this interval not displayed. Cardiac Enzymes: No  results for input(s): CKTOTAL, CKMB, CKMBINDEX, TROPONINI in the last 168 hours. BNP: Invalid input(s): POCBNP CBG:  Recent Labs Lab 07/12/16 1142 07/12/16 1643 07/12/16 2103 07/13/16 0749 07/13/16 1140  GLUCAP 123* 94 141* 107* 116*   D-Dimer No results for input(s): DDIMER in the last 72 hours. Hgb A1c No results for input(s): HGBA1C in the last 72 hours. Lipid Profile No results for input(s): CHOL, HDL, LDLCALC, TRIG, CHOLHDL, LDLDIRECT in the last 72 hours. Thyroid function studies No results for input(s): TSH, T4TOTAL, T3FREE, THYROIDAB in the last 72 hours.  Invalid input(s): FREET3 Anemia work up No results for input(s): VITAMINB12, FOLATE, FERRITIN, TIBC, IRON, RETICCTPCT in the last 72 hours. Urinalysis    Component Value Date/Time   COLORURINE YELLOW 05/03/2016 1331   APPEARANCEUR CLEAR 05/03/2016 1331   LABSPEC 1.018 05/03/2016 1331   PHURINE 6.0 05/03/2016 1331   GLUCOSEU NEGATIVE 05/03/2016 1331   HGBUR NEGATIVE 05/03/2016 1331   BILIRUBINUR NEGATIVE 05/03/2016 1331   KETONESUR NEGATIVE 05/03/2016 1331   PROTEINUR NEGATIVE 05/03/2016 1331   UROBILINOGEN 0.2 11/13/2012 1238   NITRITE NEGATIVE 05/03/2016 1331   LEUKOCYTESUR NEGATIVE 05/03/2016 1331   Sepsis Labs Invalid input(s): PROCALCITONIN,  WBC,  LACTICIDVEN Microbiology Recent Results (from the past 240 hour(s))  Blood culture (routine x 2)     Status: None (Preliminary result)   Collection Time: 07/10/16  8:08 AM  Result Value Ref Range Status   Specimen Description RIGHT ANTECUBITAL  Final   Special Requests   Final    BOTTLES DRAWN AEROBIC AND ANAEROBIC Blood Culture adequate volume   Culture NO GROWTH 2 DAYS  Final  Report Status PENDING  Incomplete  Blood culture (routine x 2)     Status: None (Preliminary result)   Collection Time: 07/10/16  8:08 AM  Result Value Ref Range Status   Specimen Description BLOOD RIGHT ARM  Final   Special Requests   Final    BOTTLES DRAWN AEROBIC AND  ANAEROBIC Blood Culture adequate volume   Culture NO GROWTH 2 DAYS  Final   Report Status PENDING  Incomplete  MRSA PCR Screening     Status: None   Collection Time: 07/10/16 10:18 AM  Result Value Ref Range Status   MRSA by PCR NEGATIVE NEGATIVE Final    Comment:        The GeneXpert MRSA Assay (FDA approved for NASAL specimens only), is one component of a comprehensive MRSA colonization surveillance program. It is not intended to diagnose MRSA infection nor to guide or monitor treatment for MRSA infections.   MRSA PCR Screening     Status: None   Collection Time: 07/10/16  8:00 PM  Result Value Ref Range Status   MRSA by PCR NEGATIVE NEGATIVE Final    Comment:        The GeneXpert MRSA Assay (FDA approved for NASAL specimens only), is one component of a comprehensive MRSA colonization surveillance program. It is not intended to diagnose MRSA infection nor to guide or monitor treatment for MRSA infections.      Time coordinating discharge: Over 30 minutes  SIGNED:   Orvan Falconer, MD FACP [facility] 07/13/2016, 12:11 PM   If 7PM-7AM, please contact night-coverage [email or web address] Password [email or web address]

## 2016-07-13 NOTE — Clinical Social Work Note (Addendum)
Pt is ready for discharge today and will return to [facility]. CSW sent clinicals to [facility] and confirmed pt's return with Lynnae Sandhoff in admissions at [phone]. CSW provided RNJoyce with report information and added to treatment team sticky note. CSW called [facility] [phone]) and left a message with Gae Bon on the [facility] answering service. RN to arrange transportation with [facility]. CSW is signing off as no further needs identified.  Oretha Ellis, [place], [place] Work  [phone]

## 2016-07-13 NOTE — Progress Notes (Signed)
Pt removed PIV.  MD aware.

## 2016-07-13 NOTE — Progress Notes (Signed)
Patient ID: Thomas [name], male   DOB: [birth year], 83 y.o.   MRN: [id number]  Assessment/Plan: ADMITTED WITH HEMATEMESIS-EGD REVEALED ESOPHAGEAL ULCERS. PT DENIES ODYNOPHAGIA.  PLAN: 1. SUPPORTIVE CARE 2. BID PPI X 3 MOS THEN DAILY 3. Will SIGN OFF. PLEASE CALL WITH QUESTIONS. 4. FOLLOW UP WITH DR. Laural Golden IN 4-6 WEEKS.   Subjective: Since last evaluated the patient  HAS No questions or concerns. Wants to go home or to [facility]. NO BRBPR OR MELENA.  Objective: Vital signs in last 24 hours: Vitals:   07/12/16 2100 07/13/16 0510  BP: (!) 95/44 (!) 102/47  Pulse: 87 74  Resp: 20 15  Temp: 98.7 F (37.1 C) 99.2 F (37.3 C)   General appearance: alert, cooperative and no distress Resp: clear to auscultation bilaterally Cardio: regular rate and rhythm GI: soft, non-tender; bowel sounds normal;   Lab Results:  Cr 0.95 Hb 7.6 PLT CT 139    Studies/Results: No results found.  Medications: I have reviewed the patient's current medications.   LOS: 5 days   Barney Drain 07/15/2013, 2:23 PM

## 2016-07-14 DIAGNOSIS — D649 Anemia, unspecified: Secondary | ICD-10-CM | POA: Diagnosis not present

## 2016-07-14 LAB — BPAM RBC
BLOOD PRODUCT EXPIRATION DATE: 201806162359
BLOOD PRODUCT EXPIRATION DATE: 201806232359
BLOOD PRODUCT EXPIRATION DATE: 201806232359
Blood Product Expiration Date: 201806152359
ISSUE DATE / TIME: 201806070021
ISSUE DATE / TIME: 201806062049
UNIT TYPE AND RH: 5100
Unit Type and Rh: 5100
Unit Type and Rh: 5100
Unit Type and Rh: 5100

## 2016-07-14 LAB — CBC
HCT: 23.4 % — ABNORMAL LOW (ref 39.0–52.0)
HCT: 23.5 % — ABNORMAL LOW (ref 39.0–52.0)
Hemoglobin: 7.9 g/dL — ABNORMAL LOW (ref 13.0–17.0)
Hemoglobin: 8 g/dL — ABNORMAL LOW (ref 13.0–17.0)
MCH: 30.3 pg (ref 26.0–34.0)
MCH: 30.2 pg (ref 26.0–34.0)
MCHC: 34 g/dL (ref 30.0–36.0)
MCHC: 33.8 g/dL (ref 30.0–36.0)
MCV: 89 fL (ref 78.0–100.0)
MCV: 89.3 fL (ref 78.0–100.0)
PLATELETS: 170 10*3/uL (ref 150–400)
PLATELETS: 177 10*3/uL (ref 150–400)
RBC: 2.64 MIL/uL — ABNORMAL LOW (ref 4.22–5.81)
RBC: 2.62 MIL/uL — AB (ref 4.22–5.81)
RDW: 13.9 % (ref 11.5–15.5)
RDW: 14.1 % (ref 11.5–15.5)
WBC: 6.3 10*3/uL (ref 4.0–10.5)
WBC: 6.6 10*3/uL (ref 4.0–10.5)

## 2016-07-14 LAB — TYPE AND SCREEN
ABO/RH(D): B POS
Antibody Screen: NEGATIVE
UNIT DIVISION: 0
UNIT DIVISION: 0
UNIT DIVISION: 0
UNIT DIVISION: 0

## 2016-07-14 NOTE — Discharge Summary (Addendum)
DISCHARGE ADDENDUM:  This patient was discharged yesterday after speaking to the Medical Director at [facility] who had accepted the patient, after being declined by staff due to his anemia.  Despite the agreement between me and the physician on call, the LPN last evening unfortunately sent this patient back and would not accept him.  For the interest of this patient, I had to direct admit him from home last night, and checked his Hb again as requested by the staff.  His Hb went higher at 11pm to 7.9 g per dL, and this am, rose to 8g per dL without transfusion.  It was explained that EGD was performed, and that we knew the source of bleeding was the GE Jx ulcer, that no longer bled.  He is now being tx for HCAP with Augmentin. We will again attempt to discharge him back to [facility] where he came from.  Thank you,  Orvan Falconer MD FACP.  Alden Server, RN. Lilli Light, [facility]. Mal Amabile MD, Med. Director. Sherlyn Hay LPN.)

## 2016-07-14 NOTE — Clinical Social Work Note (Addendum)
Clinical Social Worker facilitated patient discharge including contacting patient family and facility [facility])  to confirm patient discharge plans. Clinical information (AVS) will be faxed to facility and family agreeable with plan. CSW arranged ambulance transport via [facility] to [facility]. RN to call report to [phone] (Fax# [phone]) prior to discharge.  Clinical Social Worker will sign off for now as social work intervention is no longer needed. Please consult [place] again if new need arises.  Lazar Tierce B. Joline Maxcy Clinical Social Work Dept Weekend Social Worker [phone] 9:59 AM

## 2016-07-14 NOTE — Progress Notes (Signed)
Patient has discharge orders to return to [facility]. Have called social worker on for weekend to assist in discharging patient. Awaiting call back from social work.

## 2016-07-14 NOTE — ED Notes (Signed)
Pt arrived to er by [facility] from [facility], EMS reported that pt was discharged from [facility] and that they had been taking pt back to facility but per Sherlyn Hay LPN [facility] is refusing to take pt due to his hgb, This RN contacted Lattie Haw LPN at [facility] who advised that they had spoken with Dr Rosey Bath who is their Market researcher and that they are both refusing to take pt, This RN spoke with Dr Marin Comment who advised that he would make pt a direct admit until am.

## 2016-07-14 NOTE — Progress Notes (Signed)
Called social worker J. Aundra Dubin who helped with d/c yesterday and asked for a assistance in d/c patient today. Left a voice mail and am waiting on call back.

## 2016-07-14 NOTE — Clinical Social Work Placement (Signed)
   CLINICAL SOCIAL WORK PLACEMENT  NOTE  Date:  07/14/2016  Patient Details  Name: MOISHE SCHELLENBERG MRN: [id number] Date of Birth: Apr 14, 1927  Clinical Social Work is seeking post-discharge placement for this patient at the [place] level of care (*CSW will initial, date and re-position this form in  chart as items are completed):  Yes   Patient/family provided with [place] Work Department's list of facilities offering this level of care within the geographic area requested by the patient (or if unable, by the patient's family).  Yes   Patient/family informed of their freedom to choose among providers that offer the needed level of care, that participate in Medicare, Medicaid or managed care program needed by the patient, have an available bed and are willing to accept the patient.  Yes   Patient/family informed of [place]'s ownership interest in [facility] and [facility], as well as of the fact that they are under no obligation to receive care at these facilities.  PASRR submitted to EDS on       PASRR number received on       Existing PASRR number confirmed on 07/14/16     FL2 transmitted to all facilities in geographic area requested by pt/family on       FL2 transmitted to all facilities within larger geographic area on       Patient informed that his/her managed care company has contracts with or will negotiate with certain facilities, including the following:            Patient/family informed of bed offers received.  Patient chooses bed at  (Pt from [facility])     Physician recommends and patient chooses bed at      Patient to be transferred to  (Pt from [place]) on 07/14/16.  Patient to be transferred to facility by  Karen Chafe)     Patient family notified on 07/14/16 of transfer.  Name of family member notified:   (Spouse)     PHYSICIAN       Additional Comment:     _______________________________________________ Serafina Mitchell, LCSWA 07/14/2016, 10:09 AM

## 2016-07-14 NOTE — Clinical Social Work Placement (Deleted)
   CLINICAL SOCIAL WORK PLACEMENT  NOTE  Date:  07/14/2016  Patient Details  Name: Thomas [name] MRN: [id number] Date of Birth: 1927/06/30  Clinical Social Work is seeking post-discharge placement for this patient at the [place] level of care (*CSW will initial, date and re-position this form in  chart as items are completed):  Yes   Patient/family provided with [place] Work Department's list of facilities offering this level of care within the geographic area requested by the patient (or if unable, by the patient's family).  Yes   Patient/family informed of their freedom to choose among providers that offer the needed level of care, that participate in Medicare, Medicaid or managed care program needed by the patient, have an available bed and are willing to accept the patient.  Yes   Patient/family informed of [place]'s ownership interest in [facility] and [facility], as well as of the fact that they are under no obligation to receive care at these facilities.  PASRR submitted to EDS on       PASRR number received on       Existing PASRR number confirmed on 07/14/16     FL2 transmitted to all facilities in geographic area requested by pt/family on       FL2 transmitted to all facilities within larger geographic area on       Patient informed that his/her managed care company has contracts with or will negotiate with certain facilities, including the following:            Patient/family informed of bed offers received.  Patient chooses bed at  (Pt from [facility])     Physician recommends and patient chooses bed at      Patient to be transferred to  (Pt from [place]) on 07/14/16.  Patient to be transferred to facility by  Corey Harold)     Patient family notified on 07/14/16 of transfer.  Name of family member notified:   (Spouse)     PHYSICIAN       Additional Comment:     _______________________________________________ Serafina Mitchell, [place] 07/14/2016, 10:06 AM

## 2016-07-14 NOTE — Progress Notes (Signed)
PT refusing to keep SCDs on. Removed from BLE due to agitation and getting up out of bed to pull on the SCDs.

## 2016-07-14 NOTE — Progress Notes (Signed)
Patient transported by EMS. IV removed and site intact. Gave report to [facility], [place].

## 2016-07-14 NOTE — Progress Notes (Signed)
Called report to [facility]. EMS has been called. Awaiting them to pick up patient and transport. IV removed and site intact.

## 2016-07-15 LAB — CULTURE, BLOOD (ROUTINE X 2)
Culture: NO GROWTH
Culture: NO GROWTH
SPECIAL REQUESTS: ADEQUATE
Special Requests: ADEQUATE

## 2016-07-15 LAB — GLUCOSE, CAPILLARY
Glucose-Capillary: 135 mg/dL — ABNORMAL HIGH (ref 65–99)
Glucose-Capillary: 93 mg/dL (ref 65–99)

## 2016-08-14 ENCOUNTER — Ambulatory Visit (INDEPENDENT_AMBULATORY_CARE_PROVIDER_SITE_OTHER): Payer: Medicare Other | Admitting: Internal Medicine

## 2016-08-14 ENCOUNTER — Encounter (INDEPENDENT_AMBULATORY_CARE_PROVIDER_SITE_OTHER): Payer: Self-pay | Admitting: Internal Medicine

## 2016-08-14 VITALS — BP 122/70 | HR 64 | Temp 97.6°F | Ht 71.0 in | Wt 146.0 lb

## 2016-08-14 DIAGNOSIS — K920 Hematemesis: Secondary | ICD-10-CM

## 2016-08-14 LAB — CBC WITH DIFFERENTIAL/PLATELET
BASOS ABS: 63 {cells}/uL (ref 0–200)
Basophils Relative: 1 %
Eosinophils Absolute: 441 cells/uL (ref 15–500)
Eosinophils Relative: 7 %
HCT: 35 % — ABNORMAL LOW (ref 38.5–50.0)
HEMOGLOBIN: 11.3 g/dL — AB (ref 13.2–17.1)
LYMPHS ABS: 1386 {cells}/uL (ref 850–3900)
LYMPHS PCT: 22 %
MCH: 29 pg (ref 27.0–33.0)
MCHC: 32.3 g/dL (ref 32.0–36.0)
MCV: 90 fL (ref 80.0–100.0)
MONO ABS: 693 {cells}/uL (ref 200–950)
MPV: 9.2 fL (ref 7.5–12.5)
Monocytes Relative: 11 %
NEUTROS PCT: 59 %
Neutro Abs: 3717 cells/uL (ref 1500–7800)
Platelets: 240 10*3/uL (ref 140–400)
RBC: 3.89 MIL/uL — ABNORMAL LOW (ref 4.20–5.80)
RDW: 14.6 % (ref 11.0–15.0)
WBC: 6.3 10*3/uL (ref 3.8–10.8)

## 2016-08-14 NOTE — Patient Instructions (Signed)
CBC today.  

## 2016-08-14 NOTE — Progress Notes (Addendum)
Subjective:    Patient ID: Thomas [name], male    DOB: Jun 25, 1927, 83 y.o.   MRN: [id number] Resident of [facility]. HPI Here today for f/u. Admitted to [facility] for vomiting blood.  H and H stable on admission. He did  have drop in his hemoglobin and received one unit of blood.  Caregiver in room. He says he is doing good. Patient is confused Underwent and EGD in June of th is year for hematemesis.   Caregiver in room but was not familiar with patient hx.   . He c/o shoulder pain today, He has a BM daily. (Hx obtained from chart) Denies seeing and blood in his stool.  Aspirin not listed on his med list.  Impression:               - Normal esophagus.                           - Non-bleeding esophageal ulcer at GEJ.                           - Z-line irregular, 38 cm from the incisors.                           - 2 cm hiatal hernia.                           - A small amount of food (residue) in the stomach.                           - Two non-bleeding angiodysplastic lesions in the                            stomach coagulated with cold probe.                           - Normal duodenal bulb and second portion of the                            duodenum.    6/78/2018 EGD: hematemesis:  Impression:               - Normal esophagus.                           - Non-bleeding esophageal ulcer at GEJ.                           - Z-line irregular, 38 cm from the incisors.                           - 2 cm hiatal hernia.                           - A small amount of food (residue) in the stomach.                           - Two non-bleeding angiodysplastic lesions in the  stomach coagulated with cold probe.                           - Normal duodenal bulb and second portion of the                            duodenum. Review of Systems Past Medical History:  Diagnosis Date  . Anginal pain ([place])   . Cancer ([place])   . Diabetes mellitus   . Hyperlipemia   . Lung  cancer ([place])   . Prostate cancer [facility])     Past Surgical History:  Procedure Laterality Date  . BACK SURGERY    . CARDIAC SURGERY    . ESOPHAGOGASTRODUODENOSCOPY N/A 07/11/2016   Procedure: ESOPHAGOGASTRODUODENOSCOPY (EGD);  Surgeon: Rogene Houston, MD;  Location[facility] ENDO [facility];  Service: Endoscopy;  Laterality: N/A;  . INTRAMEDULLARY (IM) NAIL INTERTROCHANTERIC Left 05/04/2016   Procedure: INTRAMEDULLARY (IM) NAIL INTERTROCHANTRIC;  Surgeon: Renette Butters, MD;  Location: [place];  Service: Orthopedics;  Laterality: Left;  . PROSTATE SURGERY    . surgery to repair aneurysm    . TUMOR REMOVAL     from lung    Allergies  Allergen Reactions  . No Known Allergies     Current Outpatient Prescriptions on File Prior to Visit  Medication Sig Dispense Refill  . acetaminophen (TYLENOL) 500 MG tablet Take 1,000 mg by mouth every 8 (eight) hours as needed for moderate pain.    Marland Kitchen atorvastatin (LIPITOR) 40 MG tablet Take 40 mg by mouth daily.    . budesonide-formoterol (SYMBICORT) 160-4.5 MCG/ACT inhaler Inhale 2 puffs into the lungs 2 (two) times daily.    . citalopram (CELEXA) 10 MG tablet Take 1 tablet (10 mg total) by mouth daily. 30 tablet 2  . diltiazem (CARDIZEM CD) 240 MG 24 hr capsule TAKE ONE CAPSULE ORALLY EVERY MORNING. 30 capsule 6  . donepezil (ARICEPT) 5 MG tablet Take 5 mg by mouth at bedtime.    . ferrous sulfate 325 (65 FE) MG tablet Take 325 mg by mouth daily with breakfast.    . HYDROcodone-acetaminophen (NORCO) 5-325 MG tablet Take 1-2 tablets by mouth every 6 (six) hours as needed for moderate pain. 40 tablet 0  . insulin glargine (LANTUS) 100 UNIT/ML injection Inject 12 Units into the skin at bedtime.    Marland Kitchen ipratropium-albuterol (DUONEB) 0.5-2.5 (3) MG/3ML SOLN Take 3 mLs by nebulization every 6 (six) hours as needed (dyspnea cough/wheezing).    . Multiple Vitamins-Minerals (PRESERVISION AREDS) CAPS Take 1 capsule by mouth 2 (two) times daily.    Marland Kitchen oxybutynin (DITROPAN-XL)  10 MG 24 hr tablet Take 10 mg by mouth at bedtime.    . pantoprazole (PROTONIX) 40 MG tablet Take 1 tablet (40 mg total) by mouth 2 (two) times daily. 60 tablet 1  . senna (SENOKOT) 8.6 MG TABS tablet Take 1 tablet (8.6 mg total) by mouth 2 (two) times daily as needed for mild constipation. 120 each 0  . tiotropium (SPIRIVA) 18 MCG inhalation capsule Place 18 mcg into inhaler and inhale daily.    . Vitamin D, Ergocalciferol, (DRISDOL) 50000 units CAPS capsule Take 50,000 Units by mouth every 7 (seven) days.    Marland Kitchen amoxicillin-clavulanate (AUGMENTIN) 875-125 MG tablet Take 1 tablet by mouth every 12 (twelve) hours. (Patient not taking: Reported on 08/14/2016) 20 tablet 0  . isosorbide mononitrate (IMDUR) 30 MG 24 hr tablet TAKE  ONE TABLET BY MOUTH ONCE DAILY. 30 tablet 6   Current Facility-Administered Medications on File Prior to Visit  Medication Dose Route Frequency Provider Last Rate Last Dose  . albuterol (PROVENTIL) (5 MG/ML) 0.5% nebulizer solution 2.5 mg  2.5 mg Nebulization Once Lendon Colonel, NP            Objective:   Physical Exam Blood pressure 122/70, pulse 64, temperature 97.6 F (36.4 C), height 5\' 11"  (1.803 m), weight 146 lb (66.2 kg).]\  Alert and oriented. Skin warm and dry. Oral mucosa is moist.   . Sclera anicteric, conjunctivae is pink. Thyroid not enlarged. No cervical lymphadenopathy. Lungs clear. Heart regular rate and rhythm.  Abdomen is soft. Bowel sounds are positive. No hepatomegaly. No abdominal masses felt. No tenderness.  No edema to lower extremities.         Assessment & Plan:  Hematemesis. Will get a CBC today. Patient is a poor historian, He has confusion.  Denies vomiting blood

## 2016-09-26 NOTE — Addendum Note (Signed)
Addendum  created 09/26/16 1202 by Roberts Gaudy, MD   Sign clinical note

## 2016-10-01 ENCOUNTER — Emergency Department (HOSPITAL_COMMUNITY): Payer: Medicare Other

## 2016-10-01 ENCOUNTER — Encounter (HOSPITAL_COMMUNITY): Payer: Self-pay | Admitting: *Deleted

## 2016-10-01 ENCOUNTER — Inpatient Hospital Stay (HOSPITAL_COMMUNITY)
Admission: EM | Admit: 2016-10-01 | Discharge: 2016-10-05 | DRG: 470 | Disposition: A | Discharge status: Skilled Nursing Facility | Payer: Medicare Other | Attending: Internal Medicine | Admitting: Internal Medicine

## 2016-10-01 DIAGNOSIS — I252 Old myocardial infarction: Secondary | ICD-10-CM

## 2016-10-01 DIAGNOSIS — Z9889 Other specified postprocedural states: Secondary | ICD-10-CM

## 2016-10-01 DIAGNOSIS — J449 Chronic obstructive pulmonary disease, unspecified: Secondary | ICD-10-CM | POA: Diagnosis present

## 2016-10-01 DIAGNOSIS — D649 Anemia, unspecified: Secondary | ICD-10-CM | POA: Diagnosis present

## 2016-10-01 DIAGNOSIS — S72001A Fracture of unspecified part of neck of right femur, initial encounter for closed fracture: Secondary | ICD-10-CM

## 2016-10-01 DIAGNOSIS — M25559 Pain in unspecified hip: Secondary | ICD-10-CM

## 2016-10-01 DIAGNOSIS — M25551 Pain in right hip: Secondary | ICD-10-CM | POA: Diagnosis not present

## 2016-10-01 DIAGNOSIS — E1151 Type 2 diabetes mellitus with diabetic peripheral angiopathy without gangrene: Secondary | ICD-10-CM | POA: Diagnosis present

## 2016-10-01 DIAGNOSIS — W010XXA Fall on same level from slipping, tripping and stumbling without subsequent striking against object, initial encounter: Secondary | ICD-10-CM | POA: Diagnosis present

## 2016-10-01 DIAGNOSIS — E785 Hyperlipidemia, unspecified: Secondary | ICD-10-CM | POA: Diagnosis present

## 2016-10-01 DIAGNOSIS — Z951 Presence of aortocoronary bypass graft: Secondary | ICD-10-CM

## 2016-10-01 DIAGNOSIS — S72031A Displaced midcervical fracture of right femur, initial encounter for closed fracture: Secondary | ICD-10-CM | POA: Diagnosis not present

## 2016-10-01 DIAGNOSIS — I10 Essential (primary) hypertension: Secondary | ICD-10-CM | POA: Diagnosis present

## 2016-10-01 DIAGNOSIS — F0390 Unspecified dementia without behavioral disturbance: Secondary | ICD-10-CM | POA: Diagnosis present

## 2016-10-01 DIAGNOSIS — Z8546 Personal history of malignant neoplasm of prostate: Secondary | ICD-10-CM

## 2016-10-01 DIAGNOSIS — S7291XA Unspecified fracture of right femur, initial encounter for closed fracture: Secondary | ICD-10-CM | POA: Diagnosis present

## 2016-10-01 DIAGNOSIS — I2510 Atherosclerotic heart disease of native coronary artery without angina pectoris: Secondary | ICD-10-CM | POA: Diagnosis present

## 2016-10-01 DIAGNOSIS — Z794 Long term (current) use of insulin: Secondary | ICD-10-CM

## 2016-10-01 DIAGNOSIS — I82409 Acute embolism and thrombosis of unspecified deep veins of unspecified lower extremity: Secondary | ICD-10-CM

## 2016-10-01 DIAGNOSIS — E1165 Type 2 diabetes mellitus with hyperglycemia: Secondary | ICD-10-CM | POA: Diagnosis present

## 2016-10-01 DIAGNOSIS — Z885 Allergy status to narcotic agent status: Secondary | ICD-10-CM

## 2016-10-01 LAB — COMPREHENSIVE METABOLIC PANEL
ALBUMIN: 3.2 g/dL — AB (ref 3.5–5.0)
ALK PHOS: 59 U/L (ref 38–126)
ALT: 15 U/L — ABNORMAL LOW (ref 17–63)
AST: 17 U/L (ref 15–41)
Anion gap: 9 (ref 5–15)
BILIRUBIN TOTAL: 0.5 mg/dL (ref 0.3–1.2)
BUN: 24 mg/dL — AB (ref 6–20)
CALCIUM: 9.1 mg/dL (ref 8.9–10.3)
CO2: 26 mmol/L (ref 22–32)
Chloride: 99 mmol/L — ABNORMAL LOW (ref 101–111)
Creatinine, Ser: 1.11 mg/dL (ref 0.61–1.24)
GFR calc Af Amer: >60 mL/min (ref >60)
GFR calc non Af Amer: 58 mL/min — ABNORMAL LOW (ref >60)
GLUCOSE: 118 mg/dL — AB (ref 65–99)
Potassium: 3.5 mmol/L (ref 3.5–5.1)
SODIUM: 134 mmol/L — AB (ref 135–145)
TOTAL PROTEIN: 6.3 g/dL — AB (ref 6.5–8.1)

## 2016-10-01 LAB — CBC WITH DIFFERENTIAL/PLATELET
Basophils Absolute: 0 10*3/uL (ref 0.0–0.1)
Basophils Relative: 1 %
EOS ABS: 0.5 10*3/uL (ref 0.0–0.7)
EOS PCT: 5 %
HEMATOCRIT: 35.1 % — AB (ref 39.0–52.0)
HEMOGLOBIN: 11.7 g/dL — AB (ref 13.0–17.0)
LYMPHS ABS: 1.2 10*3/uL (ref 0.7–4.0)
LYMPHS PCT: 14 %
MCH: 29.3 pg (ref 26.0–34.0)
MCHC: 33.3 g/dL (ref 30.0–36.0)
MCV: 87.8 fL (ref 78.0–100.0)
MONOS PCT: 10 %
Monocytes Absolute: 0.9 10*3/uL (ref 0.1–1.0)
NEUTROS PCT: 70 %
Neutro Abs: 6 10*3/uL (ref 1.7–7.7)
Platelets: 285 10*3/uL (ref 150–400)
RBC: 4 MIL/uL — AB (ref 4.22–5.81)
RDW: 13.4 % (ref 11.5–15.5)
WBC: 8.6 10*3/uL (ref 4.0–10.5)

## 2016-10-01 MED ORDER — FENTANYL CITRATE (PF) 100 MCG/2ML IJ SOLN
50.0000 ug | Freq: Once | INTRAMUSCULAR | Status: AC
  Administered 2016-10-01: 50 ug via INTRAVENOUS
  Filled 2016-10-01: qty 2

## 2016-10-01 NOTE — ED Provider Notes (Signed)
[place] DEPT Provider Note   CSN: [id number] Arrival date & time: 10/01/16  2115     History   Chief Complaint Chief Complaint  Patient presents with  . Hip Pain    HPI Thomas [name] is a 83 y.o. male.  HPI Patient presents with right hip pain after fall last week. States he fell from standing position landing on his right hip. Denies head or neck injury. States he's had persistent pain to the right hip and knee since the fall. He's been able to ambulate with walker. Denies any focal weakness or numbness. Denies fever or chills. Past Medical History:  Diagnosis Date  . Anginal pain ([place])   . Cancer ([place])   . Diabetes mellitus   . Hyperlipemia   . Lung cancer ([place])   . Prostate cancer [facility])     Patient Active Problem List   Diagnosis Date Noted  . Anemia 07/13/2016  . UGIB (upper gastrointestinal bleed)   . Hematemesis 07/11/2016  . Hemoptysis 07/10/2016  . HCAP (healthcare-associated pneumonia) 07/10/2016  . Protein-calorie malnutrition, severe 05/05/2016  . Nondisplaced intertrochanteric fracture of left femur, initial encounter for closed fracture ([place]) 05/03/2016  . Type 2 diabetes mellitus with hyperglycemia, with long-term current use of insulin ([place]) 05/03/2016  . Type 1 diabetes mellitus with hyperlipidemia ([place]) 05/03/2016  . Essential hypertension 05/03/2016  . Altered mental status 07/06/2015  . Normocytic anemia 07/06/2015  . Hypotension 07/06/2015  . Anxiety 07/06/2015  . Arterial hypotension   . Abnormal laboratory test result 11/01/2012  . Epigastric pain 09/30/2012  . NSTEMI (non-ST elevated myocardial infarction) ([place]) 09/30/2012  . Acute renal failure ([place]) 09/09/2012  . Acute encephalopathy 09/07/2012  . Malignant neoplasm of upper lobe, bronchus or lung 02/19/2011  . Right bundle branch block with left anterior fascicular block 03/31/2009  . CHEST PAIN-UNSPECIFIED 03/31/2009  . Insulin dependent diabetes mellitus ([place]) 07/18/2008  .  HYPERLIPIDEMIA-MIXED 07/18/2008  . CAD, ARTERY BYPASS GRAFT 07/18/2008  . COPD (chronic obstructive pulmonary disease) ([place]) 07/18/2008    Past Surgical History:  Procedure Laterality Date  . BACK SURGERY    . CARDIAC SURGERY    . ESOPHAGOGASTRODUODENOSCOPY N/A 07/11/2016   Procedure: ESOPHAGOGASTRODUODENOSCOPY (EGD);  Surgeon: Rogene Houston, MD;  Location[facility];  Service: Endoscopy;  Laterality: N/A;  . INTRAMEDULLARY (IM) NAIL INTERTROCHANTERIC Left 05/04/2016   Procedure: INTRAMEDULLARY (IM) NAIL INTERTROCHANTRIC;  Surgeon: Renette Butters, MD;  Location: [place];  Service: Orthopedics;  Laterality: Left;  . PROSTATE SURGERY    . surgery to repair aneurysm    . TUMOR REMOVAL     from lung       Home Medications    Prior to Admission medications   Medication Sig Start Date End Date Taking? Authorizing Provider  acetaminophen (TYLENOL) 500 MG tablet Take 1,000 mg by mouth every 8 (eight) hours as needed for moderate pain.   Yes [provider]  atorvastatin (LIPITOR) 40 MG tablet Take 40 mg by mouth daily.   Yes [provider]  budesonide-formoterol (SYMBICORT) 160-4.5 MCG/ACT inhaler Inhale 2 puffs into the lungs 2 (two) times daily.   Yes [provider]  citalopram (CELEXA) 10 MG tablet Take 1 tablet (10 mg total) by mouth daily. 07/10/15  Yes Isaac Bliss, Rayford Halsted, MD  diltiazem (CARDIZEM CD) 240 MG 24 hr capsule TAKE ONE CAPSULE ORALLY EVERY MORNING. 03/01/15  Yes Lendon Colonel, NP  donepezil (ARICEPT) 5 MG tablet Take 5 mg by mouth at bedtime.  Yes [provider]  ferrous sulfate 325 (65 FE) MG tablet Take 325 mg by mouth 2 (two) times daily with a meal.    Yes [provider]  HYDROcodone-acetaminophen (NORCO) 5-325 MG tablet Take 1-2 tablets by mouth every 6 (six) hours as needed for moderate pain. 05/06/16  Yes Prudencio Burly III, PA-C  insulin glargine (LANTUS) 100 UNIT/ML injection Inject 12 Units  into the skin at bedtime.   Yes [provider]  ipratropium-albuterol (DUONEB) 0.5-2.5 (3) MG/3ML SOLN Take 3 mLs by nebulization every 6 (six) hours as needed (dyspnea cough/wheezing).   Yes [provider]  isosorbide mononitrate (IMDUR) 30 MG 24 hr tablet TAKE ONE TABLET BY MOUTH ONCE DAILY. 02/17/15  Yes Lendon Colonel, NP  Multiple Vitamins-Minerals (PRESERVISION AREDS) CAPS Take 1 capsule by mouth 2 (two) times daily.   Yes [provider]  oxybutynin (DITROPAN-XL) 10 MG 24 hr tablet Take 10 mg by mouth at bedtime.   Yes [provider]  pantoprazole (PROTONIX) 40 MG tablet Take 1 tablet (40 mg total) by mouth 2 (two) times daily. 07/13/16  Yes Orvan Falconer, MD  tiotropium (SPIRIVA) 18 MCG inhalation capsule Place 18 mcg into inhaler and inhale daily.   Yes [provider]  Vitamin D, Ergocalciferol, (DRISDOL) 50000 units CAPS capsule Take 50,000 Units by mouth every 7 (seven) days.   Yes [provider]  amoxicillin-clavulanate (AUGMENTIN) 875-125 MG tablet Take 1 tablet by mouth every 12 (twelve) hours. Patient not taking: Reported on 08/14/2016 07/13/16   Orvan Falconer, MD  senna (SENOKOT) 8.6 MG TABS tablet Take 1 tablet (8.6 mg total) by mouth 2 (two) times daily as needed for mild constipation. 05/06/16   Patrecia Pour, MD    Family History History reviewed. No pertinent family history.  Social History Social History  Substance Use Topics  . Smoking status: Former Smoker    Packs/day: 1.00    Years: 50.00    Types: Cigarettes    Quit date: 12/10/1993  . Smokeless tobacco: Former Systems developer    Types: Chew  . Alcohol use No     Allergies   Nubain [nalbuphine hcl]   Review of Systems Review of Systems  Constitutional: Negative for chills and fever.  Respiratory: Negative for cough and shortness of breath.   Cardiovascular: Negative for chest pain.  Gastrointestinal: Negative for abdominal pain, diarrhea, nausea and vomiting.    Musculoskeletal: Positive for arthralgias and gait problem. Negative for back pain, joint swelling, myalgias and neck pain.  Skin: Negative for rash and wound.  Neurological: Negative for dizziness, weakness, light-headedness, numbness and headaches.     Physical Exam Updated Vital Signs BP (!) 117/53   Pulse 66   Temp 97.8 F (36.6 C)   Resp 17   Ht 5\' 8"  (1.727 m)   Wt 66.2 kg (146 lb)   SpO2 99%   BMI 22.20 kg/m   Physical Exam  Constitutional: He is oriented to person, place, and time. He appears well-developed and well-nourished. No distress.  HENT:  Head: Normocephalic and atraumatic.  Mouth/Throat: Oropharynx is clear and moist.  Eyes: Pupils are equal, round, and reactive to light. EOM are normal.  Neck: Normal range of motion. Neck supple.  Cardiovascular: Normal rate and regular rhythm.   Pulmonary/Chest: Effort normal and breath sounds normal.  Abdominal: Soft. Bowel sounds are normal. There is no tenderness. There is no rebound and no guarding.  Musculoskeletal: Normal range of motion. He exhibits tenderness. He  exhibits no edema.  Patient has tenderness to palpation over the lateral right hip.He has pain with range of motion.Also some tenderness over the knee without obvious effusion or erythema.The right lower extremity appears mildly shortened and possibly externally rotated. 2+ distal pulses in all extremities.  Neurological: He is alert and oriented to person, place, and time.  Sensation fully intact. Decreased movement of the right lower extremity due to pain. He is moving his toes without difficulty. Moving all other extremities without deficit.  Skin: Skin is warm and dry. Capillary refill takes less than 2 seconds. No rash noted. No erythema.  Psychiatric: He has a normal mood and affect. His behavior is normal.  Nursing note and vitals reviewed.    ED Treatments / Results  Labs (all labs ordered are listed, but only abnormal results are  displayed) Labs Reviewed  CBC WITH DIFFERENTIAL/PLATELET - Abnormal; Notable for the following:       Result Value   RBC 4.00 (*)    Hemoglobin 11.7 (*)    HCT 35.1 (*)    All other components within normal limits  COMPREHENSIVE METABOLIC PANEL - Abnormal; Notable for the following:    Sodium 134 (*)    Chloride 99 (*)    Glucose, Bld 118 (*)    BUN 24 (*)    Total Protein 6.3 (*)    Albumin 3.2 (*)    ALT 15 (*)    GFR calc non Af Amer 58 (*)    All other components within normal limits    EKG  EKG Interpretation None       Radiology Dg Chest 1 View  Result Date: 10/01/2016 CLINICAL DATA:  Preop right hip fracture.  Patient fell last week. EXAM: CHEST 1 VIEW COMPARISON:  07/10/2016 CXR FINDINGS: Status post CABG with aortic atherosclerosis. Borderline cardiomegaly is noted. The patient is status post kyphoplasty at the thoracolumbar junction. Minimal atelectasis and/or scarring is seen at the right lung base. There may be a tiny right effusion blunting the right costophrenic angle. Emphysematous hyperinflation of the lungs with chronic scarring in the right upper lobe. No pneumonic consolidation. IMPRESSION: 1. Postop appearance of the right lung with scarring. 2. Minimal blunting the right costophrenic angle may reflect a small effusion pleural thickening. 3. Status post CABG. Electronically Signed   By: Ashley Royalty M.D.   On: 10/01/2016 22:25   Dg Knee Complete 4 Views Right  Result Date: 10/01/2016 CLINICAL DATA:  Right hip pain after fall last week. EXAM: RIGHT KNEE - COMPLETE 4+ VIEW COMPARISON:  None. FINDINGS: Osteoarthritic joint space narrowing of the patellofemoral and femorotibial compartments without joint effusion, fracture or malalignment. Vascular clips are seen about the medial aspect of right leg. IMPRESSION: Osteoarthritis of the right knee without acute osseous appearing abnormality. Electronically Signed   By: Ashley Royalty M.D.   On: 10/01/2016 22:26   Dg Hip  Unilat W Or Wo Pelvis 2-3 Views Right  Result Date: 10/01/2016 CLINICAL DATA:  Right hip pain after fall last week. EXAM: DG HIP (WITH OR WITHOUT PELVIS) 2-3V RIGHT COMPARISON:  07/10/2016 CT scout radiograph FINDINGS: An acute, closed, varus angulated transcervical fracture of the right femur is noted. No hip dislocation. Partially included left femoral nail fixation of the left femur is intact. No acute osseous abnormality of the bony pelvis. Status post radical prostatectomy with numerous surgical clips in lower pelvis bilaterally. There is lower lumbar spondylosis. Penile prosthesis is noted. IMPRESSION: Acute, varus angulated transcervical fracture of  the right femoral neck. Electronically Signed   By: Ashley Royalty M.D.   On: 10/01/2016 22:23    Procedures Procedures (including critical care time)  Medications Ordered in ED Medications  fentaNYL (SUBLIMAZE) injection 50 mcg (50 mcg Intravenous Given 10/01/16 2221)     Initial Impression / Assessment and Plan / ED Course  I have reviewed the triage vital signs and the nursing notes.  Pertinent labs & imaging results that were available during my care of the patient were reviewed by me and considered in my medical decision making (see chart for details).    Discussed with Dr. Aline Brochure. Will see patient in the morning. Hospitalist to admit.   Final Clinical Impressions(s) / ED Diagnoses   Final diagnoses:  Closed right hip fracture, initial encounter [facility])    New Prescriptions New Prescriptions   No medications on file     Julianne Rice, MD 10/01/16 2316

## 2016-10-01 NOTE — ED Triage Notes (Signed)
Pt c/o pain to right hip after a fall last week; pt has shortening and external rotation of right leg

## 2016-10-02 ENCOUNTER — Observation Stay (HOSPITAL_COMMUNITY): Payer: Medicare Other

## 2016-10-02 ENCOUNTER — Encounter (HOSPITAL_COMMUNITY): Payer: Self-pay | Admitting: *Deleted

## 2016-10-02 DIAGNOSIS — E1151 Type 2 diabetes mellitus with diabetic peripheral angiopathy without gangrene: Secondary | ICD-10-CM | POA: Diagnosis present

## 2016-10-02 DIAGNOSIS — M25551 Pain in right hip: Secondary | ICD-10-CM | POA: Diagnosis present

## 2016-10-02 DIAGNOSIS — E785 Hyperlipidemia, unspecified: Secondary | ICD-10-CM | POA: Diagnosis present

## 2016-10-02 DIAGNOSIS — J449 Chronic obstructive pulmonary disease, unspecified: Secondary | ICD-10-CM | POA: Diagnosis present

## 2016-10-02 DIAGNOSIS — S72001A Fracture of unspecified part of neck of right femur, initial encounter for closed fracture: Secondary | ICD-10-CM

## 2016-10-02 DIAGNOSIS — D649 Anemia, unspecified: Secondary | ICD-10-CM | POA: Diagnosis present

## 2016-10-02 DIAGNOSIS — F0390 Unspecified dementia without behavioral disturbance: Secondary | ICD-10-CM | POA: Diagnosis present

## 2016-10-02 DIAGNOSIS — S7291XA Unspecified fracture of right femur, initial encounter for closed fracture: Secondary | ICD-10-CM | POA: Diagnosis present

## 2016-10-02 DIAGNOSIS — Z951 Presence of aortocoronary bypass graft: Secondary | ICD-10-CM | POA: Diagnosis not present

## 2016-10-02 DIAGNOSIS — E1165 Type 2 diabetes mellitus with hyperglycemia: Secondary | ICD-10-CM | POA: Diagnosis present

## 2016-10-02 DIAGNOSIS — Z794 Long term (current) use of insulin: Secondary | ICD-10-CM | POA: Diagnosis not present

## 2016-10-02 DIAGNOSIS — S72031A Displaced midcervical fracture of right femur, initial encounter for closed fracture: Secondary | ICD-10-CM | POA: Diagnosis present

## 2016-10-02 DIAGNOSIS — I252 Old myocardial infarction: Secondary | ICD-10-CM | POA: Diagnosis not present

## 2016-10-02 DIAGNOSIS — Z885 Allergy status to narcotic agent status: Secondary | ICD-10-CM | POA: Diagnosis not present

## 2016-10-02 DIAGNOSIS — I10 Essential (primary) hypertension: Secondary | ICD-10-CM | POA: Diagnosis present

## 2016-10-02 DIAGNOSIS — I2510 Atherosclerotic heart disease of native coronary artery without angina pectoris: Secondary | ICD-10-CM | POA: Diagnosis present

## 2016-10-02 DIAGNOSIS — Z8546 Personal history of malignant neoplasm of prostate: Secondary | ICD-10-CM | POA: Diagnosis not present

## 2016-10-02 DIAGNOSIS — W010XXA Fall on same level from slipping, tripping and stumbling without subsequent striking against object, initial encounter: Secondary | ICD-10-CM | POA: Diagnosis present

## 2016-10-02 LAB — GLUCOSE, CAPILLARY
GLUCOSE-CAPILLARY: 139 mg/dL — AB (ref 65–99)
GLUCOSE-CAPILLARY: 140 mg/dL — AB (ref 65–99)
Glucose-Capillary: 99 mg/dL (ref 65–99)

## 2016-10-02 LAB — URINALYSIS, ROUTINE W REFLEX MICROSCOPIC
BILIRUBIN URINE: NEGATIVE
GLUCOSE, UA: NEGATIVE mg/dL
HGB URINE DIPSTICK: NEGATIVE
KETONES UR: NEGATIVE mg/dL
Leukocytes, UA: NEGATIVE
Nitrite: NEGATIVE
PROTEIN: NEGATIVE mg/dL
Specific Gravity, Urine: 1.015 (ref 1.005–1.030)
pH: 5 (ref 5.0–8.0)

## 2016-10-02 LAB — PREPARE RBC (CROSSMATCH)

## 2016-10-02 LAB — HEMOGLOBIN A1C
Hgb A1c MFr Bld: 5.7 % — ABNORMAL HIGH (ref 4.8–5.6)
Mean Plasma Glucose: 116.89 mg/dL

## 2016-10-02 LAB — MRSA PCR SCREENING: MRSA BY PCR: NEGATIVE

## 2016-10-02 MED ORDER — SENNA 8.6 MG PO TABS: 1.0000 | ORAL_TABLET | Freq: Two times a day (BID) | ORAL | Status: DC | PRN

## 2016-10-02 MED ORDER — ATORVASTATIN CALCIUM 40 MG PO TABS
40.0000 mg | ORAL_TABLET | Freq: Every day | ORAL | Status: DC
  Administered 2016-10-04 – 2016-10-05 (×2): 40 mg via ORAL
  Filled 2016-10-02 (×2): qty 1

## 2016-10-02 MED ORDER — CITALOPRAM HYDROBROMIDE 10 MG PO TABS
10.0000 mg | ORAL_TABLET | Freq: Every day | ORAL | Status: DC
  Administered 2016-10-04 – 2016-10-05 (×2): 10 mg via ORAL
  Filled 2016-10-02 (×5): qty 1

## 2016-10-02 MED ORDER — HYDROCODONE-ACETAMINOPHEN 5-325 MG PO TABS
1.0000 | ORAL_TABLET | Freq: Four times a day (QID) | ORAL | Status: DC | PRN
  Administered 2016-10-02: 2 via ORAL
  Administered 2016-10-03: 1 via ORAL
  Administered 2016-10-04 (×2): 2 via ORAL
  Administered 2016-10-05: 1 via ORAL
  Administered 2016-10-05: 2 via ORAL
  Filled 2016-10-02: qty 1
  Filled 2016-10-02 (×3): qty 2
  Filled 2016-10-02: qty 1
  Filled 2016-10-02: qty 2

## 2016-10-02 MED ORDER — VITAMIN D (ERGOCALCIFEROL) 1.25 MG (50000 UNIT) PO CAPS: 50000.0000 [IU] | ORAL_CAPSULE | ORAL | Status: DC

## 2016-10-02 MED ORDER — INSULIN ASPART 100 UNIT/ML ~~LOC~~ SOLN
0.0000 [IU] | Freq: Three times a day (TID) | SUBCUTANEOUS | Status: DC
  Administered 2016-10-03: 1 [IU] via SUBCUTANEOUS
  Administered 2016-10-03: 2 [IU] via SUBCUTANEOUS
  Administered 2016-10-04: 1 [IU] via SUBCUTANEOUS
  Administered 2016-10-05: 2 [IU] via SUBCUTANEOUS

## 2016-10-02 MED ORDER — FENTANYL CITRATE (PF) 100 MCG/2ML IJ SOLN
25.0000 ug | INTRAMUSCULAR | Status: DC | PRN
  Administered 2016-10-02 – 2016-10-04 (×8): 25 ug via INTRAVENOUS
  Filled 2016-10-02 (×8): qty 2

## 2016-10-02 MED ORDER — MOMETASONE FURO-FORMOTEROL FUM 200-5 MCG/ACT IN AERO
2.0000 | INHALATION_SPRAY | Freq: Two times a day (BID) | RESPIRATORY_TRACT | Status: DC
  Administered 2016-10-02 – 2016-10-05 (×3): 2 via RESPIRATORY_TRACT
  Filled 2016-10-02 (×2): qty 8.8

## 2016-10-02 MED ORDER — IPRATROPIUM-ALBUTEROL 0.5-2.5 (3) MG/3ML IN SOLN: 3.0000 mL | Freq: Four times a day (QID) | RESPIRATORY_TRACT | Status: DC | PRN

## 2016-10-02 MED ORDER — SODIUM CHLORIDE 0.9 % IV SOLN: Freq: Once | INTRAVENOUS | Status: DC

## 2016-10-02 MED ORDER — ISOSORBIDE MONONITRATE ER 30 MG PO TB24
30.0000 mg | ORAL_TABLET | Freq: Every day | ORAL | Status: DC
  Administered 2016-10-04 – 2016-10-05 (×2): 30 mg via ORAL
  Filled 2016-10-02 (×2): qty 1

## 2016-10-02 MED ORDER — TIOTROPIUM BROMIDE MONOHYDRATE 18 MCG IN CAPS
18.0000 ug | ORAL_CAPSULE | Freq: Every day | RESPIRATORY_TRACT | Status: DC
  Administered 2016-10-05: 18 ug via RESPIRATORY_TRACT
  Filled 2016-10-02 (×2): qty 5

## 2016-10-02 MED ORDER — DONEPEZIL HCL 5 MG PO TABS
5.0000 mg | ORAL_TABLET | Freq: Every day | ORAL | Status: DC
Start: 2016-10-02 — End: 2016-10-05
  Administered 2016-10-02 – 2016-10-04 (×3): 5 mg via ORAL
  Filled 2016-10-02 (×3): qty 1

## 2016-10-02 MED ORDER — HEPARIN SODIUM (PORCINE) 5000 UNIT/ML IJ SOLN
5000.0000 [IU] | Freq: Three times a day (TID) | INTRAMUSCULAR | Status: DC
  Administered 2016-10-02 (×2): 5000 [IU] via SUBCUTANEOUS
  Filled 2016-10-02 (×2): qty 1

## 2016-10-02 MED ORDER — OXYBUTYNIN CHLORIDE ER 10 MG PO TB24
10.0000 mg | ORAL_TABLET | Freq: Every day | ORAL | Status: DC
  Administered 2016-10-02 – 2016-10-04 (×3): 10 mg via ORAL
  Filled 2016-10-02 (×4): qty 1

## 2016-10-02 MED ORDER — PANTOPRAZOLE SODIUM 40 MG PO TBEC
40.0000 mg | DELAYED_RELEASE_TABLET | Freq: Two times a day (BID) | ORAL | Status: DC
  Administered 2016-10-03 – 2016-10-05 (×4): 40 mg via ORAL
  Filled 2016-10-02 (×4): qty 1

## 2016-10-02 MED ORDER — DILTIAZEM HCL ER COATED BEADS 240 MG PO CP24
240.0000 mg | ORAL_CAPSULE | Freq: Every day | ORAL | Status: DC
  Administered 2016-10-04 – 2016-10-05 (×2): 240 mg via ORAL
  Filled 2016-10-02 (×2): qty 1

## 2016-10-02 NOTE — ED Notes (Signed)
[facility] traction canceled per Dr. Nicoletta Dress.

## 2016-10-02 NOTE — Progress Notes (Signed)
FOLLOW UP NOTE FROM EARLIER ADMISSION TODAY:  83 yo with DM, HLD, lung and prostate CA, hx of left hip surgery March 2018, Dr Alain Marion, fell and has right hip Fx, requiring surgery.  Dr Aline Brochure sent a page requesting he be transferred citing his comorbid conditions. I spoke with Dr Percell Miller, who planned to have ORIF tomorrow.  He asked for NPO after midnight. Will lateral transfer to Dr Renato Shin of the hospitalist service.   Orvan Falconer MD FACP. Hospitalist.

## 2016-10-02 NOTE — Progress Notes (Signed)
Consents for Blood and Surgery obtained via phone by Pt's Legal Guardian at [place]: Netta Cedars [employer]CD[employer] [phone] Ext [phone].  Noted, is second nurse as witness of verbal consent by guardian.  Both consents will be sent to [facility] Upon Transfer.  Continue administering Fentanyl Q2 hours PRN for pain control with good effect.  Pt is currently eating lunch w/o complaints without movement of right lower extremity.  He has been re-oriented several times regarding his current medical situation and the need for transfer to [facility].  He has dx of Dementia.  Will continue to monitor until transfer takes place.

## 2016-10-02 NOTE — Anesthesia Preprocedure Evaluation (Addendum)
Anesthesia Evaluation  Patient identified by MRN, date of birth, ID band  Reviewed: Allergy & Precautions, NPO status , Patient's Chart, lab work & pertinent test results  Airway Mallampati: II   Neck ROM: Full    Dental  (+) Dental Advisory Given, Edentulous Lower, Edentulous Upper   Pulmonary COPD, former smoker,    breath sounds clear to auscultation       Cardiovascular hypertension, + angina + CAD, + Past MI and + Peripheral Vascular Disease  + dysrhythmias  Rhythm:Regular Rate:Normal  TTE 07/2015 -  - Left ventricle: Mild LVH. Systolic function was normal.   Diastolic function is abnormal, indeterminate grade. - No notable valvular issues. - Technically difficult study.  EKG - SR w/ RBBB, old inferior infarct   Neuro/Psych Anxiety Dementia    GI/Hepatic negative GI ROS, Neg liver ROS,   Endo/Other  diabetes  Renal/GU   negative genitourinary   Musculoskeletal negative musculoskeletal ROS (+)   Abdominal   Peds  Hematology  (+) anemia ,   Anesthesia Other Findings   Reproductive/Obstetrics                           Anesthesia Physical  Anesthesia Plan  ASA: III  Anesthesia Plan: Spinal   Post-op Pain Management:    Induction: Intravenous  PONV Risk Score and Plan: 1 and Ondansetron, Treatment may vary due to age or medical condition and Propofol infusion  Airway Management Planned: Simple Face Mask  Additional Equipment:   Intra-op Plan:   Post-operative Plan:   Informed Consent: I have reviewed the patients History and Physical, chart, labs and discussed the procedure including the risks, benefits and alternatives for the proposed anesthesia with the patient or authorized representative who has indicated his/her understanding and acceptance.   Dental advisory given  Plan Discussed with: CRNA  Anesthesia Plan Comments:        Anesthesia Quick Evaluation

## 2016-10-02 NOTE — H&P (Signed)
TRH H&P    Patient Demographics:    Thomas [name], is a 83 y.o. male  MRN: [id number]  DOB - [birth year]  Admit Date - 10/01/2016  Referring MD/NP/PA: Dr. Lita Mains  Outpatient Primary MD for the patient is Mal Amabile Anthony Sar, MD  Patient coming from: Home  Chief Complaint  Patient presents with  . Hip Pain      HPI:    Thomas [name]  is a 83 y.o. male, With history of diabetes mellitus, hyperlipidemia, lung cancer, prostate cancer was brought to hospital with right hip pain after fall last week. Patient says that he was outside his room in the nursing home and was told to go back and then he certainly moved backwards and lost balance and fell on righ hurting his right hip and right chest wall. Patient says that he has had persistent pain since the fall. He was able to ambulate with walker.  He denies nausea, vomiting or diarrhea. Denies shortness of breath. No abdominal pain. No chest pain    Review of systems:      All other systems reviewed and are negative.   With Past History of the following :    Past Medical History:  Diagnosis Date  . Anginal pain ([place])   . Cancer ([place])   . Diabetes mellitus   . Hyperlipemia   . Lung cancer ([place])   . Prostate cancer [facility])       Past Surgical History:  Procedure Laterality Date  . BACK SURGERY    . CARDIAC SURGERY    . ESOPHAGOGASTRODUODENOSCOPY N/A 07/11/2016   Procedure: ESOPHAGOGASTRODUODENOSCOPY (EGD);  Surgeon: Rogene Houston, MD;  [facility];  Service: Endoscopy;  Laterality: N/A;  . INTRAMEDULLARY (IM) NAIL INTERTROCHANTERIC Left 05/04/2016   Procedure: INTRAMEDULLARY (IM) NAIL INTERTROCHANTRIC;  Surgeon: Renette Butters, MD;  Location: [facility];  Service: Orthopedics;  Laterality: Left;  . PROSTATE SURGERY    . surgery to repair aneurysm    . TUMOR REMOVAL     from lung      Social History:      Social History    Substance Use Topics  . Smoking status: Former Smoker    Packs/day: 1.00    Years: 50.00    Types: Cigarettes    Quit date: 12/10/1993  . Smokeless tobacco: Former Systems developer    Types: Chew  . Alcohol use No       Family History :   Unable to obtain as patient is a poor historian   Home Medications:   Prior to Admission medications   Medication Sig Start Date End Date Taking? Authorizing Provider  acetaminophen (TYLENOL) 500 MG tablet Take 1,000 mg by mouth every 8 (eight) hours as needed for moderate pain.   Yes [provider]  atorvastatin (LIPITOR) 40 MG tablet Take 40 mg by mouth daily.   Yes [provider]  budesonide-formoterol (SYMBICORT) 160-4.5 MCG/ACT inhaler Inhale 2 puffs into the lungs 2 (two) times daily.   Yes [provider]  citalopram (CELEXA) 10 MG  tablet Take 1 tablet (10 mg total) by mouth daily. 07/10/15  Yes Isaac Bliss, Rayford Halsted, MD  diltiazem (CARDIZEM CD) 240 MG 24 hr capsule TAKE ONE CAPSULE ORALLY EVERY MORNING. 03/01/15  Yes Lendon Colonel, NP  donepezil (ARICEPT) 5 MG tablet Take 5 mg by mouth at bedtime.   Yes [provider]  ferrous sulfate 325 (65 FE) MG tablet Take 325 mg by mouth 2 (two) times daily with a meal.    Yes [provider]  HYDROcodone-acetaminophen (NORCO) 5-325 MG tablet Take 1-2 tablets by mouth every 6 (six) hours as needed for moderate pain. 05/06/16  Yes Prudencio Burly III, PA-C  insulin glargine (LANTUS) 100 UNIT/ML injection Inject 12 Units into the skin at bedtime.   Yes [provider]  ipratropium-albuterol (DUONEB) 0.5-2.5 (3) MG/3ML SOLN Take 3 mLs by nebulization every 6 (six) hours as needed (dyspnea cough/wheezing).   Yes [provider]  isosorbide mononitrate (IMDUR) 30 MG 24 hr tablet TAKE ONE TABLET BY MOUTH ONCE DAILY. 02/17/15  Yes Lendon Colonel, NP  Multiple Vitamins-Minerals (PRESERVISION AREDS) CAPS Take 1 capsule by mouth 2 (two) times  daily.   Yes [provider]  oxybutynin (DITROPAN-XL) 10 MG 24 hr tablet Take 10 mg by mouth at bedtime.   Yes [provider]  pantoprazole (PROTONIX) 40 MG tablet Take 1 tablet (40 mg total) by mouth 2 (two) times daily. 07/13/16  Yes Orvan Falconer, MD  tiotropium (SPIRIVA) 18 MCG inhalation capsule Place 18 mcg into inhaler and inhale daily.   Yes [provider]  Vitamin D, Ergocalciferol, (DRISDOL) 50000 units CAPS capsule Take 50,000 Units by mouth every 7 (seven) days.   Yes [provider]  amoxicillin-clavulanate (AUGMENTIN) 875-125 MG tablet Take 1 tablet by mouth every 12 (twelve) hours. Patient not taking: Reported on 08/14/2016 07/13/16   Orvan Falconer, MD  senna (SENOKOT) 8.6 MG TABS tablet Take 1 tablet (8.6 mg total) by mouth 2 (two) times daily as needed for mild constipation. 05/06/16   Patrecia Pour, MD     Allergies:     Allergies  Allergen Reactions  . Nubain [Nalbuphine Hcl]     Listed on MAR-no reaction is noted     Physical Exam:   Vitals  Blood pressure (!) 146/65, pulse 62, temperature 97.8 F (36.6 C), resp. rate 18, height 5\' 8"  (1.727 m), weight 66.2 kg (146 lb), SpO2 99 %.  1.  General: Appears in no acute distress   2. Psychiatric:  Intact judgement and  insight, awake alert, oriented x 3.  3. Neurologic: No focal neurological deficits, all cranial nerves intact.Strength 5/5 all 4 extremities, sensation intact all 4 extremities, plantars down going.  4. Eyes :  anicteric sclerae, moist conjunctivae with no lid lag. PERRLA.  5. ENMT:  Oropharynx clear with moist mucous membranes and good dentition  6. Neck:  supple, no cervical lymphadenopathy appriciated, No thyromegaly  7. Respiratory : Normal respiratory effort, good air movement bilaterally,clear to  auscultation bilaterally  8. Cardiovascular : RRR, no gallops, rubs or murmurs, no leg edema  9. Gastrointestinal:  Positive bowel sounds, abdomen soft, non-tender  to palpation,no hepatosplenomegaly, no rigidity or guarding       10. Skin:  No cyanosis, normal texture and turgor, no rash, lesions or ulcers  11.Musculoskeletal:  Tenderness noted at right hip joint, range of motion limited due to pain.    Data Review:    CBC  Recent Labs Lab 10/01/16  2200  WBC 8.6  HGB 11.7*  HCT 35.1*  PLT 285  MCV 87.8  MCH 29.3  MCHC 33.3  RDW 13.4  LYMPHSABS 1.2  MONOABS 0.9  EOSABS 0.5  BASOSABS 0.0   ------------------------------------------------------------------------------------------------------------------  Chemistries   Recent Labs Lab 10/01/16 2200  NA 134*  K 3.5  CL 99*  CO2 26  GLUCOSE 118*  BUN 24*  CREATININE 1.11  CALCIUM 9.1  AST 17  ALT 15*  ALKPHOS 59  BILITOT 0.5   ------------------------------------------------------------------------------------------------------------------  ------------------------------------------------------------------------------------------------------------------ GFR: Estimated Creatinine Clearance: 44.7 mL/min (by C-G formula based on SCr of 1.11 mg/dL). Liver Function Tests:  Recent Labs Lab 10/01/16 2200  AST 17  ALT 15*  ALKPHOS 59  BILITOT 0.5  PROT 6.3*  ALBUMIN 3.2*    --------------------------------------------------------------------------------------------------------------- Urine analysis:    Component Value Date/Time   COLORURINE YELLOW 10/01/2016 2326   APPEARANCEUR CLEAR 10/01/2016 2326   LABSPEC 1.015 10/01/2016 2326   PHURINE 5.0 10/01/2016 2326   GLUCOSEU NEGATIVE 10/01/2016 2326   HGBUR NEGATIVE 10/01/2016 2326   BILIRUBINUR NEGATIVE 10/01/2016 2326   KETONESUR NEGATIVE 10/01/2016 2326   PROTEINUR NEGATIVE 10/01/2016 2326   UROBILINOGEN 0.2 11/13/2012 1238   NITRITE NEGATIVE 10/01/2016 2326   LEUKOCYTESUR NEGATIVE 10/01/2016 2326      Imaging Results:    Dg Chest 1 View  Result Date: 10/01/2016 CLINICAL DATA:  Preop right hip fracture.   Patient fell last week. EXAM: CHEST 1 VIEW COMPARISON:  07/10/2016 CXR FINDINGS: Status post CABG with aortic atherosclerosis. Borderline cardiomegaly is noted. The patient is status post kyphoplasty at the thoracolumbar junction. Minimal atelectasis and/or scarring is seen at the right lung base. There may be a tiny right effusion blunting the right costophrenic angle. Emphysematous hyperinflation of the lungs with chronic scarring in the right upper lobe. No pneumonic consolidation. IMPRESSION: 1. Postop appearance of the right lung with scarring. 2. Minimal blunting the right costophrenic angle may reflect a small effusion pleural thickening. 3. Status post CABG. Electronically Signed   By: Ashley Royalty M.D.   On: 10/01/2016 22:25   Dg Knee Complete 4 Views Right  Result Date: 10/01/2016 CLINICAL DATA:  Right hip pain after fall last week. EXAM: RIGHT KNEE - COMPLETE 4+ VIEW COMPARISON:  None. FINDINGS: Osteoarthritic joint space narrowing of the patellofemoral and femorotibial compartments without joint effusion, fracture or malalignment. Vascular clips are seen about the medial aspect of right leg. IMPRESSION: Osteoarthritis of the right knee without acute osseous appearing abnormality. Electronically Signed   By: Ashley Royalty M.D.   On: 10/01/2016 22:26   Dg Hip Unilat W Or Wo Pelvis 2-3 Views Right  Result Date: 10/01/2016 CLINICAL DATA:  Right hip pain after fall last week. EXAM: DG HIP (WITH OR WITHOUT PELVIS) 2-3V RIGHT COMPARISON:  07/10/2016 CT scout radiograph FINDINGS: An acute, closed, varus angulated transcervical fracture of the right femur is noted. No hip dislocation. Partially included left femoral nail fixation of the left femur is intact. No acute osseous abnormality of the bony pelvis. Status post radical prostatectomy with numerous surgical clips in lower pelvis bilaterally. There is lower lumbar spondylosis. Penile prosthesis is noted. IMPRESSION: Acute, varus angulated transcervical  fracture of the right femoral neck. Electronically Signed   By: Ashley Royalty M.D.   On: 10/01/2016 22:23       Assessment & Plan:    Active Problems:   Type 2 diabetes mellitus with hyperglycemia, with long-term current use of insulin (HCC)   Femur fracture,  right ([place])   1. Right femoral neck fracture- will admit and initiate hip fracture protocol. Orthopedic surgery was consulted by ED physician. Dr. Aline Brochure to see patient in a.m. Patient takes Vicodin at home. Will continue Vicodin when necessary for pain. 2. Diabetes mellitus-will hold Lantus, start sliding scale insulin with NovoLog. 3. Hypertension- blood pressure is stable, continue home medications diltiazem. 4. Coronary artery disease- no chest pain. Patient had low risk  Lexiscan in  2015. Will obtain EKG. 5. Risk stratification -Due to advanced age and underlying CAD patient is moderate to high risk for surgery.   DVT Prophylaxis-   Heparin  AM Labs Ordered, also please review Full Orders  Family Communication: Admission, patients condition and plan of care including tests being ordered have been discussed with the patient and  who indicate understanding and agree with the plan and Code Status.  Code Status:  Full code  Admission status: Inpatient    Time spent in minutes : 60 min   Naiah Donahoe S M.D on 10/02/2016 at 2:36 AM  Between 7am to 7pm - Pager - [phone]. After 7pm go to [email or web address] - password [facility]  [facility] - Office  [phone]

## 2016-10-02 NOTE — Consult Note (Signed)
Reason for Consult:right hip fracture  Referring Physician: Dr Orvan Falconer  Thomas [name] is an 83 y.o. male.  HPI: 83 yo male h/o COPD, DIABETES , HYPERTENSION, CAD , CABG, 1PPD SMOKING, LOBECTOMY LUNG, LUMBAR DISC SURGERY, presents with right hip fracture thought to be 94 week old   Past Medical History:  Diagnosis Date  . Anginal pain ([place])   . Cancer ([place])   . Diabetes mellitus   . Hyperlipemia   . Lung cancer ([place])   . Prostate cancer [facility])     Past Surgical History:  Procedure Laterality Date  . BACK SURGERY    . CARDIAC SURGERY    . ESOPHAGOGASTRODUODENOSCOPY N/A 07/11/2016   Procedure: ESOPHAGOGASTRODUODENOSCOPY (EGD);  Surgeon: Rogene Houston, MD;  [facility];  Service: Endoscopy;  Laterality: N/A;  . INTRAMEDULLARY (IM) NAIL INTERTROCHANTERIC Left 05/04/2016   Procedure: INTRAMEDULLARY (IM) NAIL INTERTROCHANTRIC;  Surgeon: Renette Butters, MD;  Location: [place];  Service: Orthopedics;  Laterality: Left;  . PROSTATE SURGERY    . surgery to repair aneurysm    . TUMOR REMOVAL     from lung    History reviewed. No pertinent family history.  Social History:  reports that he quit smoking about 22 years ago. His smoking use included Cigarettes. He has a 50.00 pack-year smoking history. He has quit using smokeless tobacco. His smokeless tobacco use included Chew. He reports that he does not drink alcohol or use drugs.  Allergies:  Allergies  Allergen Reactions  . Nubain [Nalbuphine Hcl]     Listed on MAR-no reaction is noted    Medications: I have reviewed the patient's current medications.    ROS Blood pressure 136/70, pulse 63, temperature 97.8 F (36.6 C), resp. rate 15, height 5\' 8"  (1.727 m), weight 146 lb (66.2 kg), SpO2 95 %. Physical Exam  Assessment/Plan: CBC Latest Ref Rng & Units 10/01/2016 08/14/2016 07/14/2016  WBC 4.0 - 10.5 K/uL 8.6 6.3 6.3  Hemoglobin 13.0 - 17.0 g/dL 11.7(L) 11.3(L) 8.0(L)  Hematocrit 39.0 - 52.0 % 35.1(L) 35.0(L)  23.5(L)  Platelets 150 - 400 K/uL 285 240 170   BMP Latest Ref Rng & Units 10/01/2016 07/13/2016 07/11/2016  Glucose 65 - 99 mg/dL 118(H) 112(H) 339(H)  BUN 6 - 20 mg/dL 24(H) 18 41(H)  Creatinine 0.61 - 1.24 mg/dL 1.11 0.95 0.93  Sodium 135 - 145 mmol/L 134(L) 136 141  Potassium 3.5 - 5.1 mmol/L 3.5 3.4(L) 3.9  Chloride 101 - 111 mmol/L 99(L) 105 117(H)  CO2 22 - 32 mmol/L 26 27 22   Calcium 8.9 - 10.3 mg/dL 9.1 8.3(L) 7.4(L)   RIGHT FEMORAL NECK FRACTURE DISPLACED ([place] 3/4)   HE HAS SIGNIFICANT RISK BASED ON AGE AND MEDICAL HISTORY BUT, HE NEEDS SURGERY : RIGHT HIP BIPOLAR REPLACEMENT   The patient had surgery in April of this year but he was transferred to [facility] based on his medical history as mentioned above. I have discussed this with Dr.Le and he has agreed to transfer the patient to [facility] no formal consult on  [facility] 10/02/2016, 7:24 AM

## 2016-10-03 ENCOUNTER — Inpatient Hospital Stay (HOSPITAL_COMMUNITY): Payer: Medicare Other

## 2016-10-03 ENCOUNTER — Encounter (HOSPITAL_COMMUNITY)
Admission: EM | Disposition: A | Discharge status: Skilled Nursing Facility | Payer: Self-pay | Source: Home / Self Care | Attending: Internal Medicine

## 2016-10-03 ENCOUNTER — Encounter (HOSPITAL_COMMUNITY): Payer: Self-pay | Admitting: Anesthesiology

## 2016-10-03 ENCOUNTER — Inpatient Hospital Stay (HOSPITAL_COMMUNITY): Payer: Medicare Other | Admitting: Anesthesiology

## 2016-10-03 DIAGNOSIS — I10 Essential (primary) hypertension: Secondary | ICD-10-CM

## 2016-10-03 HISTORY — PX: HIP ARTHROPLASTY: SHX981

## 2016-10-03 LAB — CREATININE, SERUM
Creatinine, Ser: 0.68 mg/dL (ref 0.61–1.24)
GFR calc non Af Amer: >60 mL/min (ref >60)

## 2016-10-03 LAB — TYPE AND SCREEN
ABO/RH(D): B POS
ANTIBODY SCREEN: NEGATIVE

## 2016-10-03 LAB — CBC
HEMATOCRIT: 37.5 % — AB (ref 39.0–52.0)
Hemoglobin: 11.9 g/dL — ABNORMAL LOW (ref 13.0–17.0)
MCH: 28.1 pg (ref 26.0–34.0)
MCHC: 31.7 g/dL (ref 30.0–36.0)
MCV: 88.7 fL (ref 78.0–100.0)
PLATELETS: 325 10*3/uL (ref 150–400)
RBC: 4.23 MIL/uL (ref 4.22–5.81)
RDW: 13.4 % (ref 11.5–15.5)
WBC: 10.9 10*3/uL — AB (ref 4.0–10.5)

## 2016-10-03 LAB — GLUCOSE, CAPILLARY
GLUCOSE-CAPILLARY: 143 mg/dL — AB (ref 65–99)
GLUCOSE-CAPILLARY: 145 mg/dL — AB (ref 65–99)
Glucose-Capillary: 153 mg/dL — ABNORMAL HIGH (ref 65–99)

## 2016-10-03 SURGERY — HEMIARTHROPLASTY, HIP, DIRECT ANTERIOR APPROACH, FOR FRACTURE
Anesthesia: Spinal | Site: Hip | Laterality: Right

## 2016-10-03 MED ORDER — TRANEXAMIC ACID 1000 MG/10ML IV SOLN
2000.0000 mg | INTRAVENOUS | Status: AC
  Administered 2016-10-03: 2000 mg via TOPICAL
  Filled 2016-10-03: qty 20

## 2016-10-03 MED ORDER — PROPOFOL 10 MG/ML IV BOLUS
INTRAVENOUS | Status: AC
  Filled 2016-10-03: qty 20

## 2016-10-03 MED ORDER — POTASSIUM CHLORIDE IN NACL 20-0.9 MEQ/L-% IV SOLN
INTRAVENOUS | Status: DC
  Administered 2016-10-03 – 2016-10-04 (×2): via INTRAVENOUS
  Filled 2016-10-03 (×2): qty 1000

## 2016-10-03 MED ORDER — BUPIVACAINE HCL 0.5 % IJ SOLN
INTRAMUSCULAR | Status: DC | PRN
  Administered 2016-10-03: 30 mL

## 2016-10-03 MED ORDER — BISACODYL 10 MG RE SUPP: 10.0000 mg | Freq: Every day | RECTAL | Status: DC | PRN

## 2016-10-03 MED ORDER — CEFAZOLIN SODIUM-DEXTROSE 2-4 GM/100ML-% IV SOLN
2.0000 g | Freq: Four times a day (QID) | INTRAVENOUS | Status: AC
  Administered 2016-10-03 (×2): 2 g via INTRAVENOUS
  Filled 2016-10-03 (×2): qty 100

## 2016-10-03 MED ORDER — KETOROLAC TROMETHAMINE 30 MG/ML IJ SOLN
INTRAMUSCULAR | Status: DC | PRN
  Administered 2016-10-03: 30 mg via INTRA_ARTICULAR

## 2016-10-03 MED ORDER — SENNA 8.6 MG PO TABS
1.0000 | ORAL_TABLET | Freq: Two times a day (BID) | ORAL | Status: DC
  Administered 2016-10-03 – 2016-10-05 (×4): 8.6 mg via ORAL
  Filled 2016-10-03 (×4): qty 1

## 2016-10-03 MED ORDER — CHLORHEXIDINE GLUCONATE 4 % EX LIQD
60.0000 mL | Freq: Once | CUTANEOUS | Status: AC
  Administered 2016-10-03: 4 via TOPICAL
  Filled 2016-10-03: qty 60

## 2016-10-03 MED ORDER — ONDANSETRON HCL 4 MG/2ML IJ SOLN: 4.0000 mg | Freq: Once | INTRAMUSCULAR | Status: DC | PRN

## 2016-10-03 MED ORDER — HYDROCODONE-ACETAMINOPHEN 5-325 MG PO TABS: 1.0000 | ORAL_TABLET | Freq: Four times a day (QID) | ORAL | 0 refills | Status: DC | PRN

## 2016-10-03 MED ORDER — KETOROLAC TROMETHAMINE 30 MG/ML IJ SOLN
INTRAMUSCULAR | Status: AC
  Filled 2016-10-03: qty 1

## 2016-10-03 MED ORDER — BACLOFEN 10 MG PO TABS
10.0000 mg | ORAL_TABLET | Freq: Three times a day (TID) | ORAL | Status: DC | PRN
  Administered 2016-10-05: 10 mg via ORAL
  Filled 2016-10-03: qty 1

## 2016-10-03 MED ORDER — POLYETHYLENE GLYCOL 3350 17 G PO PACK: 17.0000 g | PACK | Freq: Every day | ORAL | Status: DC | PRN

## 2016-10-03 MED ORDER — ONDANSETRON HCL 4 MG/2ML IJ SOLN
INTRAMUSCULAR | Status: DC | PRN
  Administered 2016-10-03: 4 mg via INTRAVENOUS

## 2016-10-03 MED ORDER — MENTHOL 3 MG MT LOZG: 1.0000 | LOZENGE | OROMUCOSAL | Status: DC | PRN

## 2016-10-03 MED ORDER — ASPIRIN EC 325 MG PO TBEC: 325.0000 mg | DELAYED_RELEASE_TABLET | Freq: Every day | ORAL | 0 refills | Status: DC

## 2016-10-03 MED ORDER — DOCUSATE SODIUM 100 MG PO CAPS
100.0000 mg | ORAL_CAPSULE | Freq: Two times a day (BID) | ORAL | Status: DC
  Administered 2016-10-03 – 2016-10-05 (×4): 100 mg via ORAL
  Filled 2016-10-03 (×4): qty 1

## 2016-10-03 MED ORDER — FENTANYL CITRATE (PF) 250 MCG/5ML IJ SOLN
INTRAMUSCULAR | Status: AC
  Filled 2016-10-03: qty 5

## 2016-10-03 MED ORDER — BUPIVACAINE-EPINEPHRINE (PF) 0.25% -1:200000 IJ SOLN
INTRAMUSCULAR | Status: AC
  Filled 2016-10-03: qty 30

## 2016-10-03 MED ORDER — ENOXAPARIN SODIUM 40 MG/0.4ML ~~LOC~~ SOLN
40.0000 mg | SUBCUTANEOUS | Status: DC
  Administered 2016-10-04 – 2016-10-05 (×2): 40 mg via SUBCUTANEOUS
  Filled 2016-10-03 (×2): qty 0.4

## 2016-10-03 MED ORDER — PROMETHAZINE HCL 25 MG/ML IJ SOLN
INTRAMUSCULAR | Status: AC
  Filled 2016-10-03: qty 1

## 2016-10-03 MED ORDER — PROPOFOL 10 MG/ML IV BOLUS
INTRAVENOUS | Status: DC | PRN
  Administered 2016-10-03: 80 mg via INTRAVENOUS
  Administered 2016-10-03: 20 mg via INTRAVENOUS
  Administered 2016-10-03 (×2): 10 mg via INTRAVENOUS

## 2016-10-03 MED ORDER — ROCURONIUM BROMIDE 100 MG/10ML IV SOLN
INTRAVENOUS | Status: DC | PRN
  Administered 2016-10-03: 30 mg via INTRAVENOUS

## 2016-10-03 MED ORDER — LACTATED RINGERS IV SOLN
INTRAVENOUS | Status: DC | PRN
  Administered 2016-10-03: 07:00:00 via INTRAVENOUS

## 2016-10-03 MED ORDER — SODIUM CHLORIDE 0.9 % IJ SOLN
INTRAMUSCULAR | Status: DC | PRN
  Administered 2016-10-03: 30 mL

## 2016-10-03 MED ORDER — CEFAZOLIN SODIUM-DEXTROSE 2-4 GM/100ML-% IV SOLN
2.0000 g | INTRAVENOUS | Status: AC
Start: 2016-10-03 — End: 2016-10-03
  Administered 2016-10-03: 2 g via INTRAVENOUS

## 2016-10-03 MED ORDER — 0.9 % SODIUM CHLORIDE (POUR BTL) OPTIME
TOPICAL | Status: DC | PRN
Start: 2016-10-03 — End: 2016-10-03
  Administered 2016-10-03: 1000 mL

## 2016-10-03 MED ORDER — FENTANYL CITRATE (PF) 100 MCG/2ML IJ SOLN
INTRAMUSCULAR | Status: DC | PRN
  Administered 2016-10-03 (×4): 50 ug via INTRAVENOUS

## 2016-10-03 MED ORDER — TRANEXAMIC ACID 1000 MG/10ML IV SOLN
1000.0000 mg | INTRAVENOUS | Status: AC
  Administered 2016-10-03: 1000 mg via INTRAVENOUS
  Filled 2016-10-03: qty 10

## 2016-10-03 MED ORDER — FENTANYL CITRATE (PF) 100 MCG/2ML IJ SOLN
INTRAMUSCULAR | Status: AC
  Filled 2016-10-03: qty 2

## 2016-10-03 MED ORDER — PHENOL 1.4 % MT LIQD: 1.0000 | OROMUCOSAL | Status: DC | PRN

## 2016-10-03 MED ORDER — FENTANYL CITRATE (PF) 100 MCG/2ML IJ SOLN
25.0000 ug | INTRAMUSCULAR | Status: DC | PRN
  Administered 2016-10-03: 50 ug via INTRAVENOUS

## 2016-10-03 MED ORDER — DEXAMETHASONE SODIUM PHOSPHATE 10 MG/ML IJ SOLN
INTRAMUSCULAR | Status: DC | PRN
  Administered 2016-10-03: 10 mg via INTRAVENOUS

## 2016-10-03 MED ORDER — POVIDONE-IODINE 10 % EX SWAB: 2.0000 "application " | Freq: Once | CUTANEOUS | Status: DC

## 2016-10-03 MED ORDER — DEXTROSE 5 % IV SOLN
INTRAVENOUS | Status: DC | PRN
  Administered 2016-10-03: 50 ug/min via INTRAVENOUS

## 2016-10-03 MED ORDER — SUGAMMADEX SODIUM 200 MG/2ML IV SOLN
INTRAVENOUS | Status: DC | PRN
  Administered 2016-10-03: 120 mg via INTRAVENOUS

## 2016-10-03 MED FILL — Fentanyl Citrate Preservative Free (PF) Inj 100 MCG/2ML: INTRAMUSCULAR | Qty: 2 | Status: AC

## 2016-10-03 SURGICAL SUPPLY — 47 items
BIT DRILL 7/64X5 DISP (BIT) ×3 IMPLANT
BLADE SAGITTAL 25.0X1.27X90 (BLADE) ×2 IMPLANT
BLADE SAGITTAL 25.0X1.27X90MM (BLADE) ×1
CAPT HIP HEMI 2 ×3 IMPLANT
CLOSURE STERI-STRIP 1/2X4 (GAUZE/BANDAGES/DRESSINGS) ×1
CLSR STERI-STRIP ANTIMIC 1/2X4 (GAUZE/BANDAGES/DRESSINGS) ×3 IMPLANT
COVER SURGICAL LIGHT HANDLE (MISCELLANEOUS) ×3 IMPLANT
DRAPE ORTHO SPLIT 77X108 STRL (DRAPES) ×6
DRAPE SURG ORHT 6 SPLT 77X108 (DRAPES) ×2 IMPLANT
DRAPE U-SHAPE 47X51 STRL (DRAPES) ×3 IMPLANT
DRSG MEPILEX BORDER 4X12 (GAUZE/BANDAGES/DRESSINGS) ×2 IMPLANT
DRSG MEPILEX BORDER 4X8 (GAUZE/BANDAGES/DRESSINGS) ×3 IMPLANT
DURAPREP 26ML APPLICATOR (WOUND CARE) ×3 IMPLANT
ELECT BLADE 4.0 EZ CLEAN MEGAD (MISCELLANEOUS) ×3
ELECT CAUTERY BLADE 6.4 (BLADE) ×3 IMPLANT
ELECT REM PT RETURN 9FT ADLT (ELECTROSURGICAL) ×3
ELECTRODE BLDE 4.0 EZ CLN MEGD (MISCELLANEOUS) ×1 IMPLANT
ELECTRODE REM PT RTRN 9FT ADLT (ELECTROSURGICAL) ×1 IMPLANT
FACESHIELD WRAPAROUND (MASK) ×3 IMPLANT
FACESHIELD WRAPAROUND OR TEAM (MASK) ×1 IMPLANT
GLOVE BIO SURGEON STRL SZ7.5 (GLOVE) ×6 IMPLANT
GLOVE BIOGEL PI IND STRL 8 (GLOVE) ×2 IMPLANT
GLOVE BIOGEL PI INDICATOR 8 (GLOVE) ×4
GOWN STRL REUS W/ TWL LRG LVL3 (GOWN DISPOSABLE) ×2 IMPLANT
GOWN STRL REUS W/ TWL XL LVL3 (GOWN DISPOSABLE) ×1 IMPLANT
GOWN STRL REUS W/TWL LRG LVL3 (GOWN DISPOSABLE) ×6
GOWN STRL REUS W/TWL XL LVL3 (GOWN DISPOSABLE) ×3
HIP CAPITATED HEMI 2 IMPLANT
KIT BASIN OR (CUSTOM PROCEDURE TRAY) ×3 IMPLANT
KIT ROOM TURNOVER OR (KITS) ×3 IMPLANT
MANIFOLD NEPTUNE II (INSTRUMENTS) ×3 IMPLANT
NS IRRIG 1000ML POUR BTL (IV SOLUTION) ×3 IMPLANT
PACK TOTAL JOINT (CUSTOM PROCEDURE TRAY) ×3 IMPLANT
PAD ARMBOARD 7.5X6 YLW CONV (MISCELLANEOUS) ×6 IMPLANT
PILLOW ABDUCTION HIP (SOFTGOODS) ×3 IMPLANT
RETRIEVER SUT HEWSON (MISCELLANEOUS) ×3 IMPLANT
SLEEVE CABLE 2MM VT (Orthopedic Implant) ×2 IMPLANT
SUT FIBERWIRE #2 38 REV NDL BL (SUTURE) ×6
SUT MNCRL AB 4-0 PS2 18 (SUTURE) ×3 IMPLANT
SUT MON AB 2-0 CT1 36 (SUTURE) ×3 IMPLANT
SUT VIC AB 1 CT1 27 (SUTURE) ×6
SUT VIC AB 1 CT1 27XBRD ANBCTR (SUTURE) ×1 IMPLANT
SUT VLOC 180 0 24IN GS25 (SUTURE) ×2 IMPLANT
SUTURE FIBERWR#2 38 REV NDL BL (SUTURE) ×2 IMPLANT
TOWEL OR 17X24 6PK STRL BLUE (TOWEL DISPOSABLE) ×3 IMPLANT
TOWEL OR 17X26 10 PK STRL BLUE (TOWEL DISPOSABLE) ×3 IMPLANT
TRAY FOLEY CATH SILVER 14FR (SET/KITS/TRAYS/PACK) IMPLANT

## 2016-10-03 NOTE — H&P (View-Only) (Signed)
   ORTHOPAEDIC CONSULTATION  REQUESTING PHYSICIAN: Arrien, Mauricio Daniel,*  Chief Complaint: right hip pain after a fall approximately 1 week ago.  Assessment / Plan: Active Problems:   Type 2 diabetes mellitus with hyperglycemia, with long-term current use of insulin (HCC)   Femur fracture, right (HCC)   Closed displaced fracture of right femoral neck (HCC)  Closed right femoral neck fracture Plan for Operative this morning. Right hip hemiarthroplasty by Dr. Timothy Murphy. -NPO  -Medicine team admit and pre-op clearance -Type and Cross for 2 units held for OR -PT/OT post op -Will ammend WB status postop, bedrest for now -Foley for comfort to be removed POD 1/2 -Likely to require Rehab or SNF placement upon discharge.  -VTE prophylaxis: SCDs  Consent has been signed by his POA. I discussed the details, risks, benefits of the proposed surgery with the patient. He verbalizes wishes to undergo surgery so that he can bear weight and to alleviate pain in his hip.  HPI: Thomas [name] is a 83 y.o. male who complains of right hip pain after fall approximately 1 week ago. He was standing, moving backwards and lost his balance. He has had persistent pain.  Here through the [place] emergency department where he was evaluated and due to comorbid medical conditions transferred to [place]. Orthopedics was consulted for evaluation.  Past Medical History:  Diagnosis Date  . Anginal pain (HCC)   . Cancer (HCC)   . Diabetes mellitus   . Hyperlipemia   . Lung cancer (HCC)   . Prostate cancer (HCC)    Past Surgical History:  Procedure Laterality Date  . BACK SURGERY    . CARDIAC SURGERY    . ESOPHAGOGASTRODUODENOSCOPY N/A 07/11/2016   Procedure: ESOPHAGOGASTRODUODENOSCOPY (EGD);  Surgeon: Rehman, Najeeb U, MD;  Location: [facility];  Service: Endoscopy;  Laterality: N/A;  . INTRAMEDULLARY (IM) NAIL INTERTROCHANTERIC Left 05/04/2016   Procedure: INTRAMEDULLARY (IM) NAIL  INTERTROCHANTRIC;  Surgeon: Timothy D Murphy, MD;  Location: [facility] OR;  Service: Orthopedics;  Laterality: Left;  . PROSTATE SURGERY    . surgery to repair aneurysm    . TUMOR REMOVAL     from lung   Social History   Social History  . Marital status: Married    Spouse name: N/A  . Number of children: N/A  . Years of education: N/A   Social History Main Topics  . Smoking status: Former Smoker    Packs/day: 1.00    Years: 50.00    Types: Cigarettes    Quit date: 12/10/1993  . Smokeless tobacco: Former User    Types: Chew  . Alcohol use No  . Drug use: No  . Sexual activity: No   Other Topics Concern  . None   Social History Narrative  . None   History reviewed. No pertinent family history. Allergies  Allergen Reactions  . Nubain [Nalbuphine Hcl]     Listed on MAR-no reaction is noted   Prior to Admission medications   Medication Sig Start Date End Date Taking? Authorizing Provider  acetaminophen (TYLENOL) 500 MG tablet Take 1,000 mg by mouth every 8 (eight) hours as needed for moderate pain.   Yes [provider]  atorvastatin (LIPITOR) 40 MG tablet Take 40 mg by mouth daily.   Yes [provider]  budesonide-formoterol (SYMBICORT) 160-4.5 MCG/ACT inhaler Inhale 2 puffs into the lungs 2 (two) times daily.   Yes [provider]  citalopram (CELEXA) 10 MG tablet Take 1 tablet (10   mg total) by mouth daily. 07/10/15  Yes Hernandez Acosta, Estela Y, MD  diltiazem (CARDIZEM CD) 240 MG 24 hr capsule TAKE ONE CAPSULE ORALLY EVERY MORNING. 03/01/15  Yes Lawrence, Kathryn M, NP  donepezil (ARICEPT) 5 MG tablet Take 5 mg by mouth at bedtime.   Yes [provider]  ferrous sulfate 325 (65 FE) MG tablet Take 325 mg by mouth 2 (two) times daily with a meal.    Yes [provider]  HYDROcodone-acetaminophen (NORCO) 5-325 MG tablet Take 1-2 tablets by mouth every 6 (six) hours as needed for moderate pain. 05/06/16  Yes Martensen, Henry Calvin III, PA-C   insulin glargine (LANTUS) 100 UNIT/ML injection Inject 12 Units into the skin at bedtime.   Yes [provider]  ipratropium-albuterol (DUONEB) 0.5-2.5 (3) MG/3ML SOLN Take 3 mLs by nebulization every 6 (six) hours as needed (dyspnea cough/wheezing).   Yes [provider]  isosorbide mononitrate (IMDUR) 30 MG 24 hr tablet TAKE ONE TABLET BY MOUTH ONCE DAILY. 02/17/15  Yes Lawrence, Kathryn M, NP  Multiple Vitamins-Minerals (PRESERVISION AREDS) CAPS Take 1 capsule by mouth 2 (two) times daily.   Yes [provider]  oxybutynin (DITROPAN-XL) 10 MG 24 hr tablet Take 10 mg by mouth at bedtime.   Yes [provider]  pantoprazole (PROTONIX) 40 MG tablet Take 1 tablet (40 mg total) by mouth 2 (two) times daily. 07/13/16  Yes Le, Peter, MD  tiotropium (SPIRIVA) 18 MCG inhalation capsule Place 18 mcg into inhaler and inhale daily.   Yes [provider]  Vitamin D, Ergocalciferol, (DRISDOL) 50000 units CAPS capsule Take 50,000 Units by mouth every 7 (seven) days.   Yes [provider]  amoxicillin-clavulanate (AUGMENTIN) 875-125 MG tablet Take 1 tablet by mouth every 12 (twelve) hours. Patient not taking: Reported on 08/14/2016 07/13/16   Le, Peter, MD  senna (SENOKOT) 8.6 MG TABS tablet Take 1 tablet (8.6 mg total) by mouth 2 (two) times daily as needed for mild constipation. 05/06/16   Grunz, Ryan B, MD   Dg Chest 1 View  Result Date: 10/01/2016 CLINICAL DATA:  Preop right hip fracture.  Patient fell last week. EXAM: CHEST 1 VIEW COMPARISON:  07/10/2016 CXR FINDINGS: Status post CABG with aortic atherosclerosis. Borderline cardiomegaly is noted. The patient is status post kyphoplasty at the thoracolumbar junction. Minimal atelectasis and/or scarring is seen at the right lung base. There may be a tiny right effusion blunting the right costophrenic angle. Emphysematous hyperinflation of the lungs with chronic scarring in the right upper lobe. No pneumonic  consolidation. IMPRESSION: 1. Postop appearance of the right lung with scarring. 2. Minimal blunting the right costophrenic angle may reflect a small effusion pleural thickening. 3. Status post CABG. Electronically Signed   By: David  Kwon M.D.   On: 10/01/2016 22:25   Us Venous Img Lower Bilateral  Result Date: 10/02/2016 CLINICAL DATA:  Right hip fracture after fall 1 week ago. EXAM: BILATERAL LOWER EXTREMITY VENOUS DOPPLER ULTRASOUND TECHNIQUE: Gray-scale sonography with graded compression, as well as color Doppler and duplex ultrasound were performed to evaluate the lower extremity deep venous systems from the level of the common femoral vein and including the common femoral, femoral, profunda femoral, popliteal and calf veins including the posterior tibial, peroneal and gastrocnemius veins when visible. The superficial great saphenous vein was also interrogated. Spectral Doppler was utilized to evaluate flow at rest and with distal augmentation maneuvers in the common femoral, femoral and popliteal veins. COMPARISON:  None. FINDINGS: RIGHT   LOWER EXTREMITY Common Femoral Vein: No evidence of thrombus. Normal compressibility, respiratory phasicity and response to augmentation. Saphenofemoral Junction: No evidence of thrombus. Normal compressibility and flow on color Doppler imaging. Profunda Femoral Vein: No evidence of thrombus. Normal compressibility and flow on color Doppler imaging. Femoral Vein: No evidence of thrombus. Normal compressibility, respiratory phasicity and response to augmentation. Popliteal Vein: No evidence of thrombus. Normal compressibility, respiratory phasicity and response to augmentation. Calf Veins: No evidence of thrombus. Normal compressibility and flow on color Doppler imaging. Superficial Great Saphenous Vein: No evidence of thrombus. Normal compressibility and flow on color Doppler imaging. Venous Reflux:  None. Other Findings:  None. LEFT LOWER EXTREMITY Common Femoral Vein:  No evidence of thrombus. Normal compressibility, respiratory phasicity and response to augmentation. Saphenofemoral Junction: No evidence of thrombus. Normal compressibility and flow on color Doppler imaging. Profunda Femoral Vein: No evidence of thrombus. Normal compressibility and flow on color Doppler imaging. Femoral Vein: No evidence of thrombus. Normal compressibility, respiratory phasicity and response to augmentation. Popliteal Vein: No evidence of thrombus. Normal compressibility, respiratory phasicity and response to augmentation. Calf Veins: No evidence of thrombus. Normal compressibility and flow on color Doppler imaging. Superficial Great Saphenous Vein: No evidence of thrombus. Normal compressibility and flow on color Doppler imaging. Venous Reflux:  None. Other Findings:  None. IMPRESSION: No evidence of DVT within either lower extremity. Electronically Signed   By: James  Green Jr, M.D.   On: 10/02/2016 14:33   Dg Knee Complete 4 Views Right  Result Date: 10/01/2016 CLINICAL DATA:  Right hip pain after fall last week. EXAM: RIGHT KNEE - COMPLETE 4+ VIEW COMPARISON:  None. FINDINGS: Osteoarthritic joint space narrowing of the patellofemoral and femorotibial compartments without joint effusion, fracture or malalignment. Vascular clips are seen about the medial aspect of right leg. IMPRESSION: Osteoarthritis of the right knee without acute osseous appearing abnormality. Electronically Signed   By: David  Kwon M.D.   On: 10/01/2016 22:26   Dg Hip Unilat W Or Wo Pelvis 2-3 Views Right  Result Date: 10/01/2016 CLINICAL DATA:  Right hip pain after fall last week. EXAM: DG HIP (WITH OR WITHOUT PELVIS) 2-3V RIGHT COMPARISON:  07/10/2016 CT scout radiograph FINDINGS: An acute, closed, varus angulated transcervical fracture of the right femur is noted. No hip dislocation. Partially included left femoral nail fixation of the left femur is intact. No acute osseous abnormality of the bony pelvis. Status  post radical prostatectomy with numerous surgical clips in lower pelvis bilaterally. There is lower lumbar spondylosis. Penile prosthesis is noted. IMPRESSION: Acute, varus angulated transcervical fracture of the right femoral neck. Electronically Signed   By: David  Kwon M.D.   On: 10/01/2016 22:23    Positive ROS: All other systems have been reviewed and were otherwise negative with the exception of those mentioned in the HPI and as above.  Objective: Labs cbc  Recent Labs  10/01/16 2200  WBC 8.6  HGB 11.7*  HCT 35.1*  PLT 285    Labs inflam No results for input(s): CRP in the last 72 hours.  Invalid input(s): ESR  Labs coag No results for input(s): INR, PTT in the last 72 hours.  Invalid input(s): PT   Recent Labs  10/01/16 2200  NA 134*  K 3.5  CL 99*  CO2 26  GLUCOSE 118*  BUN 24*  CREATININE 1.11  CALCIUM 9.1    Physical Exam: Vitals:   10/03/16 0359 10/03/16 0539  BP: (!) 110/57 (!) 122/48  Pulse: 76 66    Resp: 16   Temp: 98.7 F (37.1 C) 98.4 F (36.9 C)  SpO2: 99% 98%   General: Alert, no acute distress Mental status: Alert and Oriented x3 Neurologic: Speech Clear and organized, no gross focal findings or movement disorder appreciated. Respiratory: No cyanosis, no use of accessory musculature Cardiovascular: No pedal edema GI: Abdomen is soft and non-tender, non-distended. Skin: Warm and dry.  No lesions in the area of chief complaint . Extremities: Warm and well perfused w/o edema Psychiatric: Patient is interactive and responsive appropriately to questions.  MUSCULOSKELETAL:  Right lower extremity without lesion or ecchymosis. Compartments soft. Sensation intact distally. Neurovascularly intact - EHL, FHL dorsiflexion, plantar flexion intact. Other extremities are atraumatic with painless ROM and NVI.   Henry Calvin Martensen III PA-C 10/03/2016 6:40 AM 

## 2016-10-03 NOTE — Interval H&P Note (Signed)
History and Physical Interval Note:  10/03/2016 7:34 AM  Thomas [name]  has presented today for surgery, with the diagnosis of RIGHT HIP FRACTURE  The various methods of treatment have been discussed with the patient and family. After consideration of risks, benefits and other options for treatment, the patient has consented to  Procedure(s): ARTHROPLASTY BIPOLAR HIP (HEMIARTHROPLASTY) (Right) as a surgical intervention .  The patient's history has been reviewed, patient examined, no change in status, stable for surgery.  I have reviewed the patient's chart and labs.  Questions were answered to the patient's satisfaction.     Eternity Dexter D

## 2016-10-03 NOTE — Progress Notes (Signed)
Orthopedic Tech Progress Note Patient Details:  Thomas [name] 28-Mar-1927 [id number]  Ortho Devices Type of Ortho Device: Knee Immobilizer Ortho Device/Splint Interventions: Application   [name] 10/03/2016, 10:13 AM

## 2016-10-03 NOTE — Progress Notes (Signed)
Agaitated/confused/trying to remove adduction pillow  / PA at bedside, aware, states ok to use knee immobilizer on affected leg

## 2016-10-03 NOTE — Social Work (Addendum)
CSW contacted [facility] and spoke with [place] in admissions and confirmed patients from SNF and that he can return for skilled nursing.  CSW will f/u with legal guardian.  CSW contacted Netta Cedars of [place] and she indicated that she is his legal guardian. She is in agreement with patient's return to [facility] for skilled nursing. She will need CSW to fax DC summary to [phone] when received.  CSW will continue to follow.  Elissa Hefty, LCSW Clinical Social Worker [phone]

## 2016-10-03 NOTE — Consult Note (Signed)
ORTHOPAEDIC CONSULTATION  REQUESTING PHYSICIAN: Arrien, Thomas [name],*  Chief Complaint: right hip pain after a fall approximately 1 week ago.  Assessment / Plan: Active Problems:   Type 2 diabetes mellitus with hyperglycemia, with long-term current use of insulin (HCC)   Femur fracture, right (HCC)   Closed displaced fracture of right femoral neck (HCC)  Closed right femoral neck fracture Plan for Operative this morning. Right hip hemiarthroplasty by Dr. [name]. -NPO  -Medicine team admit and pre-op clearance -Type and Cross for 2 units held for OR -PT/OT post op -Will ammend WB status postop, bedrest for now -Foley for comfort to be removed POD 1/2 -Likely to require Rehab or SNF placement upon discharge.  -VTE prophylaxis: SCDs  Consent has been signed by his POA. I discussed the details, risks, benefits of the proposed surgery with the patient. He verbalizes wishes to undergo surgery so that he can bear weight and to alleviate pain in his hip.  HPI: Thomas [name] is a 83 y.o. male who complains of right hip pain after fall approximately 1 week ago. He was standing, moving backwards and lost his balance. He has had persistent pain.  Here through the [facility] emergency department where he was evaluated and due to comorbid medical conditions transferred to [facility] cone. Orthopedics was consulted for evaluation.  Past Medical History:  Diagnosis Date  . Anginal pain ([place])   . Cancer ([place])   . Diabetes mellitus   . Hyperlipemia   . Lung cancer ([place])   . Prostate cancer [facility])    Past Surgical History:  Procedure Laterality Date  . BACK SURGERY    . CARDIAC SURGERY    . ESOPHAGOGASTRODUODENOSCOPY N/A 07/11/2016   Procedure: ESOPHAGOGASTRODUODENOSCOPY (EGD);  Surgeon: Rogene Houston, MD;  Location: [facility];  Service: Endoscopy;  Laterality: N/A;  . INTRAMEDULLARY (IM) NAIL INTERTROCHANTERIC Left 05/04/2016   Procedure: INTRAMEDULLARY (IM) NAIL  INTERTROCHANTRIC;  Surgeon: Renette Butters, MD;  Location: [facility];  Service: Orthopedics;  Laterality: Left;  . PROSTATE SURGERY    . surgery to repair aneurysm    . TUMOR REMOVAL     from lung   Social History   Social History  . Marital status: Married    Spouse name: N/A  . Number of children: N/A  . Years of education: N/A   Social History Main Topics  . Smoking status: Former Smoker    Packs/day: 1.00    Years: 50.00    Types: Cigarettes    Quit date: 12/10/1993  . Smokeless tobacco: Former Systems developer    Types: Chew  . Alcohol use No  . Drug use: No  . Sexual activity: No   Other Topics Concern  . None   Social History Narrative  . None   History reviewed. No pertinent family history. Allergies  Allergen Reactions  . Nubain [Nalbuphine Hcl]     Listed on MAR-no reaction is noted   Prior to Admission medications   Medication Sig Start Date End Date Taking? Authorizing Provider  acetaminophen (TYLENOL) 500 MG tablet Take 1,000 mg by mouth every 8 (eight) hours as needed for moderate pain.   Yes [provider]  atorvastatin (LIPITOR) 40 MG tablet Take 40 mg by mouth daily.   Yes [provider]  budesonide-formoterol (SYMBICORT) 160-4.5 MCG/ACT inhaler Inhale 2 puffs into the lungs 2 (two) times daily.   Yes [provider]  citalopram (CELEXA) 10 MG tablet Take 1 tablet (10  mg total) by mouth daily. 07/10/15  Yes Isaac Bliss, Rayford Halsted, MD  diltiazem (CARDIZEM CD) 240 MG 24 hr capsule TAKE ONE CAPSULE ORALLY EVERY MORNING. 03/01/15  Yes Lendon Colonel, NP  donepezil (ARICEPT) 5 MG tablet Take 5 mg by mouth at bedtime.   Yes [provider]  ferrous sulfate 325 (65 FE) MG tablet Take 325 mg by mouth 2 (two) times daily with a meal.    Yes [provider]  HYDROcodone-acetaminophen (NORCO) 5-325 MG tablet Take 1-2 tablets by mouth every 6 (six) hours as needed for moderate pain. 05/06/16  Yes Prudencio Burly III, PA-C   insulin glargine (LANTUS) 100 UNIT/ML injection Inject 12 Units into the skin at bedtime.   Yes [provider]  ipratropium-albuterol (DUONEB) 0.5-2.5 (3) MG/3ML SOLN Take 3 mLs by nebulization every 6 (six) hours as needed (dyspnea cough/wheezing).   Yes [provider]  isosorbide mononitrate (IMDUR) 30 MG 24 hr tablet TAKE ONE TABLET BY MOUTH ONCE DAILY. 02/17/15  Yes Lendon Colonel, NP  Multiple Vitamins-Minerals (PRESERVISION AREDS) CAPS Take 1 capsule by mouth 2 (two) times daily.   Yes [provider]  oxybutynin (DITROPAN-XL) 10 MG 24 hr tablet Take 10 mg by mouth at bedtime.   Yes [provider]  pantoprazole (PROTONIX) 40 MG tablet Take 1 tablet (40 mg total) by mouth 2 (two) times daily. 07/13/16  Yes Orvan Falconer, MD  tiotropium (SPIRIVA) 18 MCG inhalation capsule Place 18 mcg into inhaler and inhale daily.   Yes [provider]  Vitamin D, Ergocalciferol, (DRISDOL) 50000 units CAPS capsule Take 50,000 Units by mouth every 7 (seven) days.   Yes [provider]  amoxicillin-clavulanate (AUGMENTIN) 875-125 MG tablet Take 1 tablet by mouth every 12 (twelve) hours. Patient not taking: Reported on 08/14/2016 07/13/16   Orvan Falconer, MD  senna (SENOKOT) 8.6 MG TABS tablet Take 1 tablet (8.6 mg total) by mouth 2 (two) times daily as needed for mild constipation. 05/06/16   Patrecia Pour, MD   Dg Chest 1 View  Result Date: 10/01/2016 CLINICAL DATA:  Preop right hip fracture.  Patient fell last week. EXAM: CHEST 1 VIEW COMPARISON:  07/10/2016 CXR FINDINGS: Status post CABG with aortic atherosclerosis. Borderline cardiomegaly is noted. The patient is status post kyphoplasty at the thoracolumbar junction. Minimal atelectasis and/or scarring is seen at the right lung base. There may be a tiny right effusion blunting the right costophrenic angle. Emphysematous hyperinflation of the lungs with chronic scarring in the right upper lobe. No pneumonic  consolidation. IMPRESSION: 1. Postop appearance of the right lung with scarring. 2. Minimal blunting the right costophrenic angle may reflect a small effusion pleural thickening. 3. Status post CABG. Electronically Signed   By: Ashley Royalty M.D.   On: 10/01/2016 22:25   US Venous Img Lower Bilateral  Result Date: 10/02/2016 CLINICAL DATA:  Right hip fracture after fall 1 week ago. EXAM: BILATERAL LOWER EXTREMITY VENOUS DOPPLER ULTRASOUND TECHNIQUE: Gray-scale sonography with graded compression, as well as color Doppler and duplex ultrasound were performed to evaluate the lower extremity deep venous systems from the level of the common femoral vein and including the common femoral, femoral, profunda femoral, popliteal and calf veins including the posterior tibial, peroneal and gastrocnemius veins when visible. The superficial great saphenous vein was also interrogated. Spectral Doppler was utilized to evaluate flow at rest and with distal augmentation maneuvers in the common femoral, femoral and popliteal veins. COMPARISON:  None. FINDINGS: RIGHT  LOWER EXTREMITY Common Femoral Vein: No evidence of thrombus. Normal compressibility, respiratory phasicity and response to augmentation. Saphenofemoral Junction: No evidence of thrombus. Normal compressibility and flow on color Doppler imaging. Profunda Femoral Vein: No evidence of thrombus. Normal compressibility and flow on color Doppler imaging. Femoral Vein: No evidence of thrombus. Normal compressibility, respiratory phasicity and response to augmentation. Popliteal Vein: No evidence of thrombus. Normal compressibility, respiratory phasicity and response to augmentation. Calf Veins: No evidence of thrombus. Normal compressibility and flow on color Doppler imaging. Superficial Great Saphenous Vein: No evidence of thrombus. Normal compressibility and flow on color Doppler imaging. Venous Reflux:  None. Other Findings:  None. LEFT LOWER EXTREMITY Common Femoral Vein:  No evidence of thrombus. Normal compressibility, respiratory phasicity and response to augmentation. Saphenofemoral Junction: No evidence of thrombus. Normal compressibility and flow on color Doppler imaging. Profunda Femoral Vein: No evidence of thrombus. Normal compressibility and flow on color Doppler imaging. Femoral Vein: No evidence of thrombus. Normal compressibility, respiratory phasicity and response to augmentation. Popliteal Vein: No evidence of thrombus. Normal compressibility, respiratory phasicity and response to augmentation. Calf Veins: No evidence of thrombus. Normal compressibility and flow on color Doppler imaging. Superficial Great Saphenous Vein: No evidence of thrombus. Normal compressibility and flow on color Doppler imaging. Venous Reflux:  None. Other Findings:  None. IMPRESSION: No evidence of DVT within either lower extremity. Electronically Signed   By: Marijo Conception, M.D.   On: 10/02/2016 14:33   Dg Knee Complete 4 Views Right  Result Date: 10/01/2016 CLINICAL DATA:  Right hip pain after fall last week. EXAM: RIGHT KNEE - COMPLETE 4+ VIEW COMPARISON:  None. FINDINGS: Osteoarthritic joint space narrowing of the patellofemoral and femorotibial compartments without joint effusion, fracture or malalignment. Vascular clips are seen about the medial aspect of right leg. IMPRESSION: Osteoarthritis of the right knee without acute osseous appearing abnormality. Electronically Signed   By: Ashley Royalty M.D.   On: 10/01/2016 22:26   Dg Hip Unilat W Or Wo Pelvis 2-3 Views Right  Result Date: 10/01/2016 CLINICAL DATA:  Right hip pain after fall last week. EXAM: DG HIP (WITH OR WITHOUT PELVIS) 2-3V RIGHT COMPARISON:  07/10/2016 CT scout radiograph FINDINGS: An acute, closed, varus angulated transcervical fracture of the right femur is noted. No hip dislocation. Partially included left femoral nail fixation of the left femur is intact. No acute osseous abnormality of the bony pelvis. Status  post radical prostatectomy with numerous surgical clips in lower pelvis bilaterally. There is lower lumbar spondylosis. Penile prosthesis is noted. IMPRESSION: Acute, varus angulated transcervical fracture of the right femoral neck. Electronically Signed   By: Ashley Royalty M.D.   On: 10/01/2016 22:23    Positive ROS: All other systems have been reviewed and were otherwise negative with the exception of those mentioned in the HPI and as above.  Objective: Labs cbc  Recent Labs  10/01/16 2200  WBC 8.6  HGB 11.7*  HCT 35.1*  PLT 285    Labs inflam No results for input(s): CRP in the last 72 hours.  Invalid input(s): ESR  Labs coag No results for input(s): INR, PTT in the last 72 hours.  Invalid input(s): PT   Recent Labs  10/01/16 2200  NA 134*  K 3.5  CL 99*  CO2 26  GLUCOSE 118*  BUN 24*  CREATININE 1.11  CALCIUM 9.1    Physical Exam: Vitals:   10/03/16 0359 10/03/16 0539  BP: (!) 110/57 (!) 122/48  Pulse: 76 66  Resp: 16   Temp: 98.7 F (37.1 C) 98.4 F (36.9 C)  SpO2: 99% 98%   General: Alert, no acute distress Mental status: Alert and Oriented x3 Neurologic: Speech Clear and organized, no gross focal findings or movement disorder appreciated. Respiratory: No cyanosis, no use of accessory musculature Cardiovascular: No pedal edema GI: Abdomen is soft and non-tender, non-distended. Skin: Warm and dry.  No lesions in the area of chief complaint . Extremities: Warm and well perfused w/o edema Psychiatric: Patient is interactive and responsive appropriately to questions.  MUSCULOSKELETAL:  Right lower extremity without lesion or ecchymosis. Compartments soft. Sensation intact distally. Neurovascularly intact - EHL, FHL dorsiflexion, plantar flexion intact. Other extremities are atraumatic with painless ROM and NVI.   Prudencio Burly III PA-C 10/03/2016 6:40 AM

## 2016-10-03 NOTE — Anesthesia Procedure Notes (Signed)
Spinal  Patient location during procedure: OR Staffing Anesthesiologist: Renold Don E Performed: anesthesiologist  Preanesthetic Checklist Completed: patient identified, surgical consent, pre-op evaluation, timeout performed, IV checked, risks and benefits discussed and monitors and equipment checked Spinal Block Patient position: left lateral decubitus Prep: DuraPrep Patient monitoring: heart rate, cardiac monitor, continuous pulse ox and blood pressure Approach: midline Location: L2-3 Injection technique: single-shot Needle Needle type: Quincke  Needle gauge: 22 G Assessment Events: failed spinal Additional Notes Functioning IV was confirmed and monitors were applied. Sterile prep and drape, including hand hygiene, mask, and sterile gloves were used. The patient was positioned and the spine was prepped. The skin was anesthetized with lidocaine. Patient in left lateral decubitus, as unable to tolerate being in right lateral decubitus. Poor positioning despite best efforts, unable to obtain CSF after attempts at 2 spaces, opted to abort procedure and go with general anesthesia. Surgeon in room and aware. The patient tolerated the procedure well. Consent was obtained prior to the procedure with all questions answered and concerns addressed. Risks including, but not limited to, bleeding, infection, nerve damage, paralysis, failed block, inadequate analgesia, allergic reaction, high spinal, itching, and headache were discussed and the patient wished to proceed.  Renold Don, MD

## 2016-10-03 NOTE — Transfer of Care (Signed)
Immediate Anesthesia Transfer of Care Note  Patient: Thomas [name]  Procedure(s) Performed: Procedure(s): ARTHROPLASTY BIPOLAR HIP (HEMIARTHROPLASTY) with cabeling (Right)  Patient Location: PACU  Anesthesia Type:General  Level of Consciousness: awake, alert  and oriented  Airway & Oxygen Therapy: Patient Spontanous Breathing and Patient connected to nasal cannula oxygen  Post-op Assessment: Report given to RN, Post -op Vital signs reviewed and stable and Patient moving all extremities X 4  Post vital signs: Reviewed and stable  Last Vitals:  Vitals:   10/03/16 0359 10/03/16 0539  BP: (!) 110/57 (!) 122/48  Pulse: 76 66  Resp: 16   Temp: 37.1 C 36.9 C  SpO2: 99% 98%    Last Pain:  Vitals:   10/03/16 0539  TempSrc: Oral  PainSc:          Complications: No apparent anesthesia complications

## 2016-10-03 NOTE — Anesthesia Procedure Notes (Signed)
Procedure Name: MAC Date/Time: 10/03/2016 7:35 AM Performed by: Neldon Newport Oxygen Delivery Method: Simple face mask

## 2016-10-03 NOTE — Op Note (Signed)
10/01/2016 - 10/03/2016  1:16 PM  PATIENT:  Thomas [name]   MRN: [id number]  PRE-OPERATIVE DIAGNOSIS:  RIGHT HIP FRACTURE  POST-OPERATIVE DIAGNOSIS:  RIGHT HIP FRACTURE  PROCEDURE:  Procedure(s): ARTHROPLASTY BIPOLAR HIP (HEMIARTHROPLASTY) with cabeling  PREOPERATIVE INDICATIONS:  Thomas [name] is an 83 y.o. male who was admitted 10/01/2016 with a diagnosis of Closed displaced fracture of right femoral neck ([place]) and elected for surgical management.  The risks benefits and alternatives were discussed with the patient including but not limited to the risks of nonoperative treatment, versus surgical intervention including infection, bleeding, nerve injury, periprosthetic fracture, the need for revision surgery, dislocation, leg length discrepancy, blood clots, cardiopulmonary complications, morbidity, mortality, among others, and they were willing to proceed.  Predicted outcome is good, although there will be at least a six to nine month expected recovery.   OPERATIVE REPORT     SURGEON:  [name], MD    ASSISTANT:  Roxan Hockey, PA-C, he was present and scrubbed throughout the case, critical for completion in a timely fashion, and for retraction, instrumentation, and closure.     ANESTHESIA:  General    COMPLICATIONS:  None.      COMPONENTS:  [employer] Acolade: Femoral stem: 7, Femoral Head:52, Neck:-4   PROCEDURE IN DETAIL: The patient was met in the holding area and identified.  The appropriate hip  was marked at the operative site. The patient was then transported to the OR and  placed under general anesthesia.  At that point, the patient was  placed in the lateral decubitus position with the operative side up and  secured to the operating room table and all bony prominences padded.     The operative lower extremity was prepped from the iliac crest to the toes.  Sterile draping was performed.  Time out was performed prior to incision.      A routine posterolateral  approach was utilized via sharp dissection  carried down to the subcutaneous tissue.  Gross bleeders were Bovie  coagulated.  The iliotibial band was identified and incised  along the length of the skin incision.  Self-retaining retractors were  inserted. I examined the bursa there was significant hematoma and edema I performed a bursectomy here.  With the hip internally rotated, the short external rotators  were identified. The piriformis was tagged with FiberWire, and the hip capsule released in a T-type fashion.  The femoral neck was exposed, and I resected the femoral neck using the appropriate jig. This was performed at approximately a thumb's breadth above the lesser trochanter.    I then exposed the deep acetabulum, cleared out any tissue including the ligamentum teres, and included the hip capsule in the FiberWire used above and below the T.    I then prepared the proximal femur using the cookie-cutter, the lateralizing reamer, and then sequentially broached.  A trial utilized, and I reduced the hip and it was found to have excellent stability with functional range of motion. The trial components were then removed.   The canal and acetabulum were thoroughly irrigated  I inserted the pressfit stem and placed the head and neck collar. I noted a crack in the calcar so I placed a dahl miles cable around this and confirmed stability of the implant. The hip was reduced with appropriate force and was stable through a range of motion.   I then used a 2 mm drill bits to pass the FiberWire suture from the capsule and puriform is through the greater  trochanter, and secured this. Excellent posterior capsular repair was achieved. I also closed the T in the capsule.  I then irrigated the hip copiously again with pulse lavage, and repaired the fascia with Vicryl, followed by Vicryl for the subcutaneous tissue, Monocryl for the skin, Steri-Strips and sterile gauze. The wounds were injected. The patient was  then awakened and returned to PACU in stable and satisfactory condition. There were no complications.  POST-OP PLAN: Weight bearing as tolerated. DVT px will consist of SCD's and chemical px  [name], MD Orthopedic Surgeon [phone]   10/03/2016 1:16 PM

## 2016-10-03 NOTE — Clinical Social Work Note (Signed)
Clinical Social Work Assessment  Patient Details  Name: Thomas [name] MRN: [id number] Date of Birth: 1927/04/23  Date of referral:  10/03/16               Reason for consult:  Facility Placement                Permission sought to share information with:    Permission granted to share information::     Name::        Agency::  SNF- [facility]  Relationship::     Contact Information:     Housing/Transportation Living arrangements for the past 2 months:  [place] of Information:  Facility, Spouse Patient Interpreter Needed:  None Criminal Activity/Legal Involvement Pertinent to Current Situation/Hospitalization:  No - Comment as needed Significant Relationships:  Spouse Lives with:  Facility Resident Do you feel safe going back to the place where you live?  Yes Need for family participation in patient care:  Yes (Comment)  Care giving concerns:  Pt had a fall at [facility] and fractured right femur. Pt has a legal guardian from [place]. Pt can return to [facility] for short term rehab per legal guardian and admission staff at [facility].  Social Worker assessment / plan:  CSW will facilitate transition back to SNF once medically ready. CSW will complete FL2, confirm passr, and send offer to [facility] once PT/OT has evaluated patient.  Employment status:  Retired Nurse, adult PT Recommendations:  [place] / Referral to community resources:  [place]  Patient/Family's Response to care:  Patient only oriented to person. Legal guardian in agreement with transition back to SNF when ready. No issues or concerns identified at this time.  Patient/Family's Understanding of and Emotional Response to Diagnosis, Current Treatment, and Prognosis:  Legal guardian has good understanding of diagnosis, current treatment and prognosis. Hopeful patient will return to  baseline. No issues or concerns identified at this time.  Emotional Assessment Appearance:  Appears stated age Attitude/Demeanor/Rapport:   (Cooperative) Affect (typically observed):    Orientation:  Oriented to Self Alcohol / Substance use:  Not Applicable Psych involvement (Current and /or in the community):  No (Comment)  Discharge Needs  Concerns to be addressed:  Care Coordination Readmission within the last 30 days:    Current discharge risk:  Physical Impairment, Dependent with Mobility Barriers to Discharge:  No Barriers Identified   Normajean Baxter, LCSW 10/03/2016, 11:56 AM

## 2016-10-03 NOTE — Progress Notes (Signed)
Pt has refused all repositioning and cold application throughout night. Will continue to offer.

## 2016-10-03 NOTE — Anesthesia Postprocedure Evaluation (Signed)
Anesthesia Post Note  Patient: Thomas [name]  Procedure(s) Performed: Procedure(s) (LRB): ARTHROPLASTY BIPOLAR HIP (HEMIARTHROPLASTY) with cabeling (Right)     Patient location during evaluation: PACU Anesthesia Type: General Level of consciousness: awake and alert Pain management: pain level controlled Vital Signs Assessment: post-procedure vital signs reviewed and stable Respiratory status: spontaneous breathing, nonlabored ventilation, respiratory function stable and patient connected to nasal cannula oxygen Cardiovascular status: blood pressure returned to baseline and stable Postop Assessment: no signs of nausea or vomiting Anesthetic complications: no    Last Vitals:  Vitals:   10/03/16 1015 10/03/16 1048  BP:  130/60  Pulse:  74  Resp:    Temp: 36.6 C 36.4 C  SpO2:  100%    Last Pain:  Vitals:   10/03/16 1100  TempSrc:   PainSc: 10-Worst pain ever                 Audry Pili

## 2016-10-03 NOTE — Progress Notes (Signed)
PROGRESS NOTE   Thomas [name]  OYD:[id number]    DOB: 1927-05-12    DOA: 10/01/2016  PCP: Hilbert Corrigan, MD   I have briefly reviewed patients previous medical records in [facility].  Brief Narrative:  83 year old male with PMH of DM 2, HLD, lung cancer, prostate cancer, initially presented to [facility] due to right hip pain after a mechanical fall at the nursing home week prior to admission. Noted to have right hip fracture. Surgeon at [facility] recommended transferring to [facility] due to comorbid conditions. Status post right hip hemiarthroplasty by Dr. [name] on 8/30.   Assessment & Plan:   Principal Problem:   Closed displaced fracture of right femoral neck (HCC) Active Problems:   Type 2 diabetes mellitus with hyperglycemia, with long-term current use of insulin (HCC)   Femur fracture, right (HCC)   1. Right hip fracture: Sustained after a mechanical fall. Orthopedics was consulted and patient underwent right hip hemiarthroplasty on 10/03/16. Management per orthopedics. Lower extremity venous Dopplers negative for DVT. 2. Type II DM: CBG currently reasonable in the 130-140 range. Home dose of Lantus 12 units at bedtime currently on hold. Continue SSI. Consider resuming Lantus even at a reduced dose if CBG start to increase. A1c: 5.7. 3. Hyperlipidemia: Continue statins. 4. Essential hypertension: Continue Cardizem CD. Controlled. 5. COPD: Stable without clinical bronchospasm. Continue home regimen and when necessary bronchodilator nebulizations. 6. CAD status post CABG, RBB, LAFB: Stable without reported chest pain. Continue Imdur and statins. Patient on diltiazem for unclear indication> ? For HTN 7. Dementia: Continue Aricept. 8. History of lung cancer 9. Anemia: Stable. Follow CBC in a.m.   DVT prophylaxis: Lovenox Code Status: Full Family Communication: None at bedside Disposition: DC to SNF when medically  stable.   Consultants:  Orthopedics   Procedures:  Right hip hemiarthroplasty 8/30. Foley catheter.  Antimicrobials:  None    Subjective:  Seen this morning after he returned from the operating room. Somnolence/sedated but arousable. Oriented to self but not saying much. As per RN, was in significant amount of pain which improved after providing pain medications.   ROS:  Unable  Objective:  Vitals:   10/03/16 1003 10/03/16 1011 10/03/16 1015 10/03/16 1048  BP: (!) 144/65 (!) 144/65  130/60  Pulse: 73 80  74  Resp: 10 19    Temp:   97.8 F (36.6 C) 97.6 F (36.4 C)  TempSrc:    Oral  SpO2: 97% 98%  100%  Weight:      Height:        Examination:  General exam: Pleasant elderly male, moderately built and frail, lying comfortably supine in bed. Oral mucosa moist. Respiratory system: Clear to auscultation/poor inspiratory effort. No increased work of breathing. Cardiovascular system: S1 & S2 heard, RRR. No JVD, murmurs, rubs, gallops or clicks. No pedal edema. Gastrointestinal system: Abdomen is nondistended, soft and nontender. No organomegaly or masses felt. Normal bowel sounds heard. Foley catheter +. Central nervous system: mental status as indicated above. No focal neurological deficits. Extremities: right lower extremity with knee immobilizer. Surgical site dressing clean and dry. Skin: No rashes, lesions or ulcers Psychiatry: Judgement and insight impaired. Mood & affect cannot be assessed at this time.     Data Reviewed: I have personally reviewed following labs and imaging studies  CBC:  Recent Labs Lab 10/01/16 2200 10/03/16 1131  WBC 8.6 10.9*  NEUTROABS 6.0  --   HGB 11.7* 11.9*  HCT 35.1* 37.5*  MCV 87.8 88.7  PLT 285 544   Basic Metabolic Panel:  Recent Labs Lab 10/01/16 2200 10/03/16 1131  NA 134*  --   K 3.5  --   CL 99*  --   CO2 26  --   GLUCOSE 118*  --   BUN 24*  --   CREATININE 1.11 0.68  CALCIUM 9.1  --    Liver Function  Tests:  Recent Labs Lab 10/01/16 2200  AST 17  ALT 15*  ALKPHOS 59  BILITOT 0.5  PROT 6.3*  ALBUMIN 3.2*   Coagulation Profile: No results for input(s): INR, PROTIME in the last 168 hours. Cardiac Enzymes: No results for input(s): CKTOTAL, CKMB, CKMBINDEX, TROPONINI in the last 168 hours. HbA1C:  Recent Labs  10/01/16 2200  HGBA1C 5.7*   CBG:  Recent Labs Lab 10/02/16 1115 10/02/16 1641 10/02/16 2103 10/03/16 1208  GLUCAP 99 139* 140* 143*    Recent Results (from the past 240 hour(s))  MRSA PCR Screening     Status: None   Collection Time: 10/02/16  9:53 PM  Result Value Ref Range Status   MRSA by PCR NEGATIVE NEGATIVE Final    Comment:        The GeneXpert MRSA Assay (FDA approved for NASAL specimens only), is one component of a comprehensive MRSA colonization surveillance program. It is not intended to diagnose MRSA infection nor to guide or monitor treatment for MRSA infections.          Radiology Studies: Dg Chest 1 View  Result Date: 10/01/2016 CLINICAL DATA:  Preop right hip fracture.  Patient fell last week. EXAM: CHEST 1 VIEW COMPARISON:  07/10/2016 CXR FINDINGS: Status post CABG with aortic atherosclerosis. Borderline cardiomegaly is noted. The patient is status post kyphoplasty at the thoracolumbar junction. Minimal atelectasis and/or scarring is seen at the right lung base. There may be a tiny right effusion blunting the right costophrenic angle. Emphysematous hyperinflation of the lungs with chronic scarring in the right upper lobe. No pneumonic consolidation. IMPRESSION: 1. Postop appearance of the right lung with scarring. 2. Minimal blunting the right costophrenic angle may reflect a small effusion pleural thickening. 3. Status post CABG. Electronically Signed   By: Ashley Royalty M.D.   On: 10/01/2016 22:25   US Venous Img Lower Bilateral  Result Date: 10/02/2016 CLINICAL DATA:  Right hip fracture after fall 1 week ago. EXAM: BILATERAL LOWER  EXTREMITY VENOUS DOPPLER ULTRASOUND TECHNIQUE: Gray-scale sonography with graded compression, as well as color Doppler and duplex ultrasound were performed to evaluate the lower extremity deep venous systems from the level of the common femoral vein and including the common femoral, femoral, profunda femoral, popliteal and calf veins including the posterior tibial, peroneal and gastrocnemius veins when visible. The superficial great saphenous vein was also interrogated. Spectral Doppler was utilized to evaluate flow at rest and with distal augmentation maneuvers in the common femoral, femoral and popliteal veins. COMPARISON:  None. FINDINGS: RIGHT LOWER EXTREMITY Common Femoral Vein: No evidence of thrombus. Normal compressibility, respiratory phasicity and response to augmentation. Saphenofemoral Junction: No evidence of thrombus. Normal compressibility and flow on color Doppler imaging. Profunda Femoral Vein: No evidence of thrombus. Normal compressibility and flow on color Doppler imaging. Femoral Vein: No evidence of thrombus. Normal compressibility, respiratory phasicity and response to augmentation. Popliteal Vein: No evidence of thrombus. Normal compressibility, respiratory phasicity and response to augmentation. Calf Veins: No evidence of thrombus. Normal compressibility and flow on color Doppler imaging. Superficial  Great Saphenous Vein: No evidence of thrombus. Normal compressibility and flow on color Doppler imaging. Venous Reflux:  None. Other Findings:  None. LEFT LOWER EXTREMITY Common Femoral Vein: No evidence of thrombus. Normal compressibility, respiratory phasicity and response to augmentation. Saphenofemoral Junction: No evidence of thrombus. Normal compressibility and flow on color Doppler imaging. Profunda Femoral Vein: No evidence of thrombus. Normal compressibility and flow on color Doppler imaging. Femoral Vein: No evidence of thrombus. Normal compressibility, respiratory phasicity and  response to augmentation. Popliteal Vein: No evidence of thrombus. Normal compressibility, respiratory phasicity and response to augmentation. Calf Veins: No evidence of thrombus. Normal compressibility and flow on color Doppler imaging. Superficial Great Saphenous Vein: No evidence of thrombus. Normal compressibility and flow on color Doppler imaging. Venous Reflux:  None. Other Findings:  None. IMPRESSION: No evidence of DVT within either lower extremity. Electronically Signed   By: Marijo Conception, M.D.   On: 10/02/2016 14:33   Dg Knee Complete 4 Views Right  Result Date: 10/01/2016 CLINICAL DATA:  Right hip pain after fall last week. EXAM: RIGHT KNEE - COMPLETE 4+ VIEW COMPARISON:  None. FINDINGS: Osteoarthritic joint space narrowing of the patellofemoral and femorotibial compartments without joint effusion, fracture or malalignment. Vascular clips are seen about the medial aspect of right leg. IMPRESSION: Osteoarthritis of the right knee without acute osseous appearing abnormality. Electronically Signed   By: Ashley Royalty M.D.   On: 10/01/2016 22:26   Dg Hip Unilat W Or Wo Pelvis 2-3 Views Right  Result Date: 10/01/2016 CLINICAL DATA:  Right hip pain after fall last week. EXAM: DG HIP (WITH OR WITHOUT PELVIS) 2-3V RIGHT COMPARISON:  07/10/2016 CT scout radiograph FINDINGS: An acute, closed, varus angulated transcervical fracture of the right femur is noted. No hip dislocation. Partially included left femoral nail fixation of the left femur is intact. No acute osseous abnormality of the bony pelvis. Status post radical prostatectomy with numerous surgical clips in lower pelvis bilaterally. There is lower lumbar spondylosis. Penile prosthesis is noted. IMPRESSION: Acute, varus angulated transcervical fracture of the right femoral neck. Electronically Signed   By: Ashley Royalty M.D.   On: 10/01/2016 22:23        Scheduled Meds: . atorvastatin  40 mg Oral Daily  . citalopram  10 mg Oral Daily  .  diltiazem  240 mg Oral Daily  . docusate sodium  100 mg Oral BID  . donepezil  5 mg Oral QHS  . [START ON 10/04/2016] enoxaparin (LOVENOX) injection  40 mg Subcutaneous Q24H  . fentaNYL      . insulin aspart  0-9 Units Subcutaneous TID WC  . isosorbide mononitrate  30 mg Oral Daily  . mometasone-formoterol  2 puff Inhalation BID  . oxybutynin  10 mg Oral QHS  . pantoprazole  40 mg Oral BID  . promethazine      . senna  1 tablet Oral BID  . tiotropium  18 mcg Inhalation Daily  . Vitamin D (Ergocalciferol)  50,000 Units Oral Q7 days   Continuous Infusions: . 0.9 % NaCl with KCl 20 mEq / L    .  ceFAZolin (ANCEF) IV       LOS: 1 day     Ziyah Cordoba, MD, FACP, FHM. [facility] Pager [phone]  If 7PM-7AM, please contact night-coverage [email or web address] Password [email or web address] 10/03/2016, 1:27 PM

## 2016-10-03 NOTE — Progress Notes (Signed)
Report given to [facility] and Louie Casa, Therapist, sports at [facility]. Pt and vitals in stable condition. Consents for blood and surgery sent with [facility] to be taken to [facility].

## 2016-10-04 ENCOUNTER — Inpatient Hospital Stay (HOSPITAL_COMMUNITY): Payer: Medicare Other

## 2016-10-04 ENCOUNTER — Encounter (HOSPITAL_COMMUNITY): Payer: Self-pay | Admitting: Orthopedic Surgery

## 2016-10-04 LAB — CBC
HCT: 34.5 % — ABNORMAL LOW (ref 39.0–52.0)
Hemoglobin: 11.2 g/dL — ABNORMAL LOW (ref 13.0–17.0)
MCH: 28.6 pg (ref 26.0–34.0)
MCHC: 32.5 g/dL (ref 30.0–36.0)
MCV: 88.2 fL (ref 78.0–100.0)
PLATELETS: 312 10*3/uL (ref 150–400)
RBC: 3.91 MIL/uL — AB (ref 4.22–5.81)
RDW: 13.4 % (ref 11.5–15.5)
WBC: 9.7 10*3/uL (ref 4.0–10.5)

## 2016-10-04 LAB — BASIC METABOLIC PANEL
Anion gap: 6 (ref 5–15)
BUN: 23 mg/dL — AB (ref 6–20)
CHLORIDE: 107 mmol/L (ref 101–111)
CO2: 27 mmol/L (ref 22–32)
Calcium: 9.2 mg/dL (ref 8.9–10.3)
Creatinine, Ser: 1.07 mg/dL (ref 0.61–1.24)
GFR calc Af Amer: >60 mL/min (ref >60)
GLUCOSE: 131 mg/dL — AB (ref 65–99)
POTASSIUM: 4.2 mmol/L (ref 3.5–5.1)
Sodium: 140 mmol/L (ref 135–145)

## 2016-10-04 LAB — GLUCOSE, CAPILLARY
GLUCOSE-CAPILLARY: 116 mg/dL — AB (ref 65–99)
GLUCOSE-CAPILLARY: 127 mg/dL — AB (ref 65–99)
Glucose-Capillary: 103 mg/dL — ABNORMAL HIGH (ref 65–99)
Glucose-Capillary: 149 mg/dL — ABNORMAL HIGH (ref 65–99)

## 2016-10-04 MED ORDER — ENSURE ENLIVE PO LIQD
237.0000 mL | Freq: Two times a day (BID) | ORAL | Status: DC
  Administered 2016-10-04 – 2016-10-05 (×2): 237 mL via ORAL

## 2016-10-04 NOTE — Evaluation (Addendum)
Physical Therapy Evaluation Patient Details Name: Thomas [name] MRN: [id number] DOB: 01-Feb-1928 Today's Date: 10/04/2016   History of Present Illness  Pt is an 83 y.o. male admitted most fall at SNF sustaining R femoral fx; now s/p R hemiarthroplasty on 10/03/16 and WBAT with posterior precautions. Pertinent PMH includes DM, lung CA, previous LLE fx w/ intertrochanteric IM nail post-fall (04/2016).  Clinical Impression  Pt presents with pain and an overall decrease in functional mobility secondary to above. Pt with history of cognitive deficits, and no family present to determine baseline. PTA, pt lives at [facility]. Pt required minA and repeated cues to maintain posterior hip precautions secondary to decreased memory and awareness. Able to transfer and amb with RW and minA (+2 for safety). Pt would benefit from continued acute PT services to maximize functional mobility and independence prior to d/c with SNF-level therapies.      Follow Up Recommendations SNF;Supervision for mobility/OOB    Equipment Recommendations  Rolling walker with 5" wheels    Recommendations for Other Services       Precautions / Restrictions Precautions Precautions: Fall;Posterior Hip Precaution Booklet Issued: Yes (comment) Precaution Comments: Pt demonstrates decreased awareness for recalling precautions Required Braces or Orthoses: Knee Immobilizer - Right Restrictions Weight Bearing Restrictions: Yes RLE Weight Bearing: Weight bearing as tolerated      Mobility  Bed Mobility Overal bed mobility: Needs Assistance Bed Mobility: Supine to Sit     Supine to sit: Min assist;HOB elevated     General bed mobility comments: Increased time and repeated multimodal cues required for pt to attend to and stay on task for mobility  Transfers Overall transfer level: Needs assistance Equipment used: Rolling walker (2 wheeled) Transfers: Sit to/from Stand Sit to Stand: Min assist;+2 safety/equipment          General transfer comment: Stood with RW and minA+2 for safety; required increased time and repeated cues for awareness to task. Pt with decreased awareness and unable to recall precautions, requiring minA to maintain with mobility  Ambulation/Gait Ambulation/Gait assistance: Min assist;+2 safety/equipment Ambulation Distance (Feet): 100 Feet (+50) Assistive device: Rolling walker (2 wheeled) Gait Pattern/deviations: Step-through pattern;Antalgic Gait velocity: Decreased Gait velocity interpretation: <1.8 ft/sec, indicative of risk for recurrent falls General Gait Details: Amb with RW and minA+2 for safety; repeated cues for upright posture and to keep RW closer to body during amb; 1x standing rest break secondary to fatigue. Increased time and repeated cues for attention to task. Pt with decreased awareness and unable to recall precautions, requiring minA to maintain with mobility  Stairs            Wheelchair Mobility    Modified Rankin (Stroke Patients Only)       Balance Overall balance assessment: Needs assistance Sitting-balance support: No upper extremity supported;Feet supported Sitting balance-Leahy Scale: Fair     Standing balance support: Bilateral upper extremity supported;During functional activity Standing balance-Leahy Scale: Poor                               Pertinent Vitals/Pain Pain Assessment: Faces Faces Pain Scale: Hurts a little bit Pain Location: R hip Pain Descriptors / Indicators: Aching;Discomfort Pain Intervention(s): Limited activity within patient's tolerance;Monitored during session    Home Living Family/patient expects to be discharged to:: Skilled nursing facility                 Additional Comments: Per SW note, from [facility]  SNF    Prior Function Level of Independence: Independent with assistive device(s)               Hand Dominance        Extremity/Trunk Assessment   Upper Extremity  Assessment Upper Extremity Assessment: Defer to OT evaluation    Lower Extremity Assessment Lower Extremity Assessment: RLE deficits/detail RLE Deficits / Details: RLE grossly 3/5       Communication   Communication: HOH  Cognition Arousal/Alertness: Awake/alert Behavior During Therapy: WFL for tasks assessed/performed Overall Cognitive Status: History of cognitive impairments - at baseline Area of Impairment: Orientation;Attention;Memory;Following commands;Safety/judgement;Awareness                 Orientation Level: Disoriented to;Place Current Attention Level: Sustained Memory: Decreased short-term memory;Decreased recall of precautions Following Commands: Follows multi-step commands inconsistently Safety/Judgement: Decreased awareness of safety;Decreased awareness of deficits Awareness: Emergent   General Comments: History of dementia at baseline      General Comments      Exercises     Assessment/Plan    PT Assessment Patient needs continued PT services  PT Problem List Decreased strength;Decreased range of motion;Decreased activity tolerance;Decreased balance;Decreased mobility;Decreased coordination;Decreased cognition;Decreased knowledge of use of DME;Decreased safety awareness;Decreased knowledge of precautions;Pain       PT Treatment Interventions DME instruction;Gait training;Stair training;Functional mobility training;Therapeutic exercise;Therapeutic activities;Balance training;Patient/family education    PT Goals (Current goals can be found in the Care Plan section)  Acute Rehab PT Goals Patient Stated Goal: Get back to walking around PT Goal Formulation: With patient Time For Goal Achievement: 10/18/16 Potential to Achieve Goals: Good    Frequency Min 3X/week   Barriers to discharge        Co-evaluation PT/OT/SLP Co-Evaluation/Treatment: Yes Reason for Co-Treatment: Necessary to address cognition/behavior during functional activity;For  patient/therapist safety;To address functional/ADL transfers PT goals addressed during session: Mobility/safety with mobility;Proper use of DME OT goals addressed during session: ADL's and self-care (functional mobility)       AM-PAC PT "6 Clicks" Daily Activity  Outcome Measure Difficulty turning over in bed (including adjusting bedclothes, sheets and blankets)?: A Little Difficulty moving from lying on back to sitting on the side of the bed? : A Little Difficulty sitting down on and standing up from a chair with arms (e.g., wheelchair, bedside commode, etc,.)?: A Little Help needed moving to and from a bed to chair (including a wheelchair)?: A Little Help needed walking in hospital room?: A Little Help needed climbing 3-5 steps with a railing? : A Little 6 Click Score: 18    End of Session Equipment Utilized During Treatment: Gait belt Activity Tolerance: Patient tolerated treatment well Patient left: in chair;with call bell/phone within reach;with chair alarm set Nurse Communication: Mobility status PT Visit Diagnosis: Other abnormalities of gait and mobility (R26.89);History of falling (Z91.81);Pain Pain - Right/Left: Right Pain - part of body: Hip    Time: 5681-2751 PT Time Calculation (min) (ACUTE ONLY): 23 min   Charges:   PT Evaluation $PT Eval Moderate Complexity: 1 Mod     PT G Codes:       Mabeline Caras, PT, DPT Acute Rehab Services  Pager: [place] 10/04/2016, 11:25 AM

## 2016-10-04 NOTE — Progress Notes (Signed)
PROGRESS NOTE    Thomas [name]  LKG:[id number] DOB: 1927/12/09 DOA: 10/01/2016 PCP: Hilbert Corrigan, MD    Brief Narrative:  83 year old male presented with right hip pain. Patient is known to have diabetes mellitus type 2, dyslipidemia, lung cancer, prostate cancer. Patient suffered a mechanical fall at the nursing facility about a week prior to hospitalization. Reports persistent pain and difficulty ambulating. On the physical examination blood pressure 146/65, heart rate 62, temperature 97.8, respiratory rate 18, oxygen saturation 99%. Moist mucous membranes, lungs were clear to auscultation bilaterally, heart S1-S2 present rhythmic, no gallops or murmurs,abdomen was soft nontender, no lower extremity edema Positive tenderness at right hip joint, decreased range of motion due to pain. Sodium 134, potassium 3.5, chloride 99, bicarbonate 26, glucose 118, BUN 24, creatinine 1.11, white count 8.6, hemoglobin 11.7, hematocrit 35.1, platelets 285, urine analysis negative for infection. EKG sinus rhythm with a right bundle branch block, chest x-ray with bibasilar atelectasis. Ultrasonography of the lower extremities negative for deep vein thrombosis. Hip x-rays with acute transcervical fracture of the right femoral neck.   Patient admitted with working diagnosis of right femoral neck fracture.   Assessment & Plan:   Principal Problem:   Closed displaced fracture of right femoral neck (HCC) Active Problems:   Type 2 diabetes mellitus with hyperglycemia, with long-term current use of insulin (HCC)   Femur fracture, right (HCC)   1. Right femoral neck fracture.  SP arthroplasty, 08/30, will continue physical therapy, and dev prophylaxis, patient will return to snf, patient has a legal guardian from the [employer]. Pain is well controlled.   2. T2DM. Capillary glucose has been stable 153, 145, 103, 116. Will continue insulin sliding scale for glucose cover and  monitoring,  3. HTN. Will continue blood pressure control with diltiazem and isosorbide, blood pressure continue to be controlled  4. Coronary artery disease. Continue atorvastatin.   Will plan to discharge to SNF in am.    DVT prophylaxis: enoxaparin  Code Status: full Family Communication: No family at the bedside  Disposition Plan: snf   Consultants:   Othopedics  Procedures:     Antimicrobials:      Subjective: Patient feeling better, reports that pain is well controlled, no dyspnea or chest pain. No nausea or vomiting, and tolerating po well.   Objective: Vitals:   10/03/16 1600 10/03/16 1951 10/03/16 2340 10/04/16 0430  BP: (!) 131/57 (!) 119/46 (!) 115/48 (!) 102/42  Pulse: 71 73 69 72  Resp:      Temp: 98.3 F (36.8 C) 98.5 F (36.9 C) 98.7 F (37.1 C) 98.1 F (36.7 C)  TempSrc: Axillary Oral Oral Oral  SpO2: 98% 98% 96% 97%  Weight:      Height:        Intake/Output Summary (Last 24 hours) at 10/04/16 1143 Last data filed at 10/04/16 0900  Gross per 24 hour  Intake           366.25 ml  Output             1526 ml  Net         -1159.75 ml   Filed Weights   10/01/16 2120 10/02/16 0920  Weight: 66.2 kg (146 lb) 63.5 kg (139 lb 15.9 oz)    Examination:  General: not in pain or dyspnea Neurology: Awake and alert, non focal  E ENT: no pallor, no icterus, oral mucosa moist Cardiovascular: S1-S2 present, rhythmic, no gallops, rubs, or murmurs.  No jugular venous distention, no lower extremity edema. Pulmonary: vesicular breath sounds bilaterally, adequate air movement, no wheezing, rhonchi or rales. Gastrointestinal. Abdomen flat, no organomegaly, non tender, no rebound or guarding Skin. No rashes Musculoskeletal: no joint deformities     Data Reviewed: I have personally reviewed following labs and imaging studies  CBC:  Recent Labs Lab 10/01/16 2200 10/03/16 1131 10/04/16 0353  WBC 8.6 10.9* 9.7  NEUTROABS 6.0  --   --   HGB 11.7*  11.9* 11.2*  HCT 35.1* 37.5* 34.5*  MCV 87.8 88.7 88.2  PLT 285 325 081   Basic Metabolic Panel:  Recent Labs Lab 10/01/16 2200 10/03/16 1131 10/04/16 0353  NA 134*  --  140  K 3.5  --  4.2  CL 99*  --  107  CO2 26  --  27  GLUCOSE 118*  --  131*  BUN 24*  --  23*  CREATININE 1.11 0.68 1.07  CALCIUM 9.1  --  9.2   GFR: Estimated Creatinine Clearance: 44.5 mL/min (by C-G formula based on SCr of 1.07 mg/dL). Liver Function Tests:  Recent Labs Lab 10/01/16 2200  AST 17  ALT 15*  ALKPHOS 59  BILITOT 0.5  PROT 6.3*  ALBUMIN 3.2*   No results for input(s): LIPASE, AMYLASE in the last 168 hours. No results for input(s): AMMONIA in the last 168 hours. Coagulation Profile: No results for input(s): INR, PROTIME in the last 168 hours. Cardiac Enzymes: No results for input(s): CKTOTAL, CKMB, CKMBINDEX, TROPONINI in the last 168 hours. BNP (last 3 results) No results for input(s): PROBNP in the last 8760 hours. HbA1C:  Recent Labs  10/01/16 2200  HGBA1C 5.7*   CBG:  Recent Labs Lab 10/03/16 1208 10/03/16 1618 10/03/16 1954 10/04/16 0550 10/04/16 1113  GLUCAP 143* 153* 145* 103* 116*   Lipid Profile: No results for input(s): CHOL, HDL, LDLCALC, TRIG, CHOLHDL, LDLDIRECT in the last 72 hours. Thyroid Function Tests: No results for input(s): TSH, T4TOTAL, FREET4, T3FREE, THYROIDAB in the last 72 hours. Anemia Panel: No results for input(s): VITAMINB12, FOLATE, FERRITIN, TIBC, IRON, RETICCTPCT in the last 72 hours.    Radiology Studies: I have reviewed all of the imaging during this hospital visit personally     Scheduled Meds: . atorvastatin  40 mg Oral Daily  . citalopram  10 mg Oral Daily  . diltiazem  240 mg Oral Daily  . docusate sodium  100 mg Oral BID  . donepezil  5 mg Oral QHS  . enoxaparin (LOVENOX) injection  40 mg Subcutaneous Q24H  . insulin aspart  0-9 Units Subcutaneous TID WC  . isosorbide mononitrate  30 mg Oral Daily  .  mometasone-formoterol  2 puff Inhalation BID  . oxybutynin  10 mg Oral QHS  . pantoprazole  40 mg Oral BID  . senna  1 tablet Oral BID  . tiotropium  18 mcg Inhalation Daily  . Vitamin D (Ergocalciferol)  50,000 Units Oral Q7 days   Continuous Infusions: . 0.9 % NaCl with KCl 20 mEq / L 75 mL/hr at 10/03/16 1403     LOS: 2 days        Tawni Millers, MD [facility] Pager [phone]

## 2016-10-04 NOTE — Social Work (Signed)
CSW spoke with Thomas [name] in [place] at [facility] and confirmed a weekend DC. She will cover this  weekend and can be reached at [phone].  CSW will continue to follow.  Elissa Hefty, LCSW Clinical Social Worker [phone]

## 2016-10-04 NOTE — Care Management Note (Signed)
Case Management Note  Patient Details  Name: Thomas [name] MRN: [id number] Date of Birth: 1927/02/23  Subjective/Objective:                 R hip fx, from [facility]. CSW following and plan is for patient to return at DC   Action/Plan:   Expected Discharge Date:                  Expected Discharge Plan:  Thomas [name]  In-House Referral:  Clinical Social Work  Discharge planning Services  CM Consult  Post Acute Care Choice:    Choice offered to:     DME Arranged:    DME Agency:     HH Arranged:    Ryan Agency:     Status of Service:  In process, will continue to follow  If discussed at Long Length of Stay Meetings, dates discussed:    Additional Comments:  Carles Collet, RN 10/04/2016, 10:07 AM

## 2016-10-04 NOTE — Progress Notes (Signed)
Pt removed knee immobilizer and has moved to his left side. RN educated about remaining on back and not crossing his legs. Pt states he cannot lie on his back due to pain. RN gave pt pain medication, but pt still reports severe pain. MD notified.

## 2016-10-04 NOTE — Progress Notes (Signed)
Initial Nutrition Assessment  DOCUMENTATION CODES:   Severe malnutrition in context of chronic illness  INTERVENTION:  - Ensure Enlive po BID, each supplement provides 350 kcal and 20 grams of protein  NUTRITION DIAGNOSIS:   Malnutrition (Severe) related to chronic illness as evidenced by severe depletion of muscle mass, severe depletion of body fat, percent weight loss (9% in <3 months).  GOAL:   Patient will meet greater than or equal to 90% of their needs  MONITOR:   PO intake, Supplement acceptance, Weight trends, I & O's  REASON FOR ASSESSMENT:   Consult Hip fracture protocol  ASSESSMENT:   Pt with PMH of DM, lung and prostate cancer, HLD, hx of left hip surgery 04/2016 presents s/p fall at SNF resulting in closed displace fracture of right femoral neck. Pt s/p hemiarthroplasty on 10/03/16   Discussed pt with RN. RN reports pt has been eating good.  Pt poor historian and hard of hearing.   Per chart review pt has consumed ~50-100% of meals this admission.   Per chart review pt has had significant weight loss of 14 lbs (9% in less than 3 months)  Pt amenable to nutritional supplementation while admitted.   Labs reviewed; CBG 103-153 Medications reviewed; Colace, Sliding scale insulin, Protonix, Senokot, Vitamin D  Nutrition focused physical exam completed findings include moderate to severe fat depletion, moderate to severe muscle depletion and no edema.   Diet Order:  Diet Carb Modified Fluid consistency: Thin; Room service appropriate? Yes  Skin:  Wound (see comment) (Incisions at both hips)  Last BM:  Unknown BM Date  Height:   Ht Readings from Last 1 Encounters:  10/02/16 5\' 11"  (1.803 m)    Weight:   Wt Readings from Last 1 Encounters:  10/02/16 139 lb 15.9 oz (63.5 kg)    Ideal Body Weight:  78 kg  BMI:  Body mass index is 19.52 kg/m.  Estimated Nutritional Needs:   Kcal:  1500-1700  Protein:  85-100 grams  Fluid:  >/= 1.5  L/d  EDUCATION NEEDS:   Education needs no appropriate at this time  [place] Ranger, MS, RDN, LDN 10/04/2016 4:10 PM

## 2016-10-04 NOTE — Evaluation (Signed)
Occupational Therapy Evaluation Patient Details Name: Thomas [name] MRN: [id number] DOB: 1927-03-24 Today's Date: 10/04/2016    History of Present Illness Pt is an 83 y.o. male admitted most fall at SNF sustaining R femoral fx; now s/p R hemiarthroplasty on 10/03/16 and WBAT with posterior precautions. Pertinent PMH includes DM, lung CA, previous LLE fx w/ intertrochanteric IM nail post-fall (04/2016).   Clinical Impression   Pt presents with pain and decreased independence with ADLs and functional mobility.  Pt with history of cognitive deficits. PTA, pt lives at SNF [facility]) with plans to return to SNF per chart review. Completed functional mobility and LB ADLs with +2 min assist (+2 for safety) and completes UB ADLs seated with min assist.  Recommend pt return to SNF where further he can receive 24/7 assist/supervision. Also recommend OT at next venue of care. Will discharge from acute OT services.    Follow Up Recommendations  Supervision/Assistance - 24 hour;SNF    Equipment Recommendations  None recommended by OT    Recommendations for Other Services       Precautions / Restrictions Precautions Precautions: Fall;Posterior Hip Precaution Booklet Issued: Yes (comment) Precaution Comments: Pt demonstrates decreased awareness for recalling precautions Required Braces or Orthoses: Knee Immobilizer - Right Restrictions Weight Bearing Restrictions: Yes RLE Weight Bearing: Weight bearing as tolerated      Mobility Bed Mobility Overal bed mobility: Needs Assistance Bed Mobility: Supine to Sit     Supine to sit: Min assist;HOB elevated     General bed mobility comments: Increased time and repeated multimodal cues required for pt to attend to and stay on task for mobility  Transfers Overall transfer level: Needs assistance Equipment used: Rolling walker (2 wheeled) Transfers: Sit to/from Stand Sit to Stand: Min assist;+2 safety/equipment         General  transfer comment: Stood with RW and minA+2 for safety; required increased time and repeated cues for awareness to task. Pt with decreased awareness and unable to recall precautions, requiring minA to maintain with mobility    Balance Overall balance assessment: Needs assistance Sitting-balance support: No upper extremity supported;Feet supported Sitting balance-Leahy Scale: Fair     Standing balance support: Bilateral upper extremity supported;During functional activity Standing balance-Leahy Scale: Poor                             ADL either performed or assessed with clinical judgement   ADL Overall ADL's : Needs assistance/impaired Eating/Feeding: Set up;Sitting   Grooming: Minimal assistance;Sitting   Upper Body Bathing: Minimal assistance;Sitting   Lower Body Bathing: Minimal assistance;Sit to/from stand;+2 for safety/equipment   Upper Body Dressing : Minimal assistance;Sitting   Lower Body Dressing: +2 for safety/equipment;Minimal assistance   Toilet Transfer: +2 for safety/equipment;Minimal assistance           Functional mobility during ADLs: +2 for safety/equipment;Minimal assistance;Rolling walker General ADL Comments: Pt very distracted conversationally during session.       Vision         Perception     Praxis      Pertinent Vitals/Pain Pain Assessment: Faces Faces Pain Scale: Hurts a little bit Pain Location: R hip Pain Descriptors / Indicators: Aching;Discomfort Pain Intervention(s): Monitored during session     Hand Dominance     Extremity/Trunk Assessment Upper Extremity Assessment Upper Extremity Assessment: Overall WFL for tasks assessed   Lower Extremity Assessment Lower Extremity Assessment: RLE deficits/detail RLE Deficits / Details: RLE grossly  3/5       Communication Communication Communication: HOH   Cognition Arousal/Alertness: Awake/alert Behavior During Therapy: WFL for tasks assessed/performed Overall  Cognitive Status: History of cognitive impairments - at baseline Area of Impairment: Orientation;Attention;Memory;Following commands;Safety/judgement;Awareness                 Orientation Level: Disoriented to;Place;Situation Current Attention Level: Sustained Memory: Decreased short-term memory;Decreased recall of precautions Following Commands: Follows multi-step commands inconsistently Safety/Judgement: Decreased awareness of safety;Decreased awareness of deficits Awareness: Emergent   General Comments: History of dementia at baseline   General Comments       Exercises     Shoulder Instructions      Home Living Family/patient expects to be discharged to:: Skilled nursing facility                                 Additional Comments: Per SW note, from [facility]      Prior Functioning/Environment Level of Independence: Independent with assistive device(s)        Comments: Unable to determine based on patient cognition.        OT Problem List: Decreased strength;Impaired balance (sitting and/or standing);Decreased range of motion;Decreased cognition;Decreased coordination;Decreased safety awareness;Decreased knowledge of use of DME or AE;Decreased knowledge of precautions;Pain      OT Treatment/Interventions:      OT Goals(Current goals can be found in the care plan section) Acute Rehab OT Goals Patient Stated Goal: Get back to walking around  OT Frequency:     Barriers to D/C:            Co-evaluation PT/OT/SLP Co-Evaluation/Treatment: Yes Reason for Co-Treatment: Necessary to address cognition/behavior during functional activity;For patient/therapist safety;To address functional/ADL transfers PT goals addressed during session: Mobility/safety with mobility;Proper use of DME OT goals addressed during session: ADL's and self-care (functional mobility)      AM-PAC PT "6 Clicks" Daily Activity     Outcome Measure Help from another  person eating meals?: None Help from another person taking care of personal grooming?: A Little Help from another person toileting, which includes using toliet, bedpan, or urinal?: A Lot Help from another person bathing (including washing, rinsing, drying)?: A Lot Help from another person to put on and taking off regular upper body clothing?: A Little Help from another person to put on and taking off regular lower body clothing?: A Lot 6 Click Score: 16   End of Session Equipment Utilized During Treatment: Gait belt;Rolling walker;Right knee immobilizer Nurse Communication: Mobility status  Activity Tolerance: Patient tolerated treatment well Patient left: in chair;with call bell/phone within reach;with chair alarm set  OT Visit Diagnosis: Unsteadiness on feet (R26.81);Muscle weakness (generalized) (M62.81);Pain Pain - Right/Left: Right Pain - part of body: Hip                Time: 1308-6578 OT Time Calculation (min): 23 min Charges:  OT General Charges $OT Visit: 1 Visit OT Evaluation $OT Eval Low Complexity: 1 Low G-Codes:       Darrol Jump OTR/L 10/04/2016, 11:38 AM

## 2016-10-04 NOTE — Progress Notes (Signed)
   Assessment / Plan: 1 Day Post-Op  S/P Procedure(s) (LRB): ARTHROPLASTY BIPOLAR HIP (HEMIARTHROPLASTY) with cabeling (Right) by Dr. Ernesta Amble. Percell Miller on 10/03/2016  Principal Problem:   Closed displaced fracture of right femoral neck ([place]) Active Problems:   Type 2 diabetes mellitus with hyperglycemia, with long-term current use of insulin (HCC)   Femur fracture, right (HCC)   Right femoral neck fracture Pain improved Status post right hemiarthroplasty Up with therapy Incentive Spirometry Apply ice  Weight Bearing: Weight Bearing as Tolerated (WBAT) RLE Dressings: prn.  VTE prophylaxis: Lovenox, SCDs, ambulation Dispo: Skilled Nursing Facility/Rehab after therapy evaluation and when cleared from a medical perspective.  Subjective: Patient reports pain as moderate and improved.  Tolerating diet.  Urinating.  No CP, SOB.  Not yet OOB.  Objective:   VITALS:   Vitals:   10/03/16 1600 10/03/16 1951 10/03/16 2340 10/04/16 0430  BP: (!) 131/57 (!) 119/46 (!) 115/48 (!) 102/42  Pulse: 71 73 69 72  Resp:      Temp: 98.3 F (36.8 C) 98.5 F (36.9 C) 98.7 F (37.1 C) 98.1 F (36.7 C)  TempSrc: Axillary Oral Oral Oral  SpO2: 98% 98% 96% 97%  Weight:      Height:       CBC Latest Ref Rng & Units 10/04/2016 10/03/2016 10/01/2016  WBC 4.0 - 10.5 K/uL 9.7 10.9(H) 8.6  Hemoglobin 13.0 - 17.0 g/dL 11.2(L) 11.9(L) 11.7(L)  Hematocrit 39.0 - 52.0 % 34.5(L) 37.5(L) 35.1(L)  Platelets 150 - 400 K/uL 312 325 285   BMP Latest Ref Rng & Units 10/04/2016 10/03/2016 10/01/2016  Glucose 65 - 99 mg/dL 131(H) - 118(H)  BUN 6 - 20 mg/dL 23(H) - 24(H)  Creatinine 0.61 - 1.24 mg/dL 1.07 0.68 1.11  Sodium 135 - 145 mmol/L 140 - 134(L)  Potassium 3.5 - 5.1 mmol/L 4.2 - 3.5  Chloride 101 - 111 mmol/L 107 - 99(L)  CO2 22 - 32 mmol/L 27 - 26  Calcium 8.9 - 10.3 mg/dL 9.2 - 9.1   Intake/Output      08/30 0701 - 08/31 0700 08/31 0701 - 09/01 0700   I.V. (mL/kg) 982 (15.5)    IV Piggyback 210    Total Intake(mL/kg) 1192 (18.8)    Urine (mL/kg/hr) 1675 (1.1)    Blood 250    Total Output 1925     Net -733             Physical Exam: General: NAD.  Upright in bed. Calm. Responds appropriately to questions.No increased work of breathing. MSK RLE: Neurovascularly intact Sensation intact distally Feet warm Dorsiflexion/Plantar flexion intact Incision: dressing C/D/I   Prudencio Burly III, PA-C 10/04/2016, 7:33 AM

## 2016-10-04 NOTE — NC FL2 (Signed)
[place] LEVEL OF CARE SCREENING TOOL     IDENTIFICATION  Patient Name: Thomas [name] Birthdate: 10-14-1927 Sex: male Admission Date (Current Location): 10/01/2016  [facility] and [place] Number:  Herbalist and Address:  [facility], [place] [address], [place], [place] [address]      Provider Number: [id number]  Attending Physician Name and Address:  Tawni Millers  Relative Name and Phone Number:       Current Level of Care: Hospital Recommended Level of Care: [place] Prior Approval Number:    Date Approved/Denied: 10/03/16 PASRR Number: [id number]  Discharge Plan: SNF    Current Diagnoses: Patient Active Problem List   Diagnosis Date Noted  . Femur fracture, right ([place]) 10/02/2016  . Closed displaced fracture of right femoral neck ([place]) 10/02/2016  . Anemia 07/13/2016  . UGIB (upper gastrointestinal bleed)   . Hematemesis 07/11/2016  . Hemoptysis 07/10/2016  . HCAP (healthcare-associated pneumonia) 07/10/2016  . Protein-calorie malnutrition, severe 05/05/2016  . Nondisplaced intertrochanteric fracture of left femur, initial encounter for closed fracture ([place]) 05/03/2016  . Type 2 diabetes mellitus with hyperglycemia, with long-term current use of insulin ([place]) 05/03/2016  . Type 1 diabetes mellitus with hyperlipidemia ([place]) 05/03/2016  . Essential hypertension 05/03/2016  . Altered mental status 07/06/2015  . Normocytic anemia 07/06/2015  . Hypotension 07/06/2015  . Anxiety 07/06/2015  . Arterial hypotension   . Abnormal laboratory test result 11/01/2012  . Epigastric pain 09/30/2012  . NSTEMI (non-ST elevated myocardial infarction) ([place]) 09/30/2012  . Acute renal failure ([place]) 09/09/2012  . Acute encephalopathy 09/07/2012  . Malignant neoplasm of upper lobe, bronchus or lung 02/19/2011  . Right bundle branch block with left anterior fascicular block 03/31/2009  . CHEST  PAIN-UNSPECIFIED 03/31/2009  . Insulin dependent diabetes mellitus ([place]) 07/18/2008  . HYPERLIPIDEMIA-MIXED 07/18/2008  . CAD, ARTERY BYPASS GRAFT 07/18/2008  . COPD (chronic obstructive pulmonary disease) ([place]) 07/18/2008    Orientation RESPIRATION BLADDER Height & Weight     Self, Situation  Normal Continent, External catheter Weight: 139 lb 15.9 oz (63.5 kg) Height:  5\' 11"  (180.3 cm)  BEHAVIORAL SYMPTOMS/MOOD NEUROLOGICAL BOWEL NUTRITION STATUS      Continent Diet (See DC Summary)  AMBULATORY STATUS COMMUNICATION OF NEEDS Skin   Limited Assist Verbally Surgical wounds (Right Hip Closed Incision with Mepilex dressing)                       Personal Care Assistance Level of Assistance  Dressing, Feeding, Bathing Bathing Assistance: Limited assistance Feeding assistance: Independent Dressing Assistance: Limited assistance     Functional Limitations Info             SPECIAL CARE FACTORS FREQUENCY  PT (By licensed PT), OT (By licensed OT)     PT Frequency: 3x week OT Frequency: 2x week            Contractures      Additional Factors Info  Code Status, Allergies, Insulin Sliding Scale Code Status Info: Full code Allergies Info: NUBAIN NALBUPHINE HCL    Insulin Sliding Scale Info: Insulin daily       Current Medications (10/04/2016):  This is the current hospital active medication list Current Facility-Administered Medications  Medication Dose Route Frequency Provider Last Rate Last Dose  . 0.9 % NaCl with KCl 20 mEq/ L  infusion   Intravenous Continuous Prudencio Burly III, PA-C 75 mL/hr at 10/04/16 1203    .  atorvastatin (LIPITOR) tablet 40 mg  40 mg Oral Daily Oswald Hillock, MD   40 mg at 10/04/16 9622  . baclofen (LIORESAL) tablet 10 mg  10 mg Oral TID PRN Prudencio Burly III, PA-C      . bisacodyl (DULCOLAX) suppository 10 mg  10 mg Rectal Daily PRN Prudencio Burly III, PA-C      . citalopram (CELEXA) tablet 10 mg  10 mg Oral  Daily Oswald Hillock, MD   10 mg at 10/04/16 2979  . diltiazem (CARDIZEM CD) 24 hr capsule 240 mg  240 mg Oral Daily Oswald Hillock, MD   240 mg at 10/04/16 0803  . docusate sodium (COLACE) capsule 100 mg  100 mg Oral BID Prudencio Burly III, PA-C   100 mg at 10/04/16 8921  . donepezil (ARICEPT) tablet 5 mg  5 mg Oral QHS Oswald Hillock, MD   5 mg at 10/03/16 2123  . enoxaparin (LOVENOX) injection 40 mg  40 mg Subcutaneous Q24H Prudencio Burly III, PA-C   40 mg at 10/04/16 1941  . fentaNYL (SUBLIMAZE) injection 25 mcg  25 mcg Intravenous Q4H PRN Oswald Hillock, MD   25 mcg at 10/03/16 1043  . HYDROcodone-acetaminophen (NORCO/VICODIN) 5-325 MG per tablet 1-2 tablet  1-2 tablet Oral Q6H PRN Oswald Hillock, MD   2 tablet at 10/04/16 7408  . insulin aspart (novoLOG) injection 0-9 Units  0-9 Units Subcutaneous TID WC Oswald Hillock, MD   2 Units at 10/03/16 1628  . ipratropium-albuterol (DUONEB) 0.5-2.5 (3) MG/3ML nebulizer solution 3 mL  3 mL Nebulization Q6H PRN Oswald Hillock, MD      . isosorbide mononitrate (IMDUR) 24 hr tablet 30 mg  30 mg Oral Daily Oswald Hillock, MD   30 mg at 10/04/16 0802  . menthol-cetylpyridinium (CEPACOL) lozenge 3 mg  1 lozenge Oral PRN Prudencio Burly III, PA-C       Or  . phenol (CHLORASEPTIC) mouth spray 1 spray  1 spray Mouth/Throat PRN Prudencio Burly III, PA-C      . mometasone-formoterol (DULERA) 200-5 MCG/ACT inhaler 2 puff  2 puff Inhalation BID Oswald Hillock, MD   2 puff at 10/02/16 2112  . oxybutynin (DITROPAN-XL) 24 hr tablet 10 mg  10 mg Oral QHS Oswald Hillock, MD   10 mg at 10/03/16 2124  . pantoprazole (PROTONIX) EC tablet 40 mg  40 mg Oral BID Oswald Hillock, MD   40 mg at 10/04/16 1448  . polyethylene glycol (MIRALAX / GLYCOLAX) packet 17 g  17 g Oral Daily PRN Prudencio Burly III, PA-C      . senna (SENOKOT) tablet 8.6 mg  1 tablet Oral BID PRN Oswald Hillock, MD      . senna [facility]) tablet 8.6 mg  1 tablet Oral BID  Prudencio Burly III, PA-C   8.6 mg at 10/04/16 1856  . tiotropium (SPIRIVA) inhalation capsule 18 mcg  18 mcg Inhalation Daily Darrick Meigs, Marge Duncans, MD      . Vitamin D (Ergocalciferol) (DRISDOL) capsule 50,000 Units  50,000 Units Oral Q7 days Oswald Hillock, MD       Facility-Administered Medications Ordered in Other Encounters  Medication Dose Route Frequency Provider Last Rate Last Dose  . albuterol (PROVENTIL) (5 MG/ML) 0.5% nebulizer solution 2.5 mg  2.5 mg Nebulization Once Lendon Colonel, NP         Discharge Medications: Please see discharge  summary for a list of discharge medications.  Relevant Imaging Results:  Relevant Lab Results:   Additional Information SS#:[id number], LCSW

## 2016-10-05 DIAGNOSIS — J449 Chronic obstructive pulmonary disease, unspecified: Secondary | ICD-10-CM

## 2016-10-05 LAB — GLUCOSE, CAPILLARY
GLUCOSE-CAPILLARY: 117 mg/dL — AB (ref 65–99)
GLUCOSE-CAPILLARY: 167 mg/dL — AB (ref 65–99)

## 2016-10-05 MED ORDER — ACETAMINOPHEN 500 MG PO TABS: 500.0000 mg | ORAL_TABLET | Freq: Three times a day (TID) | ORAL | 0 refills | Status: DC | PRN

## 2016-10-05 MED ORDER — ENSURE ENLIVE PO LIQD: 237.0000 mL | Freq: Two times a day (BID) | ORAL | 0 refills | Status: DC

## 2016-10-05 MED ORDER — POLYETHYLENE GLYCOL 3350 17 G PO PACK: 17.0000 g | PACK | Freq: Every day | ORAL | 0 refills | Status: DC | PRN

## 2016-10-05 NOTE — Discharge Summary (Signed)
Physician Discharge Summary  Thomas [name] JKK:[id number] DOB: February 05, 1927 DOA: 10/01/2016  PCP: Hilbert Corrigan, MD  Admit date: 10/01/2016 Discharge date: 10/05/2016  Admitted From: SNF Disposition:  SNF  Recommendations for Outpatient Follow-up:  1. Follow up with PCP in 1- week 2. Weight Bearing as Tolerated (WBAT) RLE 3. Dressing as needed  Home Health: NA Equipment/Devices: Rolling walker  Discharge Condition: Stable CODE STATUS: Full  Diet recommendation: Heart healthy and diabetic  Brief/Interim Summary: 83 year old male presented with right hip pain. Patient is known to have diabetes mellitus type 2, dyslipidemia, lung cancer, prostate cancer. He has a legal guardian from the [place]. Patient suffered a mechanical fall at the nursing facility about a week prior to hospitalization. Reported persistent pain and difficulty ambulating. On the physical examination blood pressure 146/65, heart rate 62, temperature 97.8, respiratory rate 18, oxygen saturation 99%. Moist mucous membranes, lungs were clear to auscultation bilaterally, heart S1-S2 present rhythmic, no gallops or murmurs,abdomen was soft nontender, no lower extremity edema Positive tenderness at right hip joint, decreased range of motion due to pain. Sodium 134, potassium 3.5, chloride 99, bicarbonate 26, glucose 118, BUN 24, creatinine 1.11, white count 8.6, hemoglobin 11.7, hematocrit 35.1, platelets 285, urine analysis negative for infection. EKG sinus rhythm with a right bundle branch block, chest x-ray with bibasilar atelectasis. Ultrasonography of the lower extremities negative for deep vein thrombosis. Hip x-rays with acute transcervical fracture of the right femoral neck.   Patient admitted with working diagnosis of right femoral neck fracture.  1. Right femoral neck fracture. Patient was admitted to the medical unit, he was placed on DVT prophylaxis, and analgesics.  Orthopedics was consulted, patient underwent hemiarthroplasty to the right hip with good toleration. He has remained stable, his pain is well controlled, he received physical therapy, with recommendations to return to skilled nursing facility to continue rehabilitation. Patient will continue hydrocodone-acetaminophen for pain control. Patient placed on aspirin for deep vein thrombosis prophylaxis.   2. Type 2 diabetes mellitus. Patient was placed on a diabetic diet, insulin sliding scale for glucose coverage and monitoring, capillary glucose 103, 116, 127, 149, 117 over last 24 hours. She tolerated will resume insulin glargine 12 units at bedtime at discharge.   3. Hypertension. Blood pressure remained well-controlled, he was continued on diltiazem and isosorbide with no major complications.   4. Dementia. Patient had episodes of confusion, no agitation or delirium, patient will continue donepezil and citalopram.   5. Dyslipidemia. Continue atorvastatin 40 mg  6. COPD. Chronic and stable, continue tiotropium and symbicort.  Discharge Diagnoses:  Principal Problem:   Closed displaced fracture of right femoral neck ([place]) Active Problems:   Type 2 diabetes mellitus with hyperglycemia, with long-term current use of insulin (HCC)   Femur fracture, right (HCC)    Discharge Instructions   Allergies as of 10/05/2016      Reactions   Nubain [nalbuphine Hcl]    Listed on MAR-no reaction is noted      Medication List    STOP taking these medications   amoxicillin-clavulanate 875-125 MG tablet Commonly known as:  AUGMENTIN     TAKE these medications   acetaminophen 500 MG tablet Commonly known as:  TYLENOL Take 1 tablet (500 mg total) by mouth every 8 (eight) hours as needed for moderate pain. What changed:  how much to take   aspirin EC 325 MG tablet Take 1 tablet (325 mg total) by mouth daily. For 30 days post  op for DVT Prophylaxis   atorvastatin 40 MG tablet Commonly known as:   LIPITOR Take 40 mg by mouth daily.   budesonide-formoterol 160-4.5 MCG/ACT inhaler Commonly known as:  SYMBICORT Inhale 2 puffs into the lungs 2 (two) times daily.   citalopram 10 MG tablet Commonly known as:  CELEXA Take 1 tablet (10 mg total) by mouth daily.   diltiazem 240 MG 24 hr capsule Commonly known as:  CARDIZEM CD TAKE ONE CAPSULE ORALLY EVERY MORNING.   donepezil 5 MG tablet Commonly known as:  ARICEPT Take 5 mg by mouth at bedtime.   feeding supplement (ENSURE ENLIVE) Liqd Take 237 mLs by mouth 2 (two) times daily with a meal.   ferrous sulfate 325 (65 FE) MG tablet Take 325 mg by mouth 2 (two) times daily with a meal.   HYDROcodone-acetaminophen 5-325 MG tablet Commonly known as:  NORCO Take 1-2 tablets by mouth every 6 (six) hours as needed for moderate pain.   insulin glargine 100 UNIT/ML injection Commonly known as:  LANTUS Inject 12 Units into the skin at bedtime.   ipratropium-albuterol 0.5-2.5 (3) MG/3ML Soln Commonly known as:  DUONEB Take 3 mLs by nebulization every 6 (six) hours as needed (dyspnea cough/wheezing).   isosorbide mononitrate 30 MG 24 hr tablet Commonly known as:  IMDUR TAKE ONE TABLET BY MOUTH ONCE DAILY.   oxybutynin 10 MG 24 hr tablet Commonly known as:  DITROPAN-XL Take 10 mg by mouth at bedtime.   pantoprazole 40 MG tablet Commonly known as:  PROTONIX Take 1 tablet (40 mg total) by mouth 2 (two) times daily.   polyethylene glycol packet Commonly known as:  MIRALAX / GLYCOLAX Take 17 g by mouth daily as needed for mild constipation.   PRESERVISION AREDS Caps Take 1 capsule by mouth 2 (two) times daily.   senna 8.6 MG Tabs tablet Commonly known as:  SENOKOT Take 1 tablet (8.6 mg total) by mouth 2 (two) times daily as needed for mild constipation.   tiotropium 18 MCG inhalation capsule Commonly known as:  SPIRIVA Place 18 mcg into inhaler and inhale daily.   Vitamin D (Ergocalciferol) 50000 units Caps  capsule Commonly known as:  DRISDOL Take 50,000 Units by mouth every 7 (seven) days.            Discharge Care Instructions        Start     Ordered   10/05/16 0000  acetaminophen (TYLENOL) 500 MG tablet  Every 8 hours PRN     10/05/16 1001   10/05/16 0000  feeding supplement, ENSURE ENLIVE, (ENSURE ENLIVE) LIQD  2 times daily with meals     10/05/16 1001   10/05/16 0000  polyethylene glycol (MIRALAX / GLYCOLAX) packet  Daily PRN     10/05/16 1001   10/05/16 0000  Increase activity slowly     10/05/16 1001   10/05/16 0000  Diet - low sodium heart healthy     10/05/16 1001   10/05/16 0000  Discharge instructions    Comments:  Please follow with primary care in 7 days.   10/05/16 1001   10/03/16 0000  HYDROcodone-acetaminophen (NORCO) 5-325 MG tablet  Every 6 hours PRN     10/03/16 0946   10/03/16 0000  aspirin EC 325 MG tablet  Daily     10/03/16 0946     Follow-up Information    Renette Butters, MD Follow up in 2 week(s).   Specialty:  Orthopedic Surgery Contact information: [address] [place] [address] [phone]          Allergies  Allergen Reactions  . Nubain [Nalbuphine Hcl]     Listed on MAR-no reaction is noted    Consultations:  Orthopedics   Procedures/Studies: Dg Chest 1 View  Result Date: 10/01/2016 CLINICAL DATA:  Preop right hip fracture.  Patient fell last week. EXAM: CHEST 1 VIEW COMPARISON:  07/10/2016 CXR FINDINGS: Status post CABG with aortic atherosclerosis. Borderline cardiomegaly is noted. The patient is status post kyphoplasty at the thoracolumbar junction. Minimal atelectasis and/or scarring is seen at the right lung base. There may be a tiny right effusion blunting the right costophrenic angle. Emphysematous hyperinflation of the lungs with chronic scarring in the right upper lobe. No pneumonic consolidation. IMPRESSION: 1. Postop appearance of the right lung with scarring. 2. Minimal blunting the right  costophrenic angle may reflect a small effusion pleural thickening. 3. Status post CABG. Electronically Signed   By: Ashley Royalty M.D.   On: 10/01/2016 22:25   US Venous Img Lower Bilateral  Result Date: 10/02/2016 CLINICAL DATA:  Right hip fracture after fall 1 week ago. EXAM: BILATERAL LOWER EXTREMITY VENOUS DOPPLER ULTRASOUND TECHNIQUE: Gray-scale sonography with graded compression, as well as color Doppler and duplex ultrasound were performed to evaluate the lower extremity deep venous systems from the level of the common femoral vein and including the common femoral, femoral, profunda femoral, popliteal and calf veins including the posterior tibial, peroneal and gastrocnemius veins when visible. The superficial great saphenous vein was also interrogated. Spectral Doppler was utilized to evaluate flow at rest and with distal augmentation maneuvers in the common femoral, femoral and popliteal veins. COMPARISON:  None. FINDINGS: RIGHT LOWER EXTREMITY Common Femoral Vein: No evidence of thrombus. Normal compressibility, respiratory phasicity and response to augmentation. Saphenofemoral Junction: No evidence of thrombus. Normal compressibility and flow on color Doppler imaging. Profunda Femoral Vein: No evidence of thrombus. Normal compressibility and flow on color Doppler imaging. Femoral Vein: No evidence of thrombus. Normal compressibility, respiratory phasicity and response to augmentation. Popliteal Vein: No evidence of thrombus. Normal compressibility, respiratory phasicity and response to augmentation. Calf Veins: No evidence of thrombus. Normal compressibility and flow on color Doppler imaging. Superficial Great Saphenous Vein: No evidence of thrombus. Normal compressibility and flow on color Doppler imaging. Venous Reflux:  None. Other Findings:  None. LEFT LOWER EXTREMITY Common Femoral Vein: No evidence of thrombus. Normal compressibility, respiratory phasicity and response to augmentation.  Saphenofemoral Junction: No evidence of thrombus. Normal compressibility and flow on color Doppler imaging. Profunda Femoral Vein: No evidence of thrombus. Normal compressibility and flow on color Doppler imaging. Femoral Vein: No evidence of thrombus. Normal compressibility, respiratory phasicity and response to augmentation. Popliteal Vein: No evidence of thrombus. Normal compressibility, respiratory phasicity and response to augmentation. Calf Veins: No evidence of thrombus. Normal compressibility and flow on color Doppler imaging. Superficial Great Saphenous Vein: No evidence of thrombus. Normal compressibility and flow on color Doppler imaging. Venous Reflux:  None. Other Findings:  None. IMPRESSION: No evidence of DVT within either lower extremity. Electronically Signed   By: Marijo Conception, M.D.   On: 10/02/2016 14:33   Dg Knee Complete 4 Views Right  Result Date: 10/01/2016 CLINICAL DATA:  Right hip pain after fall last week. EXAM: RIGHT KNEE - COMPLETE 4+ VIEW COMPARISON:  None. FINDINGS: Osteoarthritic joint space narrowing of the patellofemoral and femorotibial compartments without joint effusion, fracture or malalignment. Vascular clips are seen about the medial aspect  of right leg. IMPRESSION: Osteoarthritis of the right knee without acute osseous appearing abnormality. Electronically Signed   By: Ashley Royalty M.D.   On: 10/01/2016 22:26   Dg Hip Unilat With Pelvis 1v Right  Result Date: 10/03/2016 CLINICAL DATA:  83 year old male status past right hip arthroplasty. EXAM: DG HIP (WITH OR WITHOUT PELVIS) 1V RIGHT COMPARISON:  10/01/2016 FINDINGS: Patient is status post interval right total hip arthroplasty. The hardware appears intact and in anatomic alignment. Femoral nail fixation on the left appear stable. Multiple surgical clips are noted overlying the pelvis. Remaining osseous and soft tissue structures grossly unremarkable. IMPRESSION: Interval right hip total arthroplasty. The hardware  appears intact and in anatomic alignment. Electronically Signed   By: Kristopher Oppenheim M.D.   On: 10/03/2016 14:47   Dg Hip Port Unilat With Pelvis 1v Right  Result Date: 10/04/2016 CLINICAL DATA:  Right hip pain after fall. EXAM: DG HIP (WITH OR WITHOUT PELVIS) 1V PORT RIGHT COMPARISON:  Yesterday. FINDINGS: Postsurgical changes related to right hip hemiarthroplasty and left femur ORIF. No evidence of periprosthetic fracture or lucency. No dislocation. Osteopenia. Postoperative subcutaneous and intra-articular emphysema about the right hip, unchanged. IMPRESSION: Postsurgical changes related to right hip hemiarthroplasty without evidence of hardware complication. No acute fracture or malalignment. Electronically Signed   By: Titus Dubin M.D.   On: 10/04/2016 15:54   Dg Hip Unilat W Or Wo Pelvis 2-3 Views Right  Result Date: 10/01/2016 CLINICAL DATA:  Right hip pain after fall last week. EXAM: DG HIP (WITH OR WITHOUT PELVIS) 2-3V RIGHT COMPARISON:  07/10/2016 CT scout radiograph FINDINGS: An acute, closed, varus angulated transcervical fracture of the right femur is noted. No hip dislocation. Partially included left femoral nail fixation of the left femur is intact. No acute osseous abnormality of the bony pelvis. Status post radical prostatectomy with numerous surgical clips in lower pelvis bilaterally. There is lower lumbar spondylosis. Penile prosthesis is noted. IMPRESSION: Acute, varus angulated transcervical fracture of the right femoral neck. Electronically Signed   By: Ashley Royalty M.D.   On: 10/01/2016 22:23       Subjective: Patient feeling well, no leg pain, no chest pain or dyspnea, no nausea or vomiting.   Discharge Exam: Vitals:   10/04/16 2321 10/05/16 0640  BP: 116/81 126/73  Pulse: 81 84  Resp:    Temp:  98.9 F (37.2 C)  SpO2: 96% 97%   Vitals:   10/04/16 0430 10/04/16 1930 10/04/16 2321 10/05/16 0640  BP: (!) 102/42  116/81 126/73  Pulse: 72  81 84  Resp:       Temp: 98.1 F (36.7 C)   98.9 F (37.2 C)  TempSrc: Oral   Axillary  SpO2: 97% 97% 96% 97%  Weight:      Height:        General: Pt is alert, awake, not in acute distress E ENT: no pallor or icterus, oral mucosa moist.  Cardiovascular: RRR, S1/S2 +, no rubs, no gallops Respiratory: CTA bilaterally, no wheezing, no rhonchi Abdominal: Soft, NT, ND, bowel sounds + Extremities: no edema, no cyanosis    The results of significant diagnostics from this hospitalization (including imaging, microbiology, ancillary and laboratory) are listed below for reference.     Microbiology: Recent Results (from the past 240 hour(s))  MRSA PCR Screening     Status: None   Collection Time: 10/02/16  9:53 PM  Result Value Ref Range Status   MRSA by PCR NEGATIVE NEGATIVE Final  Comment:        The GeneXpert MRSA Assay (FDA approved for NASAL specimens only), is one component of a comprehensive MRSA colonization surveillance program. It is not intended to diagnose MRSA infection nor to guide or monitor treatment for MRSA infections.      Labs: BNP (last 3 results) No results for input(s): BNP in the last 8760 hours. Basic Metabolic Panel:  Recent Labs Lab 10/01/16 2200 10/03/16 1131 10/04/16 0353  NA 134*  --  140  K 3.5  --  4.2  CL 99*  --  107  CO2 26  --  27  GLUCOSE 118*  --  131*  BUN 24*  --  23*  CREATININE 1.11 0.68 1.07  CALCIUM 9.1  --  9.2   Liver Function Tests:  Recent Labs Lab 10/01/16 2200  AST 17  ALT 15*  ALKPHOS 59  BILITOT 0.5  PROT 6.3*  ALBUMIN 3.2*   No results for input(s): LIPASE, AMYLASE in the last 168 hours. No results for input(s): AMMONIA in the last 168 hours. CBC:  Recent Labs Lab 10/01/16 2200 10/03/16 1131 10/04/16 0353  WBC 8.6 10.9* 9.7  NEUTROABS 6.0  --   --   HGB 11.7* 11.9* 11.2*  HCT 35.1* 37.5* 34.5*  MCV 87.8 88.7 88.2  PLT 285 325 312   Cardiac Enzymes: No results for input(s): CKTOTAL, CKMB, CKMBINDEX,  TROPONINI in the last 168 hours. BNP: Invalid input(s): POCBNP CBG:  Recent Labs Lab 10/04/16 0550 10/04/16 1113 10/04/16 1717 10/04/16 2319 10/05/16 0722  GLUCAP 103* 116* 127* 149* 117*   D-Dimer No results for input(s): DDIMER in the last 72 hours. Hgb A1c No results for input(s): HGBA1C in the last 72 hours. Lipid Profile No results for input(s): CHOL, HDL, LDLCALC, TRIG, CHOLHDL, LDLDIRECT in the last 72 hours. Thyroid function studies No results for input(s): TSH, T4TOTAL, T3FREE, THYROIDAB in the last 72 hours.  Invalid input(s): FREET3 Anemia work up No results for input(s): VITAMINB12, FOLATE, FERRITIN, TIBC, IRON, RETICCTPCT in the last 72 hours. Urinalysis    Component Value Date/Time   COLORURINE YELLOW 10/01/2016 2326   APPEARANCEUR CLEAR 10/01/2016 2326   LABSPEC 1.015 10/01/2016 2326   PHURINE 5.0 10/01/2016 2326   GLUCOSEU NEGATIVE 10/01/2016 2326   HGBUR NEGATIVE 10/01/2016 2326   BILIRUBINUR NEGATIVE 10/01/2016 2326   KETONESUR NEGATIVE 10/01/2016 2326   PROTEINUR NEGATIVE 10/01/2016 2326   UROBILINOGEN 0.2 11/13/2012 1238   NITRITE NEGATIVE 10/01/2016 2326   LEUKOCYTESUR NEGATIVE 10/01/2016 2326   Sepsis Labs Invalid input(s): PROCALCITONIN,  WBC,  LACTICIDVEN Microbiology Recent Results (from the past 240 hour(s))  MRSA PCR Screening     Status: None   Collection Time: 10/02/16  9:53 PM  Result Value Ref Range Status   MRSA by PCR NEGATIVE NEGATIVE Final    Comment:        The GeneXpert MRSA Assay (FDA approved for NASAL specimens only), is one component of a comprehensive MRSA colonization surveillance program. It is not intended to diagnose MRSA infection nor to guide or monitor treatment for MRSA infections.      Time coordinating discharge: 45 minutes  SIGNED:   Tawni Millers, MD  [facility] 10/05/2016, 9:45 AM Pager [phone]  If 7PM-7AM, please contact night-coverage [email or web address] Password [email or web address]

## 2016-10-05 NOTE — Progress Notes (Signed)
Pt ready for return to SNF per MD. Report called to Alphonsus Sias at [facility]. Pt was transferred to facility via [facility]. Pt's clothing, wallet, and tennis shoes sent with pt. He did not have dentures with him.  [place], Jerry Caras

## 2016-10-05 NOTE — Clinical Social Work Note (Signed)
Clinical Social Worker facilitated patient discharge including contacting patient family and facility to confirm patient discharge plans.  Clinical information faxed to facility and family agreeable with plan.  CSW arranged ambulance transport via [facility] to [facility].  RN to call report prior to discharge.  Clinical Social Worker will sign off for now as social work intervention is no longer needed. Please consult [place] again if new need arises.  Barbette Or, [place]

## 2016-10-05 NOTE — Progress Notes (Signed)
Patient ID: Thomas [name], male   DOB: 12-14-1927, 83 y.o.   MRN: [id number]     Subjective:  Patient reports pain as mild.  Patient with dementia and very pleasant follows commands and in no acute distress  Objective:   VITALS:   Vitals:   10/04/16 0430 10/04/16 1930 10/04/16 2321 10/05/16 0640  BP: (!) 102/42  116/81 126/73  Pulse: 72  81 84  Resp:      Temp: 98.1 F (36.7 C)   98.9 F (37.2 C)  TempSrc: Oral   Axillary  SpO2: 97% 97% 96% 97%  Weight:      Height:        ABD soft Sensation intact distally Dorsiflexion/Plantar flexion intact Incision: dressing C/D/I and no drainage   Lab Results  Component Value Date   WBC 9.7 10/04/2016   HGB 11.2 (L) 10/04/2016   HCT 34.5 (L) 10/04/2016   MCV 88.2 10/04/2016   PLT 312 10/04/2016   BMET    Component Value Date/Time   NA 140 10/04/2016 0353   K 4.2 10/04/2016 0353   CL 107 10/04/2016 0353   CO2 27 10/04/2016 0353   GLUCOSE 131 (H) 10/04/2016 0353   BUN 23 (H) 10/04/2016 0353   CREATININE 1.07 10/04/2016 0353   CALCIUM 9.2 10/04/2016 0353   GFRNONAA >60 10/04/2016 0353   GFRAA >60 10/04/2016 0353     Assessment/Plan: 2 Days Post-Op   Principal Problem:   Closed displaced fracture of right femoral neck (HCC) Active Problems:   Type 2 diabetes mellitus with hyperglycemia, with long-term current use of insulin (HCC)   Femur fracture, right ([facility]CC)   Advance diet Up with therapy Continue plan per medicine Planning SNF  Okay for SNF when cleared with medicine  WBAT right lower ext Follow up with Dr Ranelle Oyster, [place] 10/05/2016, 9:45 AM  Discussed and agree with above.  Plan SNF today.    Marchia Bond, MD Cell [phone]

## 2016-10-06 LAB — BPAM RBC
BLOOD PRODUCT EXPIRATION DATE: 201809122359
BLOOD PRODUCT EXPIRATION DATE: 201809182359
UNIT TYPE AND RH: 1700
UNIT TYPE AND RH: 1700

## 2016-10-06 LAB — TYPE AND SCREEN
ABO/RH(D): B POS
Antibody Screen: NEGATIVE
UNIT DIVISION: 0
Unit division: 0

## 2016-10-09 ENCOUNTER — Encounter (HOSPITAL_COMMUNITY): Payer: Self-pay | Admitting: Orthopedic Surgery

## 2017-02-03 ENCOUNTER — Encounter (HOSPITAL_COMMUNITY): Payer: Self-pay | Admitting: Emergency Medicine

## 2017-02-03 ENCOUNTER — Emergency Department (HOSPITAL_COMMUNITY)
Admission: EM | Admit: 2017-02-03 | Discharge: 2017-02-03 | Disposition: A | Discharge status: Home / Self Care | Payer: Medicare Other | Attending: Emergency Medicine | Admitting: Emergency Medicine

## 2017-02-03 ENCOUNTER — Other Ambulatory Visit: Payer: Self-pay

## 2017-02-03 ENCOUNTER — Emergency Department (HOSPITAL_COMMUNITY): Payer: Medicare Other

## 2017-02-03 DIAGNOSIS — Y92129 Unspecified place in nursing home as the place of occurrence of the external cause: Secondary | ICD-10-CM | POA: Diagnosis not present

## 2017-02-03 DIAGNOSIS — W25XXXA Contact with sharp glass, initial encounter: Secondary | ICD-10-CM | POA: Diagnosis not present

## 2017-02-03 DIAGNOSIS — Z23 Encounter for immunization: Secondary | ICD-10-CM | POA: Diagnosis not present

## 2017-02-03 DIAGNOSIS — Y9389 Activity, other specified: Secondary | ICD-10-CM | POA: Diagnosis not present

## 2017-02-03 DIAGNOSIS — Z79899 Other long term (current) drug therapy: Secondary | ICD-10-CM | POA: Insufficient documentation

## 2017-02-03 DIAGNOSIS — Y998 Other external cause status: Secondary | ICD-10-CM | POA: Diagnosis not present

## 2017-02-03 DIAGNOSIS — Z951 Presence of aortocoronary bypass graft: Secondary | ICD-10-CM | POA: Insufficient documentation

## 2017-02-03 DIAGNOSIS — Z96641 Presence of right artificial hip joint: Secondary | ICD-10-CM | POA: Insufficient documentation

## 2017-02-03 DIAGNOSIS — S61216A Laceration without foreign body of right little finger without damage to nail, initial encounter: Secondary | ICD-10-CM | POA: Insufficient documentation

## 2017-02-03 DIAGNOSIS — S61411A Laceration without foreign body of right hand, initial encounter: Secondary | ICD-10-CM

## 2017-02-03 DIAGNOSIS — I2510 Atherosclerotic heart disease of native coronary artery without angina pectoris: Secondary | ICD-10-CM | POA: Diagnosis not present

## 2017-02-03 DIAGNOSIS — Z8546 Personal history of malignant neoplasm of prostate: Secondary | ICD-10-CM | POA: Insufficient documentation

## 2017-02-03 DIAGNOSIS — I252 Old myocardial infarction: Secondary | ICD-10-CM | POA: Diagnosis not present

## 2017-02-03 DIAGNOSIS — I10 Essential (primary) hypertension: Secondary | ICD-10-CM | POA: Insufficient documentation

## 2017-02-03 DIAGNOSIS — E119 Type 2 diabetes mellitus without complications: Secondary | ICD-10-CM | POA: Insufficient documentation

## 2017-02-03 DIAGNOSIS — Z87891 Personal history of nicotine dependence: Secondary | ICD-10-CM | POA: Insufficient documentation

## 2017-02-03 DIAGNOSIS — J449 Chronic obstructive pulmonary disease, unspecified: Secondary | ICD-10-CM | POA: Insufficient documentation

## 2017-02-03 DIAGNOSIS — Z794 Long term (current) use of insulin: Secondary | ICD-10-CM | POA: Insufficient documentation

## 2017-02-03 MED ORDER — LIDOCAINE HCL (PF) 2 % IJ SOLN
INTRAMUSCULAR | Status: AC
  Administered 2017-02-03: 19:00:00
  Filled 2017-02-03: qty 10

## 2017-02-03 MED ORDER — TETANUS-DIPHTH-ACELL PERTUSSIS 5-2.5-18.5 LF-MCG/0.5 IM SUSP
0.5000 mL | Freq: Once | INTRAMUSCULAR | Status: AC
  Administered 2017-02-03: 0.5 mL via INTRAMUSCULAR
  Filled 2017-02-03: qty 0.5

## 2017-02-03 MED ORDER — POVIDONE-IODINE 10 % EX SOLN
CUTANEOUS | Status: AC
  Administered 2017-02-03: 1
  Filled 2017-02-03: qty 15

## 2017-02-03 NOTE — ED Provider Notes (Signed)
[facility] EMERGENCY DEPARTMENT Provider Note   CSN: [id number] Arrival date & time: 02/03/17  1549     History   Chief Complaint Chief Complaint  Patient presents with  . Laceration    HPI Thomas [name] is a 83 y.o. male.  HPI  The patient is an 83 year old male, he presents to the hospital today with a complaint of a laceration at the base of his fifth finger of the right hand, this was acute in onset and he has states that it occurred when he was at the sink and had some broken glass that cut him.  He denies any other injuries complaints.  This was acute in onset, occurred just prior to arrival, the symptoms are persistent mild and worse with palpation.  Bleeding controlled.  Past Medical History:  Diagnosis Date  . Anginal pain ([place])   . Cancer ([place])   . Diabetes mellitus   . Hyperlipemia   . Lung cancer ([place])   . Prostate cancer [facility])     Patient Active Problem List   Diagnosis Date Noted  . Femur fracture, right ([place]) 10/02/2016  . Closed displaced fracture of right femoral neck ([place]) 10/02/2016  . Anemia 07/13/2016  . UGIB (upper gastrointestinal bleed)   . Hematemesis 07/11/2016  . Hemoptysis 07/10/2016  . HCAP (healthcare-associated pneumonia) 07/10/2016  . Protein-calorie malnutrition, severe 05/05/2016  . Nondisplaced intertrochanteric fracture of left femur, initial encounter for closed fracture ([place]) 05/03/2016  . Type 2 diabetes mellitus with hyperglycemia, with long-term current use of insulin ([place]) 05/03/2016  . Type 1 diabetes mellitus with hyperlipidemia ([place]) 05/03/2016  . Essential hypertension 05/03/2016  . Altered mental status 07/06/2015  . Normocytic anemia 07/06/2015  . Hypotension 07/06/2015  . Anxiety 07/06/2015  . Arterial hypotension   . Abnormal laboratory test result 11/01/2012  . Epigastric pain 09/30/2012  . NSTEMI (non-ST elevated myocardial infarction) ([place]) 09/30/2012  . Acute renal failure ([place]) 09/09/2012  . Acute  encephalopathy 09/07/2012  . Malignant neoplasm of upper lobe, bronchus or lung 02/19/2011  . Right bundle branch block with left anterior fascicular block 03/31/2009  . CHEST PAIN-UNSPECIFIED 03/31/2009  . Insulin dependent diabetes mellitus ([place]) 07/18/2008  . HYPERLIPIDEMIA-MIXED 07/18/2008  . CAD, ARTERY BYPASS GRAFT 07/18/2008  . COPD (chronic obstructive pulmonary disease) ([place]) 07/18/2008    Past Surgical History:  Procedure Laterality Date  . BACK SURGERY    . CARDIAC SURGERY    . ESOPHAGOGASTRODUODENOSCOPY N/A 07/11/2016   Procedure: ESOPHAGOGASTRODUODENOSCOPY (EGD);  Surgeon: Rogene Houston, MD;  [facility];  Service: Endoscopy;  Laterality: N/A;  . HIP ARTHROPLASTY Right 10/03/2016   Procedure: ARTHROPLASTY BIPOLAR HIP (HEMIARTHROPLASTY) with cabeling;  Surgeon: Renette Butters, MD;  Location: [place];  Service: Orthopedics;  Laterality: Right;  . INTRAMEDULLARY (IM) NAIL INTERTROCHANTERIC Left 05/04/2016   Procedure: INTRAMEDULLARY (IM) NAIL INTERTROCHANTRIC;  Surgeon: Renette Butters, MD;  Location: [place];  Service: Orthopedics;  Laterality: Left;  . PROSTATE SURGERY    . surgery to repair aneurysm    . TUMOR REMOVAL     from lung       Home Medications    Prior to Admission medications   Medication Sig Start Date End Date Taking? Authorizing Provider  acetaminophen (TYLENOL) 500 MG tablet Take 1 tablet (500 mg total) by mouth every 8 (eight) hours as needed for moderate pain. 10/05/16   Arrien, Jimmy Picket, MD  aspirin EC 325 MG tablet Take 1 tablet (325 mg total) by mouth  daily. For 30 days post op for DVT Prophylaxis 10/03/16   Prudencio Burly III, PA-C  atorvastatin (LIPITOR) 40 MG tablet Take 40 mg by mouth daily.    [provider]  budesonide-formoterol (SYMBICORT) 160-4.5 MCG/ACT inhaler Inhale 2 puffs into the lungs 2 (two) times daily.    [provider]  citalopram (CELEXA) 10 MG tablet Take 1 tablet (10 mg total) by  mouth daily. 07/10/15   Isaac Bliss, Rayford Halsted, MD  diltiazem (CARDIZEM CD) 240 MG 24 hr capsule TAKE ONE CAPSULE ORALLY EVERY MORNING. 03/01/15   Lendon Colonel, NP  donepezil (ARICEPT) 5 MG tablet Take 5 mg by mouth at bedtime.    [provider]  feeding supplement, ENSURE ENLIVE, (ENSURE ENLIVE) LIQD Take 237 mLs by mouth 2 (two) times daily with a meal. 10/05/16   Arrien, Jimmy Picket, MD  ferrous sulfate 325 (65 FE) MG tablet Take 325 mg by mouth 2 (two) times daily with a meal.     [provider]  HYDROcodone-acetaminophen (NORCO) 5-325 MG tablet Take 1-2 tablets by mouth every 6 (six) hours as needed for moderate pain. 10/03/16   Prudencio Burly III, PA-C  insulin glargine (LANTUS) 100 UNIT/ML injection Inject 12 Units into the skin at bedtime.    [provider]  ipratropium-albuterol (DUONEB) 0.5-2.5 (3) MG/3ML SOLN Take 3 mLs by nebulization every 6 (six) hours as needed (dyspnea cough/wheezing).    [provider]  isosorbide mononitrate (IMDUR) 30 MG 24 hr tablet TAKE ONE TABLET BY MOUTH ONCE DAILY. 02/17/15   Lendon Colonel, NP  Multiple Vitamins-Minerals (PRESERVISION AREDS) CAPS Take 1 capsule by mouth 2 (two) times daily.    [provider]  oxybutynin (DITROPAN-XL) 10 MG 24 hr tablet Take 10 mg by mouth at bedtime.    [provider]  pantoprazole (PROTONIX) 40 MG tablet Take 1 tablet (40 mg total) by mouth 2 (two) times daily. 07/13/16   Orvan Falconer, MD  polyethylene glycol [facility] Floria Raveling) packet Take 17 g by mouth daily as needed for mild constipation. 10/05/16   Arrien, Jimmy Picket, MD  senna (SENOKOT) 8.6 MG TABS tablet Take 1 tablet (8.6 mg total) by mouth 2 (two) times daily as needed for mild constipation. 05/06/16   Patrecia Pour, MD  tiotropium (SPIRIVA) 18 MCG inhalation capsule Place 18 mcg into inhaler and inhale daily.    [provider]  Vitamin D, Ergocalciferol, (DRISDOL) 50000 units  CAPS capsule Take 50,000 Units by mouth every 7 (seven) days.    [provider]    Family History History reviewed. No pertinent family history.  Social History Social History   Tobacco Use  . Smoking status: Former Smoker    Packs/day: 1.00    Years: 50.00    Pack years: 50.00    Types: Cigarettes    Last attempt to quit: 12/10/1993    Years since quitting: 23.1  . Smokeless tobacco: Former Systems developer    Types: Chew  Substance Use Topics  . Alcohol use: No    Alcohol/week: 0.0 oz  . Drug use: No     Allergies   Nubain [nalbuphine hcl]   Review of Systems Review of Systems  Constitutional: Negative for fever.  Gastrointestinal: Negative for vomiting.  Skin: Positive for wound.       Laceration  Neurological: Negative for weakness and numbness.     Physical Exam Updated Vital Signs BP (!) 109/53 (BP Location: Left Arm)  Pulse 73   Temp 98.3 F (36.8 C) (Oral)   Resp 18   Ht 5\' 11"  (1.803 m)   Wt 70.3 kg (155 lb)   SpO2 96%   BMI 21.62 kg/m   Physical Exam  Constitutional: He appears well-developed and well-nourished. No distress.  HENT:  Head: Normocephalic.  Eyes: Conjunctivae are normal. No scleral icterus.  Cardiovascular: Normal rate and regular rhythm.  Pulmonary/Chest: Effort normal and breath sounds normal.  Musculoskeletal: Normal range of motion. He exhibits tenderness ( ttp over the laceration site ). He exhibits no edema.  Normal flexor tender function, normal ROM of all fingers against resistance, no active bleeding  Neurological: He is alert. Coordination normal.  Sensation and motor intact  Skin: Skin is warm and dry. He is not diaphoretic.  Laceration located on R hand at the palmar surface at the MCP The Laceration is Linear shaped The depth is 47mm The length is 1.5 cm     ED Treatments / Results  Labs (all labs ordered are listed, but only abnormal results are displayed) Labs Reviewed - No data to  display   Radiology No results found.  Procedures .Marland KitchenLaceration Repair Date/Time: 02/03/2017 5:22 PM Performed by: Noemi Chapel, MD Authorized by: Noemi Chapel, MD   Consent:    Consent obtained:  Verbal   Consent given by:  Patient   Risks discussed:  Pain, infection, need for additional repair, vascular damage, tendon damage and poor cosmetic result   Alternatives discussed:  No treatment and delayed treatment Anesthesia (see MAR for exact dosages):    Anesthesia method:  Local infiltration   Local anesthetic:  Lidocaine 1% w/o epi Laceration details:    Location:  Hand   Hand location:  R palm   Length (cm):  1.5   Depth (mm):  2 Repair type:    Repair type:  Simple Pre-procedure details:    Preparation:  Patient was prepped and draped in usual sterile fashion Exploration:    Hemostasis achieved with:  Direct pressure   Wound exploration: wound explored through full range of motion and entire depth of wound probed and visualized     Wound extent: no foreign bodies/material noted, no muscle damage noted, no nerve damage noted, no tendon damage noted, no underlying fracture noted and no vascular damage noted     Contaminated: no   Treatment:    Area cleansed with:  Betadine   Amount of cleaning:  Standard   Irrigation solution:  Sterile saline   Irrigation volume:  100   Irrigation method:  Syringe Skin repair:    Repair method:  Sutures   Suture size:  5-0   Suture material:  Prolene   Suture technique:  Simple interrupted   Number of sutures:  3 Approximation:    Approximation:  Close   Vermilion border: well-aligned   Post-procedure details:    Dressing:  Antibiotic ointment and sterile dressing   Patient tolerance of procedure:  Tolerated well, no immediate complications   (including critical care time)  Medications Ordered in ED Medications  lidocaine (XYLOCAINE) 2 % injection (not administered)  povidone-iodine (BETADINE) 10 % external solution (not  administered)  Tdap (BOOSTRIX) injection 0.5 mL (not administered)     Initial Impression / Assessment and Plan / ED Course  I have reviewed the triage vital signs and the nursing notes.  Pertinent labs & imaging results that were available during my care of the patient were reviewed by me and considered in my medical  decision making (see chart for details).    X-ray obtained to rule out foreign body, wound sutured successfully, 3 sutures, close approximation, no signs of deep structure damage, patient otherwise stable, updated tetanus.  Laceration repaired, x-ray negative for foreign body as interpreted by myself.  Stable For discharge  Final Clinical Impressions(s) / ED Diagnoses   Final diagnoses:  Laceration of right hand without foreign body, initial encounter    ED Discharge Orders    None       Noemi Chapel, MD 02/03/17 1755

## 2017-02-03 NOTE — Discharge Instructions (Signed)
Mr. Thomas [name] should keep his right hand immobilized with a bulky dressing to prevent him from using it.  If the stitches should come out between 7 and 10 days, return for increasing pain fever bleeding pus or swelling

## 2017-02-03 NOTE — ED Notes (Signed)
Pt up standing at door. Pt ready to leave. Pt told we have called for EMS to carry him back to [facility]. Pt helped back into room, given crackers & drink.

## 2017-02-03 NOTE — ED Triage Notes (Signed)
Pt with dementia  Grabbed a glass and it cut his Right palmar surface at his 4-5th finger area  EMS cannot state how deep or length of cut  FROM

## 2017-02-03 NOTE — ED Notes (Signed)
Pt carried back to [facility] by EMS.

## 2017-02-03 NOTE — ED Notes (Signed)
Call to Rad to come and portable for pt

## 2018-07-13 ENCOUNTER — Ambulatory Visit: Payer: Self-pay | Admitting: Cardiovascular Disease

## 2018-09-04 ENCOUNTER — Ambulatory Visit: Payer: Medicare Other | Admitting: Cardiovascular Disease

## 2018-10-15 ENCOUNTER — Ambulatory Visit: Payer: Medicare Other | Admitting: Cardiovascular Disease

## 2018-12-27 IMAGING — CR DG ABD PORTABLE 2V
1 series · 2 of 2 positions shown · non-contrast
Comparison: CT abdomen and pelvis January 21, 2013

CLINICAL DATA: Hematemesis

EXAM:
PORTABLE ABDOMEN - 2 VIEW

[Series 1: supine ap · 0.17mm/px · 2 of 2 slices shown]
[im 1/2]
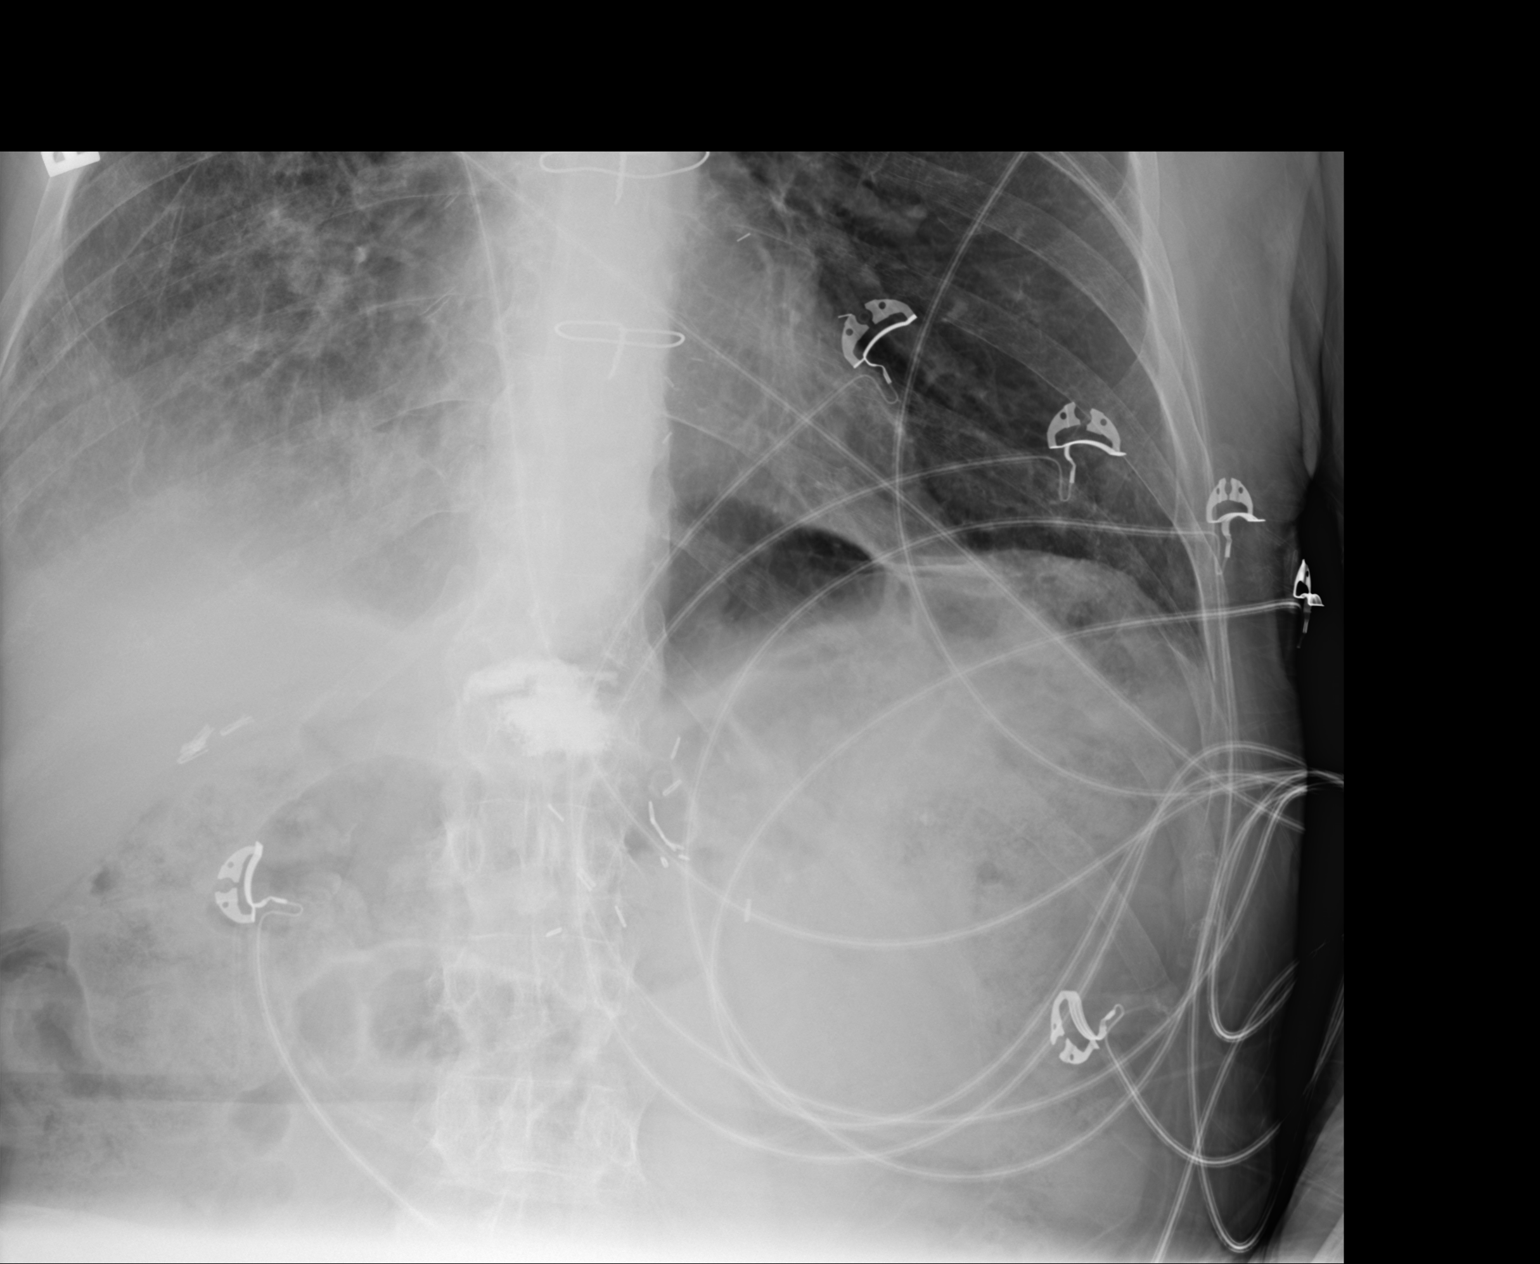
[im 2/2]
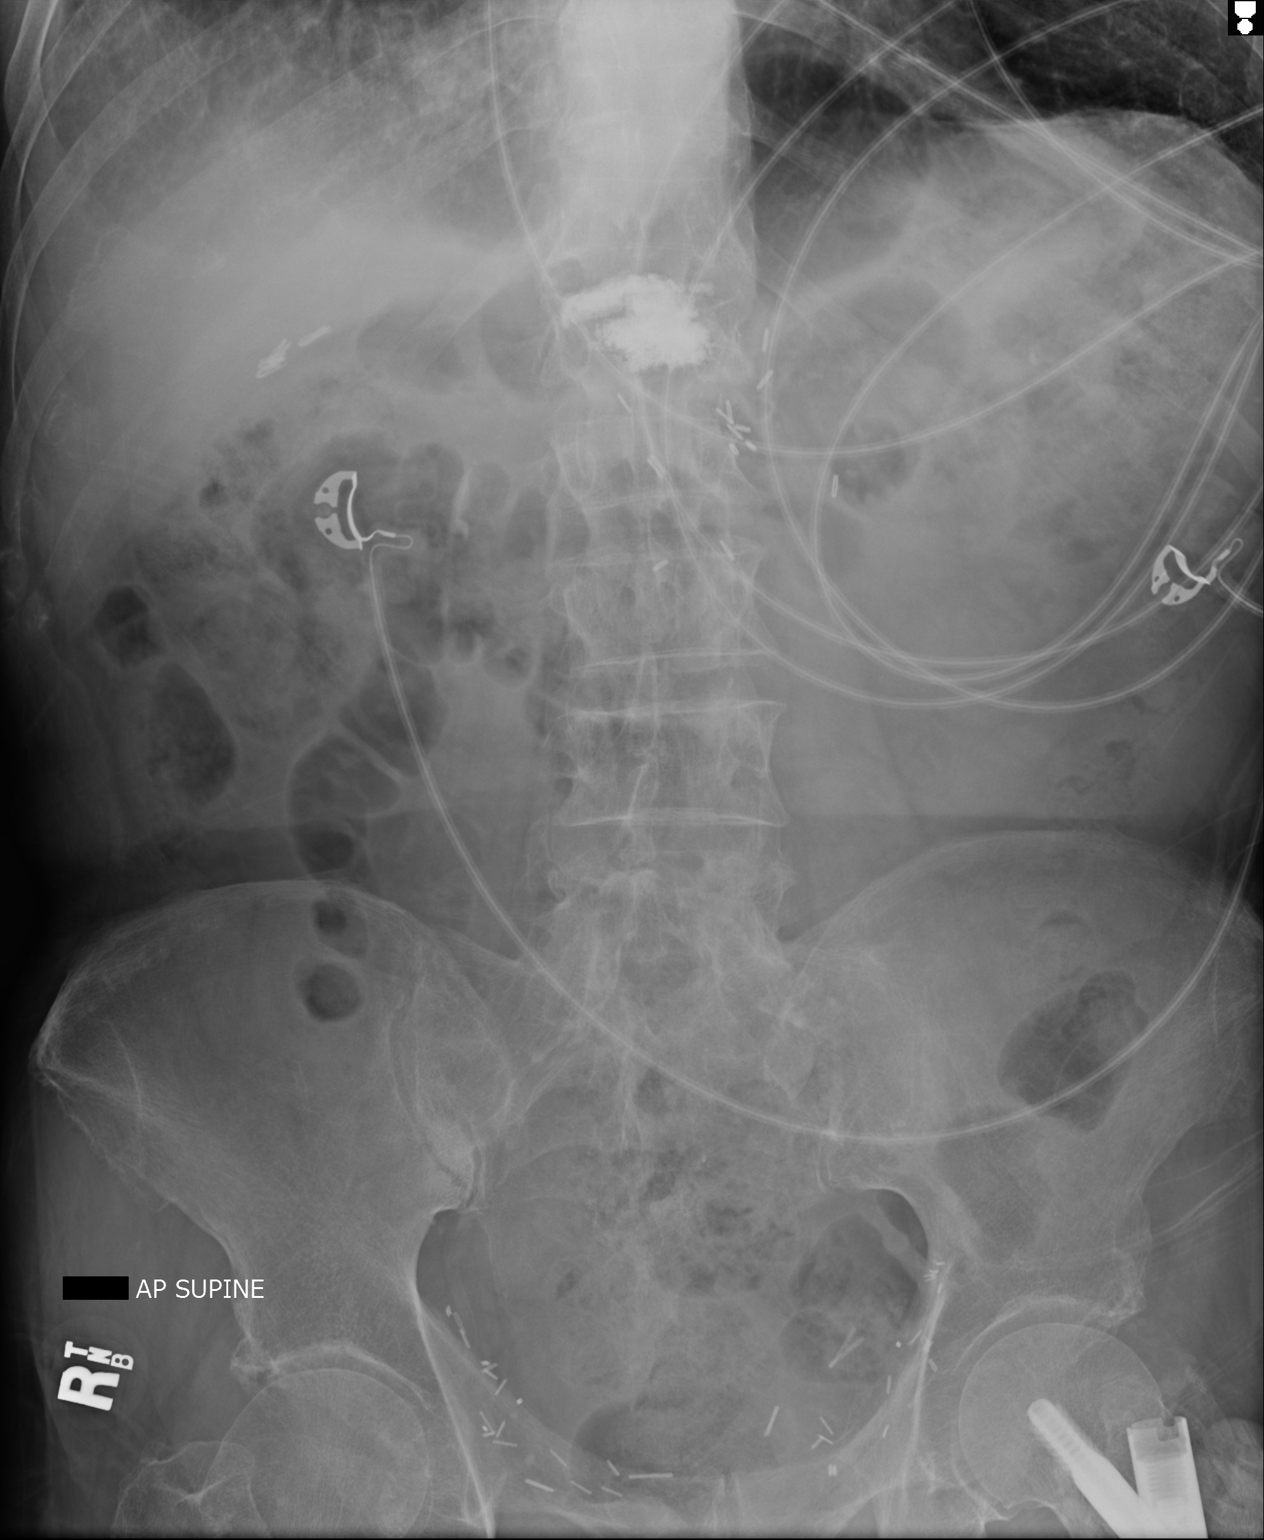

[2 of 2 positions shown; findings below may reference images not displayed]

FINDINGS: There is moderate stool in the colon. There is no bowel dilatation
or air-fluid level to suggest bowel obstruction. No free air. There
are surgical clips the pelvis. Patient has had kyphoplasty procedure
at L1. There are also surgical clips in the gallbladder fossa region
as well as in the upper abdomen just to the left of midline.
IMPRESSION: Moderate stool in colon. No bowel obstruction or free air. Multiple
areas of postoperative change noted.

## 2021-02-28 ENCOUNTER — Inpatient Hospital Stay (HOSPITAL_COMMUNITY)
Admission: EM | Admit: 2021-02-28 | Discharge: 2021-03-06 | DRG: 480 | Disposition: A | Discharge status: Skilled Nursing Facility | Payer: Medicare Other | Source: Skilled Nursing Facility | Attending: Student | Admitting: Student

## 2021-02-28 ENCOUNTER — Encounter (HOSPITAL_COMMUNITY): Payer: Self-pay | Admitting: Emergency Medicine

## 2021-02-28 DIAGNOSIS — Z419 Encounter for procedure for purposes other than remedying health state, unspecified: Secondary | ICD-10-CM

## 2021-02-28 DIAGNOSIS — Z781 Physical restraint status: Secondary | ICD-10-CM

## 2021-02-28 DIAGNOSIS — M898X9 Other specified disorders of bone, unspecified site: Secondary | ICD-10-CM | POA: Diagnosis present

## 2021-02-28 DIAGNOSIS — S7290XA Unspecified fracture of unspecified femur, initial encounter for closed fracture: Secondary | ICD-10-CM

## 2021-02-28 DIAGNOSIS — R1310 Dysphagia, unspecified: Secondary | ICD-10-CM | POA: Diagnosis present

## 2021-02-28 DIAGNOSIS — Z66 Do not resuscitate: Secondary | ICD-10-CM | POA: Diagnosis present

## 2021-02-28 DIAGNOSIS — Z7952 Long term (current) use of systemic steroids: Secondary | ICD-10-CM

## 2021-02-28 DIAGNOSIS — Z888 Allergy status to other drugs, medicaments and biological substances status: Secondary | ICD-10-CM

## 2021-02-28 DIAGNOSIS — D649 Anemia, unspecified: Secondary | ICD-10-CM | POA: Diagnosis present

## 2021-02-28 DIAGNOSIS — F03918 Unspecified dementia, unspecified severity, with other behavioral disturbance: Secondary | ICD-10-CM | POA: Diagnosis present

## 2021-02-28 DIAGNOSIS — Z8546 Personal history of malignant neoplasm of prostate: Secondary | ICD-10-CM

## 2021-02-28 DIAGNOSIS — I10 Essential (primary) hypertension: Secondary | ICD-10-CM | POA: Diagnosis present

## 2021-02-28 DIAGNOSIS — J449 Chronic obstructive pulmonary disease, unspecified: Secondary | ICD-10-CM | POA: Diagnosis present

## 2021-02-28 DIAGNOSIS — S72001A Fracture of unspecified part of neck of right femur, initial encounter for closed fracture: Secondary | ICD-10-CM

## 2021-02-28 DIAGNOSIS — Z85118 Personal history of other malignant neoplasm of bronchus and lung: Secondary | ICD-10-CM

## 2021-02-28 DIAGNOSIS — Z20822 Contact with and (suspected) exposure to covid-19: Secondary | ICD-10-CM | POA: Diagnosis present

## 2021-02-28 DIAGNOSIS — Z79899 Other long term (current) drug therapy: Secondary | ICD-10-CM

## 2021-02-28 DIAGNOSIS — Z22322 Carrier or suspected carrier of Methicillin resistant Staphylococcus aureus: Secondary | ICD-10-CM

## 2021-02-28 DIAGNOSIS — Z794 Long term (current) use of insulin: Secondary | ICD-10-CM

## 2021-02-28 DIAGNOSIS — S72334A Nondisplaced oblique fracture of shaft of right femur, initial encounter for closed fracture: Principal | ICD-10-CM | POA: Diagnosis present

## 2021-02-28 DIAGNOSIS — D509 Iron deficiency anemia, unspecified: Secondary | ICD-10-CM | POA: Diagnosis present

## 2021-02-28 DIAGNOSIS — F05 Delirium due to known physiological condition: Secondary | ICD-10-CM | POA: Diagnosis not present

## 2021-02-28 DIAGNOSIS — W19XXXA Unspecified fall, initial encounter: Secondary | ICD-10-CM

## 2021-02-28 DIAGNOSIS — K219 Gastro-esophageal reflux disease without esophagitis: Secondary | ICD-10-CM | POA: Diagnosis present

## 2021-02-28 DIAGNOSIS — W1830XA Fall on same level, unspecified, initial encounter: Secondary | ICD-10-CM | POA: Diagnosis present

## 2021-02-28 DIAGNOSIS — Z87891 Personal history of nicotine dependence: Secondary | ICD-10-CM

## 2021-02-28 DIAGNOSIS — Z7982 Long term (current) use of aspirin: Secondary | ICD-10-CM

## 2021-02-28 DIAGNOSIS — T148XXA Other injury of unspecified body region, initial encounter: Secondary | ICD-10-CM

## 2021-02-28 DIAGNOSIS — E1165 Type 2 diabetes mellitus with hyperglycemia: Secondary | ICD-10-CM | POA: Diagnosis present

## 2021-02-28 DIAGNOSIS — G9341 Metabolic encephalopathy: Secondary | ICD-10-CM | POA: Diagnosis not present

## 2021-02-28 DIAGNOSIS — D62 Acute posthemorrhagic anemia: Secondary | ICD-10-CM | POA: Diagnosis not present

## 2021-02-28 DIAGNOSIS — M9701XA Periprosthetic fracture around internal prosthetic right hip joint, initial encounter: Secondary | ICD-10-CM | POA: Diagnosis present

## 2021-02-28 DIAGNOSIS — S7291XA Unspecified fracture of right femur, initial encounter for closed fracture: Secondary | ICD-10-CM

## 2021-02-28 DIAGNOSIS — E785 Hyperlipidemia, unspecified: Secondary | ICD-10-CM | POA: Diagnosis present

## 2021-02-28 NOTE — ED Triage Notes (Signed)
Pt brought in EMS from [facility]. Pt had unwitnessed fall tonight. Pt c/o right hip pain, and back pain. Pt able to move all 4 extremeties.

## 2021-03-01 ENCOUNTER — Emergency Department (HOSPITAL_COMMUNITY): Payer: Medicare Other

## 2021-03-01 ENCOUNTER — Inpatient Hospital Stay (HOSPITAL_COMMUNITY): Payer: Medicare Other

## 2021-03-01 DIAGNOSIS — J449 Chronic obstructive pulmonary disease, unspecified: Secondary | ICD-10-CM

## 2021-03-01 DIAGNOSIS — I10 Essential (primary) hypertension: Secondary | ICD-10-CM

## 2021-03-01 DIAGNOSIS — Z66 Do not resuscitate: Secondary | ICD-10-CM | POA: Diagnosis present

## 2021-03-01 DIAGNOSIS — E785 Hyperlipidemia, unspecified: Secondary | ICD-10-CM | POA: Diagnosis present

## 2021-03-01 DIAGNOSIS — W1830XA Fall on same level, unspecified, initial encounter: Secondary | ICD-10-CM | POA: Diagnosis present

## 2021-03-01 DIAGNOSIS — Z20822 Contact with and (suspected) exposure to covid-19: Secondary | ICD-10-CM | POA: Diagnosis present

## 2021-03-01 DIAGNOSIS — M9701XA Periprosthetic fracture around internal prosthetic right hip joint, initial encounter: Secondary | ICD-10-CM | POA: Diagnosis present

## 2021-03-01 DIAGNOSIS — D62 Acute posthemorrhagic anemia: Secondary | ICD-10-CM | POA: Diagnosis not present

## 2021-03-01 DIAGNOSIS — F0390 Unspecified dementia without behavioral disturbance: Secondary | ICD-10-CM | POA: Diagnosis not present

## 2021-03-01 DIAGNOSIS — Z8546 Personal history of malignant neoplasm of prostate: Secondary | ICD-10-CM | POA: Diagnosis not present

## 2021-03-01 DIAGNOSIS — Z79899 Other long term (current) drug therapy: Secondary | ICD-10-CM | POA: Diagnosis not present

## 2021-03-01 DIAGNOSIS — S7291XA Unspecified fracture of right femur, initial encounter for closed fracture: Secondary | ICD-10-CM | POA: Diagnosis present

## 2021-03-01 DIAGNOSIS — F03918 Unspecified dementia, unspecified severity, with other behavioral disturbance: Secondary | ICD-10-CM | POA: Diagnosis present

## 2021-03-01 DIAGNOSIS — Y92129 Unspecified place in nursing home as the place of occurrence of the external cause: Secondary | ICD-10-CM | POA: Diagnosis not present

## 2021-03-01 DIAGNOSIS — S72334A Nondisplaced oblique fracture of shaft of right femur, initial encounter for closed fracture: Secondary | ICD-10-CM | POA: Diagnosis present

## 2021-03-01 DIAGNOSIS — K219 Gastro-esophageal reflux disease without esophagitis: Secondary | ICD-10-CM | POA: Diagnosis present

## 2021-03-01 DIAGNOSIS — M898X9 Other specified disorders of bone, unspecified site: Secondary | ICD-10-CM | POA: Diagnosis present

## 2021-03-01 DIAGNOSIS — Z794 Long term (current) use of insulin: Secondary | ICD-10-CM | POA: Diagnosis not present

## 2021-03-01 DIAGNOSIS — Z85118 Personal history of other malignant neoplasm of bronchus and lung: Secondary | ICD-10-CM | POA: Diagnosis not present

## 2021-03-01 DIAGNOSIS — D509 Iron deficiency anemia, unspecified: Secondary | ICD-10-CM | POA: Diagnosis present

## 2021-03-01 DIAGNOSIS — Z7982 Long term (current) use of aspirin: Secondary | ICD-10-CM | POA: Diagnosis not present

## 2021-03-01 DIAGNOSIS — R1310 Dysphagia, unspecified: Secondary | ICD-10-CM | POA: Diagnosis present

## 2021-03-01 DIAGNOSIS — S7291XD Unspecified fracture of right femur, subsequent encounter for closed fracture with routine healing: Secondary | ICD-10-CM

## 2021-03-01 DIAGNOSIS — W19XXXA Unspecified fall, initial encounter: Secondary | ICD-10-CM | POA: Diagnosis not present

## 2021-03-01 DIAGNOSIS — G9341 Metabolic encephalopathy: Secondary | ICD-10-CM | POA: Diagnosis not present

## 2021-03-01 DIAGNOSIS — E1169 Type 2 diabetes mellitus with other specified complication: Secondary | ICD-10-CM

## 2021-03-01 DIAGNOSIS — F05 Delirium due to known physiological condition: Secondary | ICD-10-CM | POA: Diagnosis not present

## 2021-03-01 DIAGNOSIS — D649 Anemia, unspecified: Secondary | ICD-10-CM | POA: Diagnosis not present

## 2021-03-01 DIAGNOSIS — E1165 Type 2 diabetes mellitus with hyperglycemia: Secondary | ICD-10-CM | POA: Diagnosis present

## 2021-03-01 DIAGNOSIS — Z87891 Personal history of nicotine dependence: Secondary | ICD-10-CM | POA: Diagnosis not present

## 2021-03-01 DIAGNOSIS — Z888 Allergy status to other drugs, medicaments and biological substances status: Secondary | ICD-10-CM | POA: Diagnosis not present

## 2021-03-01 DIAGNOSIS — J438 Other emphysema: Secondary | ICD-10-CM | POA: Diagnosis not present

## 2021-03-01 DIAGNOSIS — I959 Hypotension, unspecified: Secondary | ICD-10-CM | POA: Diagnosis not present

## 2021-03-01 DIAGNOSIS — Z7952 Long term (current) use of systemic steroids: Secondary | ICD-10-CM | POA: Diagnosis not present

## 2021-03-01 LAB — CBC WITH DIFFERENTIAL/PLATELET
Abs Immature Granulocytes: 0.02 10*3/uL (ref 0.00–0.07)
Basophils Absolute: 0.1 10*3/uL (ref 0.0–0.1)
Basophils Relative: 1 %
Eosinophils Absolute: 0.2 10*3/uL (ref 0.0–0.5)
Eosinophils Relative: 3 %
HCT: 40.5 % (ref 39.0–52.0)
Hemoglobin: 13.3 g/dL (ref 13.0–17.0)
Immature Granulocytes: 0 %
Lymphocytes Relative: 18 %
Lymphs Abs: 1.5 10*3/uL (ref 0.7–4.0)
MCH: 31.2 pg (ref 26.0–34.0)
MCHC: 32.8 g/dL (ref 30.0–36.0)
MCV: 95.1 fL (ref 80.0–100.0)
Monocytes Absolute: 1.1 10*3/uL — ABNORMAL HIGH (ref 0.1–1.0)
Monocytes Relative: 14 %
Neutro Abs: 5.3 10*3/uL (ref 1.7–7.7)
Neutrophils Relative %: 64 %
Platelets: 164 10*3/uL (ref 150–400)
RBC: 4.26 MIL/uL (ref 4.22–5.81)
RDW: 13.7 % (ref 11.5–15.5)
WBC: 8.1 10*3/uL (ref 4.0–10.5)
nRBC: 0 % (ref 0.0–0.2)

## 2021-03-01 LAB — BASIC METABOLIC PANEL
Anion gap: 7 (ref 5–15)
BUN: 25 mg/dL — ABNORMAL HIGH (ref 8–23)
CO2: 22 mmol/L (ref 22–32)
Calcium: 8.8 mg/dL — ABNORMAL LOW (ref 8.9–10.3)
Chloride: 109 mmol/L (ref 98–111)
Creatinine, Ser: 1.13 mg/dL (ref 0.61–1.24)
GFR, Estimated: >60 mL/min (ref >60)
Glucose, Bld: 100 mg/dL — ABNORMAL HIGH (ref 70–99)
Potassium: 3.9 mmol/L (ref 3.5–5.1)
Sodium: 138 mmol/L (ref 135–145)

## 2021-03-01 LAB — GLUCOSE, CAPILLARY: Glucose-Capillary: 108 mg/dL — ABNORMAL HIGH (ref 70–99)

## 2021-03-01 LAB — CBG MONITORING, ED
Glucose-Capillary: 112 mg/dL — ABNORMAL HIGH (ref 70–99)
Glucose-Capillary: 119 mg/dL — ABNORMAL HIGH (ref 70–99)
Glucose-Capillary: 122 mg/dL — ABNORMAL HIGH (ref 70–99)

## 2021-03-01 LAB — RESP PANEL BY RT-PCR (FLU A&B, COVID) ARPGX2
Influenza A by PCR: NEGATIVE
Influenza B by PCR: NEGATIVE
SARS Coronavirus 2 by RT PCR: NEGATIVE

## 2021-03-01 MED ORDER — TIOTROPIUM BROMIDE MONOHYDRATE 18 MCG IN CAPS: 18.0000 ug | ORAL_CAPSULE | Freq: Every day | RESPIRATORY_TRACT | Status: DC

## 2021-03-01 MED ORDER — BISACODYL 5 MG PO TBEC
5.0000 mg | DELAYED_RELEASE_TABLET | Freq: Every day | ORAL | Status: DC
  Administered 2021-03-01 – 2021-03-06 (×5): 5 mg via ORAL
  Filled 2021-03-01 (×6): qty 1

## 2021-03-01 MED ORDER — ATORVASTATIN CALCIUM 40 MG PO TABS
40.0000 mg | ORAL_TABLET | Freq: Every day | ORAL | Status: DC
Start: 2021-03-01 — End: 2021-03-06
  Administered 2021-03-03 – 2021-03-06 (×4): 40 mg via ORAL
  Filled 2021-03-01 (×5): qty 1

## 2021-03-01 MED ORDER — UMECLIDINIUM BROMIDE 62.5 MCG/ACT IN AEPB
1.0000 | INHALATION_SPRAY | Freq: Every day | RESPIRATORY_TRACT | Status: DC
  Administered 2021-03-02 – 2021-03-06 (×4): 1 via RESPIRATORY_TRACT
  Filled 2021-03-01: qty 7

## 2021-03-01 MED ORDER — POLYETHYLENE GLYCOL 3350 17 G PO PACK
17.0000 g | PACK | Freq: Every day | ORAL | Status: DC
  Administered 2021-03-01 – 2021-03-06 (×4): 17 g via ORAL
  Filled 2021-03-01 (×6): qty 1

## 2021-03-01 MED ORDER — INSULIN ASPART 100 UNIT/ML IJ SOLN: 0.0000 [IU] | Freq: Every day | INTRAMUSCULAR | Status: DC

## 2021-03-01 MED ORDER — FENTANYL CITRATE PF 50 MCG/ML IJ SOSY
50.0000 ug | PREFILLED_SYRINGE | Freq: Once | INTRAMUSCULAR | Status: AC
  Administered 2021-03-01: 50 ug via INTRAVENOUS
  Filled 2021-03-01: qty 1

## 2021-03-01 MED ORDER — FOOD THICKENER (SIMPLYTHICK)
1.0000 | ORAL | Status: DC | PRN
  Filled 2021-03-01: qty 1

## 2021-03-01 MED ORDER — PANTOPRAZOLE SODIUM 40 MG PO TBEC
40.0000 mg | DELAYED_RELEASE_TABLET | Freq: Two times a day (BID) | ORAL | Status: DC
  Administered 2021-03-01 – 2021-03-02 (×3): 40 mg via ORAL
  Filled 2021-03-01 (×2): qty 1

## 2021-03-01 MED ORDER — LORAZEPAM 2 MG/ML IJ SOLN
0.5000 mg | Freq: Four times a day (QID) | INTRAMUSCULAR | Status: DC | PRN
  Administered 2021-03-03 – 2021-03-04 (×2): 0.5 mg via INTRAVENOUS
  Filled 2021-03-01 (×3): qty 1

## 2021-03-01 MED ORDER — DONEPEZIL HCL 5 MG PO TABS
5.0000 mg | ORAL_TABLET | Freq: Every day | ORAL | Status: DC
  Administered 2021-03-01: 5 mg via ORAL
  Filled 2021-03-01: qty 1

## 2021-03-01 MED ORDER — MOMETASONE FURO-FORMOTEROL FUM 200-5 MCG/ACT IN AERO
2.0000 | INHALATION_SPRAY | Freq: Two times a day (BID) | RESPIRATORY_TRACT | Status: DC
  Administered 2021-03-01 – 2021-03-06 (×9): 2 via RESPIRATORY_TRACT
  Filled 2021-03-01: qty 8.8

## 2021-03-01 MED ORDER — SODIUM CHLORIDE 0.9 % IV SOLN: INTRAVENOUS | Status: AC

## 2021-03-01 MED ORDER — IPRATROPIUM-ALBUTEROL 0.5-2.5 (3) MG/3ML IN SOLN: 3.0000 mL | Freq: Four times a day (QID) | RESPIRATORY_TRACT | Status: DC | PRN

## 2021-03-01 MED ORDER — BISACODYL 10 MG RE SUPP
10.0000 mg | Freq: Once | RECTAL | Status: DC
  Filled 2021-03-01: qty 1

## 2021-03-01 MED ORDER — INSULIN GLARGINE-YFGN 100 UNIT/ML ~~LOC~~ SOLN
6.0000 [IU] | Freq: Every day | SUBCUTANEOUS | Status: DC
  Filled 2021-03-01 (×3): qty 0.06

## 2021-03-01 MED ORDER — LORAZEPAM 2 MG/ML IJ SOLN
0.5000 mg | Freq: Once | INTRAMUSCULAR | Status: AC
Start: 2021-03-01 — End: 2021-03-01
  Administered 2021-03-01: 0.5 mg via INTRAVENOUS
  Filled 2021-03-01: qty 1

## 2021-03-01 MED ORDER — OXYCODONE HCL 5 MG PO TABS
5.0000 mg | ORAL_TABLET | ORAL | Status: DC | PRN
  Administered 2021-03-03: 5 mg via ORAL
  Filled 2021-03-01 (×2): qty 1

## 2021-03-01 MED ORDER — DILTIAZEM HCL ER COATED BEADS 120 MG PO CP24
240.0000 mg | ORAL_CAPSULE | Freq: Every day | ORAL | Status: DC
  Administered 2021-03-02: 240 mg via ORAL
  Filled 2021-03-01: qty 2

## 2021-03-01 MED ORDER — ENSURE ENLIVE PO LIQD
237.0000 mL | Freq: Two times a day (BID) | ORAL | Status: DC
  Administered 2021-03-03 – 2021-03-06 (×5): 237 mL via ORAL
  Filled 2021-03-01 (×2): qty 237

## 2021-03-01 MED ORDER — FERROUS SULFATE 325 (65 FE) MG PO TABS
325.0000 mg | ORAL_TABLET | Freq: Two times a day (BID) | ORAL | Status: DC
  Filled 2021-03-01 (×2): qty 1

## 2021-03-01 MED ORDER — ACETAMINOPHEN 325 MG PO TABS
650.0000 mg | ORAL_TABLET | Freq: Once | ORAL | Status: AC
  Administered 2021-03-01: 650 mg via ORAL
  Filled 2021-03-01: qty 2

## 2021-03-01 MED ORDER — FENTANYL CITRATE PF 50 MCG/ML IJ SOSY
25.0000 ug | PREFILLED_SYRINGE | INTRAMUSCULAR | Status: DC | PRN
  Administered 2021-03-01 (×5): 25 ug via INTRAVENOUS
  Filled 2021-03-01 (×5): qty 1

## 2021-03-01 MED ORDER — INSULIN ASPART 100 UNIT/ML IJ SOLN
0.0000 [IU] | Freq: Three times a day (TID) | INTRAMUSCULAR | Status: DC
  Administered 2021-03-01 – 2021-03-03 (×3): 1 [IU] via SUBCUTANEOUS
  Administered 2021-03-03: 2 [IU] via SUBCUTANEOUS
  Administered 2021-03-05: 1 [IU] via SUBCUTANEOUS
  Filled 2021-03-01: qty 1

## 2021-03-01 NOTE — ED Notes (Addendum)
This nurse notified hospitalist on information from nursing home regarding diet order. Per information packet from nursing home pt is on a mechanical soft and nectar thickened diet.

## 2021-03-01 NOTE — H&P (Signed)
History and Physical  Thomas [name] GDJ:[id number] DOB: 09-Oct-1927 DOA: 02/28/2021  Referring physician: Merryl Hacker, MD  PCP: Hilbert Corrigan, MD  Patient coming from: [facility] nursing facility  Chief Complaint: [name]  HPI: Thomas [name] is a 88 y.o. male with medical history significant for  diabetes mellitus type 2, dyslipidemia, lung cancer, prostate cancer who presents to the emergency department via EMS due to a fall sustained at the facility.  Patient was unable to provide history possibly due to underlying dementia, history was obtained from ED physician and ED medical record.  Per report, patient had an unwitnessed fall, EMS was activated and on arrival of EMS team, patient was complaining of right hip and back pain.  There was no report of fever, chills, chest pain, shortness of breath.  ED Course:  In the emergency department, he was hemodynamically stable, work-up in the ED showed normal CBC and BMP except for elevated BUN at 25.  Influenza A, B, SARS coronavirus 2 was negative. CT of right hip without contrast showed acute nondisplaced oblique fracture of the proximal femoral diaphysis at and below the level of the femoral component of the hip arthroplasty. CT lumbar spine without contrast showed no acute fracture or traumatic listhesis. Right knee with or without pelvis x-ray showed no evidence of fractures. Patient was treated with IV fentanyl and lorazepam. Orthopedics consulted Dr. Percell Miller due to patient's prior hip replacement, patient was asked to be admitted to [facility] by Dr. Yetta Numbers per ED physician's medical record.  Review of Systems: This cannot be obtained at this time due to patient's current condition  Past Medical History:  Diagnosis Date   Anginal pain ([place])    Cancer ([place])    Diabetes mellitus    Hyperlipemia    Lung cancer ([place])    Prostate cancer [facility])    Past Surgical History:  Procedure Laterality Date   BACK SURGERY      CARDIAC SURGERY     ESOPHAGOGASTRODUODENOSCOPY N/A 07/11/2016   Procedure: ESOPHAGOGASTRODUODENOSCOPY (EGD);  Surgeon: Rogene [place], MD;  [facility];  Service: Endoscopy;  Laterality: N/A;   HIP ARTHROPLASTY Right 10/03/2016   Procedure: ARTHROPLASTY BIPOLAR HIP (HEMIARTHROPLASTY) with cabeling;  Surgeon: Renette Butters, MD;  Location: [place];  Service: Orthopedics;  Laterality: Right;   INTRAMEDULLARY (IM) NAIL INTERTROCHANTERIC Left 05/04/2016   Procedure: INTRAMEDULLARY (IM) NAIL INTERTROCHANTRIC;  Surgeon: Renette Butters, MD;  Location: [place];  Service: Orthopedics;  Laterality: Left;   PROSTATE SURGERY     surgery to repair aneurysm     TUMOR REMOVAL     from lung    Social History:  reports that he quit smoking about 27 years ago. His smoking use included cigarettes. He has a 50.00 pack-year smoking history. He has quit using smokeless tobacco.  His smokeless tobacco use included chew. He reports that he does not drink alcohol and does not use drugs.   Allergies  Allergen Reactions   Nubain [Nalbuphine Hcl]     Listed on MAR-no reaction is noted    History reviewed. No pertinent family history.   Prior to Admission medications   Medication Sig Start Date End Date Taking? Authorizing Provider  acetaminophen (TYLENOL) 500 MG tablet Take 1 tablet (500 mg total) by mouth every 8 (eight) hours as needed for moderate pain. 10/05/16   Arrien, Jimmy Picket, MD  aspirin EC 325 MG tablet Take 1 tablet (325 mg total) by mouth daily. For 30  days post op for DVT Prophylaxis 10/03/16   Prudencio Burly III, PA-C  atorvastatin (LIPITOR) 40 MG tablet Take 40 mg by mouth daily.    [provider]  budesonide-formoterol (SYMBICORT) 160-4.5 MCG/ACT inhaler Inhale 2 puffs into the lungs 2 (two) times daily.    [provider]  citalopram (CELEXA) 10 MG tablet Take 1 tablet (10 mg total) by mouth daily. 07/10/15   Isaac Bliss, Rayford Halsted, MD  diltiazem  (CARDIZEM CD) 240 MG 24 hr capsule TAKE ONE CAPSULE ORALLY EVERY MORNING. 03/01/15   Lendon Colonel, NP  donepezil (ARICEPT) 5 MG tablet Take 5 mg by mouth at bedtime.    [provider]  feeding supplement, ENSURE ENLIVE, (ENSURE ENLIVE) LIQD Take 237 mLs by mouth 2 (two) times daily with a meal. 10/05/16   Arrien, Jimmy Picket, MD  ferrous sulfate 325 (65 FE) MG tablet Take 325 mg by mouth 2 (two) times daily with a meal.     [provider]  HYDROcodone-acetaminophen (NORCO) 5-325 MG tablet Take 1-2 tablets by mouth every 6 (six) hours as needed for moderate pain. 10/03/16   Prudencio Burly III, PA-C  insulin glargine (LANTUS) 100 UNIT/ML injection Inject 12 Units into the skin at bedtime.    [provider]  ipratropium-albuterol (DUONEB) 0.5-2.5 (3) MG/3ML SOLN Take 3 mLs by nebulization every 6 (six) hours as needed (dyspnea cough/wheezing).    [provider]  isosorbide mononitrate (IMDUR) 30 MG 24 hr tablet TAKE ONE TABLET BY MOUTH ONCE DAILY. 02/17/15   Lendon Colonel, NP  Multiple Vitamins-Minerals (PRESERVISION AREDS) CAPS Take 1 capsule by mouth 2 (two) times daily.    [provider]  oxybutynin (DITROPAN-XL) 10 MG 24 hr tablet Take 10 mg by mouth at bedtime.    [provider]  pantoprazole (PROTONIX) 40 MG tablet Take 1 tablet (40 mg total) by mouth 2 (two) times daily. 07/13/16   Orvan Falconer, MD  polyethylene glycol [facility] / Floria Raveling) packet Take 17 g by mouth daily as needed for mild constipation. 10/05/16   Arrien, Jimmy Picket, MD  senna (SENOKOT) 8.6 MG TABS tablet Take 1 tablet (8.6 mg total) by mouth 2 (two) times daily as needed for mild constipation. 05/06/16   Patrecia Pour, MD  tiotropium (SPIRIVA) 18 MCG inhalation capsule Place 18 mcg into inhaler and inhale daily.    [provider]  Vitamin D, Ergocalciferol, (DRISDOL) 50000 units CAPS capsule Take 50,000 Units by mouth every 7 (seven) days.     [provider]    Physical Exam: BP (!) 168/87    Pulse 89    Temp 97.9 F (36.6 C) (Oral)    Resp 20    Ht 5\' 11"  (1.803 m)    Wt 70.3 kg    SpO2 95%    BMI 21.62 kg/m   General: 88 y.o. year-old male elderly in no acute distress.  Alert and oriented x3. HEENT: NCAT, EOMI Neck: Supple, trachea medial Cardiovascular: Regular rate and rhythm with no rubs or gallops.  No thyromegaly or JVD noted.  No lower extremity edema. 2/4 pulses in all 4 extremities. Respiratory: Clear to auscultation with no wheezes or rales. Good inspiratory effort. Abdomen: Soft, nontender nondistended with normal bowel sounds x4 quadrants. Muskuloskeletal: Tender to palpation of right hip, decreased ROM of RLE due to pain.   Neuro: CN II-XII intact, sensation, reflexes intact Skin: No ulcerative lesions noted or rashes Psychiatry: Mood is appropriate for condition  and setting          Labs on Admission:  Basic Metabolic Panel: Recent Labs  Lab 03/01/21 0349  NA 138  K 3.9  CL 109  CO2 22  GLUCOSE 100*  BUN 25*  CREATININE 1.13  CALCIUM 8.8*   Liver Function Tests: No results for input(s): AST, ALT, ALKPHOS, BILITOT, PROT, ALBUMIN in the last 168 hours. No results for input(s): LIPASE, AMYLASE in the last 168 hours. No results for input(s): AMMONIA in the last 168 hours. CBC: Recent Labs  Lab 03/01/21 0349  WBC 8.1  NEUTROABS 5.3  HGB 13.3  HCT 40.5  MCV 95.1  PLT 164   Cardiac Enzymes: No results for input(s): CKTOTAL, CKMB, CKMBINDEX, TROPONINI in the last 168 hours.  BNP (last 3 results) No results for input(s): BNP in the last 8760 hours.  ProBNP (last 3 results) No results for input(s): PROBNP in the last 8760 hours.  CBG: No results for input(s): GLUCAP in the last 168 hours.  Radiological Exams on Admission: CT Lumbar Spine Wo Contrast  Result Date: 03/01/2021 CLINICAL DATA:  Back trauma, unwitnessed fall EXAM: CT LUMBAR SPINE WITHOUT CONTRAST TECHNIQUE:  Multidetector CT imaging of the lumbar spine was performed without intravenous contrast administration. Multiplanar CT image reconstructions were also generated. RADIATION DOSE REDUCTION: This exam was performed according to the departmental dose-optimization program which includes automated exposure control, adjustment of the mA and/or kV according to patient size and/or use of iterative reconstruction technique. COMPARISON:  No prior lumbar spine CT, correlation is made with CT abdomen pelvis 07/10/2016 and lumbar spine MRI 03/17/2009. FINDINGS: Segmentation: 5 lumbar type vertebrae. Alignment: Unchanged mild retrolisthesis L5 on S1, unchanged. No acute listhesis. Mild straightening of the normal lumbar lordosis. Vertebrae: No acute fracture or focal pathologic lesion. Redemonstrated compression deformity of L1, status post kyphoplasty, which appears unchanged compared to 2018. Osseous fusion across L5-S1. Degenerative osseous fusion across the left sacroiliac joint. Paraspinal and other soft tissues: Aortic atherosclerosis. Disc levels: No significant degenerative changes at T12-L4. L4-L5: Broad-based disc bulge. Mild-to-moderate facet arthropathy. Ligamentum flavum hypertrophy. No definite spinal canal stenosis. Mild bilateral neural foraminal narrowing. L5-S1: Osseous fusion with fixed retrolisthesis. Moderate facet arthropathy. No spinal canal stenosis. Mild left-greater-than-right neural foraminal narrowing. IMPRESSION: 1. No acute fracture or traumatic listhesis. 2. Unchanged appearance of L1 compression deformity, status post kyphoplasty. 3. Degenerative changes in the lower lumbar spine and sacrum, with mild neural foraminal narrowing at L4-L5 and L5-S1. Electronically Signed   By: Merilyn Baba M.D.   On: 03/01/2021 02:01   CT Hip Right Wo Contrast  Result Date: 03/01/2021 CLINICAL DATA:  Trauma. EXAM: CT OF THE RIGHT HIP WITHOUT CONTRAST TECHNIQUE: Multidetector CT imaging of the right hip was  performed according to the standard protocol. Multiplanar CT image reconstructions were also generated. RADIATION DOSE REDUCTION: This exam was performed according to the departmental dose-optimization program which includes automated exposure control, adjustment of the mA and/or kV according to patient size and/or use of iterative reconstruction technique. COMPARISON:  Right hip x-ray 03/01/2021. CT abdomen and pelvis 07/10/2016. FINDINGS: Bones/Joint/Cartilage There is a right hip arthroplasty in anatomic alignment. There is no evidence for hardware loosening. Or proximal portion of fracture is near the cerclage wire and distal aspect of the fracture is 2.5 cm distal to the tip of the femoral component of the arthroplasty. There is a healed right inferior pubic ramus fracture. No dislocation. Ligaments Suboptimally assessed by CT. Muscles and Tendons Grossly within normal limits.  Limited evaluation secondary to streak artifact. Soft tissues Grossly within normal limits. Limited evaluation secondary to streak artifact. IMPRESSION: 1. Acute nondisplaced oblique fracture of the proximal femoral diaphysis at and below the level of the femoral component of the hip arthroplasty. 2. Right hip arthroplasty in anatomic alignment. Electronically Signed   By: Ronney Asters M.D.   On: 03/01/2021 02:01   DG HIP UNILAT WITH PELVIS 2-3 VIEWS RIGHT  Result Date: 03/01/2021 CLINICAL DATA:  Unwitnessed fall.  Right hip and back pain. EXAM: DG HIP (WITH OR WITHOUT PELVIS) 2-3V RIGHT COMPARISON:  Similar study 10/04/2016. FINDINGS: Generalized osteopenia. Right hip arthroplasty is again noted without evidence of significant loosening, dislocation of the prosthesis, or perihardware fractures. On the left, an old hip nailing is again noted and there is increased flattening of the superior femoral head consistent with avascular necrosis with moderate secondary DJD also increased. There are numerous surgical clips along the pelvic  sidewalls. Ankylosis of portions of both SI joints. Pelvic enthesopathy is again shown as well.  Penile implant. IMPRESSION: 1. Osteopenia, degenerative and postsurgical change. 2. No evidence of fractures. 3. Increased superior left femoral head flattening consistent with interval avascular necrosis with articular surface subsidence, moderate secondary DJD. Electronically Signed   By: Telford Nab M.D.   On: 03/01/2021 00:49    EKG: I independently viewed the EKG done and my findings are as followed: Sinus arrhythmia at a rate of 73 bpm with RBBB  Assessment/Plan Present on Admission:  Right femoral fracture (HCC)  Principal Problem:   Right femoral fracture (HCC)  Acute nondisplaced oblique fracture of the proximal femoral diaphysis Continue IV fentanyl 25 mcg every 2 hours as needed Continue fall precautions and neurochecks Consider PT/OT eval and treat status post surgical repair/recommendation (patient is currently on bedrest) Orthopedic surgeon was consulted and recommended admitting [facility] to [facility]  Hypertension uncontrolled) Continue Cardizem  Hyperlipidemia Continue Lipitor  COPD continue DuoNeb, Spiriva and Dulera  T2DM Continue ISS and hypoglycemic protocol Continue Semglee 6 units (patient takes 12 units of Lantus at bedtime) and adjust dose accordingly  Dementia Continue Aricept  GERD Continue Protonix  Other Meds: Ferrous sulfate  DVT prophylaxis: SCDs  Code Status: DNR  Family Communication: None at bedside  Disposition Plan:  Patient is from:                        home Anticipated DC to:                   SNF or family members home Anticipated DC date:               2-3 days Anticipated DC barriers:          Patient requires inpatient management due to femoral fracture requiring possible surgical approach.  Consults called: Orthopedic surgery  Admission status: Inpatient    Bernadette Hoit MD [facility] 03/01/2021, 6:51 AM

## 2021-03-01 NOTE — Progress Notes (Addendum)
Patient seen and evaluated, chart reviewed, please see EMR for updated orders. Please see full H&P dictated by admitting physician Dr Josephine Cables for same date of service.    Brief Summary:- 88 y.o. male with medical history significant for  diabetes mellitus type 2, dyslipidemia, lung cancer, prostate cancer and advanced dementia with cognitive and memory deficit admitted from [facility] SNF on 03/01/21 with Acute nondisplaced oblique fracture of the proximal femoral diaphysis    A/p 1) Acute nondisplaced oblique fracture of the proximal femoral diaphysis -Orthopedic consult requested -IV fentanyl as needed pain control -Nursing order placed for RN to call and Notify orthopedic team --Dr. Altamese [place] and his physician assistant Ignacia Felling when patient arrives at [place] -N.p.o. after midnight   2)Social/Ethics -[facility] SNF resident, DNR status, patient's DSS/legal guardian at [place] office is  is Mr Cecille Rubin  [phone]  3)DM2-  -Lantus insulin 6 units nightly, Use Novolog/Humalog Sliding scale insulin with Accu-Cheks/Fingersticks as ordered   4)FEN/history of dysphagia--- continue mechanically soft diet along with nectar thickened liquids, patient will be n.p.o. after midnight for possible operative intervention  HLD/GERD/HTN/COPD/chronic iron deficiency anemia/dementia--resume PTA meds  Patient seen and evaluated, chart reviewed, please see EMR for updated orders. Please see full H&P dictated by admitting physician Dr Josephine Cables for same date of service.   Total care time 43 minutes-    Roxan Hockey, MD

## 2021-03-01 NOTE — Progress Notes (Signed)
°  Transition of Care [facility]) Screening Note   Patient Details  Name: Thomas [name] Date of Birth: 06-29-1927   Transition of Care [facility]) CM/SW Contact:    Boneta Lucks, RN Phone Number: 03/01/2021, 10:31 AM  Transfer to [facility] for surgical services.  Transition of Care Department [facility]) has reviewed patient and no TOC needs have been identified at this time. We will continue to monitor patient advancement through interdisciplinary progression rounds. If new patient transition needs arise, please place a TOC consult.

## 2021-03-01 NOTE — ED Notes (Signed)
Per nursing staff at [facility] has to crush PO meds.

## 2021-03-01 NOTE — ED Notes (Signed)
Pt's legal guardian, DSS, called to ask if pt was still residing at [facility] ED. Legal guardian was updated on pt's boarding status.

## 2021-03-01 NOTE — ED Notes (Signed)
Legal guardian updated on plan of care.

## 2021-03-01 NOTE — ED Notes (Signed)
Hospitalist made aware on axillary temp.

## 2021-03-01 NOTE — ED Provider Notes (Signed)
[facility] Provider Note   CSN: [id number] Arrival date & time: 02/28/21  2337     History  Chief Complaint  Patient presents with   Thomas [name] is a 88 y.o. male.  HPI  This is a 88 year old male with a history of diabetes, hyperlipidemia, dementia who presents from [facility] with concerns for a fall.  Fall was unwitnessed.  Complaining of right hip and back pain per EMS.  Per EMS he was noted to move all 4 extremities.  On my evaluation, patient is unable to provide any reliable information.  He reports low back pain and right hip pain.  Level 5 caveat for dementia  Home Medications Prior to Admission medications   Medication Sig Start Date End Date Taking? Authorizing Provider  acetaminophen (TYLENOL) 500 MG tablet Take 1 tablet (500 mg total) by mouth every 8 (eight) hours as needed for moderate pain. 10/05/16   Arrien, Jimmy Picket, MD  aspirin EC 325 MG tablet Take 1 tablet (325 mg total) by mouth daily. For 30 days post op for DVT Prophylaxis 10/03/16   Prudencio Burly III, PA-C  atorvastatin (LIPITOR) 40 MG tablet Take 40 mg by mouth daily.    [provider]  budesonide-formoterol (SYMBICORT) 160-4.5 MCG/ACT inhaler Inhale 2 puffs into the lungs 2 (two) times daily.    [provider]  citalopram (CELEXA) 10 MG tablet Take 1 tablet (10 mg total) by mouth daily. 07/10/15   Isaac Bliss, Rayford Halsted, MD  diltiazem (CARDIZEM CD) 240 MG 24 hr capsule TAKE ONE CAPSULE ORALLY EVERY MORNING. 03/01/15   Lendon Colonel, NP  donepezil (ARICEPT) 5 MG tablet Take 5 mg by mouth at bedtime.    [provider]  feeding supplement, ENSURE ENLIVE, (ENSURE ENLIVE) LIQD Take 237 mLs by mouth 2 (two) times daily with a meal. 10/05/16   Arrien, Jimmy Picket, MD  ferrous sulfate 325 (65 FE) MG tablet Take 325 mg by mouth 2 (two) times daily with a meal.     [provider]  HYDROcodone-acetaminophen (NORCO)  5-325 MG tablet Take 1-2 tablets by mouth every 6 (six) hours as needed for moderate pain. 10/03/16   Prudencio Burly III, PA-C  insulin glargine (LANTUS) 100 UNIT/ML injection Inject 12 Units into the skin at bedtime.    [provider]  ipratropium-albuterol (DUONEB) 0.5-2.5 (3) MG/3ML SOLN Take 3 mLs by nebulization every 6 (six) hours as needed (dyspnea cough/wheezing).    [provider]  isosorbide mononitrate (IMDUR) 30 MG 24 hr tablet TAKE ONE TABLET BY MOUTH ONCE DAILY. 02/17/15   Lendon Colonel, NP  Multiple Vitamins-Minerals (PRESERVISION AREDS) CAPS Take 1 capsule by mouth 2 (two) times daily.    [provider]  oxybutynin (DITROPAN-XL) 10 MG 24 hr tablet Take 10 mg by mouth at bedtime.    [provider]  pantoprazole (PROTONIX) 40 MG tablet Take 1 tablet (40 mg total) by mouth 2 (two) times daily. 07/13/16   Orvan Falconer, MD  polyethylene glycol [facility] Floria Raveling) packet Take 17 g by mouth daily as needed for mild constipation. 10/05/16   Arrien, Jimmy Picket, MD  senna (SENOKOT) 8.6 MG TABS tablet Take 1 tablet (8.6 mg total) by mouth 2 (two) times daily as needed for mild constipation. 05/06/16   Patrecia Pour, MD  tiotropium (SPIRIVA) 18 MCG inhalation capsule Place 18 mcg into inhaler and inhale daily.    [provider]  Vitamin D, Ergocalciferol, (DRISDOL) 50000 units CAPS capsule Take 50,000 Units by mouth every 7 (seven) days.    [provider]      Allergies    Nubain [nalbuphine hcl]    Review of Systems   Review of Systems  Unable to perform ROS: Dementia   Physical Exam Updated Vital Signs BP (!) 168/87    Pulse 89    Temp 97.9 F (36.6 C) (Oral)    Resp 20    Ht 1.803 m (5\' 11" )    Wt 70.3 kg    SpO2 95%    BMI 21.62 kg/m  Physical Exam Vitals and nursing note reviewed.  Constitutional:      Appearance: He is well-developed.     Comments: Elderly, nontoxic-appearing  HENT:     Head: Normocephalic  and atraumatic.     Mouth/Throat:     Mouth: Mucous membranes are moist.  Eyes:     Pupils: Pupils are equal, round, and reactive to light.  Cardiovascular:     Rate and Rhythm: Normal rate and regular rhythm.     Heart sounds: Normal heart sounds. No murmur heard. Pulmonary:     Effort: Pulmonary effort is normal. No respiratory distress.     Breath sounds: Normal breath sounds. No wheezing.  Abdominal:     General: Bowel sounds are normal.     Palpations: Abdomen is soft.     Tenderness: There is no abdominal tenderness. There is no rebound.  Musculoskeletal:     Cervical back: Neck supple.     Comments: Pain with range of motion of the right hip, no obvious deformity noted, prior surgical scars noted, tenderness palpation lower midline L-spine, no step-off or deformity noted  Lymphadenopathy:     Cervical: No cervical adenopathy.  Skin:    General: Skin is warm and dry.  Neurological:     Mental Status: He is alert.     Comments: Oriented only to self, appears to move all 4 extremities equally  Psychiatric:        Mood and Affect: Mood normal.    ED Results / Procedures / Treatments   Labs (all labs ordered are listed, but only abnormal results are displayed) Labs Reviewed  CBC WITH DIFFERENTIAL/PLATELET - Abnormal; Notable for the following components:      Result Value   Monocytes Absolute 1.1 (*)    All other components within normal limits  BASIC METABOLIC PANEL - Abnormal; Notable for the following components:   Glucose, Bld 100 (*)    BUN 25 (*)    Calcium 8.8 (*)    All other components within normal limits  RESP PANEL BY RT-PCR (FLU A&B, COVID) [id number]    EKG EKG Interpretation  Date/Time:  Thursday March 01 2021 03:49:12 EST Ventricular Rate:  73 PR Interval:  188 QRS Duration: 150 QT Interval:  423 QTC Calculation: 467 R Axis:   265 Text Interpretation: Sinus arrhythmia Right bundle branch block Baseline wander in lead(s) V6 When compared with ECG  of 10/02/2016, No significant change was found Confirmed by Delora Fuel ([id number]) on 03/01/2021 4:18:45 AM  Radiology CT Lumbar Spine Wo Contrast  Result Date: 03/01/2021 CLINICAL DATA:  Back trauma, unwitnessed fall EXAM: CT LUMBAR SPINE WITHOUT CONTRAST TECHNIQUE: Multidetector CT imaging of the lumbar spine was performed without intravenous contrast administration. Multiplanar CT image reconstructions were also generated. RADIATION DOSE REDUCTION: This exam was performed according to the departmental dose-optimization program which includes automated exposure control, adjustment  of the mA and/or kV according to patient size and/or use of iterative reconstruction technique. COMPARISON:  No prior lumbar spine CT, correlation is made with CT abdomen pelvis 07/10/2016 and lumbar spine MRI 03/17/2009. FINDINGS: Segmentation: 5 lumbar type vertebrae. Alignment: Unchanged mild retrolisthesis L5 on S1, unchanged. No acute listhesis. Mild straightening of the normal lumbar lordosis. Vertebrae: No acute fracture or focal pathologic lesion. Redemonstrated compression deformity of L1, status post kyphoplasty, which appears unchanged compared to 2018. Osseous fusion across L5-S1. Degenerative osseous fusion across the left sacroiliac joint. Paraspinal and other soft tissues: Aortic atherosclerosis. Disc levels: No significant degenerative changes at T12-L4. L4-L5: Broad-based disc bulge. Mild-to-moderate facet arthropathy. Ligamentum flavum hypertrophy. No definite spinal canal stenosis. Mild bilateral neural foraminal narrowing. L5-S1: Osseous fusion with fixed retrolisthesis. Moderate facet arthropathy. No spinal canal stenosis. Mild left-greater-than-right neural foraminal narrowing. IMPRESSION: 1. No acute fracture or traumatic listhesis. 2. Unchanged appearance of L1 compression deformity, status post kyphoplasty. 3. Degenerative changes in the lower lumbar spine and sacrum, with mild neural foraminal narrowing at  L4-L5 and L5-S1. Electronically Signed   By: Merilyn Baba M.D.   On: 03/01/2021 02:01   CT Hip Right Wo Contrast  Result Date: 03/01/2021 CLINICAL DATA:  Trauma. EXAM: CT OF THE RIGHT HIP WITHOUT CONTRAST TECHNIQUE: Multidetector CT imaging of the right hip was performed according to the standard protocol. Multiplanar CT image reconstructions were also generated. RADIATION DOSE REDUCTION: This exam was performed according to the departmental dose-optimization program which includes automated exposure control, adjustment of the mA and/or kV according to patient size and/or use of iterative reconstruction technique. COMPARISON:  Right hip x-ray 03/01/2021. CT abdomen and pelvis 07/10/2016. FINDINGS: Bones/Joint/Cartilage There is a right hip arthroplasty in anatomic alignment. There is no evidence for hardware loosening. Or proximal portion of fracture is near the cerclage wire and distal aspect of the fracture is 2.5 cm distal to the tip of the femoral component of the arthroplasty. There is a healed right inferior pubic ramus fracture. No dislocation. Ligaments Suboptimally assessed by CT. Muscles and Tendons Grossly within normal limits. Limited evaluation secondary to streak artifact. Soft tissues Grossly within normal limits. Limited evaluation secondary to streak artifact. IMPRESSION: 1. Acute nondisplaced oblique fracture of the proximal femoral diaphysis at and below the level of the femoral component of the hip arthroplasty. 2. Right hip arthroplasty in anatomic alignment. Electronically Signed   By: Ronney Asters M.D.   On: 03/01/2021 02:01   DG HIP UNILAT WITH PELVIS 2-3 VIEWS RIGHT  Result Date: 03/01/2021 CLINICAL DATA:  Unwitnessed fall.  Right hip and back pain. EXAM: DG HIP (WITH OR WITHOUT PELVIS) 2-3V RIGHT COMPARISON:  Similar study 10/04/2016. FINDINGS: Generalized osteopenia. Right hip arthroplasty is again noted without evidence of significant loosening, dislocation of the prosthesis, or  perihardware fractures. On the left, an old hip nailing is again noted and there is increased flattening of the superior femoral head consistent with avascular necrosis with moderate secondary DJD also increased. There are numerous surgical clips along the pelvic sidewalls. Ankylosis of portions of both SI joints. Pelvic enthesopathy is again shown as well.  Penile implant. IMPRESSION: 1. Osteopenia, degenerative and postsurgical change. 2. No evidence of fractures. 3. Increased superior left femoral head flattening consistent with interval avascular necrosis with articular surface subsidence, moderate secondary DJD. Electronically Signed   By: Telford Nab M.D.   On: 03/01/2021 00:49    Procedures Procedures    Medications Ordered in ED Medications  fentaNYL (SUBLIMAZE)  injection 50 mcg (50 mcg Intravenous Given 03/01/21 0122)  LORazepam (ATIVAN) injection 0.5 mg (0.5 mg Intravenous Given 03/01/21 0402)    ED Course/ Medical Decision Making/ A&P Clinical Course as of 03/01/21 0448  Thu Mar 01, 2021  0104 Patient is normally independently ambulatory.  He is complaining of significant right hip pain and back pain with attempts of ambulation.  Will obtain CT. [CH]  0104 Attempted to call patient's wife regarding periprosthetic hip fracture.  No answer. [CH]  0235 Patient is a ward of [facility].  Previously had a wife listed on file.  Unclear whether she is still living.  Orthopedics consulted, Dr. Percell Miller given his prior hip replacement. [CH]  0932 Spoke with Dr. Yetta Numbers, orthopedics.  Requests admission at [facility].  Will discuss with hospitalist.  Hopeful for nonoperative management. [CH]    Clinical Course User Index [CH] Phynix , Barbette Hair, MD                           Medical Decision Making Amount and/or Complexity of Data Reviewed Labs: ordered. Radiology: ordered. ECG/medicine tests: ordered.  Risk Prescription drug management. Decision regarding  hospitalization.   This patient presents to the ED for concern of fall, this involves an extensive number of treatment options, and is a complaint that carries with it a high risk of complications and morbidity.  The differential diagnosis includes hip fracture, back fracture  MDM:    88 year old male who presents following a fall.  Reports hip and neck pain.  He does not contribute much to history.  He is nontoxic.  No obvious head trauma.  Denies hitting his head and is on no blood thinners.  X-rays read as negative.  Patient was unable to bear weight.  For this reason, CT scans were obtained.  CT is concerning for a periprosthetic right hip fracture.  See clinical course above and discussion with orthopedist.  Patient was given IV pain medication.  Plan for admission and orthopedic evaluation.  He will need to be [facility] at [facility]. (Labs, imaging)  Labs: I Ordered, and personally interpreted labs.  The pertinent results include: CBC, BMP unremarkable  Imaging Studies ordered: I ordered imaging studies including x-ray right hip, CT right hip and lower back notable for periprosthetic hip fracture I independently visualized and interpreted imaging. I agree with the radiologist interpretation  Additional history obtained from SNF.  External records from outside source obtained and reviewed including prior operative note  Critical Interventions: IV pain medication  Consultations: I requested consultation with the orthopedics,  and discussed lab and imaging findings as well as pertinent plan - they recommend: Admission to [facility]  Cardiac Monitoring: The patient was maintained on a cardiac monitor.  I personally viewed and interpreted the cardiac monitored which showed an underlying rhythm of: Normal sinus rhythm  Reevaluation: After the interventions noted above, I reevaluated the patient and found that they have :improved   Considered admission for: Periprosthetic hip  fracture  Social Determinants of Health: He is a ward of [facility]  Disposition: Admit  Co morbidities that complicate the patient evaluation  Past Medical History:  Diagnosis Date   Anginal pain ([place])    Cancer ([place])    Diabetes mellitus    Hyperlipemia    Lung cancer ([place])    Prostate cancer ([place])      Medicines Meds ordered this encounter  Medications   fentaNYL (SUBLIMAZE) injection 50 mcg  LORazepam (ATIVAN) injection 0.5 mg    I have reviewed the patients home medicines and have made adjustments as needed  Problem List / ED Course: Problem List Items Addressed This Visit   None Visit Diagnoses     Closed fracture of right hip, initial encounter ([place])    -  Primary   Fall       Relevant Orders   DG HIP UNILAT WITH PELVIS 2-3 VIEWS RIGHT (Completed)                   Final Clinical Impression(s) / ED Diagnoses Final diagnoses:  Closed fracture of right hip, initial encounter [facility])    Rx / [place] Orders ED Discharge Orders     None         Merryl Hacker, MD 03/01/21 [phone]

## 2021-03-01 NOTE — ED Notes (Signed)
Upon assessment pt O2 sats 90%. Pt placed on 2L [place] per order. O2 sats 98%. Pt placed in pt gown at this time.

## 2021-03-01 NOTE — ED Notes (Signed)
Pt is pulling at lines and very agitated at this time. Pt continually pulling off clothes and purwick and very restless. Hospitalist made aware to to await orders.

## 2021-03-01 NOTE — ED Notes (Signed)
Patient transported to CT 

## 2021-03-01 NOTE — ED Notes (Addendum)
Pt unable to sit up in order to take meds at this time. When attempting to reposition pt in bed he vocalized being in pain. Hospitalist notified.

## 2021-03-01 NOTE — ED Notes (Signed)
Encouraging pt to drink fluids at this time. Pt drank 37fl oz of nectar thickened cranberry juice.

## 2021-03-01 NOTE — ED Notes (Addendum)
Pt vocalizing pain. Pt repositioned, PRN pain meds in place, and warm blanket given.

## 2021-03-01 NOTE — ED Notes (Signed)
Pt legal guardian called and updated that pt has a bed at [facility].

## 2021-03-01 NOTE — ED Notes (Signed)
Patient back from CT.

## 2021-03-01 NOTE — ED Notes (Addendum)
Patient transported to X-ray 

## 2021-03-01 NOTE — ED Notes (Signed)
Patient back from  X-ray 

## 2021-03-01 NOTE — ED Notes (Signed)
X-ray at bedside

## 2021-03-01 NOTE — ED Notes (Signed)
Dietary called for nectar thickened liquids at this time.

## 2021-03-01 NOTE — ED Notes (Signed)
This nurse and another nurse attempted to move pt for suppository. Pt in extreme pain with any movement. Hospitalist notified. New orders placed.

## 2021-03-02 ENCOUNTER — Encounter (HOSPITAL_COMMUNITY)
Admission: EM | Disposition: A | Discharge status: Skilled Nursing Facility | Payer: Self-pay | Source: Skilled Nursing Facility | Attending: Student

## 2021-03-02 ENCOUNTER — Inpatient Hospital Stay (HOSPITAL_COMMUNITY): Payer: Medicare Other | Admitting: Certified Registered"

## 2021-03-02 ENCOUNTER — Inpatient Hospital Stay (HOSPITAL_COMMUNITY): Payer: Medicare Other

## 2021-03-02 ENCOUNTER — Encounter (HOSPITAL_COMMUNITY): Payer: Self-pay | Admitting: Internal Medicine

## 2021-03-02 ENCOUNTER — Other Ambulatory Visit: Payer: Self-pay

## 2021-03-02 DIAGNOSIS — E1165 Type 2 diabetes mellitus with hyperglycemia: Secondary | ICD-10-CM

## 2021-03-02 DIAGNOSIS — Z794 Long term (current) use of insulin: Secondary | ICD-10-CM

## 2021-03-02 DIAGNOSIS — J438 Other emphysema: Secondary | ICD-10-CM

## 2021-03-02 HISTORY — PX: ORIF FEMUR FRACTURE: SHX2119

## 2021-03-02 LAB — GLUCOSE, CAPILLARY
Glucose-Capillary: 92 mg/dL (ref 70–99)
Glucose-Capillary: 95 mg/dL (ref 70–99)
Glucose-Capillary: 107 mg/dL — ABNORMAL HIGH (ref 70–99)
Glucose-Capillary: 179 mg/dL — ABNORMAL HIGH (ref 70–99)

## 2021-03-02 LAB — COMPREHENSIVE METABOLIC PANEL
ALT: 20 U/L (ref 0–44)
AST: 16 U/L (ref 15–41)
Albumin: 3.1 g/dL — ABNORMAL LOW (ref 3.5–5.0)
Alkaline Phosphatase: 53 U/L (ref 38–126)
Anion gap: 9 (ref 5–15)
BUN: 20 mg/dL (ref 8–23)
CO2: 24 mmol/L (ref 22–32)
Calcium: 9.1 mg/dL (ref 8.9–10.3)
Chloride: 107 mmol/L (ref 98–111)
Creatinine, Ser: 1.14 mg/dL (ref 0.61–1.24)
GFR, Estimated: >60 mL/min (ref >60)
Glucose, Bld: 101 mg/dL — ABNORMAL HIGH (ref 70–99)
Potassium: 4 mmol/L (ref 3.5–5.1)
Sodium: 140 mmol/L (ref 135–145)
Total Bilirubin: 1.1 mg/dL (ref 0.3–1.2)
Total Protein: 6.3 g/dL — ABNORMAL LOW (ref 6.5–8.1)

## 2021-03-02 LAB — PROTIME-INR
INR: 1.2 (ref 0.8–1.2)
Prothrombin Time: 15.4 seconds — ABNORMAL HIGH (ref 11.4–15.2)

## 2021-03-02 LAB — VITAMIN D 25 HYDROXY (VIT D DEFICIENCY, FRACTURES): Vit D, 25-Hydroxy: 42.41 ng/mL (ref 30–100)

## 2021-03-02 LAB — CBC
HCT: 39.5 % (ref 39.0–52.0)
Hemoglobin: 12.9 g/dL — ABNORMAL LOW (ref 13.0–17.0)
MCH: 30 pg (ref 26.0–34.0)
MCHC: 32.7 g/dL (ref 30.0–36.0)
MCV: 91.9 fL (ref 80.0–100.0)
Platelets: 154 10*3/uL (ref 150–400)
RBC: 4.3 MIL/uL (ref 4.22–5.81)
RDW: 13.4 % (ref 11.5–15.5)
WBC: 7 10*3/uL (ref 4.0–10.5)
nRBC: 0 % (ref 0.0–0.2)

## 2021-03-02 LAB — HEMOGLOBIN A1C
Hgb A1c MFr Bld: 6.2 % — ABNORMAL HIGH (ref 4.8–5.6)
Mean Plasma Glucose: 131 mg/dL

## 2021-03-02 LAB — VALPROIC ACID LEVEL: Valproic Acid Lvl: <10 ug/mL — ABNORMAL LOW (ref 50.0–100.0)

## 2021-03-02 LAB — SURGICAL PCR SCREEN
MRSA, PCR: POSITIVE — AB
Staphylococcus aureus: POSITIVE — AB

## 2021-03-02 SURGERY — OPEN REDUCTION INTERNAL FIXATION FEMORAL SHAFT FRACTURE
Anesthesia: Monitor Anesthesia Care | Site: Leg Upper | Laterality: Right

## 2021-03-02 MED ORDER — PHENYLEPHRINE HCL-NACL 20-0.9 MG/250ML-% IV SOLN
INTRAVENOUS | Status: DC | PRN
  Administered 2021-03-02: 50 ug/min via INTRAVENOUS

## 2021-03-02 MED ORDER — FENTANYL CITRATE (PF) 100 MCG/2ML IJ SOLN: 25.0000 ug | INTRAMUSCULAR | Status: DC | PRN

## 2021-03-02 MED ORDER — PROPOFOL 500 MG/50ML IV EMUL
INTRAVENOUS | Status: DC | PRN
Start: 2021-03-02 — End: 2021-03-02
  Administered 2021-03-02: 100 ug/kg/min via INTRAVENOUS

## 2021-03-02 MED ORDER — ONDANSETRON HCL 4 MG/2ML IJ SOLN
INTRAMUSCULAR | Status: AC
  Filled 2021-03-02: qty 2

## 2021-03-02 MED ORDER — MELATONIN 3 MG PO TABS
6.0000 mg | ORAL_TABLET | Freq: Every day | ORAL | Status: DC
  Administered 2021-03-02 – 2021-03-05 (×4): 6 mg via ORAL
  Filled 2021-03-02 (×4): qty 2

## 2021-03-02 MED ORDER — LIDOCAINE HCL (CARDIAC) PF 100 MG/5ML IV SOSY
PREFILLED_SYRINGE | INTRAVENOUS | Status: DC | PRN
  Administered 2021-03-02: 100 mg via INTRATRACHEAL

## 2021-03-02 MED ORDER — DIVALPROEX SODIUM 250 MG PO DR TAB
375.0000 mg | DELAYED_RELEASE_TABLET | Freq: Two times a day (BID) | ORAL | Status: DC
  Administered 2021-03-02 – 2021-03-04 (×5): 375 mg via ORAL
  Filled 2021-03-02 (×7): qty 1

## 2021-03-02 MED ORDER — FENTANYL CITRATE (PF) 250 MCG/5ML IJ SOLN
INTRAMUSCULAR | Status: AC
  Filled 2021-03-02: qty 5

## 2021-03-02 MED ORDER — FENTANYL CITRATE (PF) 250 MCG/5ML IJ SOLN
INTRAMUSCULAR | Status: DC | PRN
  Administered 2021-03-02: 50 ug via INTRAVENOUS

## 2021-03-02 MED ORDER — MORPHINE SULFATE (PF) 2 MG/ML IV SOLN: 0.5000 mg | Freq: Once | INTRAVENOUS | Status: DC

## 2021-03-02 MED ORDER — BUPIVACAINE IN DEXTROSE 0.75-8.25 % IT SOLN
INTRATHECAL | Status: DC | PRN
  Administered 2021-03-02: 2 mL via INTRATHECAL

## 2021-03-02 MED ORDER — LIDOCAINE 2% (20 MG/ML) 5 ML SYRINGE
INTRAMUSCULAR | Status: AC
  Filled 2021-03-02: qty 5

## 2021-03-02 MED ORDER — ACETAMINOPHEN 10 MG/ML IV SOLN: 1000.0000 mg | Freq: Once | INTRAVENOUS | Status: DC | PRN

## 2021-03-02 MED ORDER — VANCOMYCIN HCL 1000 MG IV SOLR
INTRAVENOUS | Status: AC
  Filled 2021-03-02: qty 20

## 2021-03-02 MED ORDER — OLANZAPINE 5 MG PO TABS
5.0000 mg | ORAL_TABLET | Freq: Every day | ORAL | Status: DC
  Administered 2021-03-02 – 2021-03-04 (×3): 5 mg via ORAL
  Filled 2021-03-02 (×4): qty 1

## 2021-03-02 MED ORDER — CEFAZOLIN SODIUM-DEXTROSE 2-3 GM-%(50ML) IV SOLR
INTRAVENOUS | Status: DC | PRN
Start: 2021-03-02 — End: 2021-03-02
  Administered 2021-03-02: 2 g via INTRAVENOUS

## 2021-03-02 MED ORDER — PROPOFOL 10 MG/ML IV BOLUS
INTRAVENOUS | Status: AC
  Filled 2021-03-02: qty 20

## 2021-03-02 MED ORDER — DEXAMETHASONE SODIUM PHOSPHATE 10 MG/ML IJ SOLN
INTRAMUSCULAR | Status: AC
  Filled 2021-03-02: qty 1

## 2021-03-02 MED ORDER — 0.9 % SODIUM CHLORIDE (POUR BTL) OPTIME
TOPICAL | Status: DC | PRN
Start: 2021-03-02 — End: 2021-03-02
  Administered 2021-03-02: 1000 mL

## 2021-03-02 MED ORDER — DILTIAZEM HCL ER COATED BEADS 120 MG PO CP24
120.0000 mg | ORAL_CAPSULE | Freq: Every day | ORAL | Status: DC
  Administered 2021-03-03: 120 mg via ORAL
  Filled 2021-03-02: qty 1

## 2021-03-02 MED ORDER — CEFAZOLIN SODIUM-DEXTROSE 2-4 GM/100ML-% IV SOLN
2.0000 g | INTRAVENOUS | Status: DC
  Filled 2021-03-02: qty 100

## 2021-03-02 MED ORDER — ORAL CARE MOUTH RINSE: 15.0000 mL | Freq: Once | OROMUCOSAL | Status: DC

## 2021-03-02 MED ORDER — VANCOMYCIN HCL 1000 MG IV SOLR
INTRAVENOUS | Status: DC | PRN
  Administered 2021-03-02: 1000 mg via TOPICAL

## 2021-03-02 MED ORDER — CHLORHEXIDINE GLUCONATE 0.12 % MT SOLN: 15.0000 mL | Freq: Once | OROMUCOSAL | Status: DC

## 2021-03-02 MED ORDER — LACTATED RINGERS IV SOLN: INTRAVENOUS | Status: DC | PRN

## 2021-03-02 MED ORDER — LACTATED RINGERS IV SOLN: INTRAVENOUS | Status: DC

## 2021-03-02 SURGICAL SUPPLY — 61 items
ADH SKN CLS APL DERMABOND .7 (GAUZE/BANDAGES/DRESSINGS) ×2
APL PRP STRL LF DISP 70% ISPRP (MISCELLANEOUS) ×1
BAG COUNTER SPONGE SURGICOUNT (BAG) ×1 IMPLANT
BAG SPNG CNTER NS LX DISP (BAG)
BIT DRILL 4.3 (BIT) ×2
BIT DRILL 4.3X300MM (BIT) IMPLANT
BIT DRILL LONG 3.3 (BIT) ×1 IMPLANT
BIT DRILL QC 3.3X195 (BIT) ×1 IMPLANT
BRUSH SCRUB EZ PLAIN DRY (MISCELLANEOUS) ×3 IMPLANT
CAP LOCK NCB (Cap) ×8 IMPLANT
CHLORAPREP W/TINT 26 (MISCELLANEOUS) ×3 IMPLANT
COVER SURGICAL LIGHT HANDLE (MISCELLANEOUS) ×3 IMPLANT
DERMABOND ADVANCED (GAUZE/BANDAGES/DRESSINGS) ×2
DERMABOND ADVANCED .7 DNX12 (GAUZE/BANDAGES/DRESSINGS) IMPLANT
DRAPE C-ARM 42X72 X-RAY (DRAPES) ×2 IMPLANT
DRAPE C-ARMOR (DRAPES) ×2 IMPLANT
DRAPE IMP U-DRAPE 54X76 (DRAPES) ×2 IMPLANT
DRAPE U-SHAPE 47X51 STRL (DRAPES) ×2 IMPLANT
DRSG ADAPTIC 3X8 NADH LF (GAUZE/BANDAGES/DRESSINGS) ×1 IMPLANT
DRSG MEPILEX BORDER 4X8 (GAUZE/BANDAGES/DRESSINGS) ×2 IMPLANT
DRSG PAD ABDOMINAL 8X10 ST (GAUZE/BANDAGES/DRESSINGS) ×4 IMPLANT
ELECT REM PT RETURN 9FT ADLT (ELECTROSURGICAL) ×2
ELECTRODE REM PT RTRN 9FT ADLT (ELECTROSURGICAL) ×1 IMPLANT
EVACUATOR 1/8 PVC DRAIN (DRAIN) IMPLANT
GLOVE SURG ENC MOIS LTX SZ6.5 (GLOVE) ×6 IMPLANT
GLOVE SURG ENC MOIS LTX SZ7.5 (GLOVE) ×8 IMPLANT
GLOVE SURG UNDER POLY LF SZ6.5 (GLOVE) ×2 IMPLANT
GLOVE SURG UNDER POLY LF SZ7.5 (GLOVE) ×2 IMPLANT
GOWN STRL REUS W/ TWL LRG LVL3 (GOWN DISPOSABLE) ×2 IMPLANT
GOWN STRL REUS W/TWL LRG LVL3 (GOWN DISPOSABLE) ×4
K-WIRE 2.0 (WIRE) ×4
K-WIRE FXSTD 280X2XNS SS (WIRE) ×2
KIT BASIN OR (CUSTOM PROCEDURE TRAY) ×2 IMPLANT
KIT TURNOVER KIT B (KITS) ×2 IMPLANT
KWIRE FXSTD 280X2XNS SS (WIRE) IMPLANT
MANIFOLD NEPTUNE II (INSTRUMENTS) ×2 IMPLANT
NS IRRIG 1000ML POUR BTL (IV SOLUTION) ×2 IMPLANT
PACK TOTAL JOINT (CUSTOM PROCEDURE TRAY) ×2 IMPLANT
PAD ARMBOARD 7.5X6 YLW CONV (MISCELLANEOUS) ×4 IMPLANT
PLATE PROXIMAL FEMUR 12H RT (Plate) ×1 IMPLANT
SCREW CORT NCB SELFTAP 5.0X42 (Screw) ×3 IMPLANT
SCREW HUM NCB PA ST 4X60 (Screw) ×1 IMPLANT
SCREW NCB 4.0MX42M (Screw) ×1 IMPLANT
SCREW NCB 4.0MX46M (Screw) ×1 IMPLANT
SCREW NCB 4.0MX50M (Screw) ×2 IMPLANT
SCREW NCB 4.0MX55M (Screw) ×3 IMPLANT
SPONGE T-LAP 18X18 ~~LOC~~+RFID (SPONGE) ×3 IMPLANT
STAPLER VISISTAT 35W (STAPLE) ×1 IMPLANT
SUT MNCRL AB 3-0 PS2 18 (SUTURE) ×3 IMPLANT
SUT MON AB 2-0 CT1 36 (SUTURE) ×1 IMPLANT
SUT PROLENE 0 CT (SUTURE) IMPLANT
SUT VIC AB 0 CT1 27 (SUTURE) ×2
SUT VIC AB 0 CT1 27XBRD ANBCTR (SUTURE) ×1 IMPLANT
SUT VIC AB 1 CT1 27 (SUTURE)
SUT VIC AB 1 CT1 27XBRD ANTBC (SUTURE) ×4 IMPLANT
SUT VIC AB 2-0 CT1 27 (SUTURE) ×2
SUT VIC AB 2-0 CT1 TAPERPNT 27 (SUTURE) ×1 IMPLANT
TOWEL GREEN STERILE (TOWEL DISPOSABLE) ×4 IMPLANT
TOWEL GREEN STERILE FF (TOWEL DISPOSABLE) ×2 IMPLANT
TRAY FOLEY MTR SLVR 16FR STAT (SET/KITS/TRAYS/PACK) IMPLANT
WATER STERILE IRR 1000ML POUR (IV SOLUTION) ×3 IMPLANT

## 2021-03-02 NOTE — TOC CAGE-AID Note (Signed)
Transition of Care [facility]) - CAGE-AID Screening   Patient Details  Name: Thomas [name] MRN: [id number] Date of Birth: August 22, 1927  Transition of Care [facility]) CM/SW Contact:    Constantine Ruddick C Tarpley-Carter, [place] Phone Number: 03/02/2021, 8:33 AM   Clinical Narrative: Pt is unable to participate in Cage Aid. Pt is not appropriate for assessment.  Deserie Dirks Tarpley-Carter, MSW, LCSW-A Pronouns:  She/Her/Hers [place] Transitions of Care Clinical Social Worker Direct Number:  [phone] [email or web address]  CAGE-AID Screening: Substance Abuse Screening unable to be completed due to: : Patient unable to participate             Substance Abuse Education Offered: No

## 2021-03-02 NOTE — Transfer of Care (Signed)
Immediate Anesthesia Transfer of Care Note  Patient: Thomas [name]  Procedure(s) Performed: OPEN REDUCTION INTERNAL FIXATION RIGHT PERIPROSTHETIC PROXIMAL FEMUR (Right: Leg Upper)  Patient Location: PACU  Anesthesia Type:MAC and Spinal  Level of Consciousness: drowsy, patient cooperative and responds to stimulation  Airway & Oxygen Therapy: Patient Spontanous Breathing  Post-op Assessment: Report given to RN and Post -op Vital signs reviewed and stable  Post vital signs: Reviewed and stable  Last Vitals:  Vitals Value Taken Time  BP 99/57 03/02/21 1615  Temp    Pulse 95 03/02/21 1617  Resp 18 03/02/21 1621  SpO2 88 % 03/02/21 1617  Vitals shown include unvalidated device data.  Last Pain:  Vitals:   03/02/21 1325  TempSrc: Oral         Complications: No notable events documented.

## 2021-03-02 NOTE — Plan of Care (Signed)

## 2021-03-02 NOTE — Progress Notes (Signed)
PROGRESS NOTE    [name]  RCB:[id number] DOB: 04-22-1927 DOA: 02/28/2021 PCP: Hilbert Corrigan, MD    Brief Narrative:  LAWERENCE [name] is a 88 year old male with past medical history significant for type 2 diabetes mellitus, dementia, dyslipidemia, lung cancer, prostate cancer who presented to [facility], ED via EMS following a fall sustained at his skilled nursing facility.  Patient unable to provide history, likely secondary to underlying dementia and history is obtained from ED physician and medical record.  Per report, patient with unwitnessed fall at SNF and EMS was activated with patient complaining of right hip and back pain.  In the ED, patient was hemodynamically stable.  COVID-19/influenza A/B PCR negative.  CT right hip without contrast with acute nondisplaced oblique fracture of the proximal femoral diaphysis below the level of the femoral component of the hip arthroplasty.  CT L-spine without contrast with no acute fracture or traumatic listhesis.  Right knee and pelvics x-ray with no evidence of fracture.  Patient was given IV fentanyl, lorazepam.  Orthopedics consulted who requested transfer to [facility] for operative management.   Assessment & Plan:   Principal Problem:   Right femoral fracture (HCC) Active Problems:   COPD (chronic obstructive pulmonary disease) (HCC)   Type 2 diabetes mellitus with hyperglycemia, with long-term current use of insulin (HCC)   Essential hypertension   Anemia  Right femoral fracture Patient presenting following fall that was unwitnessed at SNF.  CT right hip with acute nondisplaced oblique fracture of the proximal femoral diaphysis below the level of the femoral component of the hip arthroplasty.  Patient was transferred to [facility] for orthopedic evaluation and intervention. --Orthopedics following, appreciate assistance --N.p.o. --NS at 50 mL/h --Fentanyl 25 mcg IV q2h PRN moderate/severe pain --Plan operative  management with ORIF today with Dr. Doreatha Martin --PT/OT evaluation postoperatively  Essential hypertension --Diltiazem 120 mg p.o. daily. --Holding home Imdur 60 mg p.o. daily for now --Continue monitor BP closely  Type 2 diabetes mellitus Hemoglobin A1c 6.2, well controlled.  Diet controlled at home. --SSI for coverage --CBGs q4h while NPO  Dyslipidemia: Atorvastatin 40 mg p.o. daily  COPD: --Ruthe Mannan, Spiriva --DuoNeb as needed  Dementia --Delirium precautions --Get up during the day --Encourage a familiar face to remain present throughout the day --Keep blinds open and lights on during daylight hours --Minimize the use of opioids/benzodiazepines if able --Depakote 375 mg p.o. twice daily --Zyprexa 5 mg p.o. nightly --Melatonin 6 mg p.o. nightly --Check Depakote level  DVT prophylaxis: SCDs Start: 03/01/21 0650   Code Status: DNR Family Communication: No family present at bedside this morning  Disposition Plan:  Level of care: Med-Surg Status is: Inpatient  Remains inpatient appropriate because: Pending operative management for right femur fracture.  Anticipate discharge back to [facility] in 2-3 days postoperatively following therapy evaluation.    Consultants:  Orthopedics, Dr. Doreatha Martin  Procedures:  Pending ORIF right femur fracture 1/27  Antimicrobials:  None   Subjective: Patient seen examined at bedside, resting comfortably.  Pleasantly confused.  No family present.  Orthopedics plans operative management for right femur fracture today.  Patient with no complaints, but difficulty with ROS given underlying dementia.  No acute concerns overnight per nurse staff.  Objective: Vitals:   03/02/21 0643 03/02/21 0729 03/02/21 0730 03/02/21 0839  BP: 121/65   140/71  Pulse: 87   80  Resp:    18  Temp: 98.2 F (36.8 C)   98.5 F (36.9 C)  TempSrc:  Oral   Oral  SpO2: 96% 94% 94% 97%  Weight:      Height:        Intake/Output Summary (Last 24 hours) at  03/02/2021 1033 Last data filed at 03/02/2021 0430 Gross per 24 hour  Intake 468.7 ml  Output 700 ml  Net -231.3 ml   Filed Weights   02/28/21 2353  Weight: 70.3 kg    Examination:  General exam: Appears calm and comfortable, pleasantly confused, elderly in appearance  Respiratory system: Clear to auscultation. Respiratory effort normal.  On room air Cardiovascular system: S1 & S2 heard, RRR. No JVD, murmurs, rubs, gallops or clicks. No pedal edema. Gastrointestinal system: Abdomen is nondistended, soft and nontender. No organomegaly or masses felt. Normal bowel sounds heard. Central nervous system: Alert and oriented. No focal neurological deficits. Extremities: Right lower extremity shortened with external rotation, neurovascular intact Skin: No rashes, lesions or ulcers Psychiatry: Judgement and insight appear poor. Mood & affect appropriate.     Data Reviewed: I have personally reviewed following labs and imaging studies  CBC: Recent Labs  Lab 03/01/21 0349 03/02/21 0926  WBC 8.1 7.0  NEUTROABS 5.3  --   HGB 13.3 12.9*  HCT 40.5 39.5  MCV 95.1 91.9  PLT 164 381   Basic Metabolic Panel: Recent Labs  Lab 03/01/21 0349  NA 138  K 3.9  CL 109  CO2 22  GLUCOSE 100*  BUN 25*  CREATININE 1.13  CALCIUM 8.8*   GFR: Estimated Creatinine Clearance: 42.3 mL/min (by C-G formula based on SCr of 1.13 mg/dL). Liver Function Tests: No results for input(s): AST, ALT, ALKPHOS, BILITOT, PROT, ALBUMIN in the last 168 hours. No results for input(s): LIPASE, AMYLASE in the last 168 hours. No results for input(s): AMMONIA in the last 168 hours. Coagulation Profile: Recent Labs  Lab 03/02/21 0926  INR 1.2   Cardiac Enzymes: No results for input(s): CKTOTAL, CKMB, CKMBINDEX, TROPONINI in the last 168 hours. BNP (last 3 results) No results for input(s): PROBNP in the last 8760 hours. HbA1C: Recent Labs    03/01/21 0349  HGBA1C 6.2*   CBG: Recent Labs  Lab  03/01/21 1025 03/01/21 1152 03/01/21 1659 03/01/21 2337 03/02/21 0839  GLUCAP 112* 119* 122* 108* 92   Lipid Profile: No results for input(s): CHOL, HDL, LDLCALC, TRIG, CHOLHDL, LDLDIRECT in the last 72 hours. Thyroid Function Tests: No results for input(s): TSH, T4TOTAL, FREET4, T3FREE, THYROIDAB in the last 72 hours. Anemia Panel: No results for input(s): VITAMINB12, FOLATE, FERRITIN, TIBC, IRON, RETICCTPCT in the last 72 hours. Sepsis Labs: No results for input(s): PROCALCITON, LATICACIDVEN in the last 168 hours.  Recent Results (from the past 240 hour(s))  Resp Panel by RT-PCR (Flu A&B, Covid) Nasopharyngeal Swab     Status: None   Collection Time: 03/01/21  2:34 AM   Specimen: Nasopharyngeal Swab; Nasopharyngeal(NP) swabs in vial transport medium  Result Value Ref Range Status   SARS Coronavirus 2 by RT PCR NEGATIVE NEGATIVE Final    Comment: (NOTE) SARS-CoV-2 target nucleic acids are NOT DETECTED.  The SARS-CoV-2 RNA is generally detectable in upper respiratory specimens during the acute phase of infection. The lowest concentration of SARS-CoV-2 viral copies this assay can detect is 138 copies/mL. A negative result does not preclude SARS-Cov-2 infection and should not be used as the sole basis for treatment or other patient management decisions. A negative result may occur with  improper specimen collection/handling, submission of specimen other than nasopharyngeal swab, presence of viral  mutation(s) within the areas targeted by this assay, and inadequate number of viral copies(<138 copies/mL). A negative result must be combined with clinical observations, patient history, and epidemiological information. The expected result is Negative.  Fact Sheet for Patients:  EntrepreneurPulse.com.au  Fact Sheet for Healthcare Providers:  IncredibleEmployment.be  This test is no t yet approved or cleared by the [place] FDA and  has been  authorized for detection and/or diagnosis of SARS-CoV-2 by FDA under an Emergency Use Authorization (EUA). This EUA will remain  in effect (meaning this test can be used) for the duration of the COVID-19 declaration under Section 564(b)(1) of the Act, 21 U.S.C.section 360bbb-3(b)(1), unless the authorization is terminated  or revoked sooner.       Influenza A by PCR NEGATIVE NEGATIVE Final   Influenza B by PCR NEGATIVE NEGATIVE Final    Comment: (NOTE) The Xpert Xpress SARS-CoV-2/FLU/RSV plus assay is intended as an aid in the diagnosis of influenza from Nasopharyngeal swab specimens and should not be used as a sole basis for treatment. Nasal washings and aspirates are unacceptable for Xpert Xpress SARS-CoV-2/FLU/RSV testing.  Fact Sheet for Patients: EntrepreneurPulse.com.au  Fact Sheet for Healthcare Providers: IncredibleEmployment.be  This test is not yet approved or cleared by the [place] FDA and has been authorized for detection and/or diagnosis of SARS-CoV-2 by FDA under an Emergency Use Authorization (EUA). This EUA will remain in effect (meaning this test can be used) for the duration of the COVID-19 declaration under Section 564(b)(1) of the Act, 21 U.S.C. section 360bbb-3(b)(1), unless the authorization is terminated or revoked.  Performed at [facility], [address] [place], [place] [address]          Radiology Studies: CT Lumbar Spine Wo Contrast  Result Date: 03/01/2021 CLINICAL DATA:  Back trauma, unwitnessed fall EXAM: CT LUMBAR SPINE WITHOUT CONTRAST TECHNIQUE: Multidetector CT imaging of the lumbar spine was performed without intravenous contrast administration. Multiplanar CT image reconstructions were also generated. RADIATION DOSE REDUCTION: This exam was performed according to the departmental dose-optimization program which includes automated exposure control, adjustment of the mA and/or kV according to  patient size and/or use of iterative reconstruction technique. COMPARISON:  No prior lumbar spine CT, correlation is made with CT abdomen pelvis 07/10/2016 and lumbar spine MRI 03/17/2009. FINDINGS: Segmentation: 5 lumbar type vertebrae. Alignment: Unchanged mild retrolisthesis L5 on S1, unchanged. No acute listhesis. Mild straightening of the normal lumbar lordosis. Vertebrae: No acute fracture or focal pathologic lesion. Redemonstrated compression deformity of L1, status post kyphoplasty, which appears unchanged compared to 2018. Osseous fusion across L5-S1. Degenerative osseous fusion across the left sacroiliac joint. Paraspinal and other soft tissues: Aortic atherosclerosis. Disc levels: No significant degenerative changes at T12-L4. L4-L5: Broad-based disc bulge. Mild-to-moderate facet arthropathy. Ligamentum flavum hypertrophy. No definite spinal canal stenosis. Mild bilateral neural foraminal narrowing. L5-S1: Osseous fusion with fixed retrolisthesis. Moderate facet arthropathy. No spinal canal stenosis. Mild left-greater-than-right neural foraminal narrowing. IMPRESSION: 1. No acute fracture or traumatic listhesis. 2. Unchanged appearance of L1 compression deformity, status post kyphoplasty. 3. Degenerative changes in the lower lumbar spine and sacrum, with mild neural foraminal narrowing at L4-L5 and L5-S1. Electronically Signed   By: Merilyn Baba M.D.   On: 03/01/2021 02:01   CT Hip Right Wo Contrast  Result Date: 03/01/2021 CLINICAL DATA:  Trauma. EXAM: CT OF THE RIGHT HIP WITHOUT CONTRAST TECHNIQUE: Multidetector CT imaging of the right hip was performed according to the standard protocol. Multiplanar CT image reconstructions were also generated.  RADIATION DOSE REDUCTION: This exam was performed according to the departmental dose-optimization program which includes automated exposure control, adjustment of the mA and/or kV according to patient size and/or use of iterative reconstruction technique.  COMPARISON:  Right hip x-ray 03/01/2021. CT abdomen and pelvis 07/10/2016. FINDINGS: Bones/Joint/Cartilage There is a right hip arthroplasty in anatomic alignment. There is no evidence for hardware loosening. Or proximal portion of fracture is near the cerclage wire and distal aspect of the fracture is 2.5 cm distal to the tip of the femoral component of the arthroplasty. There is a healed right inferior pubic ramus fracture. No dislocation. Ligaments Suboptimally assessed by CT. Muscles and Tendons Grossly within normal limits. Limited evaluation secondary to streak artifact. Soft tissues Grossly within normal limits. Limited evaluation secondary to streak artifact. IMPRESSION: 1. Acute nondisplaced oblique fracture of the proximal femoral diaphysis at and below the level of the femoral component of the hip arthroplasty. 2. Right hip arthroplasty in anatomic alignment. Electronically Signed   By: Ronney Asters M.D.   On: 03/01/2021 02:01   DG HIP UNILAT WITH PELVIS 2-3 VIEWS RIGHT  Result Date: 03/01/2021 CLINICAL DATA:  Unwitnessed fall.  Right hip and back pain. EXAM: DG HIP (WITH OR WITHOUT PELVIS) 2-3V RIGHT COMPARISON:  Similar study 10/04/2016. FINDINGS: Generalized osteopenia. Right hip arthroplasty is again noted without evidence of significant loosening, dislocation of the prosthesis, or perihardware fractures. On the left, an old hip nailing is again noted and there is increased flattening of the superior femoral head consistent with avascular necrosis with moderate secondary DJD also increased. There are numerous surgical clips along the pelvic sidewalls. Ankylosis of portions of both SI joints. Pelvic enthesopathy is again shown as well.  Penile implant. IMPRESSION: 1. Osteopenia, degenerative and postsurgical change. 2. No evidence of fractures. 3. Increased superior left femoral head flattening consistent with interval avascular necrosis with articular surface subsidence, moderate secondary DJD.  Electronically Signed   By: Telford Nab M.D.   On: 03/01/2021 00:49   DG FEMUR PORT, MIN 2 VIEWS RIGHT  Result Date: 03/01/2021 CLINICAL DATA:  Fall, RIGHT hip fracture EXAM: RIGHT FEMUR PORTABLE 2 VIEW COMPARISON:  None. FINDINGS: RIGHT hip prosthetic located. No fracture of the proximal femur. No fracture of the distal femur. Knee joint intact on one view. IMPRESSION: No fracture or dislocation. Electronically Signed   By: Suzy Bouchard M.D.   On: 03/01/2021 08:22        Scheduled Meds:  atorvastatin  40 mg Oral Daily   bisacodyl  5 mg Oral Daily   diltiazem  240 mg Oral Daily   donepezil  5 mg Oral QHS   feeding supplement  237 mL Oral BID WC   ferrous sulfate  325 mg Oral BID WC   insulin aspart  0-5 Units Subcutaneous QHS   insulin aspart  0-9 Units Subcutaneous TID WC   insulin glargine-yfgn  6 Units Subcutaneous QHS   mometasone-formoterol  2 puff Inhalation BID   pantoprazole  40 mg Oral BID   polyethylene glycol  17 g Oral Daily   umeclidinium bromide  1 puff Inhalation Daily   Continuous Infusions:  sodium chloride 50 mL/hr at 03/01/21 2116     LOS: 1 day    Time spent: 46 minutes spent on chart review, discussion with nursing staff, consultants, updating family and interview/physical exam; more than 50% of that time was spent in counseling and/or coordination of care.    Karlisha Mathena J [place] DO [facility] Available via  Epic secure chat 7am-7pm After these hours, please refer to coverage provider listed on [email or web address] 03/02/2021, 10:33 AM

## 2021-03-02 NOTE — Anesthesia Procedure Notes (Signed)
Procedure Name: MAC Date/Time: 03/02/2021 2:40 PM Performed by: Claris Che, CRNA Pre-anesthesia Checklist: Patient identified, Emergency Drugs available, Suction available, Patient being monitored and Timeout performed Patient Re-evaluated:Patient Re-evaluated prior to induction Oxygen Delivery Method: Simple face mask Dental Injury: Teeth and Oropharynx as per pre-operative assessment

## 2021-03-02 NOTE — Consult Note (Signed)
Orthopaedic Trauma Service (OTS) Consult   Patient ID: Thomas [name] MRN: [id number] DOB/AGE: [birth year] 88 y.o.   Reason for Consult: Right periprosthetic proximal femur fracture Referring Physician: Thayer Jew, MD (EDP at [facility])   HPI: Thomas [name] is an 88 y.o. male with medical history notable for prostate cancer, diabetes, advancing dementia who resides at [facility] and sustained a fall at the facility.  The fall was unwitnessed however he was complaining of right hip pain.  He was initially brought to [facility] where he was evaluated.  He was found to have a right proximal femur periprosthetic hip fracture.  Fortunately his total hip components appear to be stable.  Due to the location of the injury as well as it being periprosthetic fracture request was for orthopedic trauma service to evaluate the patient.  Patient was transferred from [facility] last night.  He is a ward of the state and has a legal guardian through [place].  I did discussed this with his legal guardian, Thomas [name].  Legal guardian states that he is still somewhat ambulatory however has had decreased capacity to ambulate over the last several months.  He is still impulsive with getting up and doing things on his own.  He is not bedbound or wheelchair-bound completely at this point.  Patient seen and evaluated in room 5 N. 6 he is really not all that engage in the exam.  He is initially sleeping on my entry does wake up a little bit and answer some questions though again not really participatory.  Again case was discussed with his legal guardian.  They will need to communicate this up the chain of command and discussed with the legal department to see if surgery is something they wish to pursue.  Given his clinical findings as well as his reported baseline function I do think it would be reasonable to proceed with surgery for pain control and to get him  back to some baseline function.  We did discuss complications associated with surgery given his age as it relates to recovery, deconditioning with associated complications related to that as well as the anesthetic complications.  Past Medical History:  Diagnosis Date   Anginal pain ([place])    Cancer ([place])    Diabetes mellitus    Hyperlipemia    Lung cancer ([place])    Prostate cancer [facility])     Past Surgical History:  Procedure Laterality Date   BACK SURGERY     CARDIAC SURGERY     ESOPHAGOGASTRODUODENOSCOPY N/A 07/11/2016   Procedure: ESOPHAGOGASTRODUODENOSCOPY (EGD);  Surgeon: Rogene Houston, MD;  [facility];  Service: Endoscopy;  Laterality: N/A;   HIP ARTHROPLASTY Right 10/03/2016   Procedure: ARTHROPLASTY BIPOLAR HIP (HEMIARTHROPLASTY) with cabeling;  Surgeon: Renette Butters, MD;  Location: [place];  Service: Orthopedics;  Laterality: Right;   INTRAMEDULLARY (IM) NAIL INTERTROCHANTERIC Left 05/04/2016   Procedure: INTRAMEDULLARY (IM) NAIL INTERTROCHANTRIC;  Surgeon: Renette Butters, MD;  Location: [place];  Service: Orthopedics;  Laterality: Left;   PROSTATE SURGERY     surgery to repair aneurysm     TUMOR REMOVAL     from lung    History reviewed. No pertinent family history.  Social History:  reports that he quit smoking about 27 years ago. His smoking use included cigarettes. He has a 50.00 pack-year smoking history. He has quit using smokeless tobacco.  His smokeless tobacco use included  chew. He reports that he does not drink alcohol and does not use drugs.  Allergies:  Allergies  Allergen Reactions   Nubain [Nalbuphine Hcl]     Listed on MAR-no reaction is noted    Medications: I have reviewed the patient's current medications. Current Meds  Medication Sig   acetaminophen (TYLENOL) 500 MG tablet Take 1 tablet (500 mg total) by mouth every 8 (eight) hours as needed for moderate pain. (Patient taking differently: Take 500 mg by mouth in the morning and at  bedtime.)   budesonide-formoterol (SYMBICORT) 160-4.5 MCG/ACT inhaler Inhale 2 puffs into the lungs 2 (two) times daily.   diltiazem (CARDIZEM CD) 120 MG 24 hr capsule Take 120 mg by mouth daily.   divalproex (DEPAKOTE) 125 MG DR tablet Take 375 mg by mouth 2 (two) times daily.   isosorbide mononitrate (IMDUR) 60 MG 24 hr tablet Take 60 mg by mouth daily.   lidocaine (LIDODERM) 5 % Place 1 patch onto the skin daily. Apply to lower back. Remove & Discard patch within 12 hours or as directed by MD   MELATONIN PO Take 6 mg by mouth at bedtime.   multivitamin-lutein (OCUVITE-LUTEIN) CAPS capsule Take 1 capsule by mouth in the morning and at bedtime.   nitroGLYCERIN (NITROSTAT) 0.4 MG SL tablet Place 0.4 mg under the tongue every 5 (five) minutes x 3 doses as needed for chest pain.   OLANZapine (ZYPREXA) 5 MG tablet Take 5 mg by mouth at bedtime.   senna (SENOKOT) 8.6 MG TABS tablet Take 1 tablet (8.6 mg total) by mouth 2 (two) times daily as needed for mild constipation. (Patient taking differently: Take 1 tablet by mouth in the morning and at bedtime.)   umeclidinium bromide (INCRUSE ELLIPTA) 62.5 MCG/ACT AEPB Inhale 1 puff into the lungs daily.   Vitamin D, Ergocalciferol, (DRISDOL) 50000 units CAPS capsule Take 50,000 Units by mouth every 30 (thirty) days. 27th of each month     Results for orders placed or performed during the hospital encounter of 02/28/21 (from the past 48 hour(s))  Resp Panel by RT-PCR (Flu A&B, Covid) Nasopharyngeal Swab     Status: None   Collection Time: 03/01/21  2:34 AM   Specimen: Nasopharyngeal Swab; Nasopharyngeal(NP) swabs in vial transport medium  Result Value Ref Range   SARS Coronavirus 2 by RT PCR NEGATIVE NEGATIVE    Comment: (NOTE) SARS-CoV-2 target nucleic acids are NOT DETECTED.  The SARS-CoV-2 RNA is generally detectable in upper respiratory specimens during the acute phase of infection. The lowest concentration of SARS-CoV-2 viral copies this assay  can detect is 138 copies/mL. A negative result does not preclude SARS-Cov-2 infection and should not be used as the sole basis for treatment or other patient management decisions. A negative result may occur with  improper specimen collection/handling, submission of specimen other than nasopharyngeal swab, presence of viral mutation(s) within the areas targeted by this assay, and inadequate number of viral copies(<138 copies/mL). A negative result must be combined with clinical observations, patient history, and epidemiological information. The expected result is Negative.  Fact Sheet for Patients:  EntrepreneurPulse.com.au  Fact Sheet for Healthcare Providers:  IncredibleEmployment.be  This test is no t yet approved or cleared by the [place] FDA and  has been authorized for detection and/or diagnosis of SARS-CoV-2 by FDA under an Emergency Use Authorization (EUA). This EUA will remain  in effect (meaning this test can be used) for the duration of the COVID-19 declaration under Section 564(b)(1) of the Act,  21 U.S.C.section 360bbb-3(b)(1), unless the authorization is terminated  or revoked sooner.       Influenza A by PCR NEGATIVE NEGATIVE   Influenza B by PCR NEGATIVE NEGATIVE    Comment: (NOTE) The Xpert Xpress SARS-CoV-2/FLU/RSV plus assay is intended as an aid in the diagnosis of influenza from Nasopharyngeal swab specimens and should not be used as a sole basis for treatment. Nasal washings and aspirates are unacceptable for Xpert Xpress SARS-CoV-2/FLU/RSV testing.  Fact Sheet for Patients: EntrepreneurPulse.com.au  Fact Sheet for Healthcare Providers: IncredibleEmployment.be  This test is not yet approved or cleared by the [place] FDA and has been authorized for detection and/or diagnosis of SARS-CoV-2 by FDA under an Emergency Use Authorization (EUA). This EUA will remain in effect  (meaning this test can be used) for the duration of the COVID-19 declaration under Section 564(b)(1) of the Act, 21 U.S.C. section 360bbb-3(b)(1), unless the authorization is terminated or revoked.  Performed at [facility], [address] [place], [place] [address]   CBC with Differential     Status: Abnormal   Collection Time: 03/01/21  3:49 AM  Result Value Ref Range   WBC 8.1 4.0 - 10.5 K/uL   RBC 4.26 4.22 - 5.81 MIL/uL   Hemoglobin 13.3 13.0 - 17.0 g/dL   HCT 40.5 39.0 - 52.0 %   MCV 95.1 80.0 - 100.0 fL   MCH 31.2 26.0 - 34.0 pg   MCHC 32.8 30.0 - 36.0 g/dL   RDW 13.7 11.5 - 15.5 %   Platelets 164 150 - 400 K/uL   nRBC 0.0 0.0 - 0.2 %   Neutrophils Relative % 64 %   Neutro Abs 5.3 1.7 - 7.7 K/uL   Lymphocytes Relative 18 %   Lymphs Abs 1.5 0.7 - 4.0 K/uL   Monocytes Relative 14 %   Monocytes Absolute 1.1 (H) 0.1 - 1.0 K/uL   Eosinophils Relative 3 %   Eosinophils Absolute 0.2 0.0 - 0.5 K/uL   Basophils Relative 1 %   Basophils Absolute 0.1 0.0 - 0.1 K/uL   Immature Granulocytes 0 %   Abs Immature Granulocytes 0.02 0.00 - 0.07 K/uL    Comment: Performed at [facility], [address], [place], [place] [address]  Basic metabolic panel     Status: Abnormal   Collection Time: 03/01/21  3:49 AM  Result Value Ref Range   Sodium 138 135 - 145 mmol/L   Potassium 3.9 3.5 - 5.1 mmol/L   Chloride 109 98 - 111 mmol/L   CO2 22 22 - 32 mmol/L   Glucose, Bld 100 (H) 70 - 99 mg/dL    Comment: Glucose reference range applies only to samples taken after fasting for at least 8 hours.   BUN 25 (H) 8 - 23 mg/dL   Creatinine, Ser 1.13 0.61 - 1.24 mg/dL   Calcium 8.8 (L) 8.9 - 10.3 mg/dL   GFR, Estimated >60 >60 mL/min    Comment: (NOTE) Calculated using the CKD-EPI Creatinine Equation (2021)    Anion gap 7 5 - 15    Comment: Performed at [facility], [address] [place], [place] [address]  Hemoglobin A1c     Status: Abnormal   Collection Time: 03/01/21  3:49 AM  Result  Value Ref Range   Hgb A1c MFr Bld 6.2 (H) 4.8 - 5.6 %    Comment: (NOTE)         Prediabetes: 5.7 - 6.4         Diabetes: >  6.4         Glycemic control for adults with diabetes: <7.0    Mean Plasma Glucose 131 mg/dL    Comment: (NOTE) Performed At: [facility] [place], [place] [id number] Rush Farmer MD DP:[id number]   CBG monitoring, ED     Status: Abnormal   Collection Time: 03/01/21 10:25 AM  Result Value Ref Range   Glucose-Capillary 112 (H) 70 - 99 mg/dL    Comment: Glucose reference range applies only to samples taken after fasting for at least 8 hours.  CBG monitoring, ED     Status: Abnormal   Collection Time: 03/01/21 11:52 AM  Result Value Ref Range   Glucose-Capillary 119 (H) 70 - 99 mg/dL    Comment: Glucose reference range applies only to samples taken after fasting for at least 8 hours.  CBG monitoring, ED     Status: Abnormal   Collection Time: 03/01/21  4:59 PM  Result Value Ref Range   Glucose-Capillary 122 (H) 70 - 99 mg/dL    Comment: Glucose reference range applies only to samples taken after fasting for at least 8 hours.  Glucose, capillary     Status: Abnormal   Collection Time: 03/01/21 11:37 PM  Result Value Ref Range   Glucose-Capillary 108 (H) 70 - 99 mg/dL    Comment: Glucose reference range applies only to samples taken after fasting for at least 8 hours.  Glucose, capillary     Status: None   Collection Time: 03/02/21  8:39 AM  Result Value Ref Range   Glucose-Capillary 92 70 - 99 mg/dL    Comment: Glucose reference range applies only to samples taken after fasting for at least 8 hours.  CBC     Status: Abnormal   Collection Time: 03/02/21  9:26 AM  Result Value Ref Range   WBC 7.0 4.0 - 10.5 K/uL   RBC 4.30 4.22 - 5.81 MIL/uL   Hemoglobin 12.9 (L) 13.0 - 17.0 g/dL   HCT 39.5 39.0 - 52.0 %   MCV 91.9 80.0 - 100.0 fL   MCH 30.0 26.0 - 34.0 pg   MCHC 32.7 30.0 - 36.0 g/dL   RDW 13.4 11.5 - 15.5 %   Platelets 154 150  - 400 K/uL   nRBC 0.0 0.0 - 0.2 %    Comment: Performed at [facility], [place] [address] [place], [place] [address]  Comprehensive metabolic panel     Status: Abnormal   Collection Time: 03/02/21  9:26 AM  Result Value Ref Range   Sodium 140 135 - 145 mmol/L   Potassium 4.0 3.5 - 5.1 mmol/L   Chloride 107 98 - 111 mmol/L   CO2 24 22 - 32 mmol/L   Glucose, Bld 101 (H) 70 - 99 mg/dL    Comment: Glucose reference range applies only to samples taken after fasting for at least 8 hours.   BUN 20 8 - 23 mg/dL   Creatinine, Ser 1.14 0.61 - 1.24 mg/dL   Calcium 9.1 8.9 - 10.3 mg/dL   Total Protein 6.3 (L) 6.5 - 8.1 g/dL   Albumin 3.1 (L) 3.5 - 5.0 g/dL   AST 16 15 - 41 U/L   ALT 20 0 - 44 U/L   Alkaline Phosphatase 53 38 - 126 U/L   Total Bilirubin 1.1 0.3 - 1.2 mg/dL   GFR, Estimated >60 >60 mL/min    Comment: (NOTE) Calculated using the CKD-EPI Creatinine Equation (2021)    Anion gap  9 5 - 15    Comment: Performed at [facility], [place] [address] [place], [place] [address]  Protime-INR     Status: Abnormal   Collection Time: 03/02/21  9:26 AM  Result Value Ref Range   Prothrombin Time 15.4 (H) 11.4 - 15.2 seconds   INR 1.2 0.8 - 1.2    Comment: (NOTE) INR goal varies based on device and disease states. Performed at [facility], [place] [address] [place], [place] [address]     CT Lumbar Spine Wo Contrast  Result Date: 03/01/2021 CLINICAL DATA:  Back trauma, unwitnessed fall EXAM: CT LUMBAR SPINE WITHOUT CONTRAST TECHNIQUE: Multidetector CT imaging of the lumbar spine was performed without intravenous contrast administration. Multiplanar CT image reconstructions were also generated. RADIATION DOSE REDUCTION: This exam was performed according to the departmental dose-optimization program which includes automated exposure control, adjustment of the mA and/or kV according to patient size and/or use of iterative reconstruction technique. COMPARISON:  No prior lumbar spine  CT, correlation is made with CT abdomen pelvis 07/10/2016 and lumbar spine MRI 03/17/2009. FINDINGS: Segmentation: 5 lumbar type vertebrae. Alignment: Unchanged mild retrolisthesis L5 on S1, unchanged. No acute listhesis. Mild straightening of the normal lumbar lordosis. Vertebrae: No acute fracture or focal pathologic lesion. Redemonstrated compression deformity of L1, status post kyphoplasty, which appears unchanged compared to 2018. Osseous fusion across L5-S1. Degenerative osseous fusion across the left sacroiliac joint. Paraspinal and other soft tissues: Aortic atherosclerosis. Disc levels: No significant degenerative changes at T12-L4. L4-L5: Broad-based disc bulge. Mild-to-moderate facet arthropathy. Ligamentum flavum hypertrophy. No definite spinal canal stenosis. Mild bilateral neural foraminal narrowing. L5-S1: Osseous fusion with fixed retrolisthesis. Moderate facet arthropathy. No spinal canal stenosis. Mild left-greater-than-right neural foraminal narrowing. IMPRESSION: 1. No acute fracture or traumatic listhesis. 2. Unchanged appearance of L1 compression deformity, status post kyphoplasty. 3. Degenerative changes in the lower lumbar spine and sacrum, with mild neural foraminal narrowing at L4-L5 and L5-S1. Electronically Signed   By: Merilyn Baba M.D.   On: 03/01/2021 02:01   CT Hip Right Wo Contrast  Result Date: 03/01/2021 CLINICAL DATA:  Trauma. EXAM: CT OF THE RIGHT HIP WITHOUT CONTRAST TECHNIQUE: Multidetector CT imaging of the right hip was performed according to the standard protocol. Multiplanar CT image reconstructions were also generated. RADIATION DOSE REDUCTION: This exam was performed according to the departmental dose-optimization program which includes automated exposure control, adjustment of the mA and/or kV according to patient size and/or use of iterative reconstruction technique. COMPARISON:  Right hip x-ray 03/01/2021. CT abdomen and pelvis 07/10/2016. FINDINGS:  Bones/Joint/Cartilage There is a right hip arthroplasty in anatomic alignment. There is no evidence for hardware loosening. Or proximal portion of fracture is near the cerclage wire and distal aspect of the fracture is 2.5 cm distal to the tip of the femoral component of the arthroplasty. There is a healed right inferior pubic ramus fracture. No dislocation. Ligaments Suboptimally assessed by CT. Muscles and Tendons Grossly within normal limits. Limited evaluation secondary to streak artifact. Soft tissues Grossly within normal limits. Limited evaluation secondary to streak artifact. IMPRESSION: 1. Acute nondisplaced oblique fracture of the proximal femoral diaphysis at and below the level of the femoral component of the hip arthroplasty. 2. Right hip arthroplasty in anatomic alignment. Electronically Signed   By: Ronney Asters M.D.   On: 03/01/2021 02:01   DG HIP UNILAT WITH PELVIS 2-3 VIEWS RIGHT  Result Date: 03/01/2021 CLINICAL DATA:  Unwitnessed fall.  Right hip and back pain.  EXAM: DG HIP (WITH OR WITHOUT PELVIS) 2-3V RIGHT COMPARISON:  Similar study 10/04/2016. FINDINGS: Generalized osteopenia. Right hip arthroplasty is again noted without evidence of significant loosening, dislocation of the prosthesis, or perihardware fractures. On the left, an old hip nailing is again noted and there is increased flattening of the superior femoral head consistent with avascular necrosis with moderate secondary DJD also increased. There are numerous surgical clips along the pelvic sidewalls. Ankylosis of portions of both SI joints. Pelvic enthesopathy is again shown as well.  Penile implant. IMPRESSION: 1. Osteopenia, degenerative and postsurgical change. 2. No evidence of fractures. 3. Increased superior left femoral head flattening consistent with interval avascular necrosis with articular surface subsidence, moderate secondary DJD. Electronically Signed   By: Telford Nab M.D.   On: 03/01/2021 00:49   DG FEMUR  PORT, MIN 2 VIEWS RIGHT  Result Date: 03/01/2021 CLINICAL DATA:  Fall, RIGHT hip fracture EXAM: RIGHT FEMUR PORTABLE 2 VIEW COMPARISON:  None. FINDINGS: RIGHT hip prosthetic located. No fracture of the proximal femur. No fracture of the distal femur. Knee joint intact on one view. IMPRESSION: No fracture or dislocation. Electronically Signed   By: Suzy Bouchard M.D.   On: 03/01/2021 08:22    Intake/Output      01/26 0701 01/27 0700 01/27 0701 01/28 0700   I.V. (mL/kg) 468.7 (6.7)    Total Intake(mL/kg) 468.7 (6.7)    Urine (mL/kg/hr) 700 (0.4)    Total Output 700    Net -231.3         Urine Occurrence 3 x    Stool Occurrence 1 x       Review of Systems  Unable to perform ROS: Dementia  Blood pressure 140/71, pulse 80, temperature 98.5 F (36.9 C), temperature source Oral, resp. rate 18, height 5\' 11"  (1.803 m), weight 70.3 kg, SpO2 97 %. Physical Exam Vitals and nursing note reviewed.  Constitutional:      Comments: Sleeping but arousable Elderly appearing male  Cardiovascular:     Heart sounds: S1 normal and S2 normal.  Pulmonary:     Comments: Unlabored Abdominal:     Comments: Soft, nontender, nondistended, + BS  Musculoskeletal:     Comments: Right lower extremity No gross deformities noted to the right leg Resting position is appropriate without any excessive rotation or shortening Patient has exquisite tenderness with palpation over his proximal femur as well as with gentle manipulation of his right leg Motor and sensory functions appear to be grossly intact however he is not participating in exam fully + DP pulse Extremity is warm No gross instability of the knee lower leg ankle or foot No traumatic wounds  Skin:    General: Skin is warm.   Ortho Exam  Assessment/Plan:  88 year old male with complex medical history with nondisplaced right proximal femur periprosthetic fracture  -Ground-level fall  -Right proximal femur periprosthetic fracture  We do  feel that given his clinical exam and baseline function surgical intervention is warranted.  Think that it would improve his pain control.  We are also concerned given his impulsivity that he may get up and fall on it again leading to a displaced fracture creating a more complex problem  Again have discussed with legal guardian and they are reaching out to their superiors for approval   Given his dementia he will likely be weightbearing as tolerated on his right leg postoperatively   Return to SNF once stable postop  - Pain management:  Multimodal, minimize narcotics  -  ABL anemia/Hemodynamics  Monitor - Medical issues   Per primary  - ID:   Perioperative antibiotics  - Metabolic Bone Disease:  Pattern suggestive of fragility fracture  Check vitamin D  - Dispo:  OR today pending approval from [place], PA-C [phone] (C) 03/02/2021, 11:54 AM  [facility] [place] [address] [phone] Jenetta Downer667 [phone] (F)    After 5pm and on the weekends please log on to Amion, go to orthopaedics and the look under the [facility] Call for the provider(s) on call. You can also call our office at [phone] and then follow the prompts to be connected to the call team.

## 2021-03-02 NOTE — Op Note (Signed)
Orthopaedic Surgery Operative Note (CSN: [id number] ) Date of Surgery: 03/02/2021  Admit Date: 02/28/2021   Diagnoses: Pre-Op Diagnoses: Right periprosthetic proximal femur fracture  Post-Op Diagnosis: Same  Procedures: CPT 27244-Open reduction internal fixation of right periprosthetic femur fracture  Surgeons : Primary: Shona Needles, MD  Assistant: Patrecia Pace, PA-C  Location: OR 3   Anesthesia:Spinal   Antibiotics: Ancef 2g preop with 1 gm vancomycin powder placed topically   Tourniquet time:None used    Estimated Blood WIOM:35 mL  Complications:None   Specimens:None   Implants: Implant Name Type Inv. Item Serial No. Manufacturer Lot No. LRB No. Used Action  PLATE PROXIMAL FEMUR 12H RT - [id number] Plate PLATE PROXIMAL FEMUR 12H RT  [employer](ORTH,TRAU,BIO,SG)  Right 1 Implanted  SCREW CORT NCB SELFTAP 5.0X42 - [id number] Screw SCREW CORT NCB SELFTAP 5.0X42  [employer](ORTH,TRAU,BIO,SG)  Right 3 Implanted  SCREW NCB 4.0MX42M - [id number] Screw SCREW NCB 4.0MX42M  [employer](ORTH,TRAU,BIO,SG)  Right 1 Implanted  SCREW NCB 4.3BC48G - [id number] Screw SCREW NCB 4.8UE28M  [employer](ORTH,TRAU,BIO,SG)  Right 1 Implanted  SCREW NCB 4.0MX50M - [id number] Screw SCREW NCB 4.0MX50M  [employer](ORTH,TRAU,BIO,SG)  Right 2 Implanted  SCREW NCB 4.0VW97X - [id number] Screw SCREW NCB 4.4MO70B  [employer](ORTH,TRAU,BIO,SG)  Right 3 Implanted  SCREW HUM NCB PA ST 4X60 - [id number] Screw SCREW HUM NCB PA ST 4X60  [employer](ORTH,TRAU,BIO,SG)  Right 1 Implanted  CAP LOCK NCB - [id number] Cap CAP LOCK NCB  [employer](ORTH,TRAU,BIO,SG)  Right 8 Implanted     Indications for Surgery: 88 year old male who fell and sustained a right minimally displaced periprosthetic proximal femur fracture.  Due to his persistent mobility and continued pain I recommended proceeding with open reduction internal fixation.  Risks and benefits were discussed with the patient's legal guardian which was  at [place] and they agreed to proceed with surgery and consent was obtained.  Operative Findings: Open reduction internal fixation of right periprosthetic proximal femur fracture using [employer] NCB proximal femoral locking plate.  Procedure: The patient was identified in the preoperative holding area. Consent was confirmed with the patient and their family and all questions were answered. The operative extremity was marked after confirmation with the patient. he was then brought back to the operating room by our anesthesia colleagues.  He was placed under spinal anesthetic and carefully transferred over to a radiolucent flat top table.  A bump was placed under his operative hip.  The right lower extremity was prepped and draped in usual sterile fashion.  A timeout was performed to verify the patient, the procedure, and the extremity.  Preoperative antibiotics were dosed.  The hip and knee were flexed over a triangle and fluoroscopic imaging was obtained.  A small incision at the proximal femur was made and carried down through skin and subcutaneous tissue.  The IT band was split in line with my incision and I then performed a release of the vastus lateralis off of the proximal femur.  I then developed the interval along the lateral cortex of the femur.  I then chose a 12 hole [employer] NCB distal femoral locking plate and slid this submuscularly along the lateral cortex of the femur.  I placed a K wire proximally to hold the position.  I then percutaneously placed a threaded drill guide in the distal hole of the plate and placed a 3.3 mm drill bit bicortically.  I then drilled and placed bicortical 4.0 millimeter screws around the femoral stem.  I then percutaneously  placed a 5.0 millimeter screws in the femoral shaft.  Locking caps were placed on all the proximal screws and the locking caps were placed on all a 5.0 millimeter screws into the femoral shaft.  Final fluoroscopic imaging was obtained.   The incision was copiously irrigated.  A gram of vancomycin powder was placed in the incision.  A layered closure of 0 Vicryl, 2-0 Vicryl and 3-0 Monocryl was used to close the skin.  Dermabond was used to seal the skin.  Sterile dressings were placed.  The patient was then awoke from anesthesia and taken to the PACU in stable condition.  Post Op Plan/Instructions: The patient will be weightbearing as tolerated to the right lower extremity.  He will receive postoperative Ancef.  He will receive Lovenox for DVT prophylaxis while in the hospital and discharged on aspirin 325 mg twice daily.  We will have her mobilize with physical and Occupational Therapy as tolerated.  I was present and performed the entire surgery.  Patrecia Pace, PA-C did assist me throughout the case. An assistant was necessary given the difficulty in approach, maintenance of reduction and ability to instrument the fracture.   Katha Hamming, MD [facility]

## 2021-03-02 NOTE — Anesthesia Preprocedure Evaluation (Addendum)
Anesthesia Evaluation  Patient identified by MRN, date of birth, ID band Patient confused    Reviewed: Allergy & Precautions, NPO status , Patient's Chart, lab work & pertinent test results  Airway Mallampati: II   Neck ROM: Full    Dental  (+) Edentulous Upper, Edentulous Lower   Pulmonary COPD,  COPD inhaler, former smoker,  Lung Ca   Pulmonary exam normal        Cardiovascular hypertension, Pt. on medications + CAD  Normal cardiovascular exam+ dysrhythmias      Neuro/Psych Anxiety Dementia    GI/Hepatic negative GI ROS, Neg liver ROS,   Endo/Other  diabetes, Type 2  Renal/GU    Prostate Ca    Musculoskeletal Right periprosthetic fx   Abdominal Normal abdominal exam  (+)   Peds  Hematology  (+) anemia ,   Anesthesia Other Findings   Reproductive/Obstetrics                            Anesthesia Physical Anesthesia Plan  ASA: 3  Anesthesia Plan: MAC and Spinal   Post-op Pain Management:    Induction: Intravenous  PONV Risk Score and Plan: 1 and Propofol infusion and Treatment may vary due to age or medical condition  Airway Management Planned: Simple Face Mask, Natural Airway and Nasal Cannula  Additional Equipment: None  Intra-op Plan:   Post-operative Plan:   Informed Consent: I have reviewed the patients History and Physical, chart, labs and discussed the procedure including the risks, benefits and alternatives for the proposed anesthesia with the patient or authorized representative who has indicated his/her understanding and acceptance.   Patient has DNR.  Continue DNR.   Consent reviewed with POA  Plan Discussed with: CRNA  Anesthesia Plan Comments: (- Patient is a ward of the state. Consent reviewed and obtained with care team. Plan for spinal with GA back-up. DNR to remain in place with no ACLS medications, CPR, or defibrillation.   Lab Results      Component                 Value               Date                      WBC                      7.0                 03/02/2021                HGB                      12.9 (L)            03/02/2021                HCT                      39.5                03/02/2021                MCV                      91.9  03/02/2021                PLT                      154                 03/02/2021           Lab Results      Component                Value               Date                      NA                       140                 03/02/2021                K                        4.0                 03/02/2021                CO2                      24                  03/02/2021                GLUCOSE                  101 (H)             03/02/2021                BUN                      20                  03/02/2021                CREATININE               1.14                03/02/2021                CALCIUM                  9.1                 03/02/2021                GFRNONAA                 >60                 03/02/2021          )       Anesthesia Quick Evaluation

## 2021-03-02 NOTE — TOC Initial Note (Addendum)
Transition of Care [facility]) - Initial/Assessment Note    Patient Details  Name: Thomas [name] MRN: [id number] Date of Birth: 1927/03/08  [facility]) CM/SW Contact:    Coralee Pesa, Kyle Phone Number: 03/02/2021, 10:50 AM  Clinical Narrative:                 CSW noted patient is from [facility] and reached out to admissions. Facility confirmed pt is from their long term memory care unit and pt can return when medically ready and with an updated covid test. CSW spoke with pt's legal guardian (DSS), who also confirms the plan is to return to facility [facility] will continue to follow for DC needs.  Expected Discharge Plan: Skilled Nursing Facility Barriers to Discharge: Continued Medical Work up   Patient Goals and CMS Choice Patient states their goals for this hospitalization and ongoing recovery are:: Pt is disoriented and unable to participate in goal setting. CMS Medicare.gov Compare Post Acute Care list provided to:: Patient Represenative (must comment) Choice offered to / list presented to : [place] / Guardian  Expected Discharge Plan and Services Expected Discharge Plan: [place] Acute Care Choice: Resumption of Svcs/PTA Provider Living arrangements for the past 2 months: [place]                                      Prior Living Arrangements/Services Living arrangements for the past 2 months: [place] Lives with:: Facility Resident Patient language and need for interpreter reviewed:: Yes Do you feel safe going back to the place where you live?: Yes      Need for Family Participation in Patient Care: Yes (Comment) Care giver support system in place?: Yes (comment)   Criminal Activity/Legal Involvement Pertinent to Current Situation/Hospitalization: No - Comment as needed  Activities of Daily Living Home Assistive Devices/Equipment: None ADL Screening (condition at time of  admission) Patient's cognitive ability adequate to safely complete daily activities?: No Is the patient deaf or have difficulty hearing?: No Does the patient have difficulty seeing, even when wearing glasses/contacts?: No Does the patient have difficulty concentrating, remembering, or making decisions?: Yes Patient able to express need for assistance with ADLs?: No Does the patient have difficulty dressing or bathing?: Yes Independently performs ADLs?: No Communication: Dependent Is this a change from baseline?: Pre-admission baseline Dressing (OT): Dependent Is this a change from baseline?: Pre-admission baseline Grooming: Dependent Is this a change from baseline?: Pre-admission baseline Feeding: Dependent Is this a change from baseline?: Pre-admission baseline Bathing: Dependent Is this a change from baseline?: Pre-admission baseline Toileting: Dependent Is this a change from baseline?: Pre-admission baseline In/Out Bed: Dependent Is this a change from baseline?: Pre-admission baseline Walks in Home: Dependent Is this a change from baseline?: Pre-admission baseline Does the patient have difficulty walking or climbing stairs?: No Weakness of Legs: None Weakness of Arms/Hands: None  Permission Sought/Granted Permission sought to share information with : Guardian Permission granted to share information with : Yes, Verbal Permission Granted  Share Information with NAME: [name]     Permission granted to share info w Relationship: Legal Gaurdian  Permission granted to share info w Contact Information: [phone]  Emotional Assessment Appearance:: Appears stated age Attitude/Demeanor/Rapport: Unable to Assess Affect (typically observed): Unable to Assess Orientation: : Oriented to Self   Psych Involvement: No (comment)  Admission  diagnosis:  Fracture [T14.8XXA] Fall [W19.XXXA] Right femoral fracture ([place]) [S72.91XA] Closed fracture of right hip, initial encounter  ([place]) [S72.001A] Patient Active Problem List   Diagnosis Date Noted   Right femoral fracture ([place]) 03/01/2021   Femur fracture, right ([place]) 10/02/2016   Closed displaced fracture of right femoral neck ([place]) 10/02/2016   Anemia 07/13/2016   UGIB (upper gastrointestinal bleed)    Hematemesis 07/11/2016   Hemoptysis 07/10/2016   HCAP (healthcare-associated pneumonia) 07/10/2016   Protein-calorie malnutrition, severe 05/05/2016   Nondisplaced intertrochanteric fracture of left femur, initial encounter for closed fracture ([place]) 05/03/2016   Type 2 diabetes mellitus with hyperglycemia, with long-term current use of insulin ([place]) 05/03/2016   Type 1 diabetes mellitus with hyperlipidemia ([place]) 05/03/2016   Essential hypertension 05/03/2016   Altered mental status 07/06/2015   Normocytic anemia 07/06/2015   Hypotension 07/06/2015   Anxiety 07/06/2015   Arterial hypotension    Abnormal laboratory test result 11/01/2012   Epigastric pain 09/30/2012   NSTEMI (non-ST elevated myocardial infarction) ([place]) 09/30/2012   Acute renal failure ([place]) 09/09/2012   Acute encephalopathy 09/07/2012   Malignant neoplasm of upper lobe, bronchus or lung 02/19/2011   Right bundle branch block with left anterior fascicular block 03/31/2009   CHEST PAIN-UNSPECIFIED 03/31/2009   Insulin dependent diabetes mellitus 07/18/2008   HYPERLIPIDEMIA-MIXED 07/18/2008   CAD, ARTERY BYPASS GRAFT 07/18/2008   COPD (chronic obstructive pulmonary disease) ([place]) 07/18/2008   PCP:  Hilbert Corrigan, MD Pharmacy:   [place], [place] - [address] [place] [address] Phone: [phone] Fax: [phone]     Social Determinants of Health (SDOH) Interventions    Readmission Risk Interventions No flowsheet data found.

## 2021-03-02 NOTE — Anesthesia Procedure Notes (Signed)
Spinal  Patient location during procedure: OR Start time: 03/02/2021 2:48 PM End time: 03/02/2021 2:55 PM Reason for block: surgical anesthesia Staffing Performed: anesthesiologist  Anesthesiologist: Nolon Nations, MD Preanesthetic Checklist Completed: patient identified, IV checked, site marked, risks and benefits discussed, surgical consent, monitors and equipment checked, pre-op evaluation and timeout performed Spinal Block Prep: DuraPrep and site prepped and draped Patient monitoring: heart rate, continuous pulse ox and blood pressure Approach: right paramedian Location: L3-4 Injection technique: single-shot Needle Needle gauge: 22 G Needle length: 9 cm Additional Notes Expiration date of kit checked and confirmed. Patient tolerated procedure well, without complications. Attempt x 2 from midline. Attempt x 1 R paramedian. CSF seen on withdrawing the needle.

## 2021-03-03 DIAGNOSIS — F0390 Unspecified dementia without behavioral disturbance: Secondary | ICD-10-CM

## 2021-03-03 DIAGNOSIS — J438 Other emphysema: Secondary | ICD-10-CM

## 2021-03-03 DIAGNOSIS — Z794 Long term (current) use of insulin: Secondary | ICD-10-CM

## 2021-03-03 DIAGNOSIS — S7291XD Unspecified fracture of right femur, subsequent encounter for closed fracture with routine healing: Secondary | ICD-10-CM

## 2021-03-03 DIAGNOSIS — I959 Hypotension, unspecified: Secondary | ICD-10-CM

## 2021-03-03 DIAGNOSIS — W19XXXA Unspecified fall, initial encounter: Secondary | ICD-10-CM

## 2021-03-03 DIAGNOSIS — E1165 Type 2 diabetes mellitus with hyperglycemia: Secondary | ICD-10-CM

## 2021-03-03 DIAGNOSIS — Y92129 Unspecified place in nursing home as the place of occurrence of the external cause: Secondary | ICD-10-CM

## 2021-03-03 LAB — CBC
HCT: 38.5 % — ABNORMAL LOW (ref 39.0–52.0)
HCT: 38.6 % — ABNORMAL LOW (ref 39.0–52.0)
Hemoglobin: 13.3 g/dL (ref 13.0–17.0)
Hemoglobin: 13.2 g/dL (ref 13.0–17.0)
MCH: 30.9 pg (ref 26.0–34.0)
MCH: 30.7 pg (ref 26.0–34.0)
MCHC: 34.5 g/dL (ref 30.0–36.0)
MCHC: 34.2 g/dL (ref 30.0–36.0)
MCV: 89.5 fL (ref 80.0–100.0)
MCV: 89.8 fL (ref 80.0–100.0)
Platelets: 154 10*3/uL (ref 150–400)
Platelets: 168 10*3/uL (ref 150–400)
RBC: 4.3 MIL/uL (ref 4.22–5.81)
RBC: 4.3 MIL/uL (ref 4.22–5.81)
RDW: 13.2 % (ref 11.5–15.5)
RDW: 13.1 % (ref 11.5–15.5)
WBC: 8.5 10*3/uL (ref 4.0–10.5)
WBC: 9.6 10*3/uL (ref 4.0–10.5)
nRBC: 0 % (ref 0.0–0.2)
nRBC: 0 % (ref 0.0–0.2)

## 2021-03-03 LAB — BASIC METABOLIC PANEL
Anion gap: 12 (ref 5–15)
Anion gap: 10 (ref 5–15)
BUN: 23 mg/dL (ref 8–23)
BUN: 25 mg/dL — ABNORMAL HIGH (ref 8–23)
CO2: 20 mmol/L — ABNORMAL LOW (ref 22–32)
CO2: 22 mmol/L (ref 22–32)
Calcium: 9 mg/dL (ref 8.9–10.3)
Calcium: 9 mg/dL (ref 8.9–10.3)
Chloride: 106 mmol/L (ref 98–111)
Chloride: 107 mmol/L (ref 98–111)
Creatinine, Ser: 1.21 mg/dL (ref 0.61–1.24)
Creatinine, Ser: 1.28 mg/dL — ABNORMAL HIGH (ref 0.61–1.24)
GFR, Estimated: 57 mL/min — ABNORMAL LOW (ref >60)
GFR, Estimated: 53 mL/min — ABNORMAL LOW (ref >60)
Glucose, Bld: 185 mg/dL — ABNORMAL HIGH (ref 70–99)
Glucose, Bld: 169 mg/dL — ABNORMAL HIGH (ref 70–99)
Potassium: 4.3 mmol/L (ref 3.5–5.1)
Potassium: 4.6 mmol/L (ref 3.5–5.1)
Sodium: 138 mmol/L (ref 135–145)
Sodium: 139 mmol/L (ref 135–145)

## 2021-03-03 LAB — GLUCOSE, CAPILLARY
Glucose-Capillary: 157 mg/dL — ABNORMAL HIGH (ref 70–99)
Glucose-Capillary: 144 mg/dL — ABNORMAL HIGH (ref 70–99)
Glucose-Capillary: 128 mg/dL — ABNORMAL HIGH (ref 70–99)
Glucose-Capillary: 127 mg/dL — ABNORMAL HIGH (ref 70–99)

## 2021-03-03 LAB — MAGNESIUM: Magnesium: 1.8 mg/dL (ref 1.7–2.4)

## 2021-03-03 MED ORDER — ENOXAPARIN SODIUM 40 MG/0.4ML IJ SOSY
40.0000 mg | PREFILLED_SYRINGE | INTRAMUSCULAR | Status: DC
  Administered 2021-03-03 – 2021-03-06 (×4): 40 mg via SUBCUTANEOUS
  Filled 2021-03-03 (×4): qty 0.4

## 2021-03-03 MED ORDER — FENTANYL CITRATE PF 50 MCG/ML IJ SOSY: 12.5000 ug | PREFILLED_SYRINGE | INTRAMUSCULAR | Status: DC | PRN

## 2021-03-03 MED ORDER — SODIUM CHLORIDE 0.9 % IV SOLN: INTRAVENOUS | Status: DC

## 2021-03-03 MED ORDER — METOCLOPRAMIDE HCL 5 MG PO TABS: 5.0000 mg | ORAL_TABLET | Freq: Three times a day (TID) | ORAL | Status: DC | PRN

## 2021-03-03 MED ORDER — ACETAMINOPHEN 325 MG PO TABS: 325.0000 mg | ORAL_TABLET | Freq: Four times a day (QID) | ORAL | Status: DC | PRN

## 2021-03-03 MED ORDER — MORPHINE SULFATE (PF) 2 MG/ML IV SOLN: 0.5000 mg | INTRAVENOUS | Status: DC | PRN

## 2021-03-03 MED ORDER — MIDODRINE HCL 5 MG PO TABS
5.0000 mg | ORAL_TABLET | Freq: Three times a day (TID) | ORAL | Status: DC
  Administered 2021-03-03 – 2021-03-06 (×7): 5 mg via ORAL
  Filled 2021-03-03 (×7): qty 1

## 2021-03-03 MED ORDER — CEFAZOLIN SODIUM-DEXTROSE 2-4 GM/100ML-% IV SOLN
2.0000 g | Freq: Three times a day (TID) | INTRAVENOUS | Status: AC
  Administered 2021-03-03 (×3): 2 g via INTRAVENOUS
  Filled 2021-03-03 (×3): qty 100

## 2021-03-03 MED ORDER — ACETAMINOPHEN 500 MG PO TABS
1000.0000 mg | ORAL_TABLET | Freq: Three times a day (TID) | ORAL | Status: DC
  Administered 2021-03-03 – 2021-03-06 (×8): 1000 mg via ORAL
  Filled 2021-03-03 (×8): qty 2

## 2021-03-03 MED ORDER — METOCLOPRAMIDE HCL 5 MG/ML IJ SOLN: 5.0000 mg | Freq: Three times a day (TID) | INTRAMUSCULAR | Status: DC | PRN

## 2021-03-03 MED ORDER — TRANEXAMIC ACID-NACL 1000-0.7 MG/100ML-% IV SOLN: 1000.0000 mg | Freq: Once | INTRAVENOUS | Status: DC

## 2021-03-03 MED ORDER — ONDANSETRON HCL 4 MG PO TABS: 4.0000 mg | ORAL_TABLET | Freq: Four times a day (QID) | ORAL | Status: DC | PRN

## 2021-03-03 MED ORDER — DOCUSATE SODIUM 100 MG PO CAPS
100.0000 mg | ORAL_CAPSULE | Freq: Two times a day (BID) | ORAL | Status: DC
  Administered 2021-03-03 – 2021-03-06 (×5): 100 mg via ORAL
  Filled 2021-03-03 (×7): qty 1

## 2021-03-03 MED ORDER — ACETAMINOPHEN 325 MG PO TABS
650.0000 mg | ORAL_TABLET | Freq: Four times a day (QID) | ORAL | Status: DC
  Administered 2021-03-03: 650 mg via ORAL
  Filled 2021-03-03: qty 2

## 2021-03-03 MED ORDER — ONDANSETRON HCL 4 MG/2ML IJ SOLN: 4.0000 mg | Freq: Four times a day (QID) | INTRAMUSCULAR | Status: DC | PRN

## 2021-03-03 MED ORDER — OXYCODONE HCL 5 MG PO TABS: 5.0000 mg | ORAL_TABLET | Freq: Three times a day (TID) | ORAL | Status: DC | PRN

## 2021-03-03 MED ORDER — POLYETHYLENE GLYCOL 3350 17 G PO PACK: 17.0000 g | PACK | Freq: Every day | ORAL | Status: DC | PRN

## 2021-03-03 NOTE — Progress Notes (Signed)
PROGRESS NOTE  Thomas [name] QJF:[id number] DOB: 1927-12-16   PCP: Hilbert Corrigan, MD  Patient is from: SNF.  DOA: 02/28/2021 LOS: 2  Chief complaints:  Chief Complaint  Patient presents with   Fall     Brief Narrative / Interim history: 88 year old M with PMH of dementia, DM-2, CAD/CABG, lung cancer, prostate cancer and hyperlipidemia presented to [facility] with right hip pain and back pain after unwitnessed fall at SNF, and found to have right periprosthetic femoral fracture.  He underwent ORIF by Dr. Doreatha Martin on 03/02/2021.  CT lumbar spine, right knee and pelvic x-ray without acute finding.  Subjective: Seen and examined earlier this morning.  Patient is awake but not oriented.  Does not follows commands.  Does not appear to be in distress or pain.  Objective: Vitals:   03/02/21 1700 03/02/21 2107 03/03/21 0556 03/03/21 0744  BP: 123/70 (!) 144/84 (!) 100/45 (!) 103/47  Pulse: 83 94 69 63  Resp: 14 20 20 16   Temp: 98 F (36.7 C) 98.4 F (36.9 C) 97.6 F (36.4 C) 98.4 F (36.9 C)  TempSrc:  Oral Oral Axillary  SpO2: 96% 93% 96% 96%  Weight:      Height:        Examination:  GENERAL: No apparent distress.  Nontoxic. HEENT: MMM.  Vision and hearing grossly intact.  NECK: Supple.  No apparent JVD.  RESP: 96% on RA.  No IWOB.  Fair aeration bilaterally. CVS:  RRR. Heart sounds normal.  ABD/GI/GU: BS+. Abd soft, NTND.  MSK/EXT:  Moves extremities. No apparent deformity. No edema.  SKIN: Dressing over right leg DCI. NEURO: Awake but not oriented.  Does not follows command.  No apparent focal neuro deficit but limited exam due to mental status. PSYCH: Calm. Normal affect.   Procedures:  1/27-ORIF of right periprosthetic femoral fracture  Microbiology summarized: COVID-19 and influenza PCR nonreactive.  Assessment & Plan: Unwitnessed fall at nursing home Right femoral periprosthetic fracture likely due to fall -S/p ORIF by Dr. Doreatha Martin on 1/27. -Pain  control and VTE prophylaxis per orthopedic surgery -PT/OT  Hypotension/history of hypertension: Seems to be on Imdur and Cardizem prior to admission. -Continue IV fluid -Added parameters to Cardizem CD -Continue holding Imdur -Add midodrine 5 mg 3 times daily. -Check a.m. cortisol     NIDDM-2: A1c 6.2%.  Does not seem to be on medication for this. Recent Labs  Lab 03/02/21 1159 03/02/21 1617 03/02/21 2121 03/03/21 0758 03/03/21 1120  GLUCAP 95 107* 179* 157* 144*  -Continue SSI   Chronic COPD: Stable. -Continue inhalers  Dyslipidemia:  -Atorvastatin 40 mg p.o. daily  Dementia without behavioral disturbance: Patient is awake but not oriented to anything.  Does not follows command. -Reorientation delirium precautions -Continue home Zyprexa and Depakote  Increased nutrient needs Body mass index is 21.62 kg/m.  -Liberate diet -Continue Ensure       DVT prophylaxis:  enoxaparin (LOVENOX) injection 40 mg Start: 03/03/21 0800 SCDs Start: 03/03/21 0444 SCDs Start: 03/01/21 0650  Code Status: DNR/DNI Family Communication: Patient and/or RN. Available if any question.  Level of care: Med-Surg Status is: Inpatient  Remains inpatient appropriate because: Postop care after ORIF   Final disposition: Likely back to nursing home    Consultants:  Orthopedic surgery   Sch Meds:  Scheduled Meds:  acetaminophen  650 mg Oral Q6H   atorvastatin  40 mg Oral Daily   bisacodyl  5 mg Oral Daily   diltiazem  120 mg Oral Daily  divalproex  375 mg Oral BID   docusate sodium  100 mg Oral BID   enoxaparin (LOVENOX) injection  40 mg Subcutaneous Q24H   feeding supplement  237 mL Oral BID WC   insulin aspart  0-5 Units Subcutaneous QHS   insulin aspart  0-9 Units Subcutaneous TID WC   melatonin  6 mg Oral QHS   midodrine  5 mg Oral TID WC   mometasone-formoterol  2 puff Inhalation BID   OLANZapine  5 mg Oral QHS   polyethylene glycol  17 g Oral Daily   umeclidinium  bromide  1 puff Inhalation Daily   Continuous Infusions:  sodium chloride 100 mL/hr at 03/03/21 0526    ceFAZolin (ANCEF) IV 2 g (03/03/21 0529)   PRN Meds:.food thickener, ipratropium-albuterol, LORazepam, metoCLOPramide **OR** metoCLOPramide (REGLAN) injection, morphine injection, ondansetron **OR** ondansetron (ZOFRAN) IV, oxyCODONE, polyethylene glycol  Antimicrobials: Anti-infectives (From admission, onward)    Start     Dose/Rate Route Frequency Ordered Stop   03/03/21 0600  ceFAZolin (ANCEF) IVPB 2g/100 mL premix        2 g 200 mL/hr over 30 Minutes Intravenous Every 8 hours 03/03/21 0443 03/04/21 0559   03/02/21 1551  vancomycin (VANCOCIN) powder  Status:  Discontinued          As needed 03/02/21 1551 03/02/21 1610   03/02/21 1400  ceFAZolin (ANCEF) IVPB 2g/100 mL premix  Status:  Discontinued        2 g 200 mL/hr over 30 Minutes Intravenous On call to O.R. 03/02/21 1146 03/03/21 0442        I have personally reviewed the following labs and images: CBC: Recent Labs  Lab 03/01/21 0349 03/02/21 0926 03/03/21 0217 03/03/21 0455  WBC 8.1 7.0 8.5 9.6  NEUTROABS 5.3  --   --   --   HGB 13.3 12.9* 13.3 13.2  HCT 40.5 39.5 38.5* 38.6*  MCV 95.1 91.9 89.5 89.8  PLT 164 154 154 168   BMP &GFR Recent Labs  Lab 03/01/21 0349 03/02/21 0926 03/03/21 0217 03/03/21 0455  NA 138 140 138 139  K 3.9 4.0 4.3 4.6  CL 109 107 106 107  CO2 22 24 20* 22  GLUCOSE 100* 101* 185* 169*  BUN 25* 20 23 25*  CREATININE 1.13 1.14 1.21 1.28*  CALCIUM 8.8* 9.1 9.0 9.0  MG  --   --  1.8  --    Estimated Creatinine Clearance: 37.4 mL/min (A) (by C-G formula based on SCr of 1.28 mg/dL (H)). Liver & Pancreas: Recent Labs  Lab 03/02/21 0926  AST 16  ALT 20  ALKPHOS 53  BILITOT 1.1  PROT 6.3*  ALBUMIN 3.1*   No results for input(s): LIPASE, AMYLASE in the last 168 hours. No results for input(s): AMMONIA in the last 168 hours. Diabetic: Recent Labs    03/01/21 0349  HGBA1C  6.2*   Recent Labs  Lab 03/02/21 1159 03/02/21 1617 03/02/21 2121 03/03/21 0758 03/03/21 1120  GLUCAP 95 107* 179* 157* 144*   Cardiac Enzymes: No results for input(s): CKTOTAL, CKMB, CKMBINDEX, TROPONINI in the last 168 hours. No results for input(s): PROBNP in the last 8760 hours. Coagulation Profile: Recent Labs  Lab 03/02/21 0926  INR 1.2   Thyroid Function Tests: No results for input(s): TSH, T4TOTAL, FREET4, T3FREE, THYROIDAB in the last 72 hours. Lipid Profile: No results for input(s): CHOL, HDL, LDLCALC, TRIG, CHOLHDL, LDLDIRECT in the last 72 hours. Anemia Panel: No results for input(s): VITAMINB12, FOLATE, FERRITIN,  TIBC, IRON, RETICCTPCT in the last 72 hours. Urine analysis:    Component Value Date/Time   COLORURINE YELLOW 10/01/2016 2326   APPEARANCEUR CLEAR 10/01/2016 2326   LABSPEC 1.015 10/01/2016 2326   PHURINE 5.0 10/01/2016 2326   GLUCOSEU NEGATIVE 10/01/2016 2326   HGBUR NEGATIVE 10/01/2016 2326   BILIRUBINUR NEGATIVE 10/01/2016 2326   KETONESUR NEGATIVE 10/01/2016 2326   PROTEINUR NEGATIVE 10/01/2016 2326   UROBILINOGEN 0.2 11/13/2012 1238   NITRITE NEGATIVE 10/01/2016 2326   LEUKOCYTESUR NEGATIVE 10/01/2016 2326   Sepsis Labs: Invalid input(s): PROCALCITONIN, [place]  Microbiology: Recent Results (from the past 240 hour(s))  Resp Panel by RT-PCR (Flu A&B, Covid) Nasopharyngeal Swab     Status: None   Collection Time: 03/01/21  2:34 AM   Specimen: Nasopharyngeal Swab; Nasopharyngeal(NP) swabs in vial transport medium  Result Value Ref Range Status   SARS Coronavirus 2 by RT PCR NEGATIVE NEGATIVE Final    Comment: (NOTE) SARS-CoV-2 target nucleic acids are NOT DETECTED.  The SARS-CoV-2 RNA is generally detectable in upper respiratory specimens during the acute phase of infection. The lowest concentration of SARS-CoV-2 viral copies this assay can detect is 138 copies/mL. A negative result does not preclude SARS-Cov-2 infection and  should not be used as the sole basis for treatment or other patient management decisions. A negative result may occur with  improper specimen collection/handling, submission of specimen other than nasopharyngeal swab, presence of viral mutation(s) within the areas targeted by this assay, and inadequate number of viral copies(<138 copies/mL). A negative result must be combined with clinical observations, patient history, and epidemiological information. The expected result is Negative.  Fact Sheet for Patients:  EntrepreneurPulse.com.au  Fact Sheet for Healthcare Providers:  IncredibleEmployment.be  This test is no t yet approved or cleared by the [place] FDA and  has been authorized for detection and/or diagnosis of SARS-CoV-2 by FDA under an Emergency Use Authorization (EUA). This EUA will remain  in effect (meaning this test can be used) for the duration of the COVID-19 declaration under Section 564(b)(1) of the Act, 21 U.S.C.section 360bbb-3(b)(1), unless the authorization is terminated  or revoked sooner.       Influenza A by PCR NEGATIVE NEGATIVE Final   Influenza B by PCR NEGATIVE NEGATIVE Final    Comment: (NOTE) The Xpert Xpress SARS-CoV-2/FLU/RSV plus assay is intended as an aid in the diagnosis of influenza from Nasopharyngeal swab specimens and should not be used as a sole basis for treatment. Nasal washings and aspirates are unacceptable for Xpert Xpress SARS-CoV-2/FLU/RSV testing.  Fact Sheet for Patients: EntrepreneurPulse.com.au  Fact Sheet for Healthcare Providers: IncredibleEmployment.be  This test is not yet approved or cleared by the [place] FDA and has been authorized for detection and/or diagnosis of SARS-CoV-2 by FDA under an Emergency Use Authorization (EUA). This EUA will remain in effect (meaning this test can be used) for the duration of the COVID-19 declaration  under Section 564(b)(1) of the Act, 21 U.S.C. section 360bbb-3(b)(1), unless the authorization is terminated or revoked.  Performed at [facility], [address] [place], [place] [address]   Surgical pcr screen     Status: Abnormal   Collection Time: 03/02/21 11:23 AM   Specimen: Nasal Mucosa; Nasal Swab  Result Value Ref Range Status   MRSA, PCR POSITIVE (A) NEGATIVE Final    Comment: RESULT CALLED TO, READ BACK BY AND VERIFIED WITH: RN T Tamala Julian [phone] AT 1340 BY CM    Staphylococcus aureus POSITIVE (A) NEGATIVE Final  Comment: (NOTE) The Xpert SA Assay (FDA approved for NASAL specimens in patients 3 years of age and older), is one component of a comprehensive surveillance program. It is not intended to diagnose infection nor to guide or monitor treatment. Performed at [facility], [place] [address] [place], [place] [address]     Radiology Studies: DG C-Arm 1-60 Min-No Report  Result Date: 03/02/2021 Fluoroscopy was utilized by the requesting physician.  No radiographic interpretation.   DG FEMUR, MIN 2 VIEWS RIGHT  Result Date: 03/02/2021 CLINICAL DATA:  ORIF proximal right femur EXAM: RIGHT FEMUR 2 VIEWS COMPARISON:  X-ray right femur dated March 01 2021 FINDINGS: Fluoroscopic images were obtained intraoperatively and submitted for post operative interpretation. Interval ORIF of the right proximal femur with hardware in expected position, 6 images were obtained with 21 seconds of fluoroscopy time and 7.89 mGy. Please see the performing provider's procedural report for further detail. IMPRESSION: As above. Electronically Signed   By: Yetta Glassman M.D.   On: 03/02/2021 16:21   DG FEMUR PORT, MIN 2 VIEWS RIGHT  Result Date: 03/02/2021 CLINICAL DATA:  Postoperative right femur fracture. EXAM: RIGHT FEMUR PORTABLE 2 VIEW COMPARISON:  Right femur x-ray 03/01/2021. FINDINGS: There is a lateral femoral sideplate and screws and right hip arthroplasty. There is no evidence  for hardware loosening. Alignment appears anatomic. Fracture is again noted in the proximal femoral diaphysis. There are vascular calcifications in the soft tissues. There is lateral soft tissue swelling and air compatible with recent surgery. Surgical clips and penile implant are noted in the pelvis. There also surgical clips in the lower extremity. IMPRESSION: 1. New Femoral sideplate and screws fix a proximal femoral fracture. Alignment is anatomic. Electronically Signed   By: Ronney Asters M.D.   On: 03/02/2021 17:06       Saw [place] [facility] [place]  If 7PM-7AM, please contact night-coverage [email or web address] 03/03/2021, 1:11 PM

## 2021-03-03 NOTE — Anesthesia Postprocedure Evaluation (Signed)
Anesthesia Post Note  Patient: Thomas [name]  Procedure(s) Performed: OPEN REDUCTION INTERNAL FIXATION RIGHT PERIPROSTHETIC PROXIMAL FEMUR (Right: Leg Upper)     Patient location during evaluation: PACU Anesthesia Type: Spinal Level of consciousness: sedated Pain management: pain level controlled Vital Signs Assessment: post-procedure vital signs reviewed and stable Respiratory status: spontaneous breathing Cardiovascular status: stable Anesthetic complications: no   No notable events documented.  Last Vitals:  Vitals:   03/03/21 0556 03/03/21 0744  BP: (!) 100/45 (!) 103/47  Pulse: 69 63  Resp: 20 16  Temp: 36.4 C 36.9 C  SpO2: 96%     Last Pain:  Vitals:   03/03/21 0722  TempSrc:   PainSc: (P) Asleep                 Nolon Nations

## 2021-03-03 NOTE — Progress Notes (Signed)
RT note. Patient given IS at this time, Patient unable to perform at this time due to OT/PT being in room and also not being able to coordinate at this time from pain med. this AM. RT will continue to monitor.

## 2021-03-03 NOTE — Evaluation (Signed)
Occupational Therapy Evaluation Patient Details Name: Thomas [name] MRN: [id number] DOB: 1927/05/23 Today's Date: 03/03/2021   History of Present Illness Pt is a 88 y/o M presenting to ED on 1/25 from SNF after unwitnessed fall, c/o R hip and back pain. Found to have periprosthetic proximal femur fx. S/p ORIF of R femor on 1/27. PMH includes DM2, dyslipidemia, lung CA, prostate CA, and dementia.   Clinical Impression   Pt from [facility], questionable historian but reports use of RW at baseline. Pt very lethargic during session, requiring increased cuing to keep eyes open. At this time, pt max-total A +2 for ADLs and bed mobility, transfers via maximove lift to chair. Pt reporting increased pain with movement, reporting it hurts "all over". Limited assessment of BUE function due to pain as well. Pt presenting with impairments listed below, will follow acutely. Recommend SNF at d/c.     Recommendations for follow up therapy are one component of a multi-disciplinary discharge planning process, led by the attending physician.  Recommendations may be updated based on patient status, additional functional criteria and insurance authorization.   Follow Up Recommendations  Skilled nursing-short term rehab (<3 hours/day)    Assistance Recommended at Discharge Intermittent Supervision/Assistance  Patient can return home with the following Two people to help with walking and/or transfers;Two people to help with bathing/dressing/bathroom;Assistance with cooking/housework;Assistance with feeding;Direct supervision/assist for medications management;Direct supervision/assist for financial management;Assist for transportation;Help with stairs or ramp for entrance    Functional Status Assessment  Patient has had a recent decline in their functional status and demonstrates the ability to make significant improvements in function in a reasonable and predictable amount of time.  Equipment  Recommendations  None recommended by OT;Other (comment) (defer to next venue of care)    Recommendations for Other Services PT consult     Precautions / Restrictions Precautions Precautions: Fall Restrictions Weight Bearing Restrictions: Yes RLE Weight Bearing: Weight bearing as tolerated      Mobility Bed Mobility Overal bed mobility: Needs Assistance Bed Mobility: Rolling Rolling: Total assist, +2 for physical assistance, +2 for safety/equipment              Transfers Overall transfer level: Needs assistance   Transfers: Bed to chair/wheelchair/BSC             General transfer comment: pt able to roll R/L for pad placement prior to use of maximove Transfer via Lift Equipment: Maximove    Balance Overall balance assessment: Needs assistance Sitting-balance support: Feet supported Sitting balance-Leahy Scale: Poor Sitting balance - Comments: sits supported up in recliner                                   ADL either performed or assessed with clinical judgement   ADL Overall ADL's : Needs assistance/impaired Eating/Feeding: Moderate assistance;Bed level   Grooming: Moderate assistance;Sitting;Wash/dry face Grooming Details (indicate cue type and reason): washed face sitting up in recliner Upper Body Bathing: Total assistance;Bed level   Lower Body Bathing: Total assistance;Bed level   Upper Body Dressing : Total assistance;Bed level;Maximal assistance Upper Body Dressing Details (indicate cue type and reason): donned gown supine in bed     Toilet Transfer: Total assistance;+2 for physical assistance   Toileting- Clothing Manipulation and Hygiene: Total assistance;+2 for physical assistance       Functional mobility during ADLs: Total assistance;+2 for physical assistance;Maximal assistance  Vision   Vision Assessment?: No apparent visual deficits     Perception     Praxis      Pertinent Vitals/Pain Pain  Assessment Pain Assessment: Faces Pain Score: 7  Faces Pain Scale: Hurts whole lot Pain Location: R hip/RLE Pain Descriptors / Indicators: Constant, Discomfort, Grimacing, Guarding, Moaning, Operative site guarding Pain Intervention(s): Limited activity within patient's tolerance, Monitored during session, Premedicated before session, Repositioned     Hand Dominance     Extremity/Trunk Assessment Upper Extremity Assessment Upper Extremity Assessment: Generalized weakness;Difficult to assess due to impaired cognition (limited UE assessment due to pain when touching BUEs)   Lower Extremity Assessment Lower Extremity Assessment: Defer to PT evaluation       Communication Communication Communication: HOH   Cognition Arousal/Alertness: Lethargic, Suspect due to medications Behavior During Therapy: Flat affect Overall Cognitive Status: History of cognitive impairments - at baseline                                 General Comments: pt unable to specify location of pain during session, multiple cues to keep eyes open     General Comments  VSS on RA,    Exercises     Shoulder Instructions      Home Living Family/patient expects to be discharged to:: Skilled nursing facility                                 Additional Comments: per chart review pt from [facility]      Prior Functioning/Environment Prior Level of Function : History of Falls (last six months);Patient poor historian/Family not available             Mobility Comments: pt questionable historian, reports use of RW for mobility          OT Problem List: Decreased strength;Decreased range of motion;Impaired balance (sitting and/or standing);Decreased activity tolerance;Decreased cognition;Decreased coordination;Decreased safety awareness;Decreased knowledge of use of DME or AE;Decreased knowledge of precautions;Pain;Impaired UE functional use      OT  Treatment/Interventions:      OT Goals(Current goals can be found in the care plan section) Acute Rehab OT Goals Patient Stated Goal: none stated OT Goal Formulation: Patient unable to participate in goal setting Time For Goal Achievement: 03/17/21 Potential to Achieve Goals: Fair ADL Goals Pt Will Perform Upper Body Dressing: with mod assist;sitting;bed level Pt Will Perform Lower Body Dressing: with mod assist;bed level;sit to/from stand;sitting/lateral leans;with adaptive equipment Pt Will Transfer to Toilet: with +2 assist;with mod assist;squat pivot transfer;stand pivot transfer;bedside commode  OT Frequency:      Co-evaluation PT/OT/SLP Co-Evaluation/Treatment: Yes Reason for Co-Treatment: Complexity of the patient's impairments (multi-system involvement);For patient/therapist safety;To address functional/ADL transfers   OT goals addressed during session: ADL's and self-care;Proper use of Adaptive equipment and DME      AM-PAC OT "6 Clicks" Daily Activity     Outcome Measure Help from another person eating meals?: A Lot Help from another person taking care of personal grooming?: A Lot Help from another person toileting, which includes using toliet, bedpan, or urinal?: Total Help from another person bathing (including washing, rinsing, drying)?: Total Help from another person to put on and taking off regular upper body clothing?: Total Help from another person to put on and taking off regular lower body clothing?: Total 6 Click Score: 8   End of  Session Equipment Utilized During Treatment: Other (comment) Mayo Ao) Nurse Communication: Mobility status;Patient requests pain meds;Need for lift equipment;Weight bearing status;Precautions  Activity Tolerance: Patient limited by fatigue;Patient limited by pain Patient left: in chair;with chair alarm set;with nursing/sitter in room;with call bell/phone within reach  OT Visit Diagnosis: Unsteadiness on feet (R26.81);Other  abnormalities of gait and mobility (R26.89);Muscle weakness (generalized) (M62.81);History of falling (Z91.81);Pain Pain - Right/Left: Right Pain - part of body: Leg;Hip                Time: 7253-6644 OT Time Calculation (min): 23 min Charges:  OT General Charges $OT Visit: 1 Visit OT Evaluation $OT Eval Moderate Complexity: 1 [address] OTD, OTR/L Acute Rehab [phone]) [phone] - [place] 03/03/2021, 12:22 PM

## 2021-03-03 NOTE — Progress Notes (Signed)
Occupational Therapy Treatment Patient Details Name: Thomas [name] MRN: [id number] DOB: May 03, 1927 Today's Date: 03/03/2021   History of present illness Pt is a 88 y/o M presenting to ED on 1/25 from SNF after unwitnessed fall, c/o R hip and back pain. Found to have periprosthetic proximal femur fx. S/p ORIF of R femor on 1/27. PMH includes DM2, dyslipidemia, lung CA, prostate CA, and dementia.   OT comments  Pt seen for second session today, pt attempting to get out of recliner, RN requiring assist to transfer pt back to bed. Pt requires total A +2 for squat-pivot transfer back to bed, pt c/o back pain during transfer, RN aware and in room throughout session. Pt disoriented to place when asked, continues to respond that he is in pain. Pt presenting with impairments listed below, will follow acutely. Continue to recommend SNF at d/c.   Recommendations for follow up therapy are one component of a multi-disciplinary discharge planning process, led by the attending physician.  Recommendations may be updated based on patient status, additional functional criteria and insurance authorization.    Follow Up Recommendations  Skilled nursing-short term rehab (<3 hours/day)    Assistance Recommended at Discharge Intermittent Supervision/Assistance  Patient can return home with the following  Two people to help with walking and/or transfers;Two people to help with bathing/dressing/bathroom;Assistance with cooking/housework;Assistance with feeding;Direct supervision/assist for medications management;Direct supervision/assist for financial management;Assist for transportation;Help with stairs or ramp for entrance   Equipment Recommendations  Other (comment);None recommended by OT (defer to next venue of care)    Recommendations for Other Services PT consult    Precautions / Restrictions Precautions Precautions: Fall Restrictions Weight Bearing Restrictions: Yes RLE Weight Bearing: Weight bearing  as tolerated       Mobility Bed Mobility Overal bed mobility: Needs Assistance Bed Mobility: Rolling Rolling: Total assist, +2 for physical assistance              Transfers Overall transfer level: Needs assistance   Transfers: Bed to chair/wheelchair/BSC     Squat pivot transfers: +2 physical assistance, Total assist       General transfer comment: pt able to roll R/L for pad placement prior to use of maximove Transfer via Lift Equipment: Maximove   Balance Overall balance assessment: Needs assistance Sitting-balance support: Feet supported Sitting balance-Leahy Scale: Poor Sitting balance - Comments: unable to sit unsupported at EOB                                   ADL either performed or assessed with clinical judgement   ADL Overall ADL's : Needs assistance/impaired Eating/Feeding: Moderate assistance;Bed level   Grooming: Moderate assistance;Sitting;Wash/dry face Grooming Details (indicate cue type and reason): washed face sitting up in recliner Upper Body Bathing: Total assistance;Bed level   Lower Body Bathing: Total assistance;Bed level   Upper Body Dressing : Total assistance;Bed level;Maximal assistance Upper Body Dressing Details (indicate cue type and reason): donned gown supine in bed     Toilet Transfer: Total assistance;+2 for physical assistance Toilet Transfer Details (indicate cue type and reason): simulated chair > bed Toileting- Clothing Manipulation and Hygiene: Total assistance;+2 for physical assistance       Functional mobility during ADLs: Total assistance;+2 for physical assistance;Maximal assistance      Extremity/Trunk Assessment Upper Extremity Assessment Upper Extremity Assessment: Generalized weakness;Difficult to assess due to impaired cognition   Lower Extremity Assessment Lower Extremity Assessment: Defer to  PT evaluation        Vision   Vision Assessment?: No apparent visual deficits    Perception Perception Perception: Not tested   Praxis Praxis Praxis: Not tested    Cognition Arousal/Alertness: Awake/alert Behavior During Therapy: Restless, Agitated, Impulsive Overall Cognitive Status: History of cognitive impairments - at baseline                                 General Comments: pt attempting to get  out of chair        Exercises      Shoulder Instructions       General Comments VSS on RA    Pertinent Vitals/ Pain       Pain Assessment Pain Assessment: Faces Pain Score: 7  Faces Pain Scale: Hurts whole lot Pain Location: R hip/RLE Pain Descriptors / Indicators: Constant, Discomfort, Grimacing, Guarding, Moaning, Operative site guarding Pain Intervention(s): Limited activity within patient's tolerance, Monitored during session, Premedicated before session  Home Living Family/patient expects to be discharged to:: Skilled nursing facility                                 Additional Comments: per chart review pt from [facility]      Prior Functioning/Environment              Frequency           Progress Toward Goals  OT Goals(current goals can now be found in the care plan section)  Progress towards OT goals: Progressing toward goals  Acute Rehab OT Goals Patient Stated Goal: none stated OT Goal Formulation: Patient unable to participate in goal setting Time For Goal Achievement: 03/17/21 Potential to Achieve Goals: Fair ADL Goals Pt Will Perform Upper Body Dressing: with mod assist;sitting;bed level Pt Will Perform Lower Body Dressing: with mod assist;bed level;sit to/from stand;sitting/lateral leans;with adaptive equipment Pt Will Transfer to Toilet: with +2 assist;with mod assist;squat pivot transfer;stand pivot transfer;bedside commode  Plan Discharge plan remains appropriate;Frequency remains appropriate    Co-evaluation    PT/OT/SLP Co-Evaluation/Treatment: Yes Reason for  Co-Treatment: Complexity of the patient's impairments (multi-system involvement);For patient/therapist safety;To address functional/ADL transfers   OT goals addressed during session: ADL's and self-care;Proper use of Adaptive equipment and DME      AM-PAC OT "6 Clicks" Daily Activity     Outcome Measure   Help from another person eating meals?: A Lot Help from another person taking care of personal grooming?: A Lot Help from another person toileting, which includes using toliet, bedpan, or urinal?: Total Help from another person bathing (including washing, rinsing, drying)?: Total Help from another person to put on and taking off regular upper body clothing?: Total Help from another person to put on and taking off regular lower body clothing?: Total 6 Click Score: 8    End of Session Equipment Utilized During Treatment: Gait belt  OT Visit Diagnosis: Unsteadiness on feet (R26.81);Other abnormalities of gait and mobility (R26.89);Muscle weakness (generalized) (M62.81);History of falling (Z91.81);Pain Pain - Right/Left: Right Pain - part of body: Leg;Hip   Activity Tolerance Patient limited by fatigue;Patient limited by pain   Patient Left in bed;with call bell/phone within reach;with bed alarm set;with nursing/sitter in room   Nurse Communication Mobility status;Patient requests pain meds;Need for lift equipment;Weight bearing status;Precautions        Time: 0737-1062 OT Time Calculation (  min): 14 min  Charges: OT General Charges $OT Visit: 1 Visit OT Evaluation $OT Eval Moderate Complexity: 1 Mod OT Treatments $Self Care/Home Management : 8-22 mins  Lynnda Child, OTD, OTR/L Acute Rehab [phone] - [place] 03/03/2021, 3:10 PM

## 2021-03-03 NOTE — Progress Notes (Signed)
Orthopaedic Trauma Service Progress Note  Patient ID: Thomas [name] MRN: [id number] DOB/AGE: [birth year] 88 y.o.  Subjective:  Surgery yesterday No acute issues reported    Review of Systems  Unable to perform ROS: Dementia   Objective:   VITALS:   Vitals:   03/02/21 1700 03/02/21 2107 03/03/21 0556 03/03/21 0744  BP: 123/70 (!) 144/84 (!) 100/45 (!) 103/47  Pulse: 83 94 69 63  Resp: 14 20 20 16   Temp: 98 F (36.7 C) 98.4 F (36.9 C) 97.6 F (36.4 C) 98.4 F (36.9 C)  TempSrc:  Oral Oral Axillary  SpO2: 96% 93% 96% 96%  Weight:      Height:        Estimated body mass index is 21.62 kg/m as calculated from the following:   Height as of this encounter: 5\' 11"  (1.803 m).   Weight as of this encounter: 70.3 kg.   Intake/Output      01/27 0701 01/28 0700 01/28 0701 01/29 0700   I.V. (mL/kg) 1000 (14.2)    IV Piggyback 100    Total Intake(mL/kg) 1100 (15.6)    Urine (mL/kg/hr)  1000 (6.5)   Blood 50    Total Output 50 1000   Net +1050 -1000          LABS  Results for orders placed or performed during the hospital encounter of 02/28/21 (from the past 24 hour(s))  CBC     Status: Abnormal   Collection Time: 03/02/21  9:26 AM  Result Value Ref Range   WBC 7.0 4.0 - 10.5 K/uL   RBC 4.30 4.22 - 5.81 MIL/uL   Hemoglobin 12.9 (L) 13.0 - 17.0 g/dL   HCT 39.5 39.0 - 52.0 %   MCV 91.9 80.0 - 100.0 fL   MCH 30.0 26.0 - 34.0 pg   MCHC 32.7 30.0 - 36.0 g/dL   RDW 13.4 11.5 - 15.5 %   Platelets 154 150 - 400 K/uL   nRBC 0.0 0.0 - 0.2 %  Comprehensive metabolic panel     Status: Abnormal   Collection Time: 03/02/21  9:26 AM  Result Value Ref Range   Sodium 140 135 - 145 mmol/L   Potassium 4.0 3.5 - 5.1 mmol/L   Chloride 107 98 - 111 mmol/L   CO2 24 22 - 32 mmol/L   Glucose, Bld 101 (H) 70 - 99 mg/dL   BUN 20 8 - 23 mg/dL   Creatinine, Ser 1.14 0.61 - 1.24 mg/dL   Calcium 9.1 8.9 -  10.3 mg/dL   Total Protein 6.3 (L) 6.5 - 8.1 g/dL   Albumin 3.1 (L) 3.5 - 5.0 g/dL   AST 16 15 - 41 U/L   ALT 20 0 - 44 U/L   Alkaline Phosphatase 53 38 - 126 U/L   Total Bilirubin 1.1 0.3 - 1.2 mg/dL   GFR, Estimated >60 >60 mL/min   Anion gap 9 5 - 15  VITAMIN D 25 Hydroxy (Vit-D Deficiency, Fractures)     Status: None   Collection Time: 03/02/21  9:26 AM  Result Value Ref Range   Vit D, 25-Hydroxy 42.41 30 - 100 ng/mL  Protime-INR     Status: Abnormal   Collection Time: 03/02/21  9:26 AM  Result Value Ref Range   Prothrombin Time 15.4 (H) 11.4 -  15.2 seconds   INR 1.2 0.8 - 1.2  Surgical pcr screen     Status: Abnormal   Collection Time: 03/02/21 11:23 AM   Specimen: Nasal Mucosa; Nasal Swab  Result Value Ref Range   MRSA, PCR POSITIVE (A) NEGATIVE   Staphylococcus aureus POSITIVE (A) NEGATIVE  Valproic acid level     Status: Abnormal   Collection Time: 03/02/21 11:36 AM  Result Value Ref Range   Valproic Acid Lvl <10 (L) 50.0 - 100.0 ug/mL  Glucose, capillary     Status: None   Collection Time: 03/02/21 11:59 AM  Result Value Ref Range   Glucose-Capillary 95 70 - 99 mg/dL  Glucose, capillary     Status: Abnormal   Collection Time: 03/02/21  4:17 PM  Result Value Ref Range   Glucose-Capillary 107 (H) 70 - 99 mg/dL   Comment 1 Notify RN   Glucose, capillary     Status: Abnormal   Collection Time: 03/02/21  9:21 PM  Result Value Ref Range   Glucose-Capillary 179 (H) 70 - 99 mg/dL  CBC     Status: Abnormal   Collection Time: 03/03/21  2:17 AM  Result Value Ref Range   WBC 8.5 4.0 - 10.5 K/uL   RBC 4.30 4.22 - 5.81 MIL/uL   Hemoglobin 13.3 13.0 - 17.0 g/dL   HCT 38.5 (L) 39.0 - 52.0 %   MCV 89.5 80.0 - 100.0 fL   MCH 30.9 26.0 - 34.0 pg   MCHC 34.5 30.0 - 36.0 g/dL   RDW 13.2 11.5 - 15.5 %   Platelets 154 150 - 400 K/uL   nRBC 0.0 0.0 - 0.2 %  Basic metabolic panel     Status: Abnormal   Collection Time: 03/03/21  2:17 AM  Result Value Ref Range   Sodium 138  135 - 145 mmol/L   Potassium 4.3 3.5 - 5.1 mmol/L   Chloride 106 98 - 111 mmol/L   CO2 20 (L) 22 - 32 mmol/L   Glucose, Bld 185 (H) 70 - 99 mg/dL   BUN 23 8 - 23 mg/dL   Creatinine, Ser 1.21 0.61 - 1.24 mg/dL   Calcium 9.0 8.9 - 10.3 mg/dL   GFR, Estimated 57 (L) >60 mL/min   Anion gap 12 5 - 15  Magnesium     Status: None   Collection Time: 03/03/21  2:17 AM  Result Value Ref Range   Magnesium 1.8 1.7 - 2.4 mg/dL  Basic metabolic panel     Status: Abnormal   Collection Time: 03/03/21  4:55 AM  Result Value Ref Range   Sodium 139 135 - 145 mmol/L   Potassium 4.6 3.5 - 5.1 mmol/L   Chloride 107 98 - 111 mmol/L   CO2 22 22 - 32 mmol/L   Glucose, Bld 169 (H) 70 - 99 mg/dL   BUN 25 (H) 8 - 23 mg/dL   Creatinine, Ser 1.28 (H) 0.61 - 1.24 mg/dL   Calcium 9.0 8.9 - 10.3 mg/dL   GFR, Estimated 53 (L) >60 mL/min   Anion gap 10 5 - 15  CBC     Status: Abnormal   Collection Time: 03/03/21  4:55 AM  Result Value Ref Range   WBC 9.6 4.0 - 10.5 K/uL   RBC 4.30 4.22 - 5.81 MIL/uL   Hemoglobin 13.2 13.0 - 17.0 g/dL   HCT 38.6 (L) 39.0 - 52.0 %   MCV 89.8 80.0 - 100.0 fL   MCH 30.7 26.0 - 34.0 pg  MCHC 34.2 30.0 - 36.0 g/dL   RDW 13.1 11.5 - 15.5 %   Platelets 168 150 - 400 K/uL   nRBC 0.0 0.0 - 0.2 %  Glucose, capillary     Status: Abnormal   Collection Time: 03/03/21  7:58 AM  Result Value Ref Range   Glucose-Capillary 157 (H) 70 - 99 mg/dL     PHYSICAL EXAM:   Gen: resting comfortably in bed, frail elderly mail  Lungs: unlabored  Ext:       Right Lower Extremity   Dressing R hip clean, dry and intact  Ext warm   Mild swelling   No DCT   No actue changes in exam    Assessment/Plan: 1 Day Post-Op     Anti-infectives (From admission, onward)    Start     Dose/Rate Route Frequency Ordered Stop   03/03/21 0600  ceFAZolin (ANCEF) IVPB 2g/100 mL premix        2 g 200 mL/hr over 30 Minutes Intravenous Every 8 hours 03/03/21 0443 03/04/21 0559   03/02/21 1551   vancomycin (VANCOCIN) powder  Status:  Discontinued          As needed 03/02/21 1551 03/02/21 1610   03/02/21 1400  ceFAZolin (ANCEF) IVPB 2g/100 mL premix  Status:  Discontinued        2 g 200 mL/hr over 30 Minutes Intravenous On call to O.R. 03/02/21 1146 03/03/21 0442     .  POD/HD#: 27  88 year old male with complex medical history with nondisplaced right proximal femur periprosthetic fracture   -Ground-level fall   -Right proximal femur periprosthetic fracture s/p ORIF                WBAT R leg     Therapy evals     Dressing changes as needed starting tomorrow       Return to SNF once stable     - Pain management:               Multimodal, minimize narcotics    Schedule tylenol     - ABL anemia/Hemodynamics               Monitor    Stable  - Medical issues                Per primary  - DVT/PE prophylaxis   Lovenox    - ID:                Perioperative antibiotics   - Metabolic Bone Disease:               Pattern suggestive of fragility fracture               vitamin d levels look good at 42.41 ng/mL   - Dispo:               ortho issues stable     Therapies       Snf once stable     Jari Pigg, PA-C [phone] (C) 03/03/2021, 9:12 AM  [facility] [place] [phone] Jenetta [phone] (F)    After 5pm and on the weekends please log on to Amion, go to orthopaedics and the look under the [facility] Call for the provider(s) on call. You can also call our office at [phone] and then follow the prompts to be connected to the call team.   Patient ID: Thomas [name], male  DOB: [birth year], 88 y.o.   MRN: [id number]

## 2021-03-03 NOTE — Evaluation (Signed)
Physical Therapy Evaluation Patient Details Name: Thomas [name] MRN: [id number] DOB: May 06, 1927 Today's Date: 03/03/2021  History of Present Illness  Pt is a 88 y/o M presenting to ED on 1/25 from SNF after unwitnessed fall, c/o R hip and back pain. Found to have periprosthetic proximal femur fx. S/p ORIF of R femor on 1/27. PMH includes DM2, dyslipidemia, lung CA, prostate CA, and dementia.  Clinical Impression  Pt admitted with above diagnosis. Pt not very participatory and unable to assist with much during treatment. Used Maximove to get pt OOB to chair. Pt could not answer questions about SNF that he was at or his prior functional level.  Left comfortble sleeping in chair. Pt currently with functional limitations due to the deficits listed below (see PT Problem List). Pt will benefit from skilled PT to increase their independence and safety with mobility to allow discharge to the venue listed below.          Recommendations for follow up therapy are one component of a multi-disciplinary discharge planning process, led by the attending physician.  Recommendations may be updated based on patient status, additional functional criteria and insurance authorization.  Follow Up Recommendations Skilled nursing-short term rehab (<3 hours/day)    Assistance Recommended at Discharge Frequent or constant Supervision/Assistance  Patient can return home with the following  Two people to help with walking and/or transfers;Two people to help with bathing/dressing/bathroom    Equipment Recommendations None recommended by PT  Recommendations for Other Services       Functional Status Assessment Patient has had a recent decline in their functional status and/or demonstrates limited ability to make significant improvements in function in a reasonable and predictable amount of time     Precautions / Restrictions Precautions Precautions: Fall Restrictions Weight Bearing Restrictions: Yes RLE Weight  Bearing: Weight bearing as tolerated      Mobility  Bed Mobility Overal bed mobility: Needs Assistance Bed Mobility: Rolling Rolling: Total assist, +2 for physical assistance         General bed mobility comments: total assist to place maximove pad    Transfers Overall transfer level: Needs assistance   Transfers: Bed to chair/wheelchair/BSC       Squat pivot transfers: +2 physical assistance, Total assist     General transfer comment: pt able to roll R/L for pad placement prior to use of maximove Transfer via Lift Equipment: Maximove  Ambulation/Gait                  Stairs            Wheelchair Mobility    Modified Rankin (Stroke Patients Only)       Balance Overall balance assessment: Needs assistance Sitting-balance support: Feet supported Sitting balance-Leahy Scale: Poor Sitting balance - Comments: unable to sit unsupported at EOB                                     Pertinent Vitals/Pain Pain Assessment Pain Assessment: Faces Faces Pain Scale: Hurts whole lot Breathing: normal Negative Vocalization: none Facial Expression: smiling or inexpressive Body Language: relaxed Consolability: no need to console PAINAD Score: 0 Pain Location: R hip/RLE Pain Descriptors / Indicators: Constant, Discomfort, Grimacing, Guarding, Moaning, Operative site guarding Pain Intervention(s): Limited activity within patient's tolerance, Monitored during session, Repositioned    Home Living Family/patient expects to be discharged to:: Skilled nursing facility  Additional Comments: per chart review pt from [facility]    Prior Function Prior Level of Function : History of Falls (last six months);Patient poor historian/Family not available             Mobility Comments: pt questionable historian, reports use of RW for mobility       Hand Dominance   Dominant Hand: Right    Extremity/Trunk  Assessment   Upper Extremity Assessment Upper Extremity Assessment: Defer to OT evaluation    Lower Extremity Assessment Lower Extremity Assessment: RLE deficits/detail RLE: Unable to fully assess due to pain    Cervical / Trunk Assessment Cervical / Trunk Assessment: Kyphotic  Communication   Communication: HOH  Cognition Arousal/Alertness: Lethargic, Suspect due to medications Behavior During Therapy: Flat affect Overall Cognitive Status: History of cognitive impairments - at baseline                                          General Comments General comments (skin integrity, edema, etc.): VSS on RA    Exercises General Exercises - Lower Extremity Ankle Circles/Pumps: AROM, Both, 5 reps, Supine Heel Slides: AAROM, Right, 5 reps, Supine   Assessment/Plan    PT Assessment Patient needs continued PT services  PT Problem List Decreased activity tolerance;Decreased balance;Decreased mobility;Decreased knowledge of use of DME;Decreased safety awareness;Decreased knowledge of precautions;Pain       PT Treatment Interventions Gait training;DME instruction;Functional mobility training;Therapeutic activities;Therapeutic exercise;Balance training;Patient/family education    PT Goals (Current goals can be found in the Care Plan section)  Acute Rehab PT Goals Patient Stated Goal: unable to state PT Goal Formulation: Patient unable to participate in goal setting Time For Goal Achievement: 03/17/21 Potential to Achieve Goals: Fair    Frequency Min 3X/week     Co-evaluation PT/OT/SLP Co-Evaluation/Treatment: Yes Reason for Co-Treatment: Complexity of the patient's impairments (multi-system involvement);For patient/therapist safety PT goals addressed during session: Mobility/safety with mobility OT goals addressed during session: ADL's and self-care;Proper use of Adaptive equipment and DME       AM-PAC PT "6 Clicks" Mobility  Outcome Measure Help needed  turning from your back to your side while in a flat bed without using bedrails?: Total Help needed moving from lying on your back to sitting on the side of a flat bed without using bedrails?: Total Help needed moving to and from a bed to a chair (including a wheelchair)?: Total Help needed standing up from a chair using your arms (e.g., wheelchair or bedside chair)?: Total Help needed to walk in hospital room?: Total Help needed climbing 3-5 steps with a railing? : Total 6 Click Score: 6    End of Session Equipment Utilized During Treatment: Gait belt Activity Tolerance: Patient limited by fatigue;Patient limited by pain;Patient limited by lethargy Patient left: in chair;with call bell/phone within reach;with chair alarm set Nurse Communication: Mobility status;Need for lift equipment PT Visit Diagnosis: Unsteadiness on feet (R26.81);Muscle weakness (generalized) (M62.81);Pain Pain - Right/Left: Right Pain - part of body: Leg    Time: 8315-1761 PT Time Calculation (min) (ACUTE ONLY): 23 min   Charges:   PT Evaluation $PT Eval Moderate Complexity: 1 Mod          Gaudencio Chesnut M,PT Acute Rehab Services [phone] (pager)   Alvira Philips 03/03/2021, 3:18 PM

## 2021-03-03 NOTE — Evaluation (Signed)
Clinical/Bedside Swallow Evaluation Patient Details  Name: Thomas [name] MRN: [id number] Date of Birth: Jun 17, 1927  Today's Date: 03/03/2021 Time: SLP Start Time (ACUTE ONLY): 1518 SLP Stop Time (ACUTE ONLY): 1533 SLP Time Calculation (min) (ACUTE ONLY): 15 min  Past Medical History:  Past Medical History:  Diagnosis Date   Anginal pain ([facility])    Cancer ([place])    Diabetes mellitus    Hyperlipemia    Lung cancer ([place])    Prostate cancer [facility])    Past Surgical History:  Past Surgical History:  Procedure Laterality Date   BACK SURGERY     CARDIAC SURGERY     ESOPHAGOGASTRODUODENOSCOPY N/A 07/11/2016   Procedure: ESOPHAGOGASTRODUODENOSCOPY (EGD);  Surgeon: Rogene Houston, MD;  [facility];  Service: Endoscopy;  Laterality: N/A;   HIP ARTHROPLASTY Right 10/03/2016   Procedure: ARTHROPLASTY BIPOLAR HIP (HEMIARTHROPLASTY) with cabeling;  Surgeon: Renette Butters, MD;  Location: [place];  Service: Orthopedics;  Laterality: Right;   INTRAMEDULLARY (IM) NAIL INTERTROCHANTERIC Left 05/04/2016   Procedure: INTRAMEDULLARY (IM) NAIL INTERTROCHANTRIC;  Surgeon: Renette Butters, MD;  Location: [place];  Service: Orthopedics;  Laterality: Left;   PROSTATE SURGERY     surgery to repair aneurysm     TUMOR REMOVAL     from lung   HPI:  Pt is a 88 y/o M presenting to ED on 1/25 from SNF after unwitnessed fall, c/o R hip and back pain. Found to have periprosthetic proximal femur fx. S/p ORIF of R femor on 1/27. PMH includes DM2, dyslipidemia, lung CA, prostate CA, and dementia.    Assessment / Plan / Recommendation  Clinical Impression  Pt shows signs of a mild cognitively-based dysphagia, primarily exhibiting decreased awareness during bolus administration but with seemingly more appropriate oral preparation once initiated. He does not have teeth and denies wearing dentures. Would start a little more cautiously with Dys 2 (finely chopped) solids and thin liquids in light of altered  mentation and acute deconditioning. SLP will f/u acutely. Recommend SNF at discharge. SLP Visit Diagnosis: Dysphagia, unspecified (R13.10)    Aspiration Risk  Mild aspiration risk    Diet Recommendation Dysphagia 2 (Fine chop);Thin liquid   Liquid Administration via: Cup;Straw Medication Administration: Whole meds with puree Supervision: Staff to assist with self feeding Compensations: Minimize environmental distractions;Slow rate;Small sips/bites Postural Changes: Seated upright at 90 degrees    Other  Recommendations Oral Care Recommendations: Oral care BID    Recommendations for follow up therapy are one component of a multi-disciplinary discharge planning process, led by the attending physician.  Recommendations may be updated based on patient status, additional functional criteria and insurance authorization.  Follow up Recommendations Skilled nursing-short term rehab (<3 hours/day)      Assistance Recommended at Discharge Frequent or constant Supervision/Assistance  Functional Status Assessment Patient has had a recent decline in their functional status and demonstrates the ability to make significant improvements in function in a reasonable and predictable amount of time.  Frequency and Duration min 2x/week  2 weeks       Prognosis Prognosis for Safe Diet Advancement: Good Barriers to Reach Goals: Cognitive deficits      Swallow Study   General HPI: Pt is a 88 y/o M presenting to ED on 1/25 from SNF after unwitnessed fall, c/o R hip and back pain. Found to have periprosthetic proximal femur fx. S/p ORIF of R femor on 1/27. PMH includes DM2, dyslipidemia, lung CA, prostate CA, and dementia. Type of Study: Bedside Swallow  Evaluation Diet Prior to this Study: Regular;Thin liquids Temperature Spikes Noted: No Respiratory Status: Room air History of Recent Intubation: No Behavior/Cognition: Alert;Cooperative Oral Cavity Assessment: Within Functional Limits Oral Care  Completed by SLP: No Oral Cavity - Dentition: Edentulous Vision: Functional for self-feeding Self-Feeding Abilities: Needs assist Patient Positioning: Upright in bed Baseline Vocal Quality: Hoarse (mild) Volitional Cough: Weak Volitional Swallow: Able to elicit    Oral/Motor/Sensory Function Overall Oral Motor/Sensory Function: Within functional limits   Ice Chips Ice chips: Not tested   Thin Liquid Thin Liquid: Within functional limits Presentation: Straw    Nectar Thick Nectar Thick Liquid: Not tested   Honey Thick Honey Thick Liquid: Not tested   Puree Puree: Within functional limits Presentation: Spoon   Solid     Solid: Impaired Oral Phase Impairments: Other (comment) (prolonged mastication)      [name], M.A. [facility] Pager [phone] Office [phone]  03/03/2021,3:35 PM

## 2021-03-04 DIAGNOSIS — G9341 Metabolic encephalopathy: Secondary | ICD-10-CM

## 2021-03-04 DIAGNOSIS — D649 Anemia, unspecified: Secondary | ICD-10-CM

## 2021-03-04 LAB — CBC
HCT: 33.3 % — ABNORMAL LOW (ref 39.0–52.0)
Hemoglobin: 11.4 g/dL — ABNORMAL LOW (ref 13.0–17.0)
MCH: 31.1 pg (ref 26.0–34.0)
MCHC: 34.2 g/dL (ref 30.0–36.0)
MCV: 91 fL (ref 80.0–100.0)
Platelets: 165 10*3/uL (ref 150–400)
RBC: 3.66 MIL/uL — ABNORMAL LOW (ref 4.22–5.81)
RDW: 13.6 % (ref 11.5–15.5)
WBC: 9.8 10*3/uL (ref 4.0–10.5)
nRBC: 0 % (ref 0.0–0.2)

## 2021-03-04 LAB — RENAL FUNCTION PANEL
Albumin: 2.5 g/dL — ABNORMAL LOW (ref 3.5–5.0)
Anion gap: 7 (ref 5–15)
BUN: 30 mg/dL — ABNORMAL HIGH (ref 8–23)
CO2: 25 mmol/L (ref 22–32)
Calcium: 8.5 mg/dL — ABNORMAL LOW (ref 8.9–10.3)
Chloride: 106 mmol/L (ref 98–111)
Creatinine, Ser: 1.25 mg/dL — ABNORMAL HIGH (ref 0.61–1.24)
GFR, Estimated: 54 mL/min — ABNORMAL LOW (ref >60)
Glucose, Bld: 145 mg/dL — ABNORMAL HIGH (ref 70–99)
Phosphorus: 3.1 mg/dL (ref 2.5–4.6)
Potassium: 4.1 mmol/L (ref 3.5–5.1)
Sodium: 138 mmol/L (ref 135–145)

## 2021-03-04 LAB — GLUCOSE, CAPILLARY
Glucose-Capillary: 101 mg/dL — ABNORMAL HIGH (ref 70–99)
Glucose-Capillary: 97 mg/dL (ref 70–99)
Glucose-Capillary: 111 mg/dL — ABNORMAL HIGH (ref 70–99)
Glucose-Capillary: 120 mg/dL — ABNORMAL HIGH (ref 70–99)

## 2021-03-04 LAB — CORTISOL-AM, BLOOD: Cortisol - AM: 6.5 ug/dL — ABNORMAL LOW (ref 6.7–22.6)

## 2021-03-04 LAB — CK: Total CK: 238 U/L (ref 49–397)

## 2021-03-04 LAB — MAGNESIUM: Magnesium: 2 mg/dL (ref 1.7–2.4)

## 2021-03-04 LAB — AMMONIA: Ammonia: <10 umol/L (ref 9–35)

## 2021-03-04 MED ORDER — LORAZEPAM 2 MG/ML IJ SOLN: 1.0000 mg | Freq: Once | INTRAMUSCULAR | Status: DC

## 2021-03-04 MED ORDER — FENTANYL CITRATE PF 50 MCG/ML IJ SOSY
25.0000 ug | PREFILLED_SYRINGE | Freq: Four times a day (QID) | INTRAMUSCULAR | Status: DC | PRN
  Administered 2021-03-04 (×2): 25 ug via INTRAVENOUS
  Filled 2021-03-04 (×2): qty 1

## 2021-03-04 NOTE — Progress Notes (Addendum)
Brief patient description: 88 year old M with PMH of dementia, DM-2, CAD/CABG, lung cancer, prostate cancer and hyperlipidemia presented to [facility] admitted with rght periprosthetic femoral fracture s/p ORIF by Dr. Doreatha Martin on 03/02/2021.  Subjective: RN report that patient is agitated and confused, pulling out IV and throwing things on the floor. Requesting mechanical wrist restraints  Objective: No current vital signs on file, last documented BP was at 1247: he was afebrile with blood pressure 140/90 mm Hg and pulse rate 68 beats/min, RR 16 Sats 100% on R.A  Assessment Delirium- Likely multifactorial in the setting of hospitalization, recent surgery in a patient with underlying dementia.  -Will avoid restrainst which is likely to make his agitation worse -Avoid Benzos due to risk for paradoxical worsening of agitation -Patient has meds Zyprexa, Melatonin and Depakote scheduled for which is yet to receive. Will re-evalaute after he receive these medications     Rufina Falco, DNP, CCRN, FNP-C, AGACNP-BC Acute Care Nurse Practitioner  [facility] Pager: [phone] [facility]

## 2021-03-04 NOTE — Progress Notes (Addendum)
Orthopaedic Trauma Service Progress Note  Patient ID: Thomas [name] MRN: [id number] DOB/AGE: Jul 03, 1927 88 y.o.  Subjective:  Ortho issues stable   Trying to pull off gown   More awake today but still not verbalizing much   ROS As above  Objective:   VITALS:   Vitals:   03/03/21 1951 03/03/21 2239 03/04/21 0745 03/04/21 0831  BP:  112/62  (!) 115/44  Pulse: 78 81  (!) 52  Resp: 18 18  16   Temp:  97.9 F (36.6 C)  (!) 97.1 F (36.2 C)  TempSrc:  Oral  Axillary  SpO2: 98% 99% 99% 100%  Weight:      Height:        Estimated body mass index is 21.62 kg/m as calculated from the following:   Height as of this encounter: 5\' 11"  (1.803 m).   Weight as of this encounter: 70.3 kg.   Intake/Output      01/28 0701 01/29 0700 01/29 0701 01/30 0700   I.V. (mL/kg) 630 (9)    IV Piggyback     Total Intake(mL/kg) 630 (9)    Urine (mL/kg/hr) 1001 (0.6)    Blood     Total Output 1001    Net -371           LABS  Results for orders placed or performed during the hospital encounter of 02/28/21 (from the past 24 hour(s))  Glucose, capillary     Status: Abnormal   Collection Time: 03/03/21 11:20 AM  Result Value Ref Range   Glucose-Capillary 144 (H) 70 - 99 mg/dL  Glucose, capillary     Status: Abnormal   Collection Time: 03/03/21  3:57 PM  Result Value Ref Range   Glucose-Capillary 128 (H) 70 - 99 mg/dL  Glucose, capillary     Status: Abnormal   Collection Time: 03/03/21  8:54 PM  Result Value Ref Range   Glucose-Capillary 127 (H) 70 - 99 mg/dL  Renal function panel     Status: Abnormal   Collection Time: 03/04/21 12:48 AM  Result Value Ref Range   Sodium 138 135 - 145 mmol/L   Potassium 4.1 3.5 - 5.1 mmol/L   Chloride 106 98 - 111 mmol/L   CO2 25 22 - 32 mmol/L   Glucose, Bld 145 (H) 70 - 99 mg/dL   BUN 30 (H) 8 - 23 mg/dL   Creatinine, Ser 1.25 (H) 0.61 - 1.24 mg/dL   Calcium 8.5  (L) 8.9 - 10.3 mg/dL   Phosphorus 3.1 2.5 - 4.6 mg/dL   Albumin 2.5 (L) 3.5 - 5.0 g/dL   GFR, Estimated 54 (L) >60 mL/min   Anion gap 7 5 - 15  Magnesium     Status: None   Collection Time: 03/04/21 12:48 AM  Result Value Ref Range   Magnesium 2.0 1.7 - 2.4 mg/dL  CK     Status: None   Collection Time: 03/04/21 12:48 AM  Result Value Ref Range   Total CK 238 49 - 397 U/L  CBC     Status: Abnormal   Collection Time: 03/04/21 12:48 AM  Result Value Ref Range   WBC 9.8 4.0 - 10.5 K/uL   RBC 3.66 (L) 4.22 - 5.81 MIL/uL   Hemoglobin 11.4 (L) 13.0 - 17.0 g/dL   HCT 33.3 (L) 39.0 -  52.0 %   MCV 91.0 80.0 - 100.0 fL   MCH 31.1 26.0 - 34.0 pg   MCHC 34.2 30.0 - 36.0 g/dL   RDW 13.6 11.5 - 15.5 %   Platelets 165 150 - 400 K/uL   nRBC 0.0 0.0 - 0.2 %  Ammonia     Status: None   Collection Time: 03/04/21 12:48 AM  Result Value Ref Range   Ammonia <10 9 - 35 umol/L  Cortisol-am, blood     Status: Abnormal   Collection Time: 03/04/21 12:48 AM  Result Value Ref Range   Cortisol - AM 6.5 (L) 6.7 - 22.6 ug/dL  Glucose, capillary     Status: Abnormal   Collection Time: 03/04/21  8:26 AM  Result Value Ref Range   Glucose-Capillary 101 (H) 70 - 99 mg/dL     PHYSICAL EXAM:   Gen: resting comfortably in bed, frail elderly mail  Lungs: unlabored  Ext:       Right Lower Extremity              Dressing R hip clean, dry and intact             Ext warm   Pt moving R leg              Mild swelling              No DCT              exam otherwise stable    Assessment/Plan: 2 Days Post-Op   Principal Problem:   Right femoral fracture (HCC) Active Problems:   COPD (chronic obstructive pulmonary disease) (HCC)   Type 2 diabetes mellitus with hyperglycemia, with long-term current use of insulin (HCC)   Essential hypertension   Anemia   Anti-infectives (From admission, onward)    Start     Dose/Rate Route Frequency Ordered Stop   03/03/21 0600  ceFAZolin (ANCEF) IVPB 2g/100 mL  premix        2 g 200 mL/hr over 30 Minutes Intravenous Every 8 hours 03/03/21 0443 03/03/21 2316   03/02/21 1551  vancomycin (VANCOCIN) powder  Status:  Discontinued          As needed 03/02/21 1551 03/02/21 1610   03/02/21 1400  ceFAZolin (ANCEF) IVPB 2g/100 mL premix  Status:  Discontinued        2 g 200 mL/hr over 30 Minutes Intravenous On call to O.R. 03/02/21 1146 03/03/21 0442     .  POD/HD#: 105  88 year old male with complex medical history with nondisplaced right proximal femur periprosthetic fracture   -Ground-level fall   -Right proximal femur periprosthetic fracture s/p ORIF                WBAT R leg                Therapies                 Dressing changes as needed                Return to SNF once stable      - Pain management:               Multimodal, minimize narcotics               Schedule tylenol      - ABL anemia/Hemodynamics               Monitor  Stable  - Medical issues                Per primary   - DVT/PE prophylaxis              Lovenox    - ID:                Perioperative antibiotics   - Metabolic Bone Disease:               Pattern suggestive of fragility fracture               vitamin d levels look good at 42.41 ng/mL   - Dispo:               ortho issues stable                Therapies                  Snf once stable     Follow up with ortho in 10-14 days    Jari Pigg, PA-C [phone] (C) 03/04/2021, 11:17 AM  [facility] [place] [address] [phone] Jenetta [phone] (F)    After 5pm and on the weekends please log on to Amion, go to orthopaedics and the look under the [facility] Call for the provider(s) on call. You can also call our office at [phone] and then follow the prompts to be connected to the call team.   Patient ID: Thomas [name], male   DOB: 1928/01/27, 88 y.o.   MRN: [id number]

## 2021-03-04 NOTE — TOC Initial Note (Signed)
Transition of Care [facility]) - Initial/Assessment Note    Patient Details  Name: Thomas [name] MRN: [id number] Date of Birth: 1928-01-31  [facility] CM/SW Contact:    Milas Gain, [place] Phone Number: 03/04/2021, 10:05 AM  Clinical Narrative:                  CSW received consult for possible SNF placement at time of discharge. Patient not oriented.CSW spoke with patients legal guardian Chance regarding PT recommendation of SNF placement at time of discharge. Patients legal guardian reports patient comes from [facility] long term. Patients legal guardian expressed understanding of PT recommendation and is agreeable to patient returning to [facility] with rehab.CSW discussed insurance authorization process with patients legal guardian. Patients legal guardian reports patient has received the COVID vaccines as well as 1 booster. CSW followed up with Colletta [place] with [facility] who confirmed plan for patient to return with rehab when medically ready. No further questions reported at this time. CSW to continue to follow and assist with discharge planning needs.   Expected Discharge Plan: Skilled Nursing Facility Barriers to Discharge: Continued Medical Work up   Patient Goals and CMS Choice Patient states their goals for this hospitalization and ongoing recovery are:: SNF CMS Medicare.gov Compare Post Acute Care list provided to:: Legal Guardian (Chance) Choice offered to / list presented to : [place] / Guardian  Expected Discharge Plan and Services Expected Discharge Plan: [place] In-house Referral: Clinical Social Work   Post Acute Care Choice: Resumption of Svcs/PTA Provider Living arrangements for the past 2 months: [place]                                      Prior Living Arrangements/Services Living arrangements for the past 2 months: [place] Lives with:: Facility Resident (From [facility]) Patient  language and need for interpreter reviewed:: Yes Do you feel safe going back to the place where you live?: No   SNF (From [facility] will be going to rehab there)  Need for Family Participation in Patient Care: Yes (Comment) Care giver support system in place?: Yes (comment)   Criminal Activity/Legal Involvement Pertinent to Current Situation/Hospitalization: No - Comment as needed  Activities of Daily Living Home Assistive Devices/Equipment: None ADL Screening (condition at time of admission) Patient's cognitive ability adequate to safely complete daily activities?: No Is the patient deaf or have difficulty hearing?: No Does the patient have difficulty seeing, even when wearing glasses/contacts?: No Does the patient have difficulty concentrating, remembering, or making decisions?: Yes Patient able to express need for assistance with ADLs?: No Does the patient have difficulty dressing or bathing?: Yes Independently performs ADLs?: No Communication: Dependent Is this a change from baseline?: Pre-admission baseline Dressing (OT): Dependent Is this a change from baseline?: Pre-admission baseline Grooming: Dependent Is this a change from baseline?: Pre-admission baseline Feeding: Dependent Is this a change from baseline?: Pre-admission baseline Bathing: Dependent Is this a change from baseline?: Pre-admission baseline Toileting: Dependent Is this a change from baseline?: Pre-admission baseline In/Out Bed: Dependent Is this a change from baseline?: Pre-admission baseline Walks in Home: Dependent Is this a change from baseline?: Pre-admission baseline Does the patient have difficulty walking or climbing stairs?: No Weakness of Legs: None Weakness of Arms/Hands: None  Permission Sought/Granted Permission sought to share information with : Case Manager, Family Supports, Chartered certified accountant  granted to share information with : No  Share Information with NAME:  Patient not oriented spoke with patients legal guardian Chance  Permission granted to share info w AGENCY: Patient not oriented spoke with patients legal guardian Chance/SNF  Permission granted to share info w Relationship: Patient not oriented spoke with patients legal guardian Chance  Permission granted to share info w Contact Information: Patient not oriented spoke with patients legal guardian Chance [phone]  Emotional Assessment Appearance:: Appears stated age Attitude/Demeanor/Rapport: Unable to Assess Affect (typically observed): Unable to Assess Orientation: :  (Disoriented x4) Alcohol / Substance Use: Not Applicable Psych Involvement: No (comment)  Admission diagnosis:  Fracture [T14.8XXA] Fall [W19.XXXA] Right femoral fracture ([place]) [S72.91XA] Closed fracture of right hip, initial encounter ([place]) [S72.001A] Patient Active Problem List   Diagnosis Date Noted   Right femoral fracture ([place]) 03/01/2021   Femur fracture, right ([place]) 10/02/2016   Closed displaced fracture of right femoral neck ([place]) 10/02/2016   Anemia 07/13/2016   UGIB (upper gastrointestinal bleed)    Hematemesis 07/11/2016   Hemoptysis 07/10/2016   HCAP (healthcare-associated pneumonia) 07/10/2016   Protein-calorie malnutrition, severe 05/05/2016   Nondisplaced intertrochanteric fracture of left femur, initial encounter for closed fracture ([place]) 05/03/2016   Type 2 diabetes mellitus with hyperglycemia, with long-term current use of insulin ([place]) 05/03/2016   Type 1 diabetes mellitus with hyperlipidemia ([place]) 05/03/2016   Essential hypertension 05/03/2016   Altered mental status 07/06/2015   Normocytic anemia 07/06/2015   Hypotension 07/06/2015   Anxiety 07/06/2015   Arterial hypotension    Abnormal laboratory test result 11/01/2012   Epigastric pain 09/30/2012   NSTEMI (non-ST elevated myocardial infarction) ([place]) 09/30/2012   Acute renal failure ([place]) 09/09/2012   Acute encephalopathy 09/07/2012    Malignant neoplasm of upper lobe, bronchus or lung 02/19/2011   Right bundle branch block with left anterior fascicular block 03/31/2009   CHEST PAIN-UNSPECIFIED 03/31/2009   Insulin dependent diabetes mellitus 07/18/2008   HYPERLIPIDEMIA-MIXED 07/18/2008   CAD, ARTERY BYPASS GRAFT 07/18/2008   COPD (chronic obstructive pulmonary disease) ([place]) 07/18/2008   PCP:  Hilbert Corrigan, MD Pharmacy:   [place], [place] - [address] [place] [address] Phone: [phone] Fax: [phone]     Social Determinants of Health (SDOH) Interventions    Readmission Risk Interventions No flowsheet data found.

## 2021-03-04 NOTE — Progress Notes (Signed)
PROGRESS NOTE  Thomas [name] GXQ:[id number] DOB: 08/21/1927   PCP: Hilbert Corrigan, MD  Patient is from: SNF.  DOA: 02/28/2021 LOS: 3  Chief complaints:  Chief Complaint  Patient presents with   Fall     Brief Narrative / Interim history: 88 year old M with PMH of dementia, DM-2, CAD/CABG, lung cancer, prostate cancer and hyperlipidemia presented to [facility] with right hip pain and back pain after unwitnessed fall at SNF, and found to have right periprosthetic femoral fracture.  He underwent ORIF by Dr. Doreatha Martin on 03/02/2021.  CT lumbar spine, right knee and pelvic x-ray without acute finding.   Hospital course complicated by encephalopathy  Subjective: Seen and examined earlier this morning.  No major events overnight of this morning.  Sleepy but wakes to voice.  Follows some commands.  Not able to answer orientation questions or provide history.  Objective: Vitals:   03/03/21 1951 03/03/21 2239 03/04/21 0745 03/04/21 0831  BP:  112/62  (!) 115/44  Pulse: 78 81  (!) 52  Resp: 18 18  16   Temp:  97.9 F (36.6 C)  (!) 97.1 F (36.2 C)  TempSrc:  Oral  Axillary  SpO2: 98% 99% 99% 100%  Weight:      Height:        Examination:  GENERAL: No apparent distress.  Nontoxic. HEENT: MMM.  Vision and hearing grossly intact.  NECK: Supple.  No apparent JVD.  RESP: 100% on RA.  No IWOB.  Fair aeration bilaterally. CVS:  RRR. Heart sounds normal.  ABD/GI/GU: BS+. Abd soft, NTND.  MSK/EXT:  Moves extremities both upper extremities.  No BLE edema. SKIN: no apparent skin lesion or wound NEURO: Sleepy but wakes to voice.  Follows some commands.  No apparent focal neuro deficit but limited exam due to mental status. PSYCH: Calm. Normal affect.   Procedures:  1/27-ORIF of right periprosthetic femoral fracture  Microbiology summarized: COVID-19 and influenza PCR nonreactive.  Assessment & Plan: Unwitnessed fall at nursing home Right femoral periprosthetic fracture likely  due to fall -S/p ORIF by Dr. Doreatha Martin on 1/27. -WBAT per orthopedic surgery -Pain control and VTE prophylaxis per orthopedic surgery -PT/OT-recommended return to SNF.  Hypotension/history of hypertension: Improved.  A.m. cortisol low at 6.5 but suspect hypotension to be due to poor p.o. intake.  -Continue IV fluid -Discontinue Cardizem -Continue holding Imdur -Continue midodrine 5 mg 3 times daily.     NIDDM-2: A1c 6.2%.  Does not seem to be on medication for this. Recent Labs  Lab 03/03/21 0758 03/03/21 1120 03/03/21 1557 03/03/21 2054 03/04/21 0826  GLUCAP 157* 144* 128* 127* 101*  -Continue SSI   Chronic COPD: Stable. -Continue inhalers  Dyslipidemia:  -Atorvastatin 40 mg p.o. daily  Normocytic anemia: Slight drop in Hgb likely dilutional from IV fluid. Recent Labs    03/01/21 0349 03/02/21 0926 03/03/21 0217 03/03/21 0455 03/04/21 0048  HGB 13.3 12.9* 13.3 13.2 11.4*  -Recheck in the morning -Check anemia panel in the morning   Acute metabolic encephalopathy-multifactorial including iatrogenic from pain medication, delirium Dementia without behavioral disturbance:  -Reorientation delirium precautions -Continue home Zyprexa and Depakote -Discontinue oxycodone -Scheduled Tylenol with as needed fentanyl for breakthrough pain  Dysphagia: Likely from encephalopathy. -Continue dysphagia 2 diet per SLP  Increased nutrient needs Body mass index is 21.62 kg/m.  -Liberate diet -Continue Ensure       DVT prophylaxis:  enoxaparin (LOVENOX) injection 40 mg Start: 03/03/21 0800 SCDs Start: 03/03/21 0444 SCDs Start: 03/01/21 1448  Code Status: DNR/DNI Family Communication: Patient and/or RN. Available if any question.  Level of care: Med-Surg Status is: Inpatient  Remains inpatient appropriate because: Acute encephalopathy and postop care after ORIF   Final disposition: Likely back to SNF once medically stable.    Consultants:  Orthopedic  surgery   Sch Meds:  Scheduled Meds:  acetaminophen  1,000 mg Oral Q8H   atorvastatin  40 mg Oral Daily   bisacodyl  5 mg Oral Daily   diltiazem  120 mg Oral Daily   divalproex  375 mg Oral BID   docusate sodium  100 mg Oral BID   enoxaparin (LOVENOX) injection  40 mg Subcutaneous Q24H   feeding supplement  237 mL Oral BID WC   insulin aspart  0-5 Units Subcutaneous QHS   insulin aspart  0-9 Units Subcutaneous TID WC   melatonin  6 mg Oral QHS   midodrine  5 mg Oral TID WC   mometasone-formoterol  2 puff Inhalation BID   OLANZapine  5 mg Oral QHS   polyethylene glycol  17 g Oral Daily   umeclidinium bromide  1 puff Inhalation Daily   Continuous Infusions:  sodium chloride 100 mL/hr at 03/04/21 0512   PRN Meds:.fentaNYL (SUBLIMAZE) injection, food thickener, ipratropium-albuterol, LORazepam, metoCLOPramide **OR** metoCLOPramide (REGLAN) injection, ondansetron **OR** ondansetron (ZOFRAN) IV, oxyCODONE, polyethylene glycol  Antimicrobials: Anti-infectives (From admission, onward)    Start     Dose/Rate Route Frequency Ordered Stop   03/03/21 0600  ceFAZolin (ANCEF) IVPB 2g/100 mL premix        2 g 200 mL/hr over 30 Minutes Intravenous Every 8 hours 03/03/21 0443 03/03/21 2316   03/02/21 1551  vancomycin (VANCOCIN) powder  Status:  Discontinued          As needed 03/02/21 1551 03/02/21 1610   03/02/21 1400  ceFAZolin (ANCEF) IVPB 2g/100 mL premix  Status:  Discontinued        2 g 200 mL/hr over 30 Minutes Intravenous On call to O.R. 03/02/21 1146 03/03/21 0442        I have personally reviewed the following labs and images: CBC: Recent Labs  Lab 03/01/21 0349 03/02/21 0926 03/03/21 0217 03/03/21 0455 03/04/21 0048  WBC 8.1 7.0 8.5 9.6 9.8  NEUTROABS 5.3  --   --   --   --   HGB 13.3 12.9* 13.3 13.2 11.4*  HCT 40.5 39.5 38.5* 38.6* 33.3*  MCV 95.1 91.9 89.5 89.8 91.0  PLT 164 154 154 168 165   BMP &GFR Recent Labs  Lab 03/01/21 0349 03/02/21 0926  03/03/21 0217 03/03/21 0455 03/04/21 0048  NA 138 140 138 139 138  K 3.9 4.0 4.3 4.6 4.1  CL 109 107 106 107 106  CO2 22 24 20* 22 25  GLUCOSE 100* 101* 185* 169* 145*  BUN 25* 20 23 25* 30*  CREATININE 1.13 1.14 1.21 1.28* 1.25*  CALCIUM 8.8* 9.1 9.0 9.0 8.5*  MG  --   --  1.8  --  2.0  PHOS  --   --   --   --  3.1   Estimated Creatinine Clearance: 38.3 mL/min (A) (by C-G formula based on SCr of 1.25 mg/dL (H)). Liver & Pancreas: Recent Labs  Lab 03/02/21 0926 03/04/21 0048  AST 16  --   ALT 20  --   ALKPHOS 53  --   BILITOT 1.1  --   PROT 6.3*  --   ALBUMIN 3.1* 2.5*   No results for  input(s): LIPASE, AMYLASE in the last 168 hours. Recent Labs  Lab 03/04/21 0048  AMMONIA <10   Diabetic: No results for input(s): HGBA1C in the last 72 hours.  Recent Labs  Lab 03/03/21 0758 03/03/21 1120 03/03/21 1557 03/03/21 2054 03/04/21 0826  GLUCAP 157* 144* 128* 127* 101*   Cardiac Enzymes: Recent Labs  Lab 03/04/21 0048  CKTOTAL 238   No results for input(s): PROBNP in the last 8760 hours. Coagulation Profile: Recent Labs  Lab 03/02/21 0926  INR 1.2   Thyroid Function Tests: No results for input(s): TSH, T4TOTAL, FREET4, T3FREE, THYROIDAB in the last 72 hours. Lipid Profile: No results for input(s): CHOL, HDL, LDLCALC, TRIG, CHOLHDL, LDLDIRECT in the last 72 hours. Anemia Panel: No results for input(s): VITAMINB12, FOLATE, FERRITIN, TIBC, IRON, RETICCTPCT in the last 72 hours. Urine analysis:    Component Value Date/Time   COLORURINE YELLOW 10/01/2016 2326   APPEARANCEUR CLEAR 10/01/2016 2326   LABSPEC 1.015 10/01/2016 2326   PHURINE 5.0 10/01/2016 2326   GLUCOSEU NEGATIVE 10/01/2016 2326   HGBUR NEGATIVE 10/01/2016 2326   BILIRUBINUR NEGATIVE 10/01/2016 2326   KETONESUR NEGATIVE 10/01/2016 2326   PROTEINUR NEGATIVE 10/01/2016 2326   UROBILINOGEN 0.2 11/13/2012 1238   NITRITE NEGATIVE 10/01/2016 2326   LEUKOCYTESUR NEGATIVE 10/01/2016 2326    Sepsis Labs: Invalid input(s): PROCALCITONIN, [place]  Microbiology: Recent Results (from the past 240 hour(s))  Resp Panel by RT-PCR (Flu A&B, Covid) Nasopharyngeal Swab     Status: None   Collection Time: 03/01/21  2:34 AM   Specimen: Nasopharyngeal Swab; Nasopharyngeal(NP) swabs in vial transport medium  Result Value Ref Range Status   SARS Coronavirus 2 by RT PCR NEGATIVE NEGATIVE Final    Comment: (NOTE) SARS-CoV-2 target nucleic acids are NOT DETECTED.  The SARS-CoV-2 RNA is generally detectable in upper respiratory specimens during the acute phase of infection. The lowest concentration of SARS-CoV-2 viral copies this assay can detect is 138 copies/mL. A negative result does not preclude SARS-Cov-2 infection and should not be used as the sole basis for treatment or other patient management decisions. A negative result may occur with  improper specimen collection/handling, submission of specimen other than nasopharyngeal swab, presence of viral mutation(s) within the areas targeted by this assay, and inadequate number of viral copies(<138 copies/mL). A negative result must be combined with clinical observations, patient history, and epidemiological information. The expected result is Negative.  Fact Sheet for Patients:  EntrepreneurPulse.com.au  Fact Sheet for Healthcare Providers:  IncredibleEmployment.be  This test is no t yet approved or cleared by the [place] FDA and  has been authorized for detection and/or diagnosis of SARS-CoV-2 by FDA under an Emergency Use Authorization (EUA). This EUA will remain  in effect (meaning this test can be used) for the duration of the COVID-19 declaration under Section 564(b)(1) of the Act, 21 U.S.C.section 360bbb-3(b)(1), unless the authorization is terminated  or revoked sooner.       Influenza A by PCR NEGATIVE NEGATIVE Final   Influenza B by PCR NEGATIVE NEGATIVE Final     Comment: (NOTE) The Xpert Xpress SARS-CoV-2/FLU/RSV plus assay is intended as an aid in the diagnosis of influenza from Nasopharyngeal swab specimens and should not be used as a sole basis for treatment. Nasal washings and aspirates are unacceptable for Xpert Xpress SARS-CoV-2/FLU/RSV testing.  Fact Sheet for Patients: EntrepreneurPulse.com.au  Fact Sheet for Healthcare Providers: IncredibleEmployment.be  This test is not yet approved or cleared by the [place] and has been authorized  for detection and/or diagnosis of SARS-CoV-2 by FDA under an Emergency Use Authorization (EUA). This EUA will remain in effect (meaning this test can be used) for the duration of the COVID-19 declaration under Section 564(b)(1) of the Act, 21 U.S.C. section 360bbb-3(b)(1), unless the authorization is terminated or revoked.  Performed at [facility], [address], [place], [place] [address]   Surgical pcr screen     Status: Abnormal   Collection Time: 03/02/21 11:23 AM   Specimen: Nasal Mucosa; Nasal Swab  Result Value Ref Range Status   MRSA, PCR POSITIVE (A) NEGATIVE Final    Comment: RESULT CALLED TO, READ BACK BY AND VERIFIED WITH: RN T Tamala Julian [phone] AT 1340 BY CM    Staphylococcus aureus POSITIVE (A) NEGATIVE Final    Comment: (NOTE) The Xpert SA Assay (FDA approved for NASAL specimens in patients 44 years of age and older), is one component of a comprehensive surveillance program. It is not intended to diagnose infection nor to guide or monitor treatment. Performed at [facility], [place] [address], [place], [place] [address]     Radiology Studies: No results found.     Manroop Jakubowicz T. Henning  If 7PM-7AM, please contact night-coverage [email or web address] 03/04/2021, 12:05 PM

## 2021-03-04 NOTE — NC FL2 (Signed)
[place] LEVEL OF CARE SCREENING TOOL     IDENTIFICATION  Patient Name: Thomas [name] Birthdate: [birth year] Sex: male Admission Date (Current Location): 02/28/2021  [facility] and [place] Number:  Herbalist and Address:  [facility] [place]. [facility], [place] [address] [place], [place] [address]      Provider Number: [id number]  Attending Physician Name and Address:  Mercy Riding, MD  Relative Name and Phone Number:  Chance (Legal Guardian [phone]    Current Level of Care: Hospital Recommended Level of Care: [place] Prior Approval Number:    Date Approved/Denied:   PASRR Number: [id number]  Discharge Plan: SNF    Current Diagnoses: Patient Active Problem List   Diagnosis Date Noted   Right femoral fracture ([place]) 03/01/2021   Femur fracture, right ([place]) 10/02/2016   Closed displaced fracture of right femoral neck ([place]) 10/02/2016   Anemia 07/13/2016   UGIB (upper gastrointestinal bleed)    Hematemesis 07/11/2016   Hemoptysis 07/10/2016   HCAP (healthcare-associated pneumonia) 07/10/2016   Protein-calorie malnutrition, severe 05/05/2016   Nondisplaced intertrochanteric fracture of left femur, initial encounter for closed fracture ([place]) 05/03/2016   Type 2 diabetes mellitus with hyperglycemia, with long-term current use of insulin ([place]) 05/03/2016   Type 1 diabetes mellitus with hyperlipidemia ([place]) 05/03/2016   Essential hypertension 05/03/2016   Altered mental status 07/06/2015   Normocytic anemia 07/06/2015   Hypotension 07/06/2015   Anxiety 07/06/2015   Arterial hypotension    Abnormal laboratory test result 11/01/2012   Epigastric pain 09/30/2012   NSTEMI (non-ST elevated myocardial infarction) ([place]) 09/30/2012   Acute renal failure ([place]) 09/09/2012   Acute encephalopathy 09/07/2012   Malignant neoplasm of upper lobe, bronchus or lung 02/19/2011   Right bundle branch block with left anterior fascicular  block 03/31/2009   CHEST PAIN-UNSPECIFIED 03/31/2009   Insulin dependent diabetes mellitus 07/18/2008   HYPERLIPIDEMIA-MIXED 07/18/2008   CAD, ARTERY BYPASS GRAFT 07/18/2008   COPD (chronic obstructive pulmonary disease) ([place]) 07/18/2008    Orientation RESPIRATION BLADDER Height & Weight      (Disoriented x4)  Normal Incontinent, External catheter (External Urinary Catheter) Weight: 154 lb 15.7 oz (70.3 kg) Height:  5\' 11"  (180.3 cm)  BEHAVIORAL SYMPTOMS/MOOD NEUROLOGICAL BOWEL NUTRITION STATUS      Incontinent Diet (Please see discharge summary)  AMBULATORY STATUS COMMUNICATION OF NEEDS Skin   Total Care Verbally Other (Comment) (Appropriate for ethnicity,dry,Abrasion leg,R,Incision closed,leg R,silicone dressing,clean,intact,dry,dressing in place)                       Personal Care Assistance Level of Assistance  Bathing, Feeding, Dressing Bathing Assistance: Maximum assistance Feeding assistance: Maximum assistance Dressing Assistance: Maximum assistance     Functional Limitations Info             SPECIAL CARE FACTORS FREQUENCY  PT (By licensed PT), OT (By licensed OT)     PT Frequency: 5x min weekly OT Frequency: 5x min weekly            Contractures Contractures Info: Not present    Additional Factors Info  Code Status, Allergies, Psychotropic, Insulin Sliding Scale Code Status Info: DNR Allergies Info: Nubain (nalbuphine Hcl) Psychotropic Info: divalproex (DEPAKOTE) DR tablet 375 mg 2 times daily,OLANZapine (ZYPREXA) tablet 5 mg daily at bedtime Insulin Sliding Scale Info: insulin aspart (novoLOG) injection 0-5 Units daily at bedtime,insulin aspart (novoLOG) injection 0-9 Units 3 times daily with meals  Current Medications (03/04/2021):  This is the current hospital active medication list Current Facility-Administered Medications  Medication Dose Route Frequency Provider Last Rate Last Admin   0.9 %  sodium chloride infusion   Intravenous  Continuous Corinne Ports, PA-C 100 mL/hr at 03/04/21 0512 New Bag at 03/04/21 0512   acetaminophen (TYLENOL) tablet 1,000 mg  1,000 mg Oral Q8H Wendee Beavers T, MD   1,000 mg at 03/04/21 0516   atorvastatin (LIPITOR) tablet 40 mg  40 mg Oral Daily Corinne Ports, PA-C   40 mg at 03/03/21 1332   bisacodyl (DULCOLAX) EC tablet 5 mg  5 mg Oral Daily Corinne Ports, PA-C   5 mg at 03/03/21 1332   diltiazem (CARDIZEM CD) 24 hr capsule 120 mg  120 mg Oral Daily Wendee Beavers T, MD   120 mg at 03/03/21 1332   divalproex (DEPAKOTE) DR tablet 375 mg  375 mg Oral BID Corinne Ports, PA-C   375 mg at 03/03/21 2241   docusate sodium (COLACE) capsule 100 mg  100 mg Oral BID Corinne Ports, PA-C   100 mg at 03/03/21 2240   enoxaparin (LOVENOX) injection 40 mg  40 mg Subcutaneous Q24H Corinne Ports, PA-C   40 mg at 03/03/21 0805   feeding supplement (ENSURE ENLIVE / ENSURE PLUS) liquid 237 mL  237 mL Oral BID WC Rushie Nyhan A, PA-C   237 mL at 03/03/21 1710   fentaNYL (SUBLIMAZE) injection 12.5 mcg  12.5 mcg Intravenous Q2H PRN Mercy Riding, MD       food thickener (SIMPLYTHICK (NECTAR/LEVEL 2/MILDLY THICK)) 1 packet  1 packet Oral PRN Rushie Nyhan A, PA-C       insulin aspart (novoLOG) injection 0-5 Units  0-5 Units Subcutaneous QHS McClung, Sarah A, PA-C       insulin aspart (novoLOG) injection 0-9 Units  0-9 Units Subcutaneous TID WC Corinne Ports, PA-C   1 Units at 03/03/21 1701   ipratropium-albuterol (DUONEB) 0.5-2.5 (3) MG/3ML nebulizer solution 3 mL  3 mL Nebulization Q6H PRN Corinne Ports, PA-C       LORazepam (ATIVAN) injection 0.5 mg  0.5 mg Intravenous Q6H PRN Corinne Ports, PA-C   0.5 mg at 03/03/21 2320   melatonin tablet 6 mg  6 mg Oral QHS Rushie Nyhan A, PA-C   6 mg at 03/03/21 2240   metoCLOPramide (REGLAN) tablet 5-10 mg  5-10 mg Oral Q8H PRN Corinne Ports, PA-C       Or   metoCLOPramide (REGLAN) injection 5-10 mg  5-10 mg Intravenous Q8H PRN McClung, Sarah A, PA-C        midodrine (PROAMATINE) tablet 5 mg  5 mg Oral TID WC Gonfa, Taye T, MD   5 mg at 03/03/21 1724   mometasone-formoterol (DULERA) 200-5 MCG/ACT inhaler 2 puff  2 puff Inhalation BID Corinne Ports, PA-C   2 puff at 03/04/21 0743   OLANZapine (ZYPREXA) tablet 5 mg  5 mg Oral QHS Rushie Nyhan A, PA-C   5 mg at 03/03/21 2240   ondansetron (ZOFRAN) tablet 4 mg  4 mg Oral Q6H PRN Corinne Ports, PA-C       Or   ondansetron (ZOFRAN) injection 4 mg  4 mg Intravenous Q6H PRN McClung, Sarah A, PA-C       oxyCODONE (Oxy IR/ROXICODONE) immediate release tablet 5 mg  5 mg Oral Q8H PRN Mercy Riding, MD       polyethylene glycol (  MIRALAX / GLYCOLAX) packet 17 g  17 g Oral Daily Corinne Ports, PA-C   17 g at 03/01/21 1834   polyethylene glycol (MIRALAX / GLYCOLAX) packet 17 g  17 g Oral Daily PRN Corinne Ports, PA-C       umeclidinium bromide (INCRUSE ELLIPTA) 62.5 MCG/ACT 1 puff  1 puff Inhalation Daily Corinne Ports, PA-C   1 puff at 03/04/21 4888   Facility-Administered Medications Ordered in Other Encounters  Medication Dose Route Frequency Provider Last Rate Last Admin   albuterol (PROVENTIL) (5 MG/ML) 0.5% nebulizer solution 2.5 mg  2.5 mg Nebulization Once Lendon Colonel, NP         Discharge Medications: Please see discharge summary for a list of discharge medications.  Relevant Imaging Results:  Relevant Lab Results:   Additional Information [id number] Both Covid Vaccines,1 Booster  [place], [place]

## 2021-03-05 ENCOUNTER — Encounter (HOSPITAL_COMMUNITY): Payer: Self-pay | Admitting: Student

## 2021-03-05 DIAGNOSIS — F03918 Unspecified dementia, unspecified severity, with other behavioral disturbance: Secondary | ICD-10-CM

## 2021-03-05 LAB — RENAL FUNCTION PANEL
Albumin: 2.7 g/dL — ABNORMAL LOW (ref 3.5–5.0)
Anion gap: 10 (ref 5–15)
BUN: 20 mg/dL (ref 8–23)
CO2: 23 mmol/L (ref 22–32)
Calcium: 8.9 mg/dL (ref 8.9–10.3)
Chloride: 107 mmol/L (ref 98–111)
Creatinine, Ser: 0.99 mg/dL (ref 0.61–1.24)
GFR, Estimated: >60 mL/min (ref >60)
Glucose, Bld: 143 mg/dL — ABNORMAL HIGH (ref 70–99)
Phosphorus: 2.8 mg/dL (ref 2.5–4.6)
Potassium: 4 mmol/L (ref 3.5–5.1)
Sodium: 140 mmol/L (ref 135–145)

## 2021-03-05 LAB — IRON AND TIBC
Iron: 23 ug/dL — ABNORMAL LOW (ref 45–182)
Saturation Ratios: 10 % — ABNORMAL LOW (ref 17.9–39.5)
TIBC: 224 ug/dL — ABNORMAL LOW (ref 250–450)
UIBC: 201 ug/dL

## 2021-03-05 LAB — CBC
HCT: 35.1 % — ABNORMAL LOW (ref 39.0–52.0)
Hemoglobin: 11.9 g/dL — ABNORMAL LOW (ref 13.0–17.0)
MCH: 30.9 pg (ref 26.0–34.0)
MCHC: 33.9 g/dL (ref 30.0–36.0)
MCV: 91.2 fL (ref 80.0–100.0)
Platelets: 176 10*3/uL (ref 150–400)
RBC: 3.85 MIL/uL — ABNORMAL LOW (ref 4.22–5.81)
RDW: 13.5 % (ref 11.5–15.5)
WBC: 7.8 10*3/uL (ref 4.0–10.5)
nRBC: 0 % (ref 0.0–0.2)

## 2021-03-05 LAB — RETICULOCYTES
Immature Retic Fract: 20 % — ABNORMAL HIGH (ref 2.3–15.9)
RBC.: 3.86 MIL/uL — ABNORMAL LOW (ref 4.22–5.81)
Retic Count, Absolute: 74.1 10*3/uL (ref 19.0–186.0)
Retic Ct Pct: 1.9 % (ref 0.4–3.1)

## 2021-03-05 LAB — GLUCOSE, CAPILLARY
Glucose-Capillary: 91 mg/dL (ref 70–99)
Glucose-Capillary: 108 mg/dL — ABNORMAL HIGH (ref 70–99)
Glucose-Capillary: 129 mg/dL — ABNORMAL HIGH (ref 70–99)
Glucose-Capillary: 128 mg/dL — ABNORMAL HIGH (ref 70–99)

## 2021-03-05 LAB — VITAMIN D 25 HYDROXY (VIT D DEFICIENCY, FRACTURES): Vit D, 25-Hydroxy: 51.29 ng/mL (ref 30–100)

## 2021-03-05 LAB — VITAMIN B12: Vitamin B-12: 359 pg/mL (ref 180–914)

## 2021-03-05 LAB — RESP PANEL BY RT-PCR (FLU A&B, COVID) ARPGX2
Influenza A by PCR: NEGATIVE
Influenza B by PCR: NEGATIVE
SARS Coronavirus 2 by RT PCR: NEGATIVE

## 2021-03-05 LAB — TSH: TSH: 2.069 u[IU]/mL (ref 0.350–4.500)

## 2021-03-05 LAB — FOLATE: Folate: 4.4 ng/mL — ABNORMAL LOW (ref >5.9)

## 2021-03-05 LAB — FERRITIN: Ferritin: 135 ng/mL (ref 24–336)

## 2021-03-05 LAB — MAGNESIUM: Magnesium: 1.9 mg/dL (ref 1.7–2.4)

## 2021-03-05 MED ORDER — ENSURE ENLIVE PO LIQD: 237.0000 mL | Freq: Two times a day (BID) | ORAL | 0 refills | Status: AC

## 2021-03-05 MED ORDER — DIVALPROEX SODIUM 250 MG PO DR TAB
375.0000 mg | DELAYED_RELEASE_TABLET | Freq: Two times a day (BID) | ORAL | Status: DC
  Administered 2021-03-05 – 2021-03-06 (×2): 375 mg via ORAL
  Filled 2021-03-05 (×2): qty 1

## 2021-03-05 MED ORDER — POLYETHYLENE GLYCOL 3350 17 GM/SCOOP PO POWD: 17.0000 g | Freq: Two times a day (BID) | ORAL | 0 refills | Status: AC | PRN

## 2021-03-05 MED ORDER — ACETAMINOPHEN 500 MG PO TABS: 1000.0000 mg | ORAL_TABLET | Freq: Three times a day (TID) | ORAL | 0 refills | Status: AC | PRN

## 2021-03-05 MED ORDER — ASPIRIN 325 MG PO TABS: 325.0000 mg | ORAL_TABLET | Freq: Two times a day (BID) | ORAL | 0 refills | Status: AC

## 2021-03-05 MED ORDER — FOLIC ACID 1 MG PO TABS
1.0000 mg | ORAL_TABLET | Freq: Every day | ORAL | Status: DC
  Administered 2021-03-05 – 2021-03-06 (×2): 1 mg via ORAL
  Filled 2021-03-05 (×2): qty 1

## 2021-03-05 MED ORDER — MIDODRINE HCL 5 MG PO TABS: 5.0000 mg | ORAL_TABLET | Freq: Three times a day (TID) | ORAL | Status: AC

## 2021-03-05 MED ORDER — OLANZAPINE 5 MG PO TABS: 5.0000 mg | ORAL_TABLET | Freq: Every day | ORAL | Status: DC

## 2021-03-05 MED ORDER — OLANZAPINE 5 MG PO TABS
5.0000 mg | ORAL_TABLET | Freq: Every day | ORAL | Status: DC
  Administered 2021-03-05: 5 mg via ORAL
  Filled 2021-03-05: qty 1

## 2021-03-05 NOTE — Discharge Summary (Signed)
Physician Discharge Summary  Thomas [name] WHQ:[id number] DOB: [birth year] DOA: 02/28/2021  PCP: Hilbert Corrigan, MD  Admit date: 02/28/2021 Discharge date: 03/05/2021 Admitted From: SNF Disposition: SNF Recommendations for Outpatient Follow-up:  Follow ups as below. Recommend palliative follow-up at SNF Please obtain CBC/BMP/Mag at follow up Please follow up on the following pending results: None  Discharge Condition: Stable CODE STATUS: DNR/DNI  Follow-up Information     Haddix, Thomasene Lot, MD. Schedule an appointment as soon as possible for a visit in 2 week(s).   Specialty: Orthopedic Surgery Contact information: [place] [address] [phone]                Hospital Course: 88 year old M with PMH of dementia, DM-2, CAD/CABG, lung cancer, prostate cancer and hyperlipidemia presented to [facility] with right hip pain and back pain after unwitnessed fall at SNF, and found to have right periprosthetic femoral fracture.  He underwent ORIF by Dr. Doreatha Martin on 03/02/2021.  CT lumbar spine, right knee and pelvic x-ray without acute finding.  He is discharged on Tylenol for pain control and full dose aspirin for VTE prophylaxis per orthopedic surgery.  Outpatient follow-up with orthopedic surgery in 2 weeks.  Hospital course complicated by delirium/confusion that has improved.  May need assistant with feeding for optimal nutrition and hydration.  See individual problem list below for more on hospital course.  Discharge Diagnoses:  Unwitnessed fall at nursing home Right femoral periprosthetic fracture likely due to fall -S/p ORIF by Dr. Doreatha Martin on 1/27. -WBAT per orthopedic surgery -Tylenol as needed for pain control -Full dose aspirin for VTE prophylaxis -Outpatient follow-up with orthopedic surgery in 2 weeks. -Continue PT/OT   Hypotension/history of hypertension: Resolved.  A.m. cortisol low at 6.5 but suspect hypotension to be due to poor p.o.  intake.  -Discontinued home Cardizem and Imdur. -Continue midodrine 5 mg 3 times daily.  Dementia with behavioral disturbance/delirium-improved.  He is oriented to self and follows command.  No focal neurodeficit. -Reorientation delirium precautions -Continue home Zyprexa and Depakote -May need assistant with feeding to ensure optimal nutrition and hydration -Recommend palliative follow-up at SNF.     NIDDM-2: A1c 6.2%.  Does not seem to be on medication for this. -Liberate diet.  Chronic COPD: Stable. -Continue inhalers   Dyslipidemia:  -Discontinued statin.  Doubt benefit given his age   Normocytic anemia: Slight drop in Hgb likely dilutional from IV fluid. -Recheck CBC in 1 week   Dysphagia: Likely from encephalopathy. -Aspiration precaution   Increased nutrient needs Body mass index is 21.62 kg/m. -Liberate diet -Continue initial -Assistance with feeding to ensure optimal nutrition and hydration.  Body mass index is 21.62 kg/m.           Discharge Exam: Vitals:   03/04/21 2226 03/05/21 0500 03/05/21 0845 03/05/21 0846  BP: (!) 98/37 (!) 129/52    Pulse: 89 61    Temp: 97.8 F (36.6 C) 97.6 F (36.4 C)    Resp: 18 16    Height:      Weight:      SpO2: 98% 97% 96% 96%  TempSrc: Oral Axillary    BMI (Calculated):         GENERAL: No apparent distress.  Nontoxic. HEENT: MMM.  Vision and hearing grossly intact.  NECK: Supple.  No apparent JVD.  RESP: 96% on RA.  No IWOB.  Fair aeration bilaterally. CVS:  RRR. Heart sounds normal.  ABD/GI/GU: Bowel sounds present. Soft. Non tender.  MSK/EXT:  Moves extremities. No apparent deformity. No edema.  SKIN: Surgical wound DCI. NEURO: Awake and alert.  Oriented to self.  Follows some commands.  No apparent focal neuro deficit. PSYCH: Calm. Normal affect.   Discharge Instructions  Discharge Instructions     Diet general   Complete by: As directed    Increase activity slowly   Complete by: As directed     No wound care   Complete by: As directed       Allergies as of 03/05/2021       Reactions   Nubain [nalbuphine Hcl]    Listed on MAR-no reaction is noted        Medication List     STOP taking these medications    diltiazem 120 MG 24 hr capsule Commonly known as: CARDIZEM CD   isosorbide mononitrate 60 MG 24 hr tablet Commonly known as: IMDUR       TAKE these medications    acetaminophen 500 MG tablet Commonly known as: TYLENOL Take 2 tablets (1,000 mg total) by mouth every 8 (eight) hours as needed for moderate pain. What changed: how much to take   aspirin 325 MG tablet Take 1 tablet (325 mg total) by mouth in the morning and at bedtime.   budesonide-formoterol 160-4.5 MCG/ACT inhaler Commonly known as: SYMBICORT Inhale 2 puffs into the lungs 2 (two) times daily.   divalproex 125 MG DR tablet Commonly known as: DEPAKOTE Take 375 mg by mouth 2 (two) times daily.   feeding supplement Liqd Take 237 mLs by mouth 2 (two) times daily with a meal.   Incruse Ellipta 62.5 MCG/ACT Aepb Generic drug: umeclidinium bromide Inhale 1 puff into the lungs daily.   lidocaine 5 % Commonly known as: LIDODERM Place 1 patch onto the skin daily. Apply to lower back. Remove & Discard patch within 12 hours or as directed by MD   MELATONIN PO Take 6 mg by mouth at bedtime.   midodrine 5 MG tablet Commonly known as: PROAMATINE Take 1 tablet (5 mg total) by mouth 3 (three) times daily with meals.   multivitamin-lutein Caps capsule Take 1 capsule by mouth in the morning and at bedtime.   nitroGLYCERIN 0.4 MG SL tablet Commonly known as: NITROSTAT Place 0.4 mg under the tongue every 5 (five) minutes x 3 doses as needed for chest pain.   OLANZapine 5 MG tablet Commonly known as: ZYPREXA Take 5 mg by mouth at bedtime.   polyethylene glycol powder 17 GM/SCOOP powder Commonly known as: MiraLax Take 17 g by mouth 2 (two) times daily as needed for moderate constipation.    senna 8.6 MG Tabs tablet Commonly known as: SENOKOT Take 1 tablet (8.6 mg total) by mouth 2 (two) times daily as needed for mild constipation. What changed: when to take this   Vitamin D (Ergocalciferol) 1.25 MG (50000 UNIT) Caps capsule Commonly known as: DRISDOL Take 50,000 Units by mouth every 30 (thirty) days. 27th of each month        Consultations: Orthopedic surgery  Procedures/Studies: 1/27-ORIF of right periprosthetic femoral fracture   CT Lumbar Spine Wo Contrast  Result Date: 03/01/2021 CLINICAL DATA:  Back trauma, unwitnessed fall EXAM: CT LUMBAR SPINE WITHOUT CONTRAST TECHNIQUE: Multidetector CT imaging of the lumbar spine was performed without intravenous contrast administration. Multiplanar CT image reconstructions were also generated. RADIATION DOSE REDUCTION: This exam was performed according to the departmental dose-optimization program which includes automated exposure control, adjustment of the mA and/or kV according to  patient size and/or use of iterative reconstruction technique. COMPARISON:  No prior lumbar spine CT, correlation is made with CT abdomen pelvis 07/10/2016 and lumbar spine MRI 03/17/2009. FINDINGS: Segmentation: 5 lumbar type vertebrae. Alignment: Unchanged mild retrolisthesis L5 on S1, unchanged. No acute listhesis. Mild straightening of the normal lumbar lordosis. Vertebrae: No acute fracture or focal pathologic lesion. Redemonstrated compression deformity of L1, status post kyphoplasty, which appears unchanged compared to 2018. Osseous fusion across L5-S1. Degenerative osseous fusion across the left sacroiliac joint. Paraspinal and other soft tissues: Aortic atherosclerosis. Disc levels: No significant degenerative changes at T12-L4. L4-L5: Broad-based disc bulge. Mild-to-moderate facet arthropathy. Ligamentum flavum hypertrophy. No definite spinal canal stenosis. Mild bilateral neural foraminal narrowing. L5-S1: Osseous fusion with fixed  retrolisthesis. Moderate facet arthropathy. No spinal canal stenosis. Mild left-greater-than-right neural foraminal narrowing. IMPRESSION: 1. No acute fracture or traumatic listhesis. 2. Unchanged appearance of L1 compression deformity, status post kyphoplasty. 3. Degenerative changes in the lower lumbar spine and sacrum, with mild neural foraminal narrowing at L4-L5 and L5-S1. Electronically Signed   By: Merilyn Baba M.D.   On: 03/01/2021 02:01   CT Hip Right Wo Contrast  Result Date: 03/01/2021 CLINICAL DATA:  Trauma. EXAM: CT OF THE RIGHT HIP WITHOUT CONTRAST TECHNIQUE: Multidetector CT imaging of the right hip was performed according to the standard protocol. Multiplanar CT image reconstructions were also generated. RADIATION DOSE REDUCTION: This exam was performed according to the departmental dose-optimization program which includes automated exposure control, adjustment of the mA and/or kV according to patient size and/or use of iterative reconstruction technique. COMPARISON:  Right hip x-ray 03/01/2021. CT abdomen and pelvis 07/10/2016. FINDINGS: Bones/Joint/Cartilage There is a right hip arthroplasty in anatomic alignment. There is no evidence for hardware loosening. Or proximal portion of fracture is near the cerclage wire and distal aspect of the fracture is 2.5 cm distal to the tip of the femoral component of the arthroplasty. There is a healed right inferior pubic ramus fracture. No dislocation. Ligaments Suboptimally assessed by CT. Muscles and Tendons Grossly within normal limits. Limited evaluation secondary to streak artifact. Soft tissues Grossly within normal limits. Limited evaluation secondary to streak artifact. IMPRESSION: 1. Acute nondisplaced oblique fracture of the proximal femoral diaphysis at and below the level of the femoral component of the hip arthroplasty. 2. Right hip arthroplasty in anatomic alignment. Electronically Signed   By: Ronney Asters M.D.   On: 03/01/2021 02:01    DG C-Arm 1-60 Min-No Report  Result Date: 03/02/2021 Fluoroscopy was utilized by the requesting physician.  No radiographic interpretation.   DG HIP UNILAT WITH PELVIS 2-3 VIEWS RIGHT  Result Date: 03/01/2021 CLINICAL DATA:  Unwitnessed fall.  Right hip and back pain. EXAM: DG HIP (WITH OR WITHOUT PELVIS) 2-3V RIGHT COMPARISON:  Similar study 10/04/2016. FINDINGS: Generalized osteopenia. Right hip arthroplasty is again noted without evidence of significant loosening, dislocation of the prosthesis, or perihardware fractures. On the left, an old hip nailing is again noted and there is increased flattening of the superior femoral head consistent with avascular necrosis with moderate secondary DJD also increased. There are numerous surgical clips along the pelvic sidewalls. Ankylosis of portions of both SI joints. Pelvic enthesopathy is again shown as well.  Penile implant. IMPRESSION: 1. Osteopenia, degenerative and postsurgical change. 2. No evidence of fractures. 3. Increased superior left femoral head flattening consistent with interval avascular necrosis with articular surface subsidence, moderate secondary DJD. Electronically Signed   By: Telford Nab M.D.   On: 03/01/2021 00:49  DG FEMUR, MIN 2 VIEWS RIGHT  Result Date: 03/02/2021 CLINICAL DATA:  ORIF proximal right femur EXAM: RIGHT FEMUR 2 VIEWS COMPARISON:  X-ray right femur dated March 01 2021 FINDINGS: Fluoroscopic images were obtained intraoperatively and submitted for post operative interpretation. Interval ORIF of the right proximal femur with hardware in expected position, 6 images were obtained with 21 seconds of fluoroscopy time and 7.89 mGy. Please see the performing provider's procedural report for further detail. IMPRESSION: As above. Electronically Signed   By: Yetta Glassman M.D.   On: 03/02/2021 16:21   DG FEMUR PORT, MIN 2 VIEWS RIGHT  Result Date: 03/02/2021 CLINICAL DATA:  Postoperative right femur fracture. EXAM:  RIGHT FEMUR PORTABLE 2 VIEW COMPARISON:  Right femur x-ray 03/01/2021. FINDINGS: There is a lateral femoral sideplate and screws and right hip arthroplasty. There is no evidence for hardware loosening. Alignment appears anatomic. Fracture is again noted in the proximal femoral diaphysis. There are vascular calcifications in the soft tissues. There is lateral soft tissue swelling and air compatible with recent surgery. Surgical clips and penile implant are noted in the pelvis. There also surgical clips in the lower extremity. IMPRESSION: 1. New Femoral sideplate and screws fix a proximal femoral fracture. Alignment is anatomic. Electronically Signed   By: Ronney Asters M.D.   On: 03/02/2021 17:06   DG FEMUR PORT, MIN 2 VIEWS RIGHT  Result Date: 03/01/2021 CLINICAL DATA:  Fall, RIGHT hip fracture EXAM: RIGHT FEMUR PORTABLE 2 VIEW COMPARISON:  None. FINDINGS: RIGHT hip prosthetic located. No fracture of the proximal femur. No fracture of the distal femur. Knee joint intact on one view. IMPRESSION: No fracture or dislocation. Electronically Signed   By: Suzy Bouchard M.D.   On: 03/01/2021 08:22       The results of significant diagnostics from this hospitalization (including imaging, microbiology, ancillary and laboratory) are listed below for reference.     Microbiology: Recent Results (from the past 240 hour(s))  Resp Panel by RT-PCR (Flu A&B, Covid) Nasopharyngeal Swab     Status: None   Collection Time: 03/01/21  2:34 AM   Specimen: Nasopharyngeal Swab; Nasopharyngeal(NP) swabs in vial transport medium  Result Value Ref Range Status   SARS Coronavirus 2 by RT PCR NEGATIVE NEGATIVE Final    Comment: (NOTE) SARS-CoV-2 target nucleic acids are NOT DETECTED.  The SARS-CoV-2 RNA is generally detectable in upper respiratory specimens during the acute phase of infection. The lowest concentration of SARS-CoV-2 viral copies this assay can detect is 138 copies/mL. A negative result does not  preclude SARS-Cov-2 infection and should not be used as the sole basis for treatment or other patient management decisions. A negative result may occur with  improper specimen collection/handling, submission of specimen other than nasopharyngeal swab, presence of viral mutation(s) within the areas targeted by this assay, and inadequate number of viral copies(<138 copies/mL). A negative result must be combined with clinical observations, patient history, and epidemiological information. The expected result is Negative.  Fact Sheet for Patients:  EntrepreneurPulse.com.au  Fact Sheet for Healthcare Providers:  IncredibleEmployment.be  This test is no t yet approved or cleared by the [place] FDA and  has been authorized for detection and/or diagnosis of SARS-CoV-2 by FDA under an Emergency Use Authorization (EUA). This EUA will remain  in effect (meaning this test can be used) for the duration of the COVID-19 declaration under Section 564(b)(1) of the Act, 21 U.S.C.section 360bbb-3(b)(1), unless the authorization is terminated  or revoked sooner.  Influenza A by PCR NEGATIVE NEGATIVE Final   Influenza B by PCR NEGATIVE NEGATIVE Final    Comment: (NOTE) The Xpert Xpress SARS-CoV-2/FLU/RSV plus assay is intended as an aid in the diagnosis of influenza from Nasopharyngeal swab specimens and should not be used as a sole basis for treatment. Nasal washings and aspirates are unacceptable for Xpert Xpress SARS-CoV-2/FLU/RSV testing.  Fact Sheet for Patients: EntrepreneurPulse.com.au  Fact Sheet for Healthcare Providers: IncredibleEmployment.be  This test is not yet approved or cleared by the [place] FDA and has been authorized for detection and/or diagnosis of SARS-CoV-2 by FDA under an Emergency Use Authorization (EUA). This EUA will remain in effect (meaning this test can be used) for the  duration of the COVID-19 declaration under Section 564(b)(1) of the Act, 21 U.S.C. section 360bbb-3(b)(1), unless the authorization is terminated or revoked.  Performed at [facility], [address], [place], [place] [address]   Surgical pcr screen     Status: Abnormal   Collection Time: 03/02/21 11:23 AM   Specimen: Nasal Mucosa; Nasal Swab  Result Value Ref Range Status   MRSA, PCR POSITIVE (A) NEGATIVE Final    Comment: RESULT CALLED TO, READ BACK BY AND VERIFIED WITH: RN T Tamala Julian [phone] AT 1340 BY CM    Staphylococcus aureus POSITIVE (A) NEGATIVE Final    Comment: (NOTE) The Xpert SA Assay (FDA approved for NASAL specimens in patients 88 years of age and older), is one component of a comprehensive surveillance program. It is not intended to diagnose infection nor to guide or monitor treatment. Performed at [facility], [place] [address], [place], [place] [address]      Labs:  CBC: Recent Labs  Lab 03/01/21 [phone] 03/02/21 0926 03/03/21 0217 03/03/21 0455 03/04/21 0048 03/05/21 0215  WBC 8.1 7.0 8.5 9.6 9.8 7.8  NEUTROABS 5.3  --   --   --   --   --   HGB 13.3 12.9* 13.3 13.2 11.4* 11.9*  HCT 40.5 39.5 38.5* 38.6* 33.3* 35.1*  MCV 95.1 91.9 89.5 89.8 91.0 91.2  PLT 164 154 154 168 165 176   BMP &GFR Recent Labs  Lab 03/02/21 0926 03/03/21 0217 03/03/21 0455 03/04/21 0048 03/05/21 0215  NA 140 138 139 138 140  K 4.0 4.3 4.6 4.1 4.0  CL 107 106 107 106 107  CO2 24 20* 22 25 23   GLUCOSE 101* 185* 169* 145* 143*  BUN 20 23 25* 30* 20  CREATININE 1.14 1.21 1.28* 1.25* 0.99  CALCIUM 9.1 9.0 9.0 8.5* 8.9  MG  --  1.8  --  2.0 1.9  PHOS  --   --   --  3.1 2.8   Estimated Creatinine Clearance: 48.3 mL/min (by C-G formula based on SCr of 0.99 mg/dL). Liver & Pancreas: Recent Labs  Lab 03/02/21 0926 03/04/21 0048 03/05/21 0215  AST 16  --   --   ALT 20  --   --   ALKPHOS 53  --   --   BILITOT 1.1  --   --   PROT 6.3*  --   --   ALBUMIN 3.1* 2.5*  2.7*   No results for input(s): LIPASE, AMYLASE in the last 168 hours. Recent Labs  Lab 03/04/21 0048  AMMONIA <10   Diabetic: No results for input(s): HGBA1C in the last 72 hours. Recent Labs  Lab 03/04/21 1245 03/04/21 1814 03/04/21 2243 03/05/21 0837 03/05/21 1155  GLUCAP 97 111* 120* 91 108*  Cardiac Enzymes: Recent Labs  Lab 03/04/21 0048  CKTOTAL 238   No results for input(s): PROBNP in the last 8760 hours. Coagulation Profile: Recent Labs  Lab 03/02/21 0926  INR 1.2   Thyroid Function Tests: Recent Labs    03/05/21 0215  TSH 2.069   Lipid Profile: No results for input(s): CHOL, HDL, LDLCALC, TRIG, CHOLHDL, LDLDIRECT in the last 72 hours. Anemia Panel: Recent Labs    03/05/21 0215  VITAMINB12 359  FOLATE 4.4*  FERRITIN 135  TIBC 224*  IRON 23*  RETICCTPCT 1.9   Urine analysis:    Component Value Date/Time   COLORURINE YELLOW 10/01/2016 2326   APPEARANCEUR CLEAR 10/01/2016 2326   LABSPEC 1.015 10/01/2016 2326   PHURINE 5.0 10/01/2016 2326   GLUCOSEU NEGATIVE 10/01/2016 2326   HGBUR NEGATIVE 10/01/2016 2326   BILIRUBINUR NEGATIVE 10/01/2016 2326   KETONESUR NEGATIVE 10/01/2016 2326   PROTEINUR NEGATIVE 10/01/2016 2326   UROBILINOGEN 0.2 11/13/2012 1238   NITRITE NEGATIVE 10/01/2016 2326   LEUKOCYTESUR NEGATIVE 10/01/2016 2326   Sepsis Labs: Invalid input(s): PROCALCITONIN, LACTICIDVEN   Time coordinating discharge: 45 minutes  SIGNED:  Mercy Riding, MD  [facility] 03/05/2021, 2:52 PM

## 2021-03-05 NOTE — TOC Progression Note (Signed)
Transition of Care [facility]) - Progression Note    Patient Details  Name: Thomas [name] MRN: [id number] Date of Birth: Nov 26, 1927  [facility] CM/SW Contact  Joanne Chars, LCSW Phone Number: 03/05/2021, 1:18 PM  Clinical Narrative:  CSW spoke with Melissa at [facility].  Pt is under long term care policy that does not require auth. They can accept pt back once he is medically stable.     Expected Discharge Plan: [place] Barriers to Discharge: Continued Medical Work up  Expected Discharge Plan and Services Expected Discharge Plan: [place] In-house Referral: Clinical Social Work   Post Acute Care Choice: Resumption of Svcs/PTA Provider Living arrangements for the past 2 months: [place]                                       Social Determinants of Health (SDOH) Interventions    Readmission Risk Interventions No flowsheet data found.

## 2021-03-05 NOTE — Progress Notes (Signed)
Orthopaedic Trauma Progress Note  SUBJECTIVE: Doing fair.  Currently trying to pull gown and blanket off as she feels her covered in urine.  Patient has a condom cath in place which does not appear to be leaking.  No urine seen in the bed or on gown/blanket.  New gown and blanket provided.  Patient appears more comfortable after this.  No other acute issues reported.  OBJECTIVE:  Vitals:   03/04/21 2226 03/05/21 0500  BP: (!) 98/37 (!) 129/52  Pulse: 89 61  Resp: 18 16  Temp: 97.8 F (36.6 C) 97.6 F (36.4 C)  SpO2: 98% 97%    General: Resting in bed, occasionally agitated.  Otherwise no acute distress Respiratory: No increased work of breathing.  RLE: Dressings removed, incisions CDI.  Difficult to obtain motor and sensory exam given baseline dementia.  Toes warm and well-perfused.  Compartments soft and compressible.  IMAGING: Stable post op imaging.   LABS:  Results for orders placed or performed during the hospital encounter of 02/28/21 (from the past 24 hour(s))  Glucose, capillary     Status: Abnormal   Collection Time: 03/04/21  8:26 AM  Result Value Ref Range   Glucose-Capillary 101 (H) 70 - 99 mg/dL  Glucose, capillary     Status: None   Collection Time: 03/04/21 12:45 PM  Result Value Ref Range   Glucose-Capillary 97 70 - 99 mg/dL  Glucose, capillary     Status: Abnormal   Collection Time: 03/04/21  6:14 PM  Result Value Ref Range   Glucose-Capillary 111 (H) 70 - 99 mg/dL  Glucose, capillary     Status: Abnormal   Collection Time: 03/04/21 10:43 PM  Result Value Ref Range   Glucose-Capillary 120 (H) 70 - 99 mg/dL  CBC     Status: Abnormal   Collection Time: 03/05/21  2:15 AM  Result Value Ref Range   WBC 7.8 4.0 - 10.5 K/uL   RBC 3.85 (L) 4.22 - 5.81 MIL/uL   Hemoglobin 11.9 (L) 13.0 - 17.0 g/dL   HCT 35.1 (L) 39.0 - 52.0 %   MCV 91.2 80.0 - 100.0 fL   MCH 30.9 26.0 - 34.0 pg   MCHC 33.9 30.0 - 36.0 g/dL   RDW 13.5 11.5 - 15.5 %   Platelets 176 150 - 400  K/uL   nRBC 0.0 0.0 - 0.2 %  Vitamin B12     Status: None   Collection Time: 03/05/21  2:15 AM  Result Value Ref Range   Vitamin B-12 359 180 - 914 pg/mL  Folate     Status: Abnormal   Collection Time: 03/05/21  2:15 AM  Result Value Ref Range   Folate 4.4 (L) >5.9 ng/mL  Iron and TIBC     Status: Abnormal   Collection Time: 03/05/21  2:15 AM  Result Value Ref Range   Iron 23 (L) 45 - 182 ug/dL   TIBC 224 (L) 250 - 450 ug/dL   Saturation Ratios 10 (L) 17.9 - 39.5 %   UIBC 201 ug/dL  Ferritin     Status: None   Collection Time: 03/05/21  2:15 AM  Result Value Ref Range   Ferritin 135 24 - 336 ng/mL  Reticulocytes     Status: Abnormal   Collection Time: 03/05/21  2:15 AM  Result Value Ref Range   Retic Ct Pct 1.9 0.4 - 3.1 %   RBC. 3.86 (L) 4.22 - 5.81 MIL/uL   Retic Count, Absolute 74.1 19.0 - 186.0 K/uL  Immature Retic Fract 20.0 (H) 2.3 - 15.9 %  Renal function panel     Status: Abnormal   Collection Time: 03/05/21  2:15 AM  Result Value Ref Range   Sodium 140 135 - 145 mmol/L   Potassium 4.0 3.5 - 5.1 mmol/L   Chloride 107 98 - 111 mmol/L   CO2 23 22 - 32 mmol/L   Glucose, Bld 143 (H) 70 - 99 mg/dL   BUN 20 8 - 23 mg/dL   Creatinine, Ser 0.99 0.61 - 1.24 mg/dL   Calcium 8.9 8.9 - 10.3 mg/dL   Phosphorus 2.8 2.5 - 4.6 mg/dL   Albumin 2.7 (L) 3.5 - 5.0 g/dL   GFR, Estimated >60 >60 mL/min   Anion gap 10 5 - 15  Magnesium     Status: None   Collection Time: 03/05/21  2:15 AM  Result Value Ref Range   Magnesium 1.9 1.7 - 2.4 mg/dL  TSH     Status: None   Collection Time: 03/05/21  2:15 AM  Result Value Ref Range   TSH 2.069 0.350 - 4.500 uIU/mL    ASSESSMENT: CAELIN RAYL is a 88 y.o. male, 3 Days Post-Op s/p OPEN REDUCTION INTERNAL FIXATION RIGHT PERIPROSTHETIC PROXIMAL FEMUR  CV/Blood loss: Hemoglobin stable at 11.9 this AM.    PLAN: Weightbearing: WBAT RLE Incisional and dressing care: Incisions may be left open to air Showering: Okay to shower with  assistance.  Incisions may get wet Orthopedic device(s): None  Pain management: Tylenol 1000 mg q 8 hours scheduled.  Try to minimize narcotic use. VTE prophylaxis: Lovenox, SCDs ID:  Ancef 2gm post op completed Foley/Lines:  No foley, KVO IVFs Impediments to Fracture Healing: Vitamin D level 42 (checked on 03/02/2021), no supplementation indicated Dispo: Therapies as tolerated.  Plan for patient to return to [facility] once medically ready. Okay for discharge from ortho standpoint once cleared by medicine team and therapies. I have sent d/c rx for Tylenol and Aspirin 325 mg BID to preferred SNF pharmacy  Follow - up plan: 2 weeks after discharge for repeat x-rays and wound check  Contact information:  Katha Hamming MD, Rushie Nyhan PA-C. After hours and holidays please check [email or web address] for group call information for [facility]   Gwinda Passe, PA-C [phone] (office) [email or web address]

## 2021-03-06 LAB — GLUCOSE, CAPILLARY: Glucose-Capillary: 110 mg/dL — ABNORMAL HIGH (ref 70–99)

## 2021-03-06 MED ORDER — MUPIROCIN 2 % EX OINT
1.0000 "application " | TOPICAL_OINTMENT | Freq: Two times a day (BID) | CUTANEOUS | Status: DC
  Administered 2021-03-06: 1 via NASAL
  Filled 2021-03-06: qty 22

## 2021-03-06 MED ORDER — CHLORHEXIDINE GLUCONATE CLOTH 2 % EX PADS
6.0000 | MEDICATED_PAD | Freq: Every day | CUTANEOUS | Status: DC
  Administered 2021-03-06: 6 via TOPICAL

## 2021-03-06 NOTE — Progress Notes (Signed)
Order to discharge pt to [facility].  Discharge instructions/AVS placed in discharge packet.  Attempted to call report to [facility].

## 2021-03-06 NOTE — Discharge Summary (Signed)
Physician Discharge Summary  ODYN TURKO OFB:[id number] DOB: July 09, 1927 DOA: 02/28/2021  PCP: Hilbert Corrigan, MD  Admit date: 02/28/2021 Discharge date: 03/06/2021 Admitted From: SNF Disposition: SNF Recommendations for Outpatient Follow-up:  Follow ups as below. Recommend palliative follow-up at SNF Please obtain CBC/BMP/Mag at follow up Please follow up on the following pending results: None  Discharge Condition: Stable CODE STATUS: DNR/DNI  Follow-up Information     Haddix, Thomasene Lot, MD. Schedule an appointment as soon as possible for a visit in 2 week(s).   Specialty: Orthopedic Surgery Contact information: [place] [address] [phone]                Hospital Course: 88 year old M with PMH of dementia, DM-2, CAD/CABG, lung cancer, prostate cancer and hyperlipidemia presented to [facility] with right hip pain and back pain after unwitnessed fall at SNF, and found to have right periprosthetic femoral fracture.  He underwent ORIF by Dr. Doreatha Martin on 03/02/2021.  CT lumbar spine, right knee and pelvic x-ray without acute finding.  He is discharged on Tylenol for pain control and full dose aspirin for VTE prophylaxis per orthopedic surgery.  Outpatient follow-up with orthopedic surgery in 2 weeks.  Hospital course complicated by delirium/confusion that has improved.  May need assistant with feeding for optimal nutrition and hydration.  See individual problem list below for more on hospital course.  Discharge Diagnoses:  Unwitnessed fall at nursing home Right femoral periprosthetic fracture likely due to fall -S/p ORIF by Dr. Doreatha Martin on 1/27. -WBAT per orthopedic surgery -Tylenol as needed for pain control -Full dose aspirin for VTE prophylaxis -Outpatient follow-up with orthopedic surgery in 2 weeks. -Continue PT/OT   Hypotension/history of hypertension: Resolved.  A.m. cortisol low at 6.5 but suspect hypotension to be due to poor p.o.  intake.  -Discontinued home Cardizem and Imdur. -Continue midodrine 5 mg 3 times daily.  Dementia with behavioral disturbance/delirium-improved.  He is oriented to self and follows command.  No focal neurodeficit. -Reorientation delirium precautions -Continue home Zyprexa and Depakote -May need assistant with feeding to ensure optimal nutrition and hydration -Recommend palliative follow-up at SNF.     NIDDM-2: A1c 6.2%.  Does not seem to be on medication for this. -Liberate diet.  Chronic COPD: Stable. -Continue inhalers   Dyslipidemia:  -Discontinued statin.  Doubt benefit given his age   Normocytic anemia: Slight drop in Hgb likely dilutional from IV fluid. -Recheck CBC in 1 week   Dysphagia: Likely from encephalopathy. -Aspiration precaution   Increased nutrient needs Body mass index is 21.62 kg/m. -Liberate diet -Continue initial -Assistance with feeding to ensure optimal nutrition and hydration.  Body mass index is 21.62 kg/m.           Discharge Exam: Vitals:   03/05/21 0846 03/05/21 1919 03/05/21 2027 03/06/21 0755  BP:   (!) 109/50 (!) 156/62  Pulse:   67 67  Temp:   98 F (36.7 C) 98 F (36.7 C)  Resp:   18 18  Height:      Weight:      SpO2: 96% 97% 97% 98%  TempSrc:   Oral   BMI (Calculated):         GENERAL: No apparent distress.  Nontoxic. HEENT: MMM.  Vision and hearing grossly intact.  NECK: Supple.  No apparent JVD.  RESP: 96% on RA.  No IWOB.  Fair aeration bilaterally. CVS:  RRR. Heart sounds normal.  ABD/GI/GU: Bowel sounds present. Soft. Non tender.  MSK/EXT:  Moves extremities. No apparent deformity. No edema.  SKIN: Surgical wound DCI. NEURO: Awake and alert.  Oriented to self.  Follows some commands.  No apparent focal neuro deficit. PSYCH: Calm. Normal affect.   Discharge Instructions  Discharge Instructions     Diet general   Complete by: As directed    Increase activity slowly   Complete by: As directed    No wound  care   Complete by: As directed       Allergies as of 03/06/2021       Reactions   Nubain [nalbuphine Hcl]    Listed on MAR-no reaction is noted        Medication List     STOP taking these medications    diltiazem 120 MG 24 hr capsule Commonly known as: CARDIZEM CD   isosorbide mononitrate 60 MG 24 hr tablet Commonly known as: IMDUR       TAKE these medications    acetaminophen 500 MG tablet Commonly known as: TYLENOL Take 2 tablets (1,000 mg total) by mouth every 8 (eight) hours as needed for moderate pain. What changed: how much to take   aspirin 325 MG tablet Take 1 tablet (325 mg total) by mouth in the morning and at bedtime.   budesonide-formoterol 160-4.5 MCG/ACT inhaler Commonly known as: SYMBICORT Inhale 2 puffs into the lungs 2 (two) times daily.   divalproex 125 MG DR tablet Commonly known as: DEPAKOTE Take 375 mg by mouth 2 (two) times daily.   feeding supplement Liqd Take 237 mLs by mouth 2 (two) times daily with a meal.   Incruse Ellipta 62.5 MCG/ACT Aepb Generic drug: umeclidinium bromide Inhale 1 puff into the lungs daily.   lidocaine 5 % Commonly known as: LIDODERM Place 1 patch onto the skin daily. Apply to lower back. Remove & Discard patch within 12 hours or as directed by MD   MELATONIN PO Take 6 mg by mouth at bedtime.   midodrine 5 MG tablet Commonly known as: PROAMATINE Take 1 tablet (5 mg total) by mouth 3 (three) times daily with meals.   multivitamin-lutein Caps capsule Take 1 capsule by mouth in the morning and at bedtime.   nitroGLYCERIN 0.4 MG SL tablet Commonly known as: NITROSTAT Place 0.4 mg under the tongue every 5 (five) minutes x 3 doses as needed for chest pain.   OLANZapine 5 MG tablet Commonly known as: ZYPREXA Take 5 mg by mouth at bedtime.   polyethylene glycol powder 17 GM/SCOOP powder Commonly known as: MiraLax Take 17 g by mouth 2 (two) times daily as needed for moderate constipation.   senna 8.6  MG Tabs tablet Commonly known as: SENOKOT Take 1 tablet (8.6 mg total) by mouth 2 (two) times daily as needed for mild constipation. What changed: when to take this   Vitamin D (Ergocalciferol) 1.25 MG (50000 UNIT) Caps capsule Commonly known as: DRISDOL Take 50,000 Units by mouth every 30 (thirty) days. 27th of each month         Consultations: Orthopedic surgery  Procedures/Studies: 1/27-ORIF of right periprosthetic femoral fracture   CT Lumbar Spine Wo Contrast  Result Date: 03/01/2021 CLINICAL DATA:  Back trauma, unwitnessed fall EXAM: CT LUMBAR SPINE WITHOUT CONTRAST TECHNIQUE: Multidetector CT imaging of the lumbar spine was performed without intravenous contrast administration. Multiplanar CT image reconstructions were also generated. RADIATION DOSE REDUCTION: This exam was performed according to the departmental dose-optimization program which includes automated exposure control, adjustment of the mA and/or kV according  to patient size and/or use of iterative reconstruction technique. COMPARISON:  No prior lumbar spine CT, correlation is made with CT abdomen pelvis 07/10/2016 and lumbar spine MRI 03/17/2009. FINDINGS: Segmentation: 5 lumbar type vertebrae. Alignment: Unchanged mild retrolisthesis L5 on S1, unchanged. No acute listhesis. Mild straightening of the normal lumbar lordosis. Vertebrae: No acute fracture or focal pathologic lesion. Redemonstrated compression deformity of L1, status post kyphoplasty, which appears unchanged compared to 2018. Osseous fusion across L5-S1. Degenerative osseous fusion across the left sacroiliac joint. Paraspinal and other soft tissues: Aortic atherosclerosis. Disc levels: No significant degenerative changes at T12-L4. L4-L5: Broad-based disc bulge. Mild-to-moderate facet arthropathy. Ligamentum flavum hypertrophy. No definite spinal canal stenosis. Mild bilateral neural foraminal narrowing. L5-S1: Osseous fusion with fixed retrolisthesis. Moderate  facet arthropathy. No spinal canal stenosis. Mild left-greater-than-right neural foraminal narrowing. IMPRESSION: 1. No acute fracture or traumatic listhesis. 2. Unchanged appearance of L1 compression deformity, status post kyphoplasty. 3. Degenerative changes in the lower lumbar spine and sacrum, with mild neural foraminal narrowing at L4-L5 and L5-S1. Electronically Signed   By: Merilyn Baba M.D.   On: 03/01/2021 02:01   CT Hip Right Wo Contrast  Result Date: 03/01/2021 CLINICAL DATA:  Trauma. EXAM: CT OF THE RIGHT HIP WITHOUT CONTRAST TECHNIQUE: Multidetector CT imaging of the right hip was performed according to the standard protocol. Multiplanar CT image reconstructions were also generated. RADIATION DOSE REDUCTION: This exam was performed according to the departmental dose-optimization program which includes automated exposure control, adjustment of the mA and/or kV according to patient size and/or use of iterative reconstruction technique. COMPARISON:  Right hip x-ray 03/01/2021. CT abdomen and pelvis 07/10/2016. FINDINGS: Bones/Joint/Cartilage There is a right hip arthroplasty in anatomic alignment. There is no evidence for hardware loosening. Or proximal portion of fracture is near the cerclage wire and distal aspect of the fracture is 2.5 cm distal to the tip of the femoral component of the arthroplasty. There is a healed right inferior pubic ramus fracture. No dislocation. Ligaments Suboptimally assessed by CT. Muscles and Tendons Grossly within normal limits. Limited evaluation secondary to streak artifact. Soft tissues Grossly within normal limits. Limited evaluation secondary to streak artifact. IMPRESSION: 1. Acute nondisplaced oblique fracture of the proximal femoral diaphysis at and below the level of the femoral component of the hip arthroplasty. 2. Right hip arthroplasty in anatomic alignment. Electronically Signed   By: Ronney Asters M.D.   On: 03/01/2021 02:01   DG C-Arm 1-60 Min-No  Report  Result Date: 03/02/2021 Fluoroscopy was utilized by the requesting physician.  No radiographic interpretation.   DG HIP UNILAT WITH PELVIS 2-3 VIEWS RIGHT  Result Date: 03/01/2021 CLINICAL DATA:  Unwitnessed fall.  Right hip and back pain. EXAM: DG HIP (WITH OR WITHOUT PELVIS) 2-3V RIGHT COMPARISON:  Similar study 10/04/2016. FINDINGS: Generalized osteopenia. Right hip arthroplasty is again noted without evidence of significant loosening, dislocation of the prosthesis, or perihardware fractures. On the left, an old hip nailing is again noted and there is increased flattening of the superior femoral head consistent with avascular necrosis with moderate secondary DJD also increased. There are numerous surgical clips along the pelvic sidewalls. Ankylosis of portions of both SI joints. Pelvic enthesopathy is again shown as well.  Penile implant. IMPRESSION: 1. Osteopenia, degenerative and postsurgical change. 2. No evidence of fractures. 3. Increased superior left femoral head flattening consistent with interval avascular necrosis with articular surface subsidence, moderate secondary DJD. Electronically Signed   By: Telford Nab M.D.   On: 03/01/2021 00:49  DG FEMUR, MIN 2 VIEWS RIGHT  Result Date: 03/02/2021 CLINICAL DATA:  ORIF proximal right femur EXAM: RIGHT FEMUR 2 VIEWS COMPARISON:  X-ray right femur dated March 01 2021 FINDINGS: Fluoroscopic images were obtained intraoperatively and submitted for post operative interpretation. Interval ORIF of the right proximal femur with hardware in expected position, 6 images were obtained with 21 seconds of fluoroscopy time and 7.89 mGy. Please see the performing provider's procedural report for further detail. IMPRESSION: As above. Electronically Signed   By: Yetta Glassman M.D.   On: 03/02/2021 16:21   DG FEMUR PORT, MIN 2 VIEWS RIGHT  Result Date: 03/02/2021 CLINICAL DATA:  Postoperative right femur fracture. EXAM: RIGHT FEMUR PORTABLE 2 VIEW  COMPARISON:  Right femur x-ray 03/01/2021. FINDINGS: There is a lateral femoral sideplate and screws and right hip arthroplasty. There is no evidence for hardware loosening. Alignment appears anatomic. Fracture is again noted in the proximal femoral diaphysis. There are vascular calcifications in the soft tissues. There is lateral soft tissue swelling and air compatible with recent surgery. Surgical clips and penile implant are noted in the pelvis. There also surgical clips in the lower extremity. IMPRESSION: 1. New Femoral sideplate and screws fix a proximal femoral fracture. Alignment is anatomic. Electronically Signed   By: Ronney Asters M.D.   On: 03/02/2021 17:06   DG FEMUR PORT, MIN 2 VIEWS RIGHT  Result Date: 03/01/2021 CLINICAL DATA:  Fall, RIGHT hip fracture EXAM: RIGHT FEMUR PORTABLE 2 VIEW COMPARISON:  None. FINDINGS: RIGHT hip prosthetic located. No fracture of the proximal femur. No fracture of the distal femur. Knee joint intact on one view. IMPRESSION: No fracture or dislocation. Electronically Signed   By: Suzy Bouchard M.D.   On: 03/01/2021 08:22       The results of significant diagnostics from this hospitalization (including imaging, microbiology, ancillary and laboratory) are listed below for reference.     Microbiology: Recent Results (from the past 240 hour(s))  Resp Panel by RT-PCR (Flu A&B, Covid) Nasopharyngeal Swab     Status: None   Collection Time: 03/01/21  2:34 AM   Specimen: Nasopharyngeal Swab; Nasopharyngeal(NP) swabs in vial transport medium  Result Value Ref Range Status   SARS Coronavirus 2 by RT PCR NEGATIVE NEGATIVE Final    Comment: (NOTE) SARS-CoV-2 target nucleic acids are NOT DETECTED.  The SARS-CoV-2 RNA is generally detectable in upper respiratory specimens during the acute phase of infection. The lowest concentration of SARS-CoV-2 viral copies this assay can detect is 138 copies/mL. A negative result does not preclude SARS-Cov-2 infection and  should not be used as the sole basis for treatment or other patient management decisions. A negative result may occur with  improper specimen collection/handling, submission of specimen other than nasopharyngeal swab, presence of viral mutation(s) within the areas targeted by this assay, and inadequate number of viral copies(<138 copies/mL). A negative result must be combined with clinical observations, patient history, and epidemiological information. The expected result is Negative.  Fact Sheet for Patients:  EntrepreneurPulse.com.au  Fact Sheet for Healthcare Providers:  IncredibleEmployment.be  This test is no t yet approved or cleared by the [place] FDA and  has been authorized for detection and/or diagnosis of SARS-CoV-2 by FDA under an Emergency Use Authorization (EUA). This EUA will remain  in effect (meaning this test can be used) for the duration of the COVID-19 declaration under Section 564(b)(1) of the Act, 21 U.S.C.section 360bbb-3(b)(1), unless the authorization is terminated  or revoked sooner.  Influenza A by PCR NEGATIVE NEGATIVE Final   Influenza B by PCR NEGATIVE NEGATIVE Final    Comment: (NOTE) The Xpert Xpress SARS-CoV-2/FLU/RSV plus assay is intended as an aid in the diagnosis of influenza from Nasopharyngeal swab specimens and should not be used as a sole basis for treatment. Nasal washings and aspirates are unacceptable for Xpert Xpress SARS-CoV-2/FLU/RSV testing.  Fact Sheet for Patients: EntrepreneurPulse.com.au  Fact Sheet for Healthcare Providers: IncredibleEmployment.be  This test is not yet approved or cleared by the [place] FDA and has been authorized for detection and/or diagnosis of SARS-CoV-2 by FDA under an Emergency Use Authorization (EUA). This EUA will remain in effect (meaning this test can be used) for the duration of the COVID-19 declaration  under Section 564(b)(1) of the Act, 21 U.S.C. section 360bbb-3(b)(1), unless the authorization is terminated or revoked.  Performed at [facility], [address] [place], [place] [address]   Surgical pcr screen     Status: Abnormal   Collection Time: 03/02/21 11:23 AM   Specimen: Nasal Mucosa; Nasal Swab  Result Value Ref Range Status   MRSA, PCR POSITIVE (A) NEGATIVE Final    Comment: RESULT CALLED TO, READ BACK BY AND VERIFIED WITH: RN T Tamala Julian [phone] AT 1340 BY CM    Staphylococcus aureus POSITIVE (A) NEGATIVE Final    Comment: (NOTE) The Xpert SA Assay (FDA approved for NASAL specimens in patients 91 years of age and older), is one component of a comprehensive surveillance program. It is not intended to diagnose infection nor to guide or monitor treatment. Performed at [facility], [place] [address], [place], [place] [address]   Resp Panel by RT-PCR (Flu A&B, Covid) Nasopharyngeal Swab     Status: None   Collection Time: 03/05/21 11:45 AM   Specimen: Nasopharyngeal Swab; Nasopharyngeal(NP) swabs in vial transport medium  Result Value Ref Range Status   SARS Coronavirus 2 by RT PCR NEGATIVE NEGATIVE Final    Comment: (NOTE) SARS-CoV-2 target nucleic acids are NOT DETECTED.  The SARS-CoV-2 RNA is generally detectable in upper respiratory specimens during the acute phase of infection. The lowest concentration of SARS-CoV-2 viral copies this assay can detect is 138 copies/mL. A negative result does not preclude SARS-Cov-2 infection and should not be used as the sole basis for treatment or other patient management decisions. A negative result may occur with  improper specimen collection/handling, submission of specimen other than nasopharyngeal swab, presence of viral mutation(s) within the areas targeted by this assay, and inadequate number of viral copies(<138 copies/mL). A negative result must be combined with clinical observations, patient history, and  epidemiological information. The expected result is Negative.  Fact Sheet for Patients:  EntrepreneurPulse.com.au  Fact Sheet for Healthcare Providers:  IncredibleEmployment.be  This test is no t yet approved or cleared by the [place] FDA and  has been authorized for detection and/or diagnosis of SARS-CoV-2 by FDA under an Emergency Use Authorization (EUA). This EUA will remain  in effect (meaning this test can be used) for the duration of the COVID-19 declaration under Section 564(b)(1) of the Act, 21 U.S.C.section 360bbb-3(b)(1), unless the authorization is terminated  or revoked sooner.       Influenza A by PCR NEGATIVE NEGATIVE Final   Influenza B by PCR NEGATIVE NEGATIVE Final    Comment: (NOTE) The Xpert Xpress SARS-CoV-2/FLU/RSV plus assay is intended as an aid in the diagnosis of influenza from Nasopharyngeal swab specimens and should not be used as a sole basis for  treatment. Nasal washings and aspirates are unacceptable for Xpert Xpress SARS-CoV-2/FLU/RSV testing.  Fact Sheet for Patients: EntrepreneurPulse.com.au  Fact Sheet for Healthcare Providers: IncredibleEmployment.be  This test is not yet approved or cleared by the [place] FDA and has been authorized for detection and/or diagnosis of SARS-CoV-2 by FDA under an Emergency Use Authorization (EUA). This EUA will remain in effect (meaning this test can be used) for the duration of the COVID-19 declaration under Section 564(b)(1) of the Act, 21 U.S.C. section 360bbb-3(b)(1), unless the authorization is terminated or revoked.  Performed at [facility], [place] [address], [place], [place] [address]      Labs:  CBC: Recent Labs  Lab 03/01/21 [phone] 03/02/21 0926 03/03/21 0217 03/03/21 0455 03/04/21 0048 03/05/21 0215  WBC 8.1 7.0 8.5 9.6 9.8 7.8  NEUTROABS 5.3  --   --   --   --   --   HGB 13.3 12.9* 13.3 13.2 11.4*  11.9*  HCT 40.5 39.5 38.5* 38.6* 33.3* 35.1*  MCV 95.1 91.9 89.5 89.8 91.0 91.2  PLT 164 154 154 168 165 176   BMP &GFR Recent Labs  Lab 03/02/21 0926 03/03/21 0217 03/03/21 0455 03/04/21 0048 03/05/21 0215  NA 140 138 139 138 140  K 4.0 4.3 4.6 4.1 4.0  CL 107 106 107 106 107  CO2 24 20* 22 25 23   GLUCOSE 101* 185* 169* 145* 143*  BUN 20 23 25* 30* 20  CREATININE 1.14 1.21 1.28* 1.25* 0.99  CALCIUM 9.1 9.0 9.0 8.5* 8.9  MG  --  1.8  --  2.0 1.9  PHOS  --   --   --  3.1 2.8   Estimated Creatinine Clearance: 48.3 mL/min (by C-G formula based on SCr of 0.99 mg/dL). Liver & Pancreas: Recent Labs  Lab 03/02/21 0926 03/04/21 0048 03/05/21 0215  AST 16  --   --   ALT 20  --   --   ALKPHOS 53  --   --   BILITOT 1.1  --   --   PROT 6.3*  --   --   ALBUMIN 3.1* 2.5* 2.7*   No results for input(s): LIPASE, AMYLASE in the last 168 hours. Recent Labs  Lab 03/04/21 0048  AMMONIA <10   Diabetic: No results for input(s): HGBA1C in the last 72 hours. Recent Labs  Lab 03/05/21 0837 03/05/21 1155 03/05/21 1712 03/05/21 2044 03/06/21 0739  GLUCAP 91 108* 129* 128* 110*   Cardiac Enzymes: Recent Labs  Lab 03/04/21 0048  CKTOTAL 238   No results for input(s): PROBNP in the last 8760 hours. Coagulation Profile: Recent Labs  Lab 03/02/21 0926  INR 1.2   Thyroid Function Tests: Recent Labs    03/05/21 0215  TSH 2.069   Lipid Profile: No results for input(s): CHOL, HDL, LDLCALC, TRIG, CHOLHDL, LDLDIRECT in the last 72 hours. Anemia Panel: Recent Labs    03/05/21 0215  VITAMINB12 359  FOLATE 4.4*  FERRITIN 135  TIBC 224*  IRON 23*  RETICCTPCT 1.9   Urine analysis:    Component Value Date/Time   COLORURINE YELLOW 10/01/2016 2326   APPEARANCEUR CLEAR 10/01/2016 2326   LABSPEC 1.015 10/01/2016 2326   PHURINE 5.0 10/01/2016 2326   GLUCOSEU NEGATIVE 10/01/2016 2326   HGBUR NEGATIVE 10/01/2016 2326   BILIRUBINUR NEGATIVE 10/01/2016 2326   KETONESUR  NEGATIVE 10/01/2016 2326   PROTEINUR NEGATIVE 10/01/2016 2326   UROBILINOGEN 0.2 11/13/2012 1238   NITRITE NEGATIVE 10/01/2016 2326   LEUKOCYTESUR  NEGATIVE 10/01/2016 2326   Sepsis Labs: Invalid input(s): PROCALCITONIN, LACTICIDVEN   Time coordinating discharge: 45 minutes  SIGNED:  Mercy Riding, MD  [facility] 03/06/2021, 10:15 AM

## 2021-03-06 NOTE — Progress Notes (Signed)
PT Cancellation Note  Patient Details Name: KENTAVIOUS MICHELE MRN: [id number] DOB: 31-Oct-1927   Cancelled Treatment:    Reason Eval/Treat Not Completed: Patient preparing to d/c to SNF, will defer tx.   Mabeline Caras, PT, DPT Acute Rehabilitation Services  Pager [phone] Office [place] 03/06/2021, 10:55 AM

## 2021-03-06 NOTE — Plan of Care (Signed)

## 2021-03-06 NOTE — Progress Notes (Signed)
OT Cancellation Note  Patient Details Name: Thomas [name] MRN: [id number] DOB: [birth year]   Cancelled Treatment:    Reason Eval/Treat Not Completed: Other (comment) (Per RN pt d/cing at this time.)  Lynnda Child, OTD, OTR/L Acute Rehab [phone] - [place] 03/06/2021, 10:46 AM

## 2021-03-06 NOTE — TOC Transition Note (Signed)
Transition of Care [facility]) - CM/SW Discharge Note   Patient Details  Name: VANNAK MONTENEGRO MRN: [id number] Date of Birth: 03-05-1927  Transition of Care [facility]) CM/SW Contact:  Joanne Chars, LCSW Phone Number: 03/06/2021, 10:22 AM   Clinical Narrative:   Pt discharging to [facility].  RN call [phone] for report.      Final next level of care: Long Term Nursing Home Barriers to Discharge: Barriers Resolved   Patient Goals and CMS Choice Patient states their goals for this hospitalization and ongoing recovery are:: SNF CMS Medicare.gov Compare Post Acute Care list provided to:: Legal Guardian (Chance) Choice offered to / list presented to : [place] / [place]  Discharge Placement              Patient chooses bed at:  [facility]) Patient to be transferred to facility by: [place] Name of family member notified: [name], legal guardian/[place] DSS Patient and family notified of of transfer: 03/06/21  Discharge Plan and Services In-house Referral: Clinical Social Work   Post Acute Care Choice: Resumption of Svcs/PTA Provider                               Social Determinants of Health (SDOH) Interventions     Readmission Risk Interventions No flowsheet data found.

## 2021-03-06 NOTE — Progress Notes (Signed)
Physician Discharge Summary  Thomas [name] DZH:[id number] DOB: [birth year] DOA: 02/28/2021  PCP: Hilbert Corrigan, MD  Admit date: 02/28/2021 Discharge date: 03/06/2021 Admitted From: SNF Disposition: SNF Recommendations for Outpatient Follow-up:  Follow ups as below. Recommend palliative follow-up at SNF Please obtain CBC/BMP/Mag at follow up Please follow up on the following pending results: None  Discharge Condition: Stable CODE STATUS: DNR/DNI  Follow-up Information     Haddix, Thomasene Lot, MD. Schedule an appointment as soon as possible for a visit in 2 week(s).   Specialty: Orthopedic Surgery Contact information: [place] [address] [phone]                Hospital Course: 88 year old M with PMH of dementia, DM-2, CAD/CABG, lung cancer, prostate cancer and hyperlipidemia presented to [facility] with right hip pain and back pain after unwitnessed fall at SNF, and found to have right periprosthetic femoral fracture.  He underwent ORIF by Dr. Doreatha Martin on 03/02/2021.  CT lumbar spine, right knee and pelvic x-ray without acute finding.  He is discharged on Tylenol for pain control and full dose aspirin for VTE prophylaxis per orthopedic surgery.  Outpatient follow-up with orthopedic surgery in 2 weeks.  Hospital course complicated by delirium/confusion that has improved.  May need assistant with feeding for optimal nutrition and hydration.  See individual problem list below for more on hospital course.  Discharge Diagnoses:  Unwitnessed fall at nursing home Right femoral periprosthetic fracture likely due to fall -S/p ORIF by Dr. Doreatha Martin on 1/27. -WBAT per orthopedic surgery -Tylenol as needed for pain control -Full dose aspirin for VTE prophylaxis -Outpatient follow-up with orthopedic surgery in 2 weeks. -Continue PT/OT   Hypotension/history of hypertension: Resolved.  A.m. cortisol low at 6.5 but suspect hypotension to be due to poor p.o.  intake.  -Discontinued home Cardizem and Imdur. -Continue midodrine 5 mg 3 times daily.  Dementia with behavioral disturbance/delirium-improved.  He is oriented to self and follows command.  No focal neurodeficit. -Reorientation delirium precautions -Continue home Zyprexa and Depakote -May need assistant with feeding to ensure optimal nutrition and hydration -Recommend palliative follow-up at SNF.     NIDDM-2: A1c 6.2%.  Does not seem to be on medication for this. -Liberate diet.  Chronic COPD: Stable. -Continue inhalers   Dyslipidemia:  -Discontinued statin.  Doubt benefit given his age   Normocytic anemia: Slight drop in Hgb likely dilutional from IV fluid. -Recheck CBC in 1 week   Dysphagia: Likely from encephalopathy. -Aspiration precaution   Increased nutrient needs Body mass index is 21.62 kg/m. -Liberate diet -Continue initial -Assistance with feeding to ensure optimal nutrition and hydration.  Body mass index is 21.62 kg/m.           Discharge Exam: Vitals:   03/05/21 0846 03/05/21 1919 03/05/21 2027 03/06/21 0755  BP:   (!) 109/50 (!) 156/62  Pulse:   67 67  Temp:   98 F (36.7 C) 98 F (36.7 C)  Resp:   18 18  Height:      Weight:      SpO2: 96% 97% 97% 98%  TempSrc:   Oral   BMI (Calculated):         GENERAL: No apparent distress.  Nontoxic. HEENT: MMM.  Vision and hearing grossly intact.  NECK: Supple.  No apparent JVD.  RESP: 96% on RA.  No IWOB.  Fair aeration bilaterally. CVS:  RRR. Heart sounds normal.  ABD/GI/GU: Bowel sounds present. Soft. Non tender.  MSK/EXT:  Moves extremities. No apparent deformity. No edema.  SKIN: Surgical wound DCI. NEURO: Awake and alert.  Oriented to self.  Follows some commands.  No apparent focal neuro deficit. PSYCH: Calm. Normal affect.   Discharge Instructions  Discharge Instructions     Diet general   Complete by: As directed    Increase activity slowly   Complete by: As directed    No wound  care   Complete by: As directed       Allergies as of 03/06/2021       Reactions   Nubain [nalbuphine Hcl]    Listed on MAR-no reaction is noted        Medication List     STOP taking these medications    diltiazem 120 MG 24 hr capsule Commonly known as: CARDIZEM CD   isosorbide mononitrate 60 MG 24 hr tablet Commonly known as: IMDUR       TAKE these medications    acetaminophen 500 MG tablet Commonly known as: TYLENOL Take 2 tablets (1,000 mg total) by mouth every 8 (eight) hours as needed for moderate pain. What changed: how much to take   aspirin 325 MG tablet Take 1 tablet (325 mg total) by mouth in the morning and at bedtime.   budesonide-formoterol 160-4.5 MCG/ACT inhaler Commonly known as: SYMBICORT Inhale 2 puffs into the lungs 2 (two) times daily.   divalproex 125 MG DR tablet Commonly known as: DEPAKOTE Take 375 mg by mouth 2 (two) times daily.   feeding supplement Liqd Take 237 mLs by mouth 2 (two) times daily with a meal.   Incruse Ellipta 62.5 MCG/ACT Aepb Generic drug: umeclidinium bromide Inhale 1 puff into the lungs daily.   lidocaine 5 % Commonly known as: LIDODERM Place 1 patch onto the skin daily. Apply to lower back. Remove & Discard patch within 12 hours or as directed by MD   MELATONIN PO Take 6 mg by mouth at bedtime.   midodrine 5 MG tablet Commonly known as: PROAMATINE Take 1 tablet (5 mg total) by mouth 3 (three) times daily with meals.   multivitamin-lutein Caps capsule Take 1 capsule by mouth in the morning and at bedtime.   nitroGLYCERIN 0.4 MG SL tablet Commonly known as: NITROSTAT Place 0.4 mg under the tongue every 5 (five) minutes x 3 doses as needed for chest pain.   OLANZapine 5 MG tablet Commonly known as: ZYPREXA Take 5 mg by mouth at bedtime.   polyethylene glycol powder 17 GM/SCOOP powder Commonly known as: MiraLax Take 17 g by mouth 2 (two) times daily as needed for moderate constipation.   senna 8.6  MG Tabs tablet Commonly known as: SENOKOT Take 1 tablet (8.6 mg total) by mouth 2 (two) times daily as needed for mild constipation. What changed: when to take this   Vitamin D (Ergocalciferol) 1.25 MG (50000 UNIT) Caps capsule Commonly known as: DRISDOL Take 50,000 Units by mouth every 30 (thirty) days. 27th of each month         Consultations: Orthopedic surgery  Procedures/Studies: 1/27-ORIF of right periprosthetic femoral fracture   CT Lumbar Spine Wo Contrast  Result Date: 03/01/2021 CLINICAL DATA:  Back trauma, unwitnessed fall EXAM: CT LUMBAR SPINE WITHOUT CONTRAST TECHNIQUE: Multidetector CT imaging of the lumbar spine was performed without intravenous contrast administration. Multiplanar CT image reconstructions were also generated. RADIATION DOSE REDUCTION: This exam was performed according to the departmental dose-optimization program which includes automated exposure control, adjustment of the mA and/or kV according  to patient size and/or use of iterative reconstruction technique. COMPARISON:  No prior lumbar spine CT, correlation is made with CT abdomen pelvis 07/10/2016 and lumbar spine MRI 03/17/2009. FINDINGS: Segmentation: 5 lumbar type vertebrae. Alignment: Unchanged mild retrolisthesis L5 on S1, unchanged. No acute listhesis. Mild straightening of the normal lumbar lordosis. Vertebrae: No acute fracture or focal pathologic lesion. Redemonstrated compression deformity of L1, status post kyphoplasty, which appears unchanged compared to 2018. Osseous fusion across L5-S1. Degenerative osseous fusion across the left sacroiliac joint. Paraspinal and other soft tissues: Aortic atherosclerosis. Disc levels: No significant degenerative changes at T12-L4. L4-L5: Broad-based disc bulge. Mild-to-moderate facet arthropathy. Ligamentum flavum hypertrophy. No definite spinal canal stenosis. Mild bilateral neural foraminal narrowing. L5-S1: Osseous fusion with fixed retrolisthesis. Moderate  facet arthropathy. No spinal canal stenosis. Mild left-greater-than-right neural foraminal narrowing. IMPRESSION: 1. No acute fracture or traumatic listhesis. 2. Unchanged appearance of L1 compression deformity, status post kyphoplasty. 3. Degenerative changes in the lower lumbar spine and sacrum, with mild neural foraminal narrowing at L4-L5 and L5-S1. Electronically Signed   By: Merilyn Baba M.D.   On: 03/01/2021 02:01   CT Hip Right Wo Contrast  Result Date: 03/01/2021 CLINICAL DATA:  Trauma. EXAM: CT OF THE RIGHT HIP WITHOUT CONTRAST TECHNIQUE: Multidetector CT imaging of the right hip was performed according to the standard protocol. Multiplanar CT image reconstructions were also generated. RADIATION DOSE REDUCTION: This exam was performed according to the departmental dose-optimization program which includes automated exposure control, adjustment of the mA and/or kV according to patient size and/or use of iterative reconstruction technique. COMPARISON:  Right hip x-ray 03/01/2021. CT abdomen and pelvis 07/10/2016. FINDINGS: Bones/Joint/Cartilage There is a right hip arthroplasty in anatomic alignment. There is no evidence for hardware loosening. Or proximal portion of fracture is near the cerclage wire and distal aspect of the fracture is 2.5 cm distal to the tip of the femoral component of the arthroplasty. There is a healed right inferior pubic ramus fracture. No dislocation. Ligaments Suboptimally assessed by CT. Muscles and Tendons Grossly within normal limits. Limited evaluation secondary to streak artifact. Soft tissues Grossly within normal limits. Limited evaluation secondary to streak artifact. IMPRESSION: 1. Acute nondisplaced oblique fracture of the proximal femoral diaphysis at and below the level of the femoral component of the hip arthroplasty. 2. Right hip arthroplasty in anatomic alignment. Electronically Signed   By: Ronney Asters M.D.   On: 03/01/2021 02:01   DG C-Arm 1-60 Min-No  Report  Result Date: 03/02/2021 Fluoroscopy was utilized by the requesting physician.  No radiographic interpretation.   DG HIP UNILAT WITH PELVIS 2-3 VIEWS RIGHT  Result Date: 03/01/2021 CLINICAL DATA:  Unwitnessed fall.  Right hip and back pain. EXAM: DG HIP (WITH OR WITHOUT PELVIS) 2-3V RIGHT COMPARISON:  Similar study 10/04/2016. FINDINGS: Generalized osteopenia. Right hip arthroplasty is again noted without evidence of significant loosening, dislocation of the prosthesis, or perihardware fractures. On the left, an old hip nailing is again noted and there is increased flattening of the superior femoral head consistent with avascular necrosis with moderate secondary DJD also increased. There are numerous surgical clips along the pelvic sidewalls. Ankylosis of portions of both SI joints. Pelvic enthesopathy is again shown as well.  Penile implant. IMPRESSION: 1. Osteopenia, degenerative and postsurgical change. 2. No evidence of fractures. 3. Increased superior left femoral head flattening consistent with interval avascular necrosis with articular surface subsidence, moderate secondary DJD. Electronically Signed   By: Telford Nab M.D.   On: 03/01/2021 00:49  DG FEMUR, MIN 2 VIEWS RIGHT  Result Date: 03/02/2021 CLINICAL DATA:  ORIF proximal right femur EXAM: RIGHT FEMUR 2 VIEWS COMPARISON:  X-ray right femur dated March 01 2021 FINDINGS: Fluoroscopic images were obtained intraoperatively and submitted for post operative interpretation. Interval ORIF of the right proximal femur with hardware in expected position, 6 images were obtained with 21 seconds of fluoroscopy time and 7.89 mGy. Please see the performing provider's procedural report for further detail. IMPRESSION: As above. Electronically Signed   By: Yetta Glassman M.D.   On: 03/02/2021 16:21   DG FEMUR PORT, MIN 2 VIEWS RIGHT  Result Date: 03/02/2021 CLINICAL DATA:  Postoperative right femur fracture. EXAM: RIGHT FEMUR PORTABLE 2 VIEW  COMPARISON:  Right femur x-ray 03/01/2021. FINDINGS: There is a lateral femoral sideplate and screws and right hip arthroplasty. There is no evidence for hardware loosening. Alignment appears anatomic. Fracture is again noted in the proximal femoral diaphysis. There are vascular calcifications in the soft tissues. There is lateral soft tissue swelling and air compatible with recent surgery. Surgical clips and penile implant are noted in the pelvis. There also surgical clips in the lower extremity. IMPRESSION: 1. New Femoral sideplate and screws fix a proximal femoral fracture. Alignment is anatomic. Electronically Signed   By: Ronney Asters M.D.   On: 03/02/2021 17:06   DG FEMUR PORT, MIN 2 VIEWS RIGHT  Result Date: 03/01/2021 CLINICAL DATA:  Fall, RIGHT hip fracture EXAM: RIGHT FEMUR PORTABLE 2 VIEW COMPARISON:  None. FINDINGS: RIGHT hip prosthetic located. No fracture of the proximal femur. No fracture of the distal femur. Knee joint intact on one view. IMPRESSION: No fracture or dislocation. Electronically Signed   By: Suzy Bouchard M.D.   On: 03/01/2021 08:22       The results of significant diagnostics from this hospitalization (including imaging, microbiology, ancillary and laboratory) are listed below for reference.     Microbiology: Recent Results (from the past 240 hour(s))  Resp Panel by RT-PCR (Flu A&B, Covid) Nasopharyngeal Swab     Status: None   Collection Time: 03/01/21  2:34 AM   Specimen: Nasopharyngeal Swab; Nasopharyngeal(NP) swabs in vial transport medium  Result Value Ref Range Status   SARS Coronavirus 2 by RT PCR NEGATIVE NEGATIVE Final    Comment: (NOTE) SARS-CoV-2 target nucleic acids are NOT DETECTED.  The SARS-CoV-2 RNA is generally detectable in upper respiratory specimens during the acute phase of infection. The lowest concentration of SARS-CoV-2 viral copies this assay can detect is 138 copies/mL. A negative result does not preclude SARS-Cov-2 infection and  should not be used as the sole basis for treatment or other patient management decisions. A negative result may occur with  improper specimen collection/handling, submission of specimen other than nasopharyngeal swab, presence of viral mutation(s) within the areas targeted by this assay, and inadequate number of viral copies(<138 copies/mL). A negative result must be combined with clinical observations, patient history, and epidemiological information. The expected result is Negative.  Fact Sheet for Patients:  EntrepreneurPulse.com.au  Fact Sheet for Healthcare Providers:  IncredibleEmployment.be  This test is no t yet approved or cleared by the [place] FDA and  has been authorized for detection and/or diagnosis of SARS-CoV-2 by FDA under an Emergency Use Authorization (EUA). This EUA will remain  in effect (meaning this test can be used) for the duration of the COVID-19 declaration under Section 564(b)(1) of the Act, 21 U.S.C.section 360bbb-3(b)(1), unless the authorization is terminated  or revoked sooner.  Influenza A by PCR NEGATIVE NEGATIVE Final   Influenza B by PCR NEGATIVE NEGATIVE Final    Comment: (NOTE) The Xpert Xpress SARS-CoV-2/FLU/RSV plus assay is intended as an aid in the diagnosis of influenza from Nasopharyngeal swab specimens and should not be used as a sole basis for treatment. Nasal washings and aspirates are unacceptable for Xpert Xpress SARS-CoV-2/FLU/RSV testing.  Fact Sheet for Patients: EntrepreneurPulse.com.au  Fact Sheet for Healthcare Providers: IncredibleEmployment.be  This test is not yet approved or cleared by the [place] FDA and has been authorized for detection and/or diagnosis of SARS-CoV-2 by FDA under an Emergency Use Authorization (EUA). This EUA will remain in effect (meaning this test can be used) for the duration of the COVID-19 declaration  under Section 564(b)(1) of the Act, 21 U.S.C. section 360bbb-3(b)(1), unless the authorization is terminated or revoked.  Performed at [facility], [address] [place], [place] [address]   Surgical pcr screen     Status: Abnormal   Collection Time: 03/02/21 11:23 AM   Specimen: Nasal Mucosa; Nasal Swab  Result Value Ref Range Status   MRSA, PCR POSITIVE (A) NEGATIVE Final    Comment: RESULT CALLED TO, READ BACK BY AND VERIFIED WITH: RN T Tamala Julian [phone] AT 1340 BY CM    Staphylococcus aureus POSITIVE (A) NEGATIVE Final    Comment: (NOTE) The Xpert SA Assay (FDA approved for NASAL specimens in patients 38 years of age and older), is one component of a comprehensive surveillance program. It is not intended to diagnose infection nor to guide or monitor treatment. Performed at [facility], [place] [address], [place], [place] [address]   Resp Panel by RT-PCR (Flu A&B, Covid) Nasopharyngeal Swab     Status: None   Collection Time: 03/05/21 11:45 AM   Specimen: Nasopharyngeal Swab; Nasopharyngeal(NP) swabs in vial transport medium  Result Value Ref Range Status   SARS Coronavirus 2 by RT PCR NEGATIVE NEGATIVE Final    Comment: (NOTE) SARS-CoV-2 target nucleic acids are NOT DETECTED.  The SARS-CoV-2 RNA is generally detectable in upper respiratory specimens during the acute phase of infection. The lowest concentration of SARS-CoV-2 viral copies this assay can detect is 138 copies/mL. A negative result does not preclude SARS-Cov-2 infection and should not be used as the sole basis for treatment or other patient management decisions. A negative result may occur with  improper specimen collection/handling, submission of specimen other than nasopharyngeal swab, presence of viral mutation(s) within the areas targeted by this assay, and inadequate number of viral copies(<138 copies/mL). A negative result must be combined with clinical observations, patient history, and  epidemiological information. The expected result is Negative.  Fact Sheet for Patients:  EntrepreneurPulse.com.au  Fact Sheet for Healthcare Providers:  IncredibleEmployment.be  This test is no t yet approved or cleared by the [place] FDA and  has been authorized for detection and/or diagnosis of SARS-CoV-2 by FDA under an Emergency Use Authorization (EUA). This EUA will remain  in effect (meaning this test can be used) for the duration of the COVID-19 declaration under Section 564(b)(1) of the Act, 21 U.S.C.section 360bbb-3(b)(1), unless the authorization is terminated  or revoked sooner.       Influenza A by PCR NEGATIVE NEGATIVE Final   Influenza B by PCR NEGATIVE NEGATIVE Final    Comment: (NOTE) The Xpert Xpress SARS-CoV-2/FLU/RSV plus assay is intended as an aid in the diagnosis of influenza from Nasopharyngeal swab specimens and should not be used as a sole basis for  treatment. Nasal washings and aspirates are unacceptable for Xpert Xpress SARS-CoV-2/FLU/RSV testing.  Fact Sheet for Patients: EntrepreneurPulse.com.au  Fact Sheet for Healthcare Providers: IncredibleEmployment.be  This test is not yet approved or cleared by the [place] FDA and has been authorized for detection and/or diagnosis of SARS-CoV-2 by FDA under an Emergency Use Authorization (EUA). This EUA will remain in effect (meaning this test can be used) for the duration of the COVID-19 declaration under Section 564(b)(1) of the Act, 21 U.S.C. section 360bbb-3(b)(1), unless the authorization is terminated or revoked.  Performed at [facility], [place] [address], [place], [place] [address]      Labs:  CBC: Recent Labs  Lab 03/01/21 [phone] 03/02/21 0926 03/03/21 0217 03/03/21 0455 03/04/21 0048 03/05/21 0215  WBC 8.1 7.0 8.5 9.6 9.8 7.8  NEUTROABS 5.3  --   --   --   --   --   HGB 13.3 12.9* 13.3 13.2 11.4*  11.9*  HCT 40.5 39.5 38.5* 38.6* 33.3* 35.1*  MCV 95.1 91.9 89.5 89.8 91.0 91.2  PLT 164 154 154 168 165 176   BMP &GFR Recent Labs  Lab 03/02/21 0926 03/03/21 0217 03/03/21 0455 03/04/21 0048 03/05/21 0215  NA 140 138 139 138 140  K 4.0 4.3 4.6 4.1 4.0  CL 107 106 107 106 107  CO2 24 20* 22 25 23   GLUCOSE 101* 185* 169* 145* 143*  BUN 20 23 25* 30* 20  CREATININE 1.14 1.21 1.28* 1.25* 0.99  CALCIUM 9.1 9.0 9.0 8.5* 8.9  MG  --  1.8  --  2.0 1.9  PHOS  --   --   --  3.1 2.8   Estimated Creatinine Clearance: 48.3 mL/min (by C-G formula based on SCr of 0.99 mg/dL). Liver & Pancreas: Recent Labs  Lab 03/02/21 0926 03/04/21 0048 03/05/21 0215  AST 16  --   --   ALT 20  --   --   ALKPHOS 53  --   --   BILITOT 1.1  --   --   PROT 6.3*  --   --   ALBUMIN 3.1* 2.5* 2.7*   No results for input(s): LIPASE, AMYLASE in the last 168 hours. Recent Labs  Lab 03/04/21 0048  AMMONIA <10   Diabetic: No results for input(s): HGBA1C in the last 72 hours. Recent Labs  Lab 03/05/21 0837 03/05/21 1155 03/05/21 1712 03/05/21 2044 03/06/21 0739  GLUCAP 91 108* 129* 128* 110*   Cardiac Enzymes: Recent Labs  Lab 03/04/21 0048  CKTOTAL 238   No results for input(s): PROBNP in the last 8760 hours. Coagulation Profile: Recent Labs  Lab 03/02/21 0926  INR 1.2   Thyroid Function Tests: Recent Labs    03/05/21 0215  TSH 2.069   Lipid Profile: No results for input(s): CHOL, HDL, LDLCALC, TRIG, CHOLHDL, LDLDIRECT in the last 72 hours. Anemia Panel: Recent Labs    03/05/21 0215  VITAMINB12 359  FOLATE 4.4*  FERRITIN 135  TIBC 224*  IRON 23*  RETICCTPCT 1.9   Urine analysis:    Component Value Date/Time   COLORURINE YELLOW 10/01/2016 2326   APPEARANCEUR CLEAR 10/01/2016 2326   LABSPEC 1.015 10/01/2016 2326   PHURINE 5.0 10/01/2016 2326   GLUCOSEU NEGATIVE 10/01/2016 2326   HGBUR NEGATIVE 10/01/2016 2326   BILIRUBINUR NEGATIVE 10/01/2016 2326   KETONESUR  NEGATIVE 10/01/2016 2326   PROTEINUR NEGATIVE 10/01/2016 2326   UROBILINOGEN 0.2 11/13/2012 1238   NITRITE NEGATIVE 10/01/2016 2326   LEUKOCYTESUR  NEGATIVE 10/01/2016 2326   Sepsis Labs: Invalid input(s): PROCALCITONIN, LACTICIDVEN   Time coordinating discharge: 45 minutes  SIGNED:  Mercy Riding, MD  [facility] 03/06/2021, 10:14 AM

## 2021-08-04 DEATH — deceased
